# Patient Record
Sex: Female | Born: 1955 | Race: Black or African American | Hispanic: No | Marital: Married | Smoking: Never smoker
Health system: Southern US, Community
[De-identification: ages and names within clinical notes are randomized; demographics above are authoritative.]

## PROBLEM LIST (undated history)

## (undated) DIAGNOSIS — I10 Essential (primary) hypertension: Secondary | ICD-10-CM

## (undated) DIAGNOSIS — E119 Type 2 diabetes mellitus without complications: Secondary | ICD-10-CM

## (undated) DIAGNOSIS — C541 Malignant neoplasm of endometrium: Secondary | ICD-10-CM

## (undated) HISTORY — PX: TUBAL LIGATION: SHX77

## (undated) HISTORY — DX: Type 2 diabetes mellitus without complications: E11.9

## (undated) HISTORY — PX: ABDOMINAL HYSTERECTOMY: SHX81

---

## 2013-08-17 ENCOUNTER — Emergency Department (HOSPITAL_COMMUNITY)
Admission: EM | Admit: 2013-08-17 | Discharge: 2013-08-17 | Disposition: A | Discharge status: Home / Self Care | Payer: Medicaid Other | Source: Home / Self Care | Attending: Emergency Medicine | Admitting: Emergency Medicine

## 2013-08-17 ENCOUNTER — Encounter (HOSPITAL_COMMUNITY): Payer: Self-pay | Admitting: Emergency Medicine

## 2013-08-17 ENCOUNTER — Ambulatory Visit (HOSPITAL_COMMUNITY): Payer: Self-pay | Attending: Emergency Medicine

## 2013-08-17 DIAGNOSIS — M25559 Pain in unspecified hip: Secondary | ICD-10-CM

## 2013-08-17 MED ORDER — TRAMADOL HCL 50 MG PO TABS: 50.0000 mg | ORAL_TABLET | Freq: Four times a day (QID) | ORAL | Status: DC | PRN

## 2013-08-17 MED ORDER — METHYLPREDNISOLONE ACETATE 40 MG/ML IJ SUSP
40.0000 mg | Freq: Once | INTRAMUSCULAR | Status: AC
  Administered 2013-08-17: 40 mg via INTRA_ARTICULAR

## 2013-08-17 MED ORDER — NAPROXEN 500 MG PO TABS: 500.0000 mg | ORAL_TABLET | Freq: Two times a day (BID) | ORAL | Status: DC

## 2013-08-17 MED ORDER — METHYLPREDNISOLONE ACETATE 40 MG/ML IJ SUSP
INTRAMUSCULAR | Status: AC
  Filled 2013-08-17: qty 5

## 2013-08-17 MED ORDER — KETOROLAC TROMETHAMINE 60 MG/2ML IM SOLN
60.0000 mg | Freq: Once | INTRAMUSCULAR | Status: AC
  Administered 2013-08-17: 60 mg via INTRAMUSCULAR

## 2013-08-17 MED ORDER — KETOROLAC TROMETHAMINE 60 MG/2ML IM SOLN
INTRAMUSCULAR | Status: AC
  Filled 2013-08-17: qty 2

## 2013-08-17 NOTE — Discharge Instructions (Signed)
Hip Pain  The hips join the upper legs to the lower pelvis. The bones, cartilage, tendons, and muscles of the hip joint perform a lot of work each day holding your body weight and allowing you to move around.  Hip pain is a common symptom. It can range from a minor ache to severe pain on 1 or both hips. Pain may be felt on the inside of the hip joint near the groin, or the outside near the buttocks and upper thigh. There may be swelling or stiffness as well. It occurs more often when a person walks or performs activity. There are many reasons hip pain can develop.  CAUSES   It is important to work with your caregiver to identify the cause since many conditions can impact the bones, cartilage, muscles, and tendons of the hips. Causes for hip pain include:   Broken (fractured) bones.   Separation of the thighbone from the hip socket (dislocation).   Torn cartilage of the hip joint.   Swelling (inflammation) of a tendon (tendonitis), the sac within the hip joint (bursitis), or a joint.   A weakening in the abdominal wall (hernia), affecting the nerves to the hip.   Arthritis in the hip joint or lining of the hip joint.   Pinched nerves in the back, hip, or upper thigh.   A bulging disc in the spine (herniated disc).   Rarely, bone infection or cancer.  DIAGNOSIS   The location of your hip pain will help your caregiver understand what may be causing the pain. A diagnosis is based on your medical history, your symptoms, results from your physical exam, and results from diagnostic tests. Diagnostic tests may include X-ray exams, a computerized magnetic scan (magnetic resonance imaging, MRI), or bone scan.  TREATMENT   Treatment will depend on the cause of your hip pain. Treatment may include:   Limiting activities and resting until symptoms improve.   Crutches or other walking supports (a cane or brace).   Ice, elevation, and compression.   Physical therapy or home exercises.   Shoe inserts or special  shoes.   Losing weight.   Medications to reduce pain.   Undergoing surgery.  HOME CARE INSTRUCTIONS    Only take over-the-counter or prescription medicines for pain, discomfort, or fever as directed by your caregiver.   Put ice on the injured area:   Put ice in a plastic bag.   Place a towel between your skin and the bag.   Leave the ice on for 15-20 minutes at a time, 03-04 times a day.   Keep your leg raised (elevated) when possible to lessen swelling.   Avoid activities that cause pain.   Follow specific exercises as directed by your caregiver.   Sleep with a pillow between your legs on your most comfortable side.   Record how often you have hip pain, the location of the pain, and what it feels like. This information may be helpful to you and your caregiver.   Ask your caregiver about returning to work or sports and whether you should drive.   Follow up with your caregiver for further exams, therapy, or testing as directed.  SEEK MEDICAL CARE IF:    Your pain or swelling continues or worsens after 1 week.   You are feeling unwell or have chills.   You have increasing difficulty with walking.   You have a loss of sensation or other new symptoms.   You have questions or concerns.  SEEK   IMMEDIATE MEDICAL CARE IF:    You cannot put weight on the affected hip.   You have fallen.   You have a sudden increase in pain and swelling in your hip.   You have a fever.  MAKE SURE YOU:    Understand these instructions.   Will watch your condition.   Will get help right away if you are not doing well or get worse.  Document Released: 10/11/2009 Document Revised: 07/16/2011 Document Reviewed: 10/11/2009  [employer] Patient Information 2014 [employer].

## 2013-08-17 NOTE — ED Provider Notes (Signed)
Medical screening examination/treatment/procedure(s) were performed by non-physician practitioner and as supervising physician I was immediately available for consultation/collaboration.  Philipp Deputy, M.D.   Harden Mo, MD 08/17/13 [phone]

## 2013-08-17 NOTE — ED Provider Notes (Signed)
CSN: [id number]     Arrival date & time 08/17/13  0840 History   First MD Initiated Contact with Patient 08/17/13 0915     Chief Complaint  Patient presents with  . Hip Pain   (Consider location/radiation/quality/duration/timing/severity/associated sxs/prior Treatment) HPI Comments: 58 year old female presents complaining of left hip pain that began on Saturday. She has never had this before and she had no inciting injury. She just started to feel pain in her hip that is worse with rising from a seated position. She feels the pain in the left buttock around the left side of her leg and into her left groin. She has no history of arthritis. Pain is constant no matter what position she is in but just gets worse when she tries to stand on that leg. She also has some low back pain but it is actually better than it has been in the past. She denies any numbness, loss of bowel or bladder control.  Patient is a 58 y.o. female presenting with hip pain.  Hip Pain    History reviewed. No pertinent past medical history. History reviewed. No pertinent past surgical history. History reviewed. No pertinent family history. History  Substance Use Topics  . Smoking status: Never Smoker   . Smokeless tobacco: Not on file  . Alcohol Use: No   OB History   Grav Para Term Preterm Abortions TAB SAB Ect Mult Living                 Review of Systems  Musculoskeletal: Positive for arthralgias (left hip pain) and back pain.  All other systems reviewed and are negative.   Allergies  Review of patient's allergies indicates no known allergies.  Home Medications   Current Outpatient Rx  Name  Route  Sig  Dispense  Refill  . naproxen (NAPROSYN) 500 MG tablet   Oral   Take 1 tablet (500 mg total) by mouth 2 (two) times daily.   60 tablet   0   . traMADol (ULTRAM) 50 MG tablet   Oral   Take 1 tablet (50 mg total) by mouth every 6 (six) hours as needed.   15 tablet   0    BP 179/79  Pulse 58   Temp(Src) 97.8 F (36.6 C) (Oral)  Resp 20  SpO2 100% Physical Exam  Nursing note and vitals reviewed. Constitutional: She is oriented to person, place, and time. Vital signs are normal. She appears well-developed and well-nourished. No distress.  Obese habitus  HENT:  Head: Normocephalic and atraumatic.  Pulmonary/Chest: Effort normal. No respiratory distress.  Musculoskeletal:       Left hip: She exhibits decreased range of motion and tenderness (lateral hip over her trochanteric bursa). She exhibits normal strength and no swelling.  Neurological: She is alert and oriented to person, place, and time. She has normal strength and normal reflexes. No sensory deficit. Gait (antalgic, favoring left) abnormal. Coordination normal.  Skin: Skin is warm and dry. No rash noted. She is not diaphoretic.  Psychiatric: She has a normal mood and affect. Judgment normal.    ED Course  Procedures (including critical care time) Labs Review Labs Reviewed - No data to display Imaging Review Dg Hip Complete Left  08/17/2013   CLINICAL DATA:  Left hip pain.  EXAM: LEFT HIP - COMPLETE 2+ VIEW  COMPARISON:  None.  FINDINGS: There is no evidence of hip fracture or dislocation. There is no evidence of arthropathy or other focal bone abnormality.  IMPRESSION: Normal  left hip.   Electronically Signed   By: Sabino Dick M.D.   On: 08/17/2013 10:54    940: Given trochanteric bursa injection with 40 mg of Depo-Medrol and 3 mL of 2% lidocaine. After 20 minutes, this is only partially relieved her pain but she still has pain in the groin and buttock. Will obtain hip x-rays. Will give 60 mg of Toradol as well.   MDM   1. Hip pain    XR normal.  Probable mild arthritis, hopefully will improve with NSAIDs and weight loss.  F/u with ortho Dr. Percell Miller if this does not improve    Meds ordered this encounter  Medications  . methylPREDNISolone acetate (DEPO-MEDROL) injection 40 mg    Sig:   . ketorolac (TORADOL)  injection 60 mg    Sig:   . naproxen (NAPROSYN) 500 MG tablet    Sig: Take 1 tablet (500 mg total) by mouth 2 (two) times daily.    Dispense:  60 tablet    Refill:  0    Order Specific Question:  Supervising Provider    Answer:  Jake Michaelis, DAVID C [id number]  . traMADol (ULTRAM) 50 MG tablet    Sig: Take 1 tablet (50 mg total) by mouth every 6 (six) hours as needed.    Dispense:  15 tablet    Refill:  0    Order Specific Question:  Supervising Provider    Answer:  Jake Michaelis, DAVID C [[id number]]       Liam Graham, PA-C 08/17/13 1100

## 2013-08-17 NOTE — ED Notes (Signed)
C/o  Left hip pain since Saturday.  Denies injury.  Severe pain upon standing after lying.  Pt tried aleve with no relief.

## 2020-02-23 ENCOUNTER — Encounter: Payer: Self-pay | Admitting: Emergency Medicine

## 2020-02-23 ENCOUNTER — Other Ambulatory Visit: Payer: Self-pay

## 2020-02-23 ENCOUNTER — Ambulatory Visit
Admission: EM | Admit: 2020-02-23 | Discharge: 2020-02-23 | Disposition: A | Discharge status: Home / Self Care | Payer: Self-pay

## 2020-02-23 DIAGNOSIS — R319 Hematuria, unspecified: Secondary | ICD-10-CM

## 2020-02-23 DIAGNOSIS — R300 Dysuria: Secondary | ICD-10-CM

## 2020-02-23 DIAGNOSIS — R739 Hyperglycemia, unspecified: Secondary | ICD-10-CM

## 2020-02-23 DIAGNOSIS — M545 Low back pain, unspecified: Secondary | ICD-10-CM

## 2020-02-23 DIAGNOSIS — R1030 Lower abdominal pain, unspecified: Secondary | ICD-10-CM

## 2020-02-23 HISTORY — DX: Essential (primary) hypertension: I10

## 2020-02-23 LAB — POCT FASTING CBG KUC MANUAL ENTRY: POCT Glucose (KUC): 146 mg/dL — AB (ref 70–99)

## 2020-02-23 LAB — POCT URINALYSIS DIP (MANUAL ENTRY)
Glucose, UA: NEGATIVE mg/dL
Leukocytes, UA: NEGATIVE
Nitrite, UA: NEGATIVE
Protein Ur, POC: 100 mg/dL — AB
Spec Grav, UA: >=1.03 — AB (ref 1.010–1.025)
Urobilinogen, UA: 0.2 E.U./dL
pH, UA: 5.5 (ref 5.0–8.0)

## 2020-02-23 MED ORDER — KETOROLAC TROMETHAMINE 30 MG/ML IJ SOLN
30.0000 mg | Freq: Once | INTRAMUSCULAR | Status: AC
  Administered 2020-02-23: 30 mg via INTRAMUSCULAR

## 2020-02-23 NOTE — Discharge Instructions (Addendum)
Will call you with blood results - we may end up starting a medication. If your kidney function is poor you may need to go to the hospital for further treatment. Go to ER if you have worse pain, vomiting, fever, black/blood in stool. Important to schedule follow up with PCP as well as urology (pee/bladder doctor).

## 2020-02-23 NOTE — ED Triage Notes (Signed)
Pt here for right sided groin/lower abd pain worse with urination; pt sts noted blood in urine x 1 week; pt sts urinary frequency

## 2020-02-23 NOTE — ED Provider Notes (Signed)
[name]    CSN: [id number] Arrival date & time: 02/23/20  0902      History   Chief Complaint Chief Complaint  Patient presents with  . Dysuria  . Hematuria    HPI Amanda Lin is a 64 y.o. female  With history of obesity, hypertension presenting for bilateral lower abdominal pain that is worse with urination.  States she is noted blood in her urine off and on for the last week.  Denying vaginal discharge or pelvic pain, nausea, vomiting.  No upper abdominal pain, reflux.  Is s/p cholecystectomy.  No change in bowel habit: Last BM was last night and normal for her.  No blood or melena.  Denies personal history diabetes, though endorsing family history.    Past Medical History:  Diagnosis Date  . Hypertension     There are no problems to display for this patient.   History reviewed. No pertinent surgical history.  OB History   No obstetric history on file.      Home Medications    Prior to Admission medications   Medication Sig Start Date End Date Taking? Authorizing Provider  losartan (COZAAR) 25 MG tablet Take 25 mg by mouth daily.   Yes [provider]  naproxen (NAPROSYN) 500 MG tablet Take 1 tablet (500 mg total) by mouth 2 (two) times daily. 08/17/13   Amanda Graham, PA-C    Family History Family History  Family history unknown: Yes    Social History Social History   Tobacco Use  . Smoking status: Never Smoker  . Smokeless tobacco: Never Used  Substance Use Topics  . Alcohol use: No  . Drug use: No     Allergies   Patient has no known allergies.   Review of Systems As per HPI   Physical Exam Triage Vital Signs ED Triage Vitals  Enc Vitals Group     BP      Pulse      Resp      Temp      Temp src      SpO2      Weight      Height      Head Circumference      Peak Flow      Pain Score      Pain Loc      Pain Edu?      Excl. in [place]?    No data found.  Updated Vital Signs BP (!) 178/91  (BP Location: Left Arm)   Pulse 79   Temp 98.3 F (36.8 C) (Oral)   Resp 18   SpO2 97%   Visual Acuity Right Eye Distance:   Left Eye Distance:   Bilateral Distance:    Right Eye Near:   Left Eye Near:    Bilateral Near:     Physical Exam Constitutional:      General: She is not in acute distress. HENT:     Head: Normocephalic and atraumatic.  Eyes:     General: No scleral icterus.    Pupils: Pupils are equal, round, and reactive to light.  Cardiovascular:     Rate and Rhythm: Normal rate.  Pulmonary:     Effort: Pulmonary effort is normal.  Abdominal:     General: Bowel sounds are normal.     Palpations: Abdomen is soft.     Tenderness: There is no abdominal tenderness. There is no right CVA tenderness, left CVA tenderness or guarding.  Skin:  Coloration: Skin is not jaundiced or pale.  Neurological:     Mental Status: She is alert and oriented to person, place, and time.      UC Treatments / Results  Labs (all labs ordered are listed, but only abnormal results are displayed) Labs Reviewed  POCT URINALYSIS DIP (MANUAL ENTRY) - Abnormal; Notable for the following components:      Result Value   Clarity, UA cloudy (*)    Bilirubin, UA moderate (*)    Ketones, POC UA large (80) (*)    Spec Grav, UA >=1.030 (*)    Blood, UA large (*)    Protein Ur, POC =100 (*)    All other components within normal limits  POCT FASTING CBG [facility] MANUAL ENTRY - Abnormal; Notable for the following components:   POCT Glucose ([facility]) 146 (*)    All other components within normal limits  COMPREHENSIVE METABOLIC PANEL  HEMOGLOBIN A1C  CBC WITH DIFFERENTIAL/PLATELET    EKG   Radiology No results found.  Procedures Procedures (including critical care time)  Medications Ordered in UC Medications  ketorolac (TORADOL) 30 MG/ML injection 30 mg (30 mg Intramuscular Given 02/23/20 1027)    Initial Impression / Assessment and Plan / UC Course  I have reviewed the triage vital  signs and the nursing notes.  Pertinent labs & imaging results that were available during my care of the patient were reviewed by me and considered in my medical decision making (see chart for details).     Afebrile, nontoxic in office today.  Urine dipstick with abnormalities as above.  CBG 146 (fasting).  Low concern for infectious process at this time.  Will obtain basic lab work, start Metformin if A1c, sugars elevated and kidney function ok.  F/u w/ PCP.  ER return precautions discussed, pt verbalized understanding and is agreeable to plan.  Final Clinical Impressions(s) / UC Diagnoses   Final diagnoses:  Hyperglycemia  Dysuria  Hematuria, unspecified type  Acute bilateral low back pain without sciatica  Lower abdominal pain     Discharge Instructions     Will call you with blood results - we may end up starting a medication. If your kidney function is poor you may need to go to the hospital for further treatment. Go to ER if you have worse pain, vomiting, fever, black/blood in stool. Important to schedule follow up with PCP as well as urology (pee/bladder doctor).    ED Prescriptions    None     PDMP not reviewed this encounter.   Amanda Lin, [place], [place] 02/23/20 1307

## 2020-02-25 LAB — COMPREHENSIVE METABOLIC PANEL
ALT: 19 IU/L (ref 0–32)
AST: 14 IU/L (ref 0–40)
Albumin/Globulin Ratio: 1.3 (ref 1.2–2.2)
Albumin: 4.4 g/dL (ref 3.8–4.8)
Alkaline Phosphatase: 115 IU/L (ref 44–121)
BUN/Creatinine Ratio: 19 (ref 12–28)
BUN: 15 mg/dL (ref 8–27)
Bilirubin Total: 0.6 mg/dL (ref 0.0–1.2)
CO2: 19 mmol/L — ABNORMAL LOW (ref 20–29)
Calcium: 9.6 mg/dL (ref 8.7–10.3)
Chloride: 102 mmol/L (ref 96–106)
Creatinine, Ser: 0.8 mg/dL (ref 0.57–1.00)
GFR calc Af Amer: 91 mL/min/{1.73_m2} (ref >59)
GFR calc non Af Amer: 79 mL/min/{1.73_m2} (ref >59)
Globulin, Total: 3.4 g/dL (ref 1.5–4.5)
Glucose: 138 mg/dL — ABNORMAL HIGH (ref 65–99)
Potassium: 4.2 mmol/L (ref 3.5–5.2)
Sodium: 138 mmol/L (ref 134–144)
Total Protein: 7.8 g/dL (ref 6.0–8.5)

## 2020-02-25 LAB — CBC WITH DIFFERENTIAL/PLATELET
Basophils Absolute: 0 10*3/uL (ref 0.0–0.2)
Basos: 0 %
EOS (ABSOLUTE): 0.1 10*3/uL (ref 0.0–0.4)
Eos: 1 %
Hematocrit: 45.1 % (ref 34.0–46.6)
Hemoglobin: 15.1 g/dL (ref 11.1–15.9)
Immature Grans (Abs): 0 10*3/uL (ref 0.0–0.1)
Immature Granulocytes: 0 %
Lymphocytes Absolute: 1.7 10*3/uL (ref 0.7–3.1)
Lymphs: 18 %
MCH: 27.7 pg (ref 26.6–33.0)
MCHC: 33.5 g/dL (ref 31.5–35.7)
MCV: 83 fL (ref 79–97)
Monocytes Absolute: 0.8 10*3/uL (ref 0.1–0.9)
Monocytes: 8 %
Neutrophils Absolute: 7 10*3/uL (ref 1.4–7.0)
Neutrophils: 73 %
Platelets: 292 10*3/uL (ref 150–450)
RBC: 5.46 x10E6/uL — ABNORMAL HIGH (ref 3.77–5.28)
RDW: 12.6 % (ref 11.7–15.4)
WBC: 9.6 10*3/uL (ref 3.4–10.8)

## 2020-02-25 LAB — HEMOGLOBIN A1C
Est. average glucose Bld gHb Est-mCnc: 180 mg/dL
Hgb A1c MFr Bld: 7.9 % — ABNORMAL HIGH (ref 4.8–5.6)

## 2020-02-26 ENCOUNTER — Emergency Department (HOSPITAL_COMMUNITY)
Admission: EM | Admit: 2020-02-26 | Discharge: 2020-02-26 | Disposition: A | Discharge status: Home / Self Care | Payer: Self-pay | Attending: Emergency Medicine | Admitting: Emergency Medicine

## 2020-02-26 ENCOUNTER — Other Ambulatory Visit: Payer: Self-pay

## 2020-02-26 ENCOUNTER — Encounter (HOSPITAL_COMMUNITY): Payer: Self-pay | Admitting: Pharmacy Technician

## 2020-02-26 ENCOUNTER — Emergency Department (HOSPITAL_COMMUNITY): Payer: Self-pay

## 2020-02-26 DIAGNOSIS — R319 Hematuria, unspecified: Secondary | ICD-10-CM | POA: Diagnosis present

## 2020-02-26 DIAGNOSIS — Z79899 Other long term (current) drug therapy: Secondary | ICD-10-CM | POA: Insufficient documentation

## 2020-02-26 DIAGNOSIS — E119 Type 2 diabetes mellitus without complications: Secondary | ICD-10-CM | POA: Insufficient documentation

## 2020-02-26 DIAGNOSIS — N2889 Other specified disorders of kidney and ureter: Secondary | ICD-10-CM | POA: Insufficient documentation

## 2020-02-26 DIAGNOSIS — N200 Calculus of kidney: Secondary | ICD-10-CM | POA: Insufficient documentation

## 2020-02-26 DIAGNOSIS — I10 Essential (primary) hypertension: Secondary | ICD-10-CM | POA: Insufficient documentation

## 2020-02-26 LAB — CBC
HCT: 45.3 % (ref 36.0–46.0)
Hemoglobin: 14.4 g/dL (ref 12.0–15.0)
MCH: 27.4 pg (ref 26.0–34.0)
MCHC: 31.8 g/dL (ref 30.0–36.0)
MCV: 86.3 fL (ref 80.0–100.0)
Platelets: 391 10*3/uL (ref 150–400)
RBC: 5.25 MIL/uL — ABNORMAL HIGH (ref 3.87–5.11)
RDW: 12.8 % (ref 11.5–15.5)
WBC: 7.9 10*3/uL (ref 4.0–10.5)
nRBC: 0 % (ref 0.0–0.2)

## 2020-02-26 LAB — COMPREHENSIVE METABOLIC PANEL
ALT: 15 U/L (ref 0–44)
AST: 23 U/L (ref 15–41)
Albumin: 3.7 g/dL (ref 3.5–5.0)
Alkaline Phosphatase: 83 U/L (ref 38–126)
Anion gap: 10 (ref 5–15)
BUN: 12 mg/dL (ref 8–23)
CO2: 25 mmol/L (ref 22–32)
Calcium: 9.3 mg/dL (ref 8.9–10.3)
Chloride: 101 mmol/L (ref 98–111)
Creatinine, Ser: 0.72 mg/dL (ref 0.44–1.00)
GFR, Estimated: >60 mL/min (ref >60)
Glucose, Bld: 136 mg/dL — ABNORMAL HIGH (ref 70–99)
Potassium: 4.3 mmol/L (ref 3.5–5.1)
Sodium: 136 mmol/L (ref 135–145)
Total Bilirubin: 1.3 mg/dL — ABNORMAL HIGH (ref 0.3–1.2)
Total Protein: 7.9 g/dL (ref 6.5–8.1)

## 2020-02-26 LAB — URINALYSIS, ROUTINE W REFLEX MICROSCOPIC
Bacteria, UA: NONE SEEN
Bilirubin Urine: NEGATIVE
Glucose, UA: NEGATIVE mg/dL
Ketones, ur: NEGATIVE mg/dL
Leukocytes,Ua: NEGATIVE
Nitrite: NEGATIVE
Protein, ur: NEGATIVE mg/dL
Specific Gravity, Urine: 1.001 — ABNORMAL LOW (ref 1.005–1.030)
pH: 5 (ref 5.0–8.0)

## 2020-02-26 LAB — LIPASE, BLOOD: Lipase: 36 U/L (ref 11–51)

## 2020-02-26 NOTE — ED Provider Notes (Signed)
[facility] Provider Note   CSN: [id number] Arrival date & time: 02/26/20  0944     History Chief Complaint  Patient presents with  . Abdominal Pain    Amanda Lin is a 64 y.o. female with past medical history of hypertension, diabetes who presents to the ED for hematuria, dysuria and bilateral lower abdominal pain.  Patient states that for some started a few days ago which she went to an urgent care on 10/19/121.  CBC and CMP were unremarkable.  UA shows large blood with negative nitrite and leukocyte esterase.  She was not given antibiotics at that time.  Patient states that since then her hematuria and dysuria has improved but not resolved.  She still have the bilateral lower abdominal pain but also improved.  She denies fever, flank pain, urgency, nausea, or vomiting.  Patient is postmenopausal and does not have menstrual cycle.  She is not sexually active.  She denies any radiation in her pelvic area.  HPI     Past Medical History:  Diagnosis Date  . Hypertension     Patient Active Problem List   Diagnosis Date Noted  . Right renal mass 02/26/2020  . Kidney stone 02/26/2020  . Hematuria 02/26/2020    Past Surgical History:  Procedure Laterality Date  . ABDOMINAL HYSTERECTOMY     pt has had tubes tied     OB History   No obstetric history on file.     Family History  Family history unknown: Yes    Social History   Tobacco Use  . Smoking status: Never Smoker  . Smokeless tobacco: Never Used  Substance Use Topics  . Alcohol use: Yes    Comment: ocassionaly   . Drug use: No    Home Medications Prior to Admission medications   Medication Sig Start Date End Date Taking? Authorizing Provider  losartan (COZAAR) 25 MG tablet Take 25 mg by mouth daily.    [provider]  naproxen (NAPROSYN) 500 MG tablet Take 1 tablet (500 mg total) by mouth 2 (two) times daily. 08/17/13   Liam Graham, PA-C     Allergies    Patient has no known allergies.  Review of Systems   Review of Systems  Constitutional: Positive for chills. Negative for fever.  Respiratory: Negative for shortness of breath.   Cardiovascular: Negative for chest pain.  Gastrointestinal: Positive for abdominal pain. Negative for nausea and vomiting.  Genitourinary: Positive for difficulty urinating, dysuria and hematuria. Negative for flank pain and urgency.  Musculoskeletal: Positive for back pain.    Physical Exam Updated Vital Signs BP (!) 191/76 (BP Location: Right Arm)   Pulse 72   Temp 98.3 F (36.8 C) (Oral)   Resp 15   Ht 5\' 1"  (1.549 m)   Wt 117.9 kg   SpO2 99%   BMI 49.13 kg/m   Physical Exam Constitutional:      General: She is not in acute distress.    Appearance: She is obese. She is not toxic-appearing.  HENT:     Head: Normocephalic.  Eyes:     General:        Right eye: No discharge.        Left eye: No discharge.  Cardiovascular:     Rate and Rhythm: Normal rate and regular rhythm.  Pulmonary:     Effort: No respiratory distress.     Breath sounds: Normal breath sounds. No wheezing.  Abdominal:  General: Bowel sounds are normal.     Palpations: Abdomen is soft.     Tenderness: There is no right CVA tenderness or left CVA tenderness.     Comments: Tenderness to palpation of bilateral lower abdomen radiates down to her groin.  Also suprapubic tenderness to palpation.  Musculoskeletal:     Right lower leg: No edema.     Left lower leg: No edema.  Skin:    General: Skin is warm.     Coloration: Skin is not jaundiced.  Neurological:     Mental Status: She is alert.  Psychiatric:        Mood and Affect: Mood normal.     ED Results / Procedures / Treatments   Labs (all labs ordered are listed, but only abnormal results are displayed) Labs Reviewed  COMPREHENSIVE METABOLIC PANEL - Abnormal; Notable for the following components:      Result Value   Glucose, Bld 136 (*)     Total Bilirubin 1.3 (*)    All other components within normal limits  CBC - Abnormal; Notable for the following components:   RBC 5.25 (*)    All other components within normal limits  URINALYSIS, ROUTINE W REFLEX MICROSCOPIC - Abnormal; Notable for the following components:   Specific Gravity, Urine 1.001 (*)    Hgb urine dipstick MODERATE (*)    All other components within normal limits  LIPASE, BLOOD    EKG None  Radiology CT Renal Stone Study  Result Date: 02/26/2020 CLINICAL DATA:  Hematuria and lower abdominal pain. EXAM: CT ABDOMEN AND PELVIS WITHOUT CONTRAST TECHNIQUE: Multidetector CT imaging of the abdomen and pelvis was performed following the standard protocol without IV contrast. COMPARISON:  None. FINDINGS: Lower chest: No acute abnormality. Hepatobiliary: No focal liver abnormality is seen. Status post cholecystectomy. No biliary dilatation. Pancreas: Unremarkable. No pancreatic ductal dilatation or surrounding inflammatory changes. Spleen: Normal in size without focal abnormality. Adrenals/Urinary Tract: Adrenal glands are unremarkable. Kidneys are normal in size. A 1.3 cm diameter exophytic hyperdense area is seen along the lower pole of the right kidney. A 2.1 cm x 1.8 cm x 2.5 cm slightly heterogeneous low-attenuation mass is seen along the anterior aspect of the lower pole of the left kidney. A 3 mm nonobstructing renal stone is seen within the left kidney. The urinary bladder is contracted and subsequently limited in evaluation. Stomach/Bowel: Stomach is within normal limits. Appendix appears normal. No evidence of bowel wall thickening, distention, or inflammatory changes. Noninflamed diverticula are seen within the descending and sigmoid colon. Vascular/Lymphatic: No significant vascular findings are present. No enlarged abdominal or pelvic lymph nodes. Reproductive: The uterus is mildly enlarged, with multiple round, exophytic isodense and mildly hyperdense uterine soft  tissue mass is seen. A small area of parenchymal calcification is also seen within the uterus. Other: There is a small lobulated fat containing umbilical hernia. No abdominopelvic ascites. Musculoskeletal: No acute or significant osseous findings. IMPRESSION: 1. 3 mm nonobstructing renal stone within the left kidney. 2. Findings suggestive of a small hemorrhagic cyst within the lower pole of the right kidney, with an anterior right lower pole heterogeneous renal soft tissue mass. MRI correlation is recommended, as an underlying neoplastic process cannot be excluded. 3. Colonic diverticulosis. 4. Multiple exophytic uterine fibroids. 5. Evidence of prior cholecystectomy. Electronically Signed   By: Virgina Norfolk M.D.   On: 02/26/2020 18:57    Procedures Procedures (including critical care time)  Medications Ordered in ED Medications - No data to  display  ED Course  I have reviewed the triage vital signs and the nursing notes.  Pertinent labs & imaging results that were available during my care of the patient were reviewed by me and considered in my medical decision making (see chart for details).  Patient seen and examined.  Physical exam remarkable for bilateral lower abdominal pain bradycardia down to the groin with suprapubic tenderness.  No CVA tenderness.  CBC and CMP were unremarkable and negative for infectious process.  Lipase was negative.  UA shows moderate hemoglobin with negative nitrite and leukocyte esterase.  Differentials include renal cholelithiasis, malignancy or interstitial cystitis.  Will obtain CT renal stone.   CT shows a 3 mm nonobstructing stone in the left kidney.  Right kidney shows a hemorrhagic cyst at the lower pole and a small soft tissue mass anteriorly.  Recommend MRI follow-up.  Her source of bleeding likely come from these findings.  Patient will be discharged and follow-up with a urologist and proceed with an MRI outpatient.    MDM Rules/Calculators/A&P                           Patient presented to the ED for ongoing hematuria and abdominal pain radiates to groin for the duration of 1 week.  No CVA tenderness to palpation on exam.  CBC and CMP were unremarkable.  Lipase was negative.  UA is negative for nitrite or leukocyte esterase but showed moderate hemoglobin.  CT renal stone was ordered which show a 3 mm nonobstructing stone in the left kidney.  Right kidney shows a hemorrhagic cyst at the lower pole and a small soft tissue mass anteriorly.  Her hematuria likely come from these findings.  Her abdominal pain may be due to passing a kidney stone.  Patient will follow up with a urologist and obtain an MRI outpatient for further evaluation.  She will need to find a PCP of her choice.  Come back to ED for worsening pain or bleeding.  Patient agrees with the plan.  Final Clinical Impression(s) / ED Diagnoses Final diagnoses:  Hematuria, unspecified type  Kidney stone  Right renal mass    Rx / DC Orders ED Discharge Orders    None       Gaylan Gerold, DO 02/26/20 1949    Blanchie Dessert, MD 02/26/20 2311

## 2020-02-26 NOTE — Discharge Instructions (Addendum)
Ms. Khun, you came to the ED for blood in urine and abdominal pain.  CT scan shows a small stone in your left kidney.  The right kidney has a small blood containing cyst and a soft tissue mass.  [facility] for further evaluation.  Please drink plenty of fluid.  Please find a primary physician of your choice.  Come back to the ED for worsening pain or bleeding.  Take care  CT scan IMPRESSION: 1. 3 mm nonobstructing renal stone within the left kidney. 2. Findings suggestive of a small hemorrhagic cyst within the lower pole of the right kidney, with an anterior right lower pole heterogeneous renal soft tissue mass. MRI correlation is recommended, as an underlying neoplastic process cannot be excluded. 3. Colonic diverticulosis. 4. Multiple exophytic uterine fibroids. 5. Evidence of prior cholecystectomy.

## 2020-02-26 NOTE — ED Triage Notes (Signed)
Pt here from UC with reports of hematuria, bil lower abd pain and burning with urination. Taking AZO for pain relief. Pt states given "shot" at [facility] but not sure what they gave her.

## 2020-03-17 ENCOUNTER — Other Ambulatory Visit: Payer: Self-pay | Admitting: Urology

## 2020-03-17 DIAGNOSIS — R310 Gross hematuria: Secondary | ICD-10-CM

## 2020-03-17 DIAGNOSIS — D49511 Neoplasm of unspecified behavior of right kidney: Secondary | ICD-10-CM

## 2020-03-28 ENCOUNTER — Ambulatory Visit (HOSPITAL_COMMUNITY)
Admission: RE | Admit: 2020-03-28 | Discharge: 2020-03-28 | Disposition: A | Discharge status: Home / Self Care | Payer: Self-pay | Source: Ambulatory Visit | Attending: Urology | Admitting: Urology

## 2020-03-28 ENCOUNTER — Encounter (HOSPITAL_COMMUNITY): Payer: Self-pay

## 2020-03-28 ENCOUNTER — Other Ambulatory Visit: Payer: Self-pay

## 2020-03-28 DIAGNOSIS — D49511 Neoplasm of unspecified behavior of right kidney: Secondary | ICD-10-CM | POA: Insufficient documentation

## 2020-03-28 DIAGNOSIS — R310 Gross hematuria: Secondary | ICD-10-CM | POA: Insufficient documentation

## 2020-03-28 MED ORDER — IOHEXOL 300 MG/ML  SOLN
100.0000 mL | Freq: Once | INTRAMUSCULAR | Status: AC | PRN
  Administered 2020-03-28: 100 mL via INTRAVENOUS

## 2020-04-04 ENCOUNTER — Encounter (HOSPITAL_COMMUNITY): Payer: Self-pay

## 2020-04-04 ENCOUNTER — Other Ambulatory Visit: Payer: Self-pay

## 2020-04-04 ENCOUNTER — Emergency Department (HOSPITAL_COMMUNITY): Payer: Self-pay

## 2020-04-04 ENCOUNTER — Emergency Department (HOSPITAL_COMMUNITY)
Admission: EM | Admit: 2020-04-04 | Discharge: 2020-04-04 | Disposition: A | Discharge status: Home / Self Care | Payer: Self-pay | Attending: Emergency Medicine | Admitting: Emergency Medicine

## 2020-04-04 DIAGNOSIS — N939 Abnormal uterine and vaginal bleeding, unspecified: Secondary | ICD-10-CM | POA: Insufficient documentation

## 2020-04-04 DIAGNOSIS — R102 Pelvic and perineal pain: Secondary | ICD-10-CM

## 2020-04-04 DIAGNOSIS — R300 Dysuria: Secondary | ICD-10-CM | POA: Insufficient documentation

## 2020-04-04 DIAGNOSIS — I10 Essential (primary) hypertension: Secondary | ICD-10-CM | POA: Insufficient documentation

## 2020-04-04 LAB — CBC WITH DIFFERENTIAL/PLATELET
Abs Immature Granulocytes: 0.03 10*3/uL (ref 0.00–0.07)
Basophils Absolute: 0 10*3/uL (ref 0.0–0.1)
Basophils Relative: 0 %
Eosinophils Absolute: 0.1 10*3/uL (ref 0.0–0.5)
Eosinophils Relative: 1 %
HCT: 37.8 % (ref 36.0–46.0)
Hemoglobin: 11.7 g/dL — ABNORMAL LOW (ref 12.0–15.0)
Immature Granulocytes: 0 %
Lymphocytes Relative: 21 %
Lymphs Abs: 1.8 10*3/uL (ref 0.7–4.0)
MCH: 26.5 pg (ref 26.0–34.0)
MCHC: 31 g/dL (ref 30.0–36.0)
MCV: 85.5 fL (ref 80.0–100.0)
Monocytes Absolute: 0.7 10*3/uL (ref 0.1–1.0)
Monocytes Relative: 8 %
Neutro Abs: 6 10*3/uL (ref 1.7–7.7)
Neutrophils Relative %: 70 %
Platelets: 413 10*3/uL — ABNORMAL HIGH (ref 150–400)
RBC: 4.42 MIL/uL (ref 3.87–5.11)
RDW: 13.6 % (ref 11.5–15.5)
WBC: 8.6 10*3/uL (ref 4.0–10.5)
nRBC: 0 % (ref 0.0–0.2)

## 2020-04-04 LAB — COMPREHENSIVE METABOLIC PANEL
ALT: 13 U/L (ref 0–44)
AST: 13 U/L — ABNORMAL LOW (ref 15–41)
Albumin: 3.7 g/dL (ref 3.5–5.0)
Alkaline Phosphatase: 70 U/L (ref 38–126)
Anion gap: 13 (ref 5–15)
BUN: 10 mg/dL (ref 8–23)
CO2: 22 mmol/L (ref 22–32)
Calcium: 9.5 mg/dL (ref 8.9–10.3)
Chloride: 105 mmol/L (ref 98–111)
Creatinine, Ser: 0.77 mg/dL (ref 0.44–1.00)
GFR, Estimated: >60 mL/min (ref >60)
Glucose, Bld: 134 mg/dL — ABNORMAL HIGH (ref 70–99)
Potassium: 3.3 mmol/L — ABNORMAL LOW (ref 3.5–5.1)
Sodium: 140 mmol/L (ref 135–145)
Total Bilirubin: 0.7 mg/dL (ref 0.3–1.2)
Total Protein: 7.2 g/dL (ref 6.5–8.1)

## 2020-04-04 NOTE — ED Notes (Signed)
Pt back from [facility]

## 2020-04-04 NOTE — ED Notes (Signed)
Pelvic exam done by Shawn - PA and Lavella Lemons - EMT assisted.

## 2020-04-04 NOTE — ED Provider Notes (Signed)
[facility] EMERGENCY DEPARTMENT Provider Note   CSN: [id number] Arrival date & time: 04/04/20  8299     History Chief Complaint  Patient presents with   Vaginal Bleeding    Amanda Lin is a 64 y.o. female.  HPI      Amanda Lin is a 64 y.o. female, with a history of HTN and obesity, presenting to the ED with vaginal bleeding for about the past month. Intermittent lower abdominal cramping that is minor.  She thinks she uses about 2 pads a day on average, however, some days she has only spotting and some days she will have clots.  She at first thought the bleeding was urinary, she additionally had some dysuria, so therefore a UTI was suspected.  After being seen in the ED, she was told to follow-up with urology.  Upon urology follow-up, she states, "they used a scope to look at my bladder and said that the bleeding was not coming from the bladder."  "They advised that I go to OB/GYN, but when I tried to go they told me it would be $350.  So urology told me to go back to the emergency room." She states urology sent her for CT of the abdomen on November 22, however, she has not yet received the results from the scan.  Denies fever/chills, difficulty urinating, chest pain, shortness of breath, N/V/C/D, or any other complaints.   Past Medical History:  Diagnosis Date   Hypertension     Patient Active Problem List   Diagnosis Date Noted   Right renal mass 02/26/2020   Kidney stone 02/26/2020   Hematuria 02/26/2020    Past Surgical History:  Procedure Laterality Date   ABDOMINAL HYSTERECTOMY     pt has had tubes tied     OB History   No obstetric history on file.     Family History  Family history unknown: Yes    Social History   Tobacco Use   Smoking status: Never Smoker   Smokeless tobacco: Never Used  Substance Use Topics   Alcohol use: Yes    Comment: ocassionaly    Drug use: No    Home Medications Prior to  Admission medications   Medication Sig Start Date End Date Taking? Authorizing Provider  [employer] PAIN RELIEF 4 % CREA Apply 1 application topically daily as needed (pain).  12/03/19  Yes [provider]  diclofenac Sodium (VOLTAREN) 1 % GEL Apply 1 application topically daily as needed (pain).  12/15/19  Yes [provider]  losartan (COZAAR) 25 MG tablet Take 25 mg by mouth daily.   Yes [provider]  meloxicam (MOBIC) 7.5 MG tablet Take 7.5 mg by mouth daily as needed for pain.  03/06/20  Yes [provider]  Naproxen Sodium (ALEVE) 220 MG CAPS Take 660 mg by mouth daily as needed (pain).   Yes [provider]    Allergies    Patient has no known allergies.  Review of Systems   Review of Systems  Constitutional: Negative for chills, diaphoresis and fever.  Respiratory: Negative for cough and shortness of breath.   Cardiovascular: Negative for chest pain.  Gastrointestinal: Negative for diarrhea, nausea and vomiting.       Occasional abdominal cramping  Genitourinary: Positive for dysuria and vaginal bleeding. Negative for difficulty urinating.  Musculoskeletal: Positive for back pain.  All other systems reviewed and are negative.   Physical Exam Updated Vital Signs BP (!) 170/67 (BP Location: Left  Arm)    Pulse 68    Temp 98.3 F (36.8 C) (Oral)    Resp 17    Ht 5\' 1"  (1.549 m)    Wt 113.4 kg    SpO2 97%    BMI 47.24 kg/m   Physical Exam Vitals and nursing note reviewed.  Constitutional:      General: She is not in acute distress.    Appearance: She is well-developed. She is obese. She is not diaphoretic.  HENT:     Head: Normocephalic and atraumatic.     Mouth/Throat:     Mouth: Mucous membranes are moist.     Pharynx: Oropharynx is clear.  Eyes:     Conjunctiva/sclera: Conjunctivae normal.  Cardiovascular:     Rate and Rhythm: Normal rate and regular rhythm.     Pulses: Normal pulses.          Radial pulses are 2+ on the right  side and 2+ on the left side.       Posterior tibial pulses are 2+ on the right side and 2+ on the left side.     Comments: Tactile temperature in the extremities appropriate and equal bilaterally. Pulmonary:     Effort: Pulmonary effort is normal. No respiratory distress.  Abdominal:     Palpations: Abdomen is soft.     Tenderness: There is no abdominal tenderness. There is no guarding.  Genitourinary:    Comments: External genitalia normal Vagina without discharge Cervix  abnormal  - cervical friability with oozing hemorrhage.  No brisk bleeding. negative for cervical motion tenderness Adnexa palpated, no masses, negative for tenderness noted Bladder palpated negative for tenderness Uterus palpated no masses, negative for tenderness  No inguinal lymphadenopathy. Otherwise normal female genitalia. [place], [place], served as chaperone during exam. Musculoskeletal:     Cervical back: Neck supple.  Skin:    General: Skin is warm and dry.  Neurological:     Mental Status: She is alert.  Psychiatric:        Mood and Affect: Mood and affect normal.        Speech: Speech normal.        Behavior: Behavior normal.     ED Results / Procedures / Treatments   Labs (all labs ordered are listed, but only abnormal results are displayed) Labs Reviewed  COMPREHENSIVE METABOLIC PANEL - Abnormal; Notable for the following components:      Result Value   Potassium 3.3 (*)    Glucose, Bld 134 (*)    AST 13 (*)    All other components within normal limits  CBC WITH DIFFERENTIAL/PLATELET - Abnormal; Notable for the following components:   Hemoglobin 11.7 (*)    Platelets 413 (*)    All other components within normal limits    EKG None  Radiology No results found.  Procedures Procedures (including critical care time)  Medications Ordered in ED Medications - No data to display  ED Course  I have reviewed the triage vital signs and the nursing notes.  Pertinent labs & imaging  results that were available during my care of the patient were reviewed by me and considered in my medical decision making (see chart for details).  Clinical Course as of Apr 04 1540  Mon Apr 04, 2020  1311 Spoke with Dr. Ilda Basset, OBGYN. We discussed the patient's symptoms, previous imaging in the form of CT scan. He states a pelvic ultrasound would be helpful as part of her work-up, but otherwise she can  follow-up with OB/GYN in the office in the next week or 2. He will send an inbasket message to the clinic scheduling department to get her an appointment.   [SJ]  2 Spoke with Dr. Claudia Lin, urology.  We discussed the results for the CT performed November 22.  She states patient has an appointment scheduled for December 9 to discuss these results and they do not need to be addressed here in the ED.  There is nothing further that needs to be done out of the emergency department.   [SJ]    Clinical Course User Index [SJ] Audree Schrecengost, Helane Gunther, PA-C   MDM Rules/Calculators/A&P                          Patient presents with persistent vaginal bleeding for about the past month. Patient is nontoxic appearing, afebrile, not tachycardic on my exam, not tachypneic, not hypotensive, maintains excellent SPO2 on room air, and is in no apparent distress.  Benign abdominal exam.  I have reviewed the patient's chart to obtain more information.   I reviewed and interpreted the patient's labs and radiological studies. She does have a decrease in her hemoglobin from 14.4 a month ago to 11.7 today.  She does not seem to be symptomatic to this change.  We discussed this change as well as reasons related to this that would require return to the emergency department.  Findings and plan of care discussed with Deno Etienne, DO.    End of shift patient care handoff report given to Darrick Huntsman, EM resident.  Plan: [place] pending. Expect patient can be discharged following [place] results.   Vitals:   04/04/20 0933 04/04/20  1120 04/04/20 1253 04/04/20 1449  BP: (!) 203/68 (!) 170/67 (!) 185/76 (!) 162/62  Pulse: (!) 102 68 73 70  Resp: 20 17 18 16   Temp: 98.3 F (36.8 C)  98.7 F (37.1 C) 97.7 F (36.5 C)  TempSrc: Oral  Oral Oral  SpO2: 96% 97% 100% 100%  Weight:      Height:         Final Clinical Impression(s) / ED Diagnoses Final diagnoses:  Vaginal bleeding    Rx / DC Orders ED Discharge Orders    None       Layla Maw 04/04/20 [place], [place], DO 04/05/20 [phone]

## 2020-04-04 NOTE — ED Notes (Signed)
Pt transported to US at this time. 

## 2020-04-04 NOTE — ED Provider Notes (Signed)
Patient care assumed from [facility] at 1500.   Presents with post-menopausal bleeding x1 month. Pelvic exam with friable cervix and bleeding but no uncontrolled/significant hemorrhage. Hemoglobin 2.7 drop in 1 month, asymptomatic, discussed with patient; appropriate with outpatient follow-up. CT by Urology 11/22 read with possible RCC. Urology would prefer to patient in office to discuss and preferred patient not be informed in ED.  Plan: -F/u U/S -Likely discharge with OB/Gyn to set up follow-up  Physical Exam  BP (!) 162/62 (BP Location: Right Arm)    Pulse 70    Temp 97.7 F (36.5 C) (Oral)    Resp 16    Ht 5\' 1"  (1.549 m)    Wt 113.4 kg    SpO2 100%    BMI 47.24 kg/m   Physical Exam Constitutional:      General: She is not in acute distress.    Appearance: Normal appearance. She is not ill-appearing.  Pulmonary:     Effort: No respiratory distress.  Neurological:     Mental Status: She is alert.     ED Course/Procedures   Clinical Course as of Apr 04 1854  Mon Apr 04, 2020  1311 Spoke with Dr. Ilda Basset, OBGYN. We discussed the patient's symptoms, previous imaging in the form of CT scan. He states a pelvic ultrasound would be helpful as part of her work-up, but otherwise she can follow-up with OB/GYN in the office in the next week or 2. He will send an inbasket message to the clinic scheduling department to get her an appointment.   [SJ]  35 Spoke with Dr. Claudia Desanctis, urology.  We discussed the results for the CT performed November 22.  She states patient has an appointment scheduled for December 9 to discuss these results and they do not need to be addressed here in the ED.  There is nothing further that needs to be done out of the emergency department.   [SJ]    Clinical Course User Index [SJ] Joy, Shawn C, PA-C    Procedures  MDM   Pelvic ultrasound with a 10 mm endometrial stripe that is considered abnormal in postmenopausal females with bleeding.  Radiology  recommending endometrial sampling which can be done outpatient with OB/gyne.  Patient also has multiple uterine fibroids.  Results of pelvic ultrasound including endometrial stripe and fibroids discussed with patient who voiced understanding.  Also discussed importance of outpatient follow-up with OB/GYN, patient voiced understanding.  Discussed taking multivitamin for her mild hypokalemia and having PCP repeat blood work to ensure that her potassium is improving.  Also discussed her hypertension and importance of taking medications at home as well as follow-up with the PCP regarding this.  Patient voiced understanding of discussion.  All questions answered.  Strict return precautions discussed.  Discharged in stable condition.        Darrick Huntsman, MD 04/04/20 Milon Dikes    Noemi Chapel, MD 04/06/20 Curly Rim

## 2020-04-04 NOTE — ED Triage Notes (Signed)
Pt reports 2 weeks of vaginal bleeding. Pt seen by urologist 2 weeks ago for concern of hemturia but they discovered the bleeding was vaginally. Pt tried to schedule an appointment with an OBGYN but could not afford it. Pt reports the bleeding is heavy at times but not all the time. Intermittent abd cramping.

## 2020-04-04 NOTE — Discharge Instructions (Addendum)
  Recommend follow-up with OB/GYN on this matter.  If their office does not contact you by tomorrow morning, please give them a call.  There is an appointment scheduled for you with urology on December 9.  Please call to confirm this appointment.  Return to the emergency department for chest pain, shortness of breath, dizziness, passing out, increased abdominal pain, vaginal bleeding using more than one pad an hour, or any other major concerns.

## 2020-04-19 ENCOUNTER — Other Ambulatory Visit: Payer: Self-pay

## 2020-04-19 ENCOUNTER — Encounter: Payer: Self-pay | Admitting: Obstetrics and Gynecology

## 2020-04-19 ENCOUNTER — Ambulatory Visit (INDEPENDENT_AMBULATORY_CARE_PROVIDER_SITE_OTHER): Payer: Self-pay | Admitting: Obstetrics and Gynecology

## 2020-04-19 ENCOUNTER — Other Ambulatory Visit (HOSPITAL_COMMUNITY)
Admission: RE | Admit: 2020-04-19 | Discharge: 2020-04-19 | Disposition: A | Discharge status: Home / Self Care | Payer: Self-pay | Source: Ambulatory Visit | Attending: Obstetrics and Gynecology | Admitting: Obstetrics and Gynecology

## 2020-04-19 VITALS — BP 178/100 | HR 111 | Wt 260.6 lb

## 2020-04-19 DIAGNOSIS — Z124 Encounter for screening for malignant neoplasm of cervix: Secondary | ICD-10-CM | POA: Insufficient documentation

## 2020-04-19 DIAGNOSIS — I10 Essential (primary) hypertension: Secondary | ICD-10-CM

## 2020-04-19 DIAGNOSIS — N950 Postmenopausal bleeding: Secondary | ICD-10-CM

## 2020-04-19 DIAGNOSIS — R300 Dysuria: Secondary | ICD-10-CM

## 2020-04-19 DIAGNOSIS — Z1231 Encounter for screening mammogram for malignant neoplasm of breast: Secondary | ICD-10-CM | POA: Insufficient documentation

## 2020-04-19 DIAGNOSIS — R030 Elevated blood-pressure reading, without diagnosis of hypertension: Secondary | ICD-10-CM

## 2020-04-19 NOTE — Patient Instructions (Signed)
Postmenopausal Bleeding  Postmenopausal bleeding is any bleeding that occurs after menopause. Menopause is when a woman's period stops. Any type of bleeding after menopause should be checked by your doctor. Treatment will depend on the cause. Follow these instructions at home:  Pay attention to any changes in your symptoms.  Avoid using tampons and douches as told by your doctor.  Change your pads regularly.  Get regular pelvic exams and Pap tests.  Take iron pills as told by your doctor.  Take over-the-counter and prescription medicines only as told by your doctor.  Keep all follow-up visits as told by your doctor. This is important. Contact a doctor if:  Your bleeding lasts for more than 1 week.  You have pain in your belly (abdomen).  You have bleeding during or after sex.  You have bleeding that happens more often than every 3 weeks. Get help right away if:  You have fever, chills, headache, dizziness, muscle aches, or bleeding.  You have very bad pain with bleeding.  You have clumps of blood (blood clots) coming from your vagina.  You have a lot of bleeding, and: ? You use more than 1 pad an hour. ? This kind of bleeding has never happened before.  You feel like you are going to pass out (faint). Summary  Any type of bleeding after menopause should be checked by your doctor.  Pay attention to any changes in your symptoms.  Keep all follow-up visits as told by your doctor. This information is not intended to replace advice given to you by your health care provider. Make sure you discuss any questions you have with your health care provider. Document Revised: 07/10/2018 Document Reviewed: 05/29/2016 Elsevier Patient Education  2020 [employer]  

## 2020-04-19 NOTE — Progress Notes (Signed)
GYNECOLOGY OFFICE VISIT NOTE  History:  Amanda Lin is a 64 y.o. female here today for concern of post-menopausal bleeding since 02/2020. Pt reports menopause in late 29s. She was last seen in the [facility] ED on 04/04/20 at which time her endometrium was noted to be thickened to 45mm on transvaginal ultrasound. In addition, multiple fibroids visualized. Hgb 11.7 on evaluation in ED. She has had ongoing blood clots from vagina since her last ED visit. Some days it is light and other days the bleeding is "like a flood". Sometimes needs to change pads multiple times a day. No current blood thinners. Only medication is meloxicam for knee pain.  She denies any abnormal vaginal discharge, pelvic pain or other concerns. No current PCP. Her [employer] will start 05/2020 (no current health insurance). Currently taking losartan for high blood pressure. Recent A1c 7.9%; pt aware of new diagnosis of diabetes. No current medications for diabetes. Pt has mass on her kidney (appointment with Urology on 05/2020).  No lightheadedness or dizziness. Pt endorses ongoing dysuria but denies back pain. No fever, nausea or vomiting. Good appetite and lots of water intake. Low energy at times but able to do her housework. No recent weight gain or loss. Last pap in 2008 with a normal result. No recent mammogram. Going back and forth between [place] and [place] to help family, especially after her mother passed. Pt with family history of uterine cancer (aunt) and possible breast cancer (mother). Pt declines STI testing today. Lives with her husband. She is not currently working.  Pt completed financial assistance paperwork today.  Past Medical History:  Diagnosis Date   Hypertension     Past Surgical History:  Procedure Laterality Date   ABDOMINAL HYSTERECTOMY     pt has had tubes tied     Current Outpatient Medications:    [employer] PAIN RELIEF 4 % CREA, Apply 1 application topically daily as needed (pain). ,  Disp: , Rfl:    diclofenac Sodium (VOLTAREN) 1 % GEL, Apply 1 application topically daily as needed (pain). , Disp: , Rfl:    losartan (COZAAR) 25 MG tablet, Take 25 mg by mouth daily., Disp: , Rfl:    meloxicam (MOBIC) 7.5 MG tablet, Take 7.5 mg by mouth daily as needed for pain. , Disp: , Rfl:    Naproxen Sodium 220 MG CAPS, Take 660 mg by mouth daily as needed (pain)., Disp: , Rfl:   The following portions of the patient's history were reviewed and updated as appropriate: allergies, current medications, past family history, past medical history, past social history, past surgical history and problem list.   Health Maintenance:  Last pap: pt unable to report last pap but no history of abnormal results Last mammogram: pt unable to report date of last mammogram  Review of Systems:  Pertinent items noted in HPI and remainder of comprehensive ROS otherwise negative.   Objective:  Physical Exam BP (!) 178/100    Pulse (!) 111    Wt 260 lb 9.6 oz (118.2 kg)    BMI 49.24 kg/m  CONSTITUTIONAL: Well-developed, well-nourished female in no acute distress.  HENT:  Normocephalic, atraumatic. External right and left ear normal. Oropharynx is clear and moist EYES: Conjunctivae and EOM are normal. Pupils are equal, round, and reactive to light. No scleral icterus.  NECK: Normal range of motion, supple, no masses SKIN: Skin is warm and dry. No rash noted. Not diaphoretic. No erythema. No pallor. NEUROLOGIC: Alert and oriented to person, place,  and time. Normal reflexes, muscle tone coordination. No cranial nerve deficit noted. PSYCHIATRIC: Normal mood and affect. Normal behavior. Normal judgment and thought content. CARDIOVASCULAR: Normal heart rate noted. RESPIRATORY: Normal effort. ABDOMEN: Soft, no distention noted. No palpable masses. PELVIC: Normal appearing external genitalia; normal appearing vaginal mucosa. Age-appropriate stenosis of external os but irregular, bumpy surface to palpable  with digital sterile digital cervical exam. No abnormal discharge noted.  Pap smear and endometrial biopsy obtained.  No pelvic cultures obtained per patient preference. Normal uterine size, no other palpable masses, no uterine or adnexal tenderness. MUSCULOSKELETAL: Normal range of motion. No edema noted.  Exam done with chaperone present.  Labs and Imaging CT ABDOMEN PELVIS W WO CONTRAST  Result Date: 03/29/2020 CLINICAL DATA:  Follow-up renal mass, hematuria EXAM: CT ABDOMEN AND PELVIS WITHOUT AND WITH CONTRAST TECHNIQUE: Multidetector CT imaging of the abdomen and pelvis was performed following the standard protocol before and following the bolus administration of intravenous contrast. CONTRAST:  175mL OMNIPAQUE IOHEXOL 300 MG/ML  SOLN COMPARISON:  02/26/2020 FINDINGS: Lower chest: 4 x 5 mm nodule in the medial right lower lobe (series 3/image 18), unchanged. Hepatobiliary: Liver is within normal limits, noting a Riedel's lobe configuration. No suspicious/enhancing hepatic lesions. Status post cholecystectomy. No intrahepatic or extrahepatic ductal dilatation. Pancreas: Within normal limits. Spleen: Within normal limits. Adrenals/Urinary Tract: Adrenal glands are within normal limits. 2.0 x 2.4 cm enhancing mass in the anterior right lower kidney (series 11/image 60), suspicious for solid renal neoplasm such as renal cell carcinoma. 1.5 cm hyperdense lesion in the posterior right lower kidney (series 2/image 62), compatible with a benign hemorrhagic cyst. Additional subcentimeter left renal cysts. 3 mm nonobstructing left lower pole renal calculus (series 2/image 47). No hydronephrosis. Bladder is underdistended but unremarkable. Stomach/Bowel: Stomach is notable for a tiny hiatal hernia. No evidence of bowel obstruction. Normal appendix (series 11/image 83). Vascular/Lymphatic: No evidence of abdominal aortic aneurysm. Mild atherosclerotic calcifications of the abdominal aorta. Single right renal  artery and vein. No renal vein invasion. 9 mm short axis left para-aortic node (series 11/image 51) and 8 mm short axis aortocaval node (series 11/image 82), mildly prominent but technically within the upper limits of normal. Reproductive: Uterine fibroids. Bilateral ovaries are within normal limits. Other: No abdominopelvic ascites. Postsurgical changes along the anterior abdominal wall. Musculoskeletal: Degenerative changes of the lower thoracic spine. No focal osseous lesions. IMPRESSION: 2.4 cm enhancing mass in the anterior right lower kidney, suspicious for solid renal neoplasm such as renal cell carcinoma. Single right renal artery and vein.  No renal vein invasion. Small retroperitoneal nodes, measuring up to 9 mm short axis, mildly prominent but technically within the upper limits of normal. Attention on follow-up is suggested. 4 x 5 mm nodule in the medial right lower lobe. This is nonspecific and unlikely to reflect a metastasis but warrants attention on follow-up. Consider dedicated CT chest in 3-6 months. 3 mm nonobstructing left lower pole renal calculus. No hydronephrosis. Electronically Signed   By: Julian Hy M.D.   On: 03/29/2020 09:59   US PELVIC COMPLETE WITH TRANSVAGINAL  Result Date: 04/04/2020 CLINICAL DATA:  Pelvic pain.  Postmenopausal bleeding. EXAM: TRANSABDOMINAL AND TRANSVAGINAL ULTRASOUND OF PELVIS TECHNIQUE: Both transabdominal and transvaginal ultrasound examinations of the pelvis were performed. Transabdominal technique was performed for global imaging of the pelvis including uterus, ovaries, adnexal regions, and pelvic cul-de-sac. It was necessary to proceed with endovaginal exam following the transabdominal exam to visualize the endometrium. COMPARISON:  None FINDINGS: Uterus Measurements:  11.5 x 6.8 x 6.9 cm = volume: 282 mL. Multiple fibroids evident including potential submucosal fibroid in the lower uterine segment measuring 2.9 cm. Additional lesions near the  fundus measure up to 3.5 cm under likely intramural. Endometrium Thickness: 10 mm.  No focal abnormality visualized. Right ovary Measurements: 4.2 x 2.4 x 3.4 cm = volume: 18 mL. Normal appearance/no adnexal mass. Left ovary Measurements: Not visualized.  No adnexal mass evident. Other findings No free fluid. IMPRESSION: 1. 10 mm endometrial stripe thickness. This is considered abnormal in a postmenopausal female with bleeding. In the setting of post-menopausal bleeding, endometrial sampling is indicated to exclude carcinoma. If results are benign, sonohysterogram should be considered for focal lesion work-up. (Ref: Radiological Reasoning: Algorithmic Workup of Abnormal Vaginal Bleeding with Endovaginal Sonography and Sonohysterography. AJR 2008; [id number]:D97-41) 2. Multiple uterine fibroids including potential submucosal fibroid in the lower uterine segment. Electronically Signed   By: Misty Stanley M.D.   On: 04/04/2020 17:10    Assessment & Plan:   Amanda Lin is a 64 year old female who presented today for concern of post-menopausal bleeding, worsening for the past several months in addition to concern of dysuria.  1. Postmenopausal bleeding: Pt presents for concern of heavy, irregular post-menopausal bleeding since 02/2020. Pt reports menopause in late 42s. She was last seen in the [facility] ED on 04/04/20 at which time her endometrium was noted to be thickened to 53mm on transvaginal ultrasound. In addition, multiple fibroids visualized. Hgb 11.7 on evaluation in ED. She has had ongoing blood clots from vagina since her last ED visit. Some days it is light and other days the bleeding is "like a flood". Sometimes needs to change pads multiple times a day. No current blood thinners. Concern for possible malignancy given family history, pt's age, recent ultrasound findings and symptoms. Pt reports fatigue at times but no other symptoms of anemia. Initially pt tachycardic but HR normalized on recheck. Pt declined  STI testing and preferred to defer blood work given recent ED visit. - after shared decision making with pt, she preferred to perform pap and endometrial biopsy today with plan to f/u with OBGYN in 2022 given no current health insurance until 05/2020 - discussed strict return precautions for worsening bleeding, extreme fatigue, chest pain, difficulty breathing, or other concerns - called and left voice message for pt on 12/15 to check in & offered lab + nursing visit in 1 week to check vitals and repeat labs (also sent in-basket message to office staff to call pt to schedule appt for close follow-up)  2. Pap smear for cervical cancer screening: Stenosis of external os and bumpy irregularity to surface of cervix on exam. No recent pap performed but no reported history of abnormal results. - f/u Pap result collected today  3. Screening mammogram for breast cancer: No recent mammograms per pt report. Family history of breast cancer. - placed referral for mammogram today  4. Elevated Blood Pressure   Hypertension: Pt with elevated blood pressure in clinic today but no report of chest pain, dyspnea or other concerning symptoms. Pt reports use of losartan but does not have a current PCP.  - emphasized need to call promptly to schedule appt given uncontrolled hypertension - strict ED precautions provided  5. Dysuria: Pt reports ongoing dysuria. Pt did not leave a urine sample prior to leaving the clinic. Pt declined STI testing today. - provided strict return precautions for fever, extreme fatigue or worsening dysuria - called pt on 12/16 to schedule lab +  nurse visit as noted above (recommend urine culture if persistent symptoms)  Routine preventative health maintenance measures emphasized. Please refer to After Visit Summary for other counseling recommendations.  Return in about 1 month (around 05/20/2020) for needs f/u appt for post-menopausal bleeding with OBGYN physician.  ENDOMETRIAL BIOPSY      The indications for endometrial biopsy were reviewed.  Risks of the biopsy including cramping, bleeding, infection, uterine perforation, inadequate specimen and need for additional procedures  were discussed. The patient states she understands and agrees to undergo procedure today. Consent was signed. Time out was performed. Urine HCG was not collected given pt's age and post-menopausal status.  During the pelvic exam, the cervix was prepped with Betadine. A single-toothed tenaculum was placed on the anterior lip of the cervix to stabilize it. The 3 mm pipelle was introduced into the endometrial cavity to a depth of 5-6 cm, and a moderate amount of tissue was obtained and sent to pathology. The instruments were removed from the patient's vagina. Minimal bleeding from the cervix was noted. The patient tolerated the procedure well. Routine post-procedure instructions were given to the patient.  Randa Ngo, MD OB Fellow, Faculty [facility] 04/19/2020 1:51 PM

## 2020-04-20 DIAGNOSIS — R300 Dysuria: Secondary | ICD-10-CM | POA: Insufficient documentation

## 2020-04-24 ENCOUNTER — Telehealth: Payer: Self-pay | Admitting: Obstetrics and Gynecology

## 2020-04-24 DIAGNOSIS — C541 Malignant neoplasm of endometrium: Secondary | ICD-10-CM

## 2020-04-24 NOTE — Telephone Encounter (Signed)
Attempted to reach pt by phone given finding of high grade carcinoma on endometrial biopsy. Left message urging pt to please call our clinic back on 12/21 given need for prompt follow-up given the result of her biopsy. Also placed urgent referral to [facility] and also to [employer]. Admin pool at [facility] also notified of need to get pt scheduled ASAP.  Randa Ngo, MD OB Fellow, Faculty [facility] 04/24/2020 9:08 AM

## 2020-04-25 ENCOUNTER — Other Ambulatory Visit: Payer: Self-pay

## 2020-04-25 ENCOUNTER — Ambulatory Visit (INDEPENDENT_AMBULATORY_CARE_PROVIDER_SITE_OTHER): Payer: Self-pay | Admitting: Obstetrics & Gynecology

## 2020-04-25 VITALS — BP 207/86 | HR 79

## 2020-04-25 DIAGNOSIS — N950 Postmenopausal bleeding: Secondary | ICD-10-CM

## 2020-04-25 DIAGNOSIS — C541 Malignant neoplasm of endometrium: Secondary | ICD-10-CM | POA: Insufficient documentation

## 2020-04-25 DIAGNOSIS — R300 Dysuria: Secondary | ICD-10-CM

## 2020-04-25 LAB — CYTOLOGY - PAP
Comment: NEGATIVE
High risk HPV: NEGATIVE

## 2020-04-25 NOTE — Progress Notes (Signed)
Patient reports taking Losartan 25mg  1 tab daily but did take three tablets today because she knows her blood pressure has been high. Patient also reports continued dysuria- urine has been sent for culture.

## 2020-04-25 NOTE — Patient Instructions (Signed)
[facility] Address: [address], [place], [place] [address] Phone: [phone]  Appt 04/28/2020 at 11:15AM.  Please arrive 30 minutes early.

## 2020-04-25 NOTE — Progress Notes (Signed)
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  Appt made with gyn/onc for 04/28/2020 at 11:15am.  Daughter with pt today and they know where to go and be there 30 minutes early.    Will work to try and get her with PCP earlier.  Advised to start her losartin 50mg  daily.    Also, CT showed renal mass concerning for malignancy.  Pt has appt scheduled 05/13/2019 at [facility].  Stressed importance of this.

## 2020-04-26 ENCOUNTER — Telehealth: Payer: Self-pay

## 2020-04-26 LAB — CBC
Hematocrit: 37.1 % (ref 34.0–46.6)
Hemoglobin: 11.6 g/dL (ref 11.1–15.9)
MCH: 25.3 pg — ABNORMAL LOW (ref 26.6–33.0)
MCHC: 31.3 g/dL — ABNORMAL LOW (ref 31.5–35.7)
MCV: 81 fL (ref 79–97)
Platelets: 555 10*3/uL — ABNORMAL HIGH (ref 150–450)
RBC: 4.59 x10E6/uL (ref 3.77–5.28)
RDW: 13.1 % (ref 11.7–15.4)
WBC: 9.7 10*3/uL (ref 3.4–10.8)

## 2020-04-26 NOTE — Telephone Encounter (Signed)
TC to patient to review meaningful use information.  No answer, left message to return call.   

## 2020-04-28 ENCOUNTER — Telehealth: Payer: Self-pay | Admitting: Oncology

## 2020-04-28 ENCOUNTER — Other Ambulatory Visit: Payer: Self-pay

## 2020-04-28 ENCOUNTER — Encounter: Payer: Self-pay | Admitting: Gynecologic Oncology

## 2020-04-28 ENCOUNTER — Inpatient Hospital Stay: Payer: Self-pay | Attending: Gynecologic Oncology | Admitting: Gynecologic Oncology

## 2020-04-28 ENCOUNTER — Encounter: Payer: Self-pay | Admitting: Oncology

## 2020-04-28 VITALS — BP 196/72 | HR 81 | Temp 97.0°F | Resp 16 | Ht 61.0 in | Wt 256.4 lb

## 2020-04-28 DIAGNOSIS — N888 Other specified noninflammatory disorders of cervix uteri: Secondary | ICD-10-CM | POA: Insufficient documentation

## 2020-04-28 DIAGNOSIS — Z79899 Other long term (current) drug therapy: Secondary | ICD-10-CM | POA: Insufficient documentation

## 2020-04-28 DIAGNOSIS — C541 Malignant neoplasm of endometrium: Secondary | ICD-10-CM | POA: Insufficient documentation

## 2020-04-28 DIAGNOSIS — E1165 Type 2 diabetes mellitus with hyperglycemia: Secondary | ICD-10-CM | POA: Insufficient documentation

## 2020-04-28 DIAGNOSIS — N2889 Other specified disorders of kidney and ureter: Secondary | ICD-10-CM | POA: Insufficient documentation

## 2020-04-28 NOTE — Telephone Encounter (Signed)
[facility] and spoke to [place], [place].  She said patient will be seeing a resident on 05/13/19.  She is going to send a note to Dr. Louis Meckel to see what the plan is for the kidney mass seen on CT from 03/28/20 and he should be contacting us.

## 2020-04-28 NOTE — Progress Notes (Signed)
Consult Note: Gyn-Onc  Consult was requested by Dr. Astrid Drafts for the evaluation of Amanda Lin. [place] 64 y.o. female  CC:  Chief Complaint  Patient presents with  . Endometrial carcinoma [facility])    Assessment/Plan:  Ms. Amanda Lin. [name]  is a 64 y.o.  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  I explained to [name] and her daughter that the recommended treatment course is primary chemoradiation.  We will discuss her treatment course at multidisciplinary conference.  While we typically offer extrafascial hysterectomy after complete response for patients with stage II disease, I suspect that Ms. Pattee will be a poor candidate for this option given her extreme morbid obesity and short stature and poorly controlled diabetes.  Instead I favor definitive radiation with but intracavitary and external beam radiation.  We will assist her in achieving consultation with the primary care physician for establishment of diabetes management.  We will follow-up with her urologist to determine what intervention is planned for her renal mass.  This intervention may interrupt our plans to commence therapy for her locally advanced endometrial cancer.   HPI: [name] is a 64 year old P2 who was seen in consultation at the request of Dr Astrid Drafts for evaluation of high-grade endometrial cancer.  The patient reported having postmenopausal bleeding that she felt was hematuria in early October 2021.  She denied having bleeding symptoms prior to that.  She did not have an established primary care physician and therefore was taken to the emergency room by her daughter and worked up for hematuria in the ER.  This included a CT scan of the abdomen and pelvis performed on 02/26/2020.  The CT scan revealed a 2.1 x 1.8 x 2.5 cm heterogeneous low-attenuation mass in the anterior aspect of the lower pole of the left kidney.  The uterus was mildly enlarged with  multiple round exophytic isodense lesions consistent with fibroids.  No lymphadenopathy was identified.  She followed up with urology, Dr. Harold Barban, for the renal mass and a CT scan of the abdomen and pelvis without contrast was identified and this demonstrated a 2.4 cm anterior right lower renal mass suspicious for solid neoplasm such as renal cell carcinoma.  She had follow-up discussion scheduled for early January 2022 to determine treatment plan.  Dr. Milford Cage performed cystoscopy and evaluated imaging and felt she needed to be evaluated by gynecology.  She was seen by Dr. Astrid Drafts on 04/19/2020.  At that visit her cervix was noted to be nodular and firm and abnormal appearing.  Pap taken at that visit was positive for adenocarcinoma, endometrial.  An endometrial Pipelle biopsy was taken at the same visit which revealed high-grade endometrial cancer, suspicious for serous carcinoma.  Transvaginal ultrasound scan was performed on 04/04/2020 and revealed a uterus measuring 11.5 x 6.8 x 6.9 cm with multiple fibroids and an endometrial thickness of 10 mm.  The right ovary was normal.  The left ovary was not visualized.  Her medical history is most significant for a lack of medical care.  She denied having a primary care doctor.  She had not seen a gynecologist since the delivery of her children many decades previously.  She had been diagnosed with diabetes based on a hemoglobin A1c in October 2021 (this was 7.9%), however had not received follow-up care for this as she did not have a primary care doctor or endocrinologist and therefore was not receiving any treatment.  In the past she  had been diagnosed with hypertension but had no treating physician for this.  Her surgical history is most significant for a tubal ligation that was performed in the postpartum..  Her gynecologic history is remarkable for two prior vaginal deliveries, [facility] gynecologic care.  Her family cancer history is  significant for mother with history of uterine cancer at age 42, father with history of prostate cancer and a paternal aunt with a history of uterine cancer and a paternal cousin with a history of prostate cancer.  She does not work outside the home. She lives with her husband who is independent, and lives with her adult son.  Her [place]   Current Meds:  Outpatient Encounter Medications as of 04/28/2020  Medication Sig  . [employer] PAIN RELIEF 4 % CREA Apply 1 application topically daily as needed (pain).   Marland Kitchen diclofenac Sodium (VOLTAREN) 1 % GEL Apply 1 application topically daily as needed (pain).   . meloxicam (MOBIC) 7.5 MG tablet Take 7.5 mg by mouth daily as needed for pain.   . Naproxen Sodium 220 MG CAPS Take 660 mg by mouth daily as needed (pain).  Marland Kitchen losartan (COZAAR) 50 MG tablet Take 50 mg by mouth daily.  . [DISCONTINUED] losartan (COZAAR) 25 MG tablet Take 25 mg by mouth daily.   No facility-administered encounter medications on file as of 04/28/2020.    Allergy: No Known Allergies  Social Hx:   Social History   Socioeconomic History  . Marital status: Single    Spouse name: Not on file  . Number of children: Not on file  . Years of education: Not on file  . Highest education level: Not on file  Occupational History  . Not on file  Tobacco Use  . Smoking status: Never Smoker  . Smokeless tobacco: Never Used  Vaping Use  . Vaping Use: Never used  Substance and Sexual Activity  . Alcohol use: Yes    Comment: ocassionally  . Drug use: No  . Sexual activity: Yes  Other Topics Concern  . Not on file  Social History Narrative  . Not on file   Social Determinants of Health   Financial Resource Strain: Not on file  Food Insecurity: Food Insecurity Present  . Worried About Charity fundraiser in the Last Year: Often true  . Ran Out of Food in the Last Year: Sometimes true  Transportation Needs: No Transportation Needs  . Lack of  Transportation (Medical): No  . Lack of Transportation (Non-Medical): No  Physical Activity: Not on file  Stress: Not on file  Social Connections: Not on file  Intimate Partner Violence: Not on file    Past Surgical Hx:  Past Surgical History:  Procedure Laterality Date  . TUBAL LIGATION Bilateral     Past Medical Hx:  Past Medical History:  Diagnosis Date  . Hypertension     Past Gynecological History: See HPI, significant for two prior vaginal deliveries No LMP recorded. Patient is postmenopausal.  Family Hx:  Family History  Family history unknown: Yes    Review of Systems:  Constitutional  Feels well,    ENT Normal appearing ears and nares bilaterally Skin/Breast  No rash, sores, jaundice, itching, dryness Cardiovascular  No chest pain, shortness of breath, or edema  Pulmonary  No cough or wheeze.  Gastro Intestinal  No nausea, vomitting, or diarrhoea. No bright red blood per rectum, no abdominal pain, change in bowel movement, or constipation.  Genito Urinary  No frequency, urgency, dysuria, + vaginal bleeding and hematuria Musculo Skeletal  No myalgia, arthralgia, joint swelling or pain  Neurologic  No weakness, numbness, change in gait,  Psychology  No depression, anxiety, insomnia.   Vitals:  Blood pressure (!) 196/72, pulse 81, temperature (!) 97 F (36.1 C), temperature source Tympanic, resp. rate 16, height 5\' 1"  (1.549 m), weight 256 lb 6.4 oz (116.3 kg), SpO2 99 %.  Physical Exam: WD in NAD Neck  Supple NROM, without any enlargements.  Lymph Node Survey No cervical supraclavicular or inguinal adenopathy Cardiovascular  Peripheries well perfused Lungs  No increased WOB.   Skin  No rash/lesions/breakdown  Psychiatry  Alert and oriented to person, place, and time  Abdomen  Normoactive bowel sounds, abdomen soft, non-tender and obese without evidence of hernia. Large pannus. Rotund abdomen. Short waisted. Back No CVA tenderness Genito  Urinary  Vulva/vagina: Normal external female genitalia.  No lesions. No discharge or bleeding.  Bladder/urethra:  No lesions or masses, well supported bladder  Vagina: smooth, no gross lesions.  Cervix: Pale, nodular, very firm to palpate, slightly enlarged, consistent with cervical involvement of endometrial cancer.  Uterus: Difficult to discern size of uterus due to body habitus.  Fixed, the appreciation of parametrial extension was present.   Adnexa: No discretely palpable masses. Rectal  Good tone, no masses no cul de sac nodularity.  Extremities  No bilateral cyanosis, clubbing or edema.  60 minutes of total time was spent for this patient encounter, including preparation, face-to-face counseling with the patient and coordination of care, review of imaging (results and images), communication with the referring provider and documentation of the encounter.   Thereasa Solo, MD  04/28/2020, 4:59 PM

## 2020-04-28 NOTE — Telephone Encounter (Signed)
Left a message with appointment to see Dr. Alvy Bimler on 05/17/20 at 1:40 (arrival at 12:50, bring a family member).  Requested a return call to confirm.

## 2020-04-28 NOTE — Patient Instructions (Signed)
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 This means that it cannot be removed surgically, and needs to be treated with chemotherapy and radiation.   Dr Denman George will set you up to see the medical oncologist (chemotherapy) and the radiation oncologist (radiation). After your cancer has been treated with these treatments, a decision will be made as to whether you are a hysterectomy is necessary.   Dr Serita Grit office will follow-up with the kidney doctor to determine if there is any treatment needed for the kidney lesion.  Dr Serita Grit office will help set you up to see a doctor to treat your diabetes.

## 2020-04-30 LAB — URINE CULTURE

## 2020-05-01 ENCOUNTER — Telehealth: Payer: Self-pay | Admitting: Obstetrics and Gynecology

## 2020-05-01 DIAGNOSIS — B9620 Unspecified Escherichia coli [E. coli] as the cause of diseases classified elsewhere: Secondary | ICD-10-CM

## 2020-05-01 DIAGNOSIS — N390 Urinary tract infection, site not specified: Secondary | ICD-10-CM

## 2020-05-01 MED ORDER — NITROFURANTOIN MONOHYD MACRO 100 MG PO CAPS: 100.0000 mg | ORAL_CAPSULE | Freq: Two times a day (BID) | ORAL | 0 refills | Status: AC

## 2020-05-01 NOTE — Telephone Encounter (Signed)
Called pt to inform her of positive Urine culture for E. Coli in setting of dysuria at recent clinic visit. Unable to leave message so sent in-basket message to clinical call pool at [facility] to communicate results. Script for nitrofurantoin sent to pt's preferred pharmacy.   Randa Ngo, MD OB Fellow, Faculty Practice 05/01/2020 4:35 PM

## 2020-05-02 ENCOUNTER — Telehealth: Payer: Self-pay | Admitting: *Deleted

## 2020-05-02 ENCOUNTER — Encounter: Payer: Self-pay | Admitting: Oncology

## 2020-05-02 ENCOUNTER — Telehealth: Payer: Self-pay | Admitting: Oncology

## 2020-05-02 LAB — SURGICAL PATHOLOGY

## 2020-05-02 NOTE — Progress Notes (Signed)
Requested Her2 testing on accession [id number] with [facility] via email.

## 2020-05-02 NOTE — Telephone Encounter (Signed)
Amanda Lin called and said she has an appointment with [facility] on 05/12/20 at 1 pm at [facility].  Rescheduled appointment with Dr. Bertis Ruddy to 05/17/20 at 1:20 pm.

## 2020-05-02 NOTE — Telephone Encounter (Addendum)
-----   Message from Randa Ngo, MD sent at 05/01/2020  4:35 PM EST ----- Regarding: Please call pt re: +UCx >Macrobid sent to pt's preferred pharmacy Attempted to call pt on 12/26 regarding +Ucx in the setting of reported dysuria at recent clinic appt. Unable to leave a message but sent script for macrobid to pt's preferred pharmacy. Please attempt to call pt again regarding result.  Thanks!  Amanda Lin  12/27  1524  Called pt and left message stating that I am calling with test result information. Please call back and leave a message on the nurse voicemail stating whether a detailed message can be left regarding her results.   12/27  1705  Spoke w/pt after she returned my call. I informed her of +UTI and medication has been sent to her pharmacy. Dosing instructions given. Pt was advised to contact our office if she continues to have sx once the course of medication is complete. She voiced understanding.

## 2020-05-02 NOTE — Telephone Encounter (Signed)
Left a message with new appointment time to see Dr. Bertis Ruddy on 05/12/20 at 2 pm.  Requested a return call to confirm.  Also left a message with her daughter with appointment date and time.

## 2020-05-02 NOTE — Telephone Encounter (Signed)
Amanda Lin (daughter) called back and confirmed appointment on 05/12/20 with Dr. Bertis Ruddy.

## 2020-05-03 LAB — SURGICAL PATHOLOGY

## 2020-05-10 ENCOUNTER — Telehealth: Payer: Self-pay | Admitting: Oncology

## 2020-05-10 NOTE — Telephone Encounter (Signed)
Amanda Lin and rescheduled her appointment with Dr. Bertis Ruddy to tomorrow at 2:00 - arrival at 1:30.  She verbalized understanding and agreement.

## 2020-05-11 ENCOUNTER — Inpatient Hospital Stay: Payer: 59

## 2020-05-11 ENCOUNTER — Inpatient Hospital Stay: Payer: 59 | Attending: Gynecologic Oncology | Admitting: Hematology and Oncology

## 2020-05-11 ENCOUNTER — Other Ambulatory Visit: Payer: Self-pay

## 2020-05-11 ENCOUNTER — Encounter: Payer: Self-pay | Admitting: Hematology and Oncology

## 2020-05-11 VITALS — BP 170/74 | HR 89 | Temp 97.8°F | Resp 18 | Ht 61.0 in | Wt 259.8 lb

## 2020-05-11 DIAGNOSIS — K5909 Other constipation: Secondary | ICD-10-CM | POA: Diagnosis not present

## 2020-05-11 DIAGNOSIS — Z79899 Other long term (current) drug therapy: Secondary | ICD-10-CM | POA: Insufficient documentation

## 2020-05-11 DIAGNOSIS — G893 Neoplasm related pain (acute) (chronic): Secondary | ICD-10-CM | POA: Diagnosis not present

## 2020-05-11 DIAGNOSIS — B9620 Unspecified Escherichia coli [E. coli] as the cause of diseases classified elsewhere: Secondary | ICD-10-CM | POA: Diagnosis not present

## 2020-05-11 DIAGNOSIS — D539 Nutritional anemia, unspecified: Secondary | ICD-10-CM | POA: Diagnosis not present

## 2020-05-11 DIAGNOSIS — D4100 Neoplasm of uncertain behavior of unspecified kidney: Secondary | ICD-10-CM | POA: Diagnosis not present

## 2020-05-11 DIAGNOSIS — R300 Dysuria: Secondary | ICD-10-CM

## 2020-05-11 DIAGNOSIS — R911 Solitary pulmonary nodule: Secondary | ICD-10-CM | POA: Insufficient documentation

## 2020-05-11 DIAGNOSIS — I10 Essential (primary) hypertension: Secondary | ICD-10-CM

## 2020-05-11 DIAGNOSIS — R030 Elevated blood-pressure reading, without diagnosis of hypertension: Secondary | ICD-10-CM

## 2020-05-11 DIAGNOSIS — E119 Type 2 diabetes mellitus without complications: Secondary | ICD-10-CM | POA: Diagnosis not present

## 2020-05-11 DIAGNOSIS — C7800 Secondary malignant neoplasm of unspecified lung: Secondary | ICD-10-CM | POA: Diagnosis not present

## 2020-05-11 DIAGNOSIS — Z23 Encounter for immunization: Secondary | ICD-10-CM | POA: Diagnosis not present

## 2020-05-11 DIAGNOSIS — N390 Urinary tract infection, site not specified: Secondary | ICD-10-CM | POA: Insufficient documentation

## 2020-05-11 DIAGNOSIS — C541 Malignant neoplasm of endometrium: Secondary | ICD-10-CM | POA: Diagnosis not present

## 2020-05-11 DIAGNOSIS — N2889 Other specified disorders of kidney and ureter: Secondary | ICD-10-CM

## 2020-05-11 LAB — CBC WITH DIFFERENTIAL/PLATELET
Abs Immature Granulocytes: 0.02 10*3/uL (ref 0.00–0.07)
Basophils Absolute: 0 10*3/uL (ref 0.0–0.1)
Basophils Relative: 1 %
Eosinophils Absolute: 0.1 10*3/uL (ref 0.0–0.5)
Eosinophils Relative: 1 %
HCT: 35.7 % — ABNORMAL LOW (ref 36.0–46.0)
Hemoglobin: 11 g/dL — ABNORMAL LOW (ref 12.0–15.0)
Immature Granulocytes: 0 %
Lymphocytes Relative: 17 %
Lymphs Abs: 1.5 10*3/uL (ref 0.7–4.0)
MCH: 23.9 pg — ABNORMAL LOW (ref 26.0–34.0)
MCHC: 30.8 g/dL (ref 30.0–36.0)
MCV: 77.6 fL — ABNORMAL LOW (ref 80.0–100.0)
Monocytes Absolute: 0.7 10*3/uL (ref 0.1–1.0)
Monocytes Relative: 8 %
Neutro Abs: 6.3 10*3/uL (ref 1.7–7.7)
Neutrophils Relative %: 73 %
Platelets: 570 10*3/uL — ABNORMAL HIGH (ref 150–400)
RBC: 4.6 MIL/uL (ref 3.87–5.11)
RDW: 14.2 % (ref 11.5–15.5)
WBC: 8.6 10*3/uL (ref 4.0–10.5)
nRBC: 0 % (ref 0.0–0.2)

## 2020-05-11 LAB — URINALYSIS, COMPLETE (UACMP) WITH MICROSCOPIC
Bilirubin Urine: NEGATIVE
Glucose, UA: NEGATIVE mg/dL
Ketones, ur: NEGATIVE mg/dL
Nitrite: NEGATIVE
Protein, ur: 30 mg/dL — AB
Specific Gravity, Urine: 1.015 (ref 1.005–1.030)
WBC, UA: >50 WBC/hpf — ABNORMAL HIGH (ref 0–5)
pH: 5 (ref 5.0–8.0)

## 2020-05-11 LAB — COMPREHENSIVE METABOLIC PANEL
ALT: 11 U/L (ref 0–44)
AST: 13 U/L — ABNORMAL LOW (ref 15–41)
Albumin: 3.4 g/dL — ABNORMAL LOW (ref 3.5–5.0)
Alkaline Phosphatase: 70 U/L (ref 38–126)
Anion gap: 12 (ref 5–15)
BUN: 12 mg/dL (ref 8–23)
CO2: 23 mmol/L (ref 22–32)
Calcium: 9.5 mg/dL (ref 8.9–10.3)
Chloride: 109 mmol/L (ref 98–111)
Creatinine, Ser: 0.89 mg/dL (ref 0.44–1.00)
GFR, Estimated: >60 mL/min (ref >60)
Glucose, Bld: 108 mg/dL — ABNORMAL HIGH (ref 70–99)
Potassium: 3.9 mmol/L (ref 3.5–5.1)
Sodium: 144 mmol/L (ref 135–145)
Total Bilirubin: 0.5 mg/dL (ref 0.3–1.2)
Total Protein: 7.4 g/dL (ref 6.5–8.1)

## 2020-05-11 MED ORDER — OXYCODONE HCL 5 MG PO TABS
5.0000 mg | ORAL_TABLET | ORAL | 0 refills | Status: DC | PRN
Start: 2020-05-11 — End: 2020-05-20

## 2020-05-12 ENCOUNTER — Other Ambulatory Visit: Payer: Self-pay | Admitting: Hematology and Oncology

## 2020-05-12 ENCOUNTER — Telehealth: Payer: Self-pay

## 2020-05-12 ENCOUNTER — Ambulatory Visit: Payer: Self-pay | Admitting: Hematology and Oncology

## 2020-05-12 ENCOUNTER — Encounter: Payer: Self-pay | Admitting: Hematology and Oncology

## 2020-05-12 DIAGNOSIS — B9620 Unspecified Escherichia coli [E. coli] as the cause of diseases classified elsewhere: Secondary | ICD-10-CM | POA: Insufficient documentation

## 2020-05-12 DIAGNOSIS — N390 Urinary tract infection, site not specified: Secondary | ICD-10-CM

## 2020-05-12 DIAGNOSIS — E119 Type 2 diabetes mellitus without complications: Secondary | ICD-10-CM | POA: Insufficient documentation

## 2020-05-12 DIAGNOSIS — G893 Neoplasm related pain (acute) (chronic): Secondary | ICD-10-CM | POA: Insufficient documentation

## 2020-05-12 DIAGNOSIS — D539 Nutritional anemia, unspecified: Secondary | ICD-10-CM | POA: Insufficient documentation

## 2020-05-12 MED ORDER — SULFAMETHOXAZOLE-TRIMETHOPRIM 800-160 MG PO TABS: 1.0000 | ORAL_TABLET | Freq: Two times a day (BID) | ORAL | 0 refills | Status: DC

## 2020-05-12 NOTE — Assessment & Plan Note (Signed)
She will continue her current blood pressure medication Her blood pressure could be elevated due to pain

## 2020-05-12 NOTE — Assessment & Plan Note (Signed)
I recommend trial of narcotic prescription I recommend we avoid NSAID for cancer pain management I warned her about risk of constipation and recommend aggressive laxatives I will assess pain control next week

## 2020-05-12 NOTE — Assessment & Plan Note (Addendum)
I have multiple concerns about this patient She has minimal understanding/medical knowledge about disease process Her last CT imaging study was in November, highlighting likely lymph node involvement and possible second malignancy with renal cell carcinoma That CT imaging also disclose possible lung metastasis She is having pelvic pain as well as signs and symptoms of urinary tract infection I recommend pain management as well as antibiotic therapy I cannot put order for port placement until her urinary tract infection is resolved  She has urology appointment pending this week We will get her case discussed at the GYN oncology tumor board For treatment of uterine cancer, the plan would be for cisplatin given on days 1 and 28 along with radiation followed by carboplatin and Taxol with Herceptin For renal cell carcinoma, she would need nephrectomy With the abnormal lung nodule seen on CT imaging, the concern would be whether this represent metastatic uterine cancer or renal cell carcinoma  She will likely need repeat full body imaging study with CT scan of the chest, abdomen and pelvis before we start treatment  I plan to see her again next week for further follow-up and discussion about plan of care

## 2020-05-12 NOTE — Progress Notes (Signed)
GYN Location of Tumor / Histology: Endometrial  Mickie Bail. Fluke presented with symptoms of: pelvic pain and bleeding  Biopsies revealed: 04/28/2020    Past/Anticipated interventions by Gyn/Onc surgery, if any: Pap/endometrial biopsy  Past/Anticipated interventions by medical oncology, if any: None  Weight changes, if any: no  Bowel/Bladder complaints, if any: Reports urinary frequency. Reports difficulty emptying her bladder completely. Reports dysuria. Denies hematuria. Reports vaginal bleeding has stopped. Reports vaginal discharge and odor continues. Denies vaginal itching. Reports she hasn't had a bowel movement since Friday. Abdomen distended but soft. Provided and reviewed constipation handout. Patient verbalized understanding.   Nausea/Vomiting, if any: Reports nausea. Reports two episodes of vomiting on Friday following antibiotics but none since.   Pain issues, if any:  Reports constant low abdominal pain 1/10. Reports taking oxy IR to manage pain. Reports pain interrupts her sleep.    SAFETY ISSUES:  Prior radiation? no  Pacemaker/ICD? no  Possible current pregnancy? no  Is the patient on methotrexate? no  Current Complaints / other details:  65 year old female. Accompanied by her daughter. BP elevated. Denies chest pain, headache or dizziness. Confirms she took her losartan this morning. Encouraged to follow up with PCP about elevated BP. Patient verbalized understanding.

## 2020-05-12 NOTE — Telephone Encounter (Signed)
Called and given below message. Reviewed upcoming appts. She verbalized understanding. 

## 2020-05-12 NOTE — Assessment & Plan Note (Signed)
She was recently found to have urinary tract infection and was prescribed nitrofurantoin She still have symptoms of dysuria and pain Repeat urinalysis came back abnormal Urine culture is pending I will proceed to treat her with different antibiotic this time and plan to repeat urine culture again next

## 2020-05-12 NOTE — Assessment & Plan Note (Signed)
She is currently not on medication for this We will monitor her blood sugar closely while she is on treatment

## 2020-05-12 NOTE — Assessment & Plan Note (Signed)
She is anemic I will order additional work-up

## 2020-05-12 NOTE — Progress Notes (Signed)
[name] CONSULT NOTE  Patient Care Team: Patient, No Pcp Per as PCP - General (General Practice) [name], [name], RN as Oncology Nurse Navigator (Oncology)  ASSESSMENT & PLAN:  Endometrial carcinoma [facility]) I have multiple concerns about this patient She has minimal understanding/medical knowledge about disease process Her last CT imaging study was in November, highlighting likely lymph node involvement and possible second malignancy with renal cell carcinoma That CT imaging also disclose possible lung metastasis She is having pelvic pain as well as signs and symptoms of urinary tract infection I recommend pain management as well as antibiotic therapy I cannot put order for port placement until her urinary tract infection is resolved  She has urology appointment pending this week We will get her case discussed at the GYN oncology tumor board For treatment of uterine cancer, the plan would be for cisplatin given on days 1 and 28 along with radiation followed by carboplatin and Taxol with Herceptin For renal cell carcinoma, she would need nephrectomy With the abnormal lung nodule seen on CT imaging, the concern would be whether this represent metastatic uterine cancer or renal cell carcinoma  She will likely need repeat full body imaging study with CT scan of the chest, abdomen and pelvis before we start treatment  I plan to see her again next week for further follow-up and discussion about plan of care  E. coli UTI She was recently found to have urinary tract infection and was prescribed nitrofurantoin She still have symptoms of dysuria and pain Repeat urinalysis came back abnormal Urine culture is pending I will proceed to treat her with different antibiotic this time and plan to repeat urine culture again next  Deficiency anemia She is anemic I will order additional work-up  Hypertension She will continue her current blood pressure medication Her blood pressure  could be elevated due to pain  Cancer associated pain I recommend trial of narcotic prescription I recommend we avoid NSAID for cancer pain management I warned her about risk of constipation and recommend aggressive laxatives I will assess pain control next week  Right renal mass This is highly suspicious for malignancy Urology consult is pending  Diabetes mellitus ([place]) She is currently not on medication for this We will monitor her blood sugar closely while she is on treatment   Orders Placed This Encounter  Procedures  . Urine Culture    Standing Status:   Future    Number of Occurrences:   1    Standing Expiration Date:   05/11/2021  . Comprehensive metabolic panel    Standing Status:   Future    Number of Occurrences:   1    Standing Expiration Date:   05/11/2021  . CBC with Differential/Platelet    Standing Status:   Future    Number of Occurrences:   1    Standing Expiration Date:   05/11/2021  . Urinalysis, Complete w Microscopic    Standing Status:   Future    Number of Occurrences:   1    Standing Expiration Date:   05/11/2021    The total time spent in the appointment was 60 minutes encounter with patients including review of chart and various tests results, discussions about plan of care and coordination of care plan   All questions were answered. The patient knows to call the clinic with any problems, questions or concerns. No barriers to learning was detected.  [name], MD 1/6/20228:29 AM  CHIEF COMPLAINTS/PURPOSE OF CONSULTATION:  Newly diagnosed uterine cancer and abnormal renal mass suspicious for renal cell carcinoma  HISTORY OF PRESENTING ILLNESS:  Amanda Lin 65 y.o. female is here because of recent diagnosis of uterine cancer She is here accompanied by her family The patient have strong family history of uterine cancer She has minimal medical care until recently Her symptoms started with postmenopausal bleeding She denies significant vaginal  bleeding but complains of pelvic pain and persistent dysuria despite being placed on antibiotics recently for urinary tract infection She has been taking NSAID  I have reviewed her chart and materials related to her cancer extensively and collaborated history with the patient. 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     02/26/2020 Imaging   1. 3 mm nonobstructing renal stone within the left kidney. 2. Findings suggestive of a small hemorrhagic cyst within the lower pole of the right kidney, with an anterior right lower pole heterogeneous renal soft tissue mass. MRI correlation is recommended, as an underlying neoplastic process cannot be excluded. 3. Colonic diverticulosis. 4. Multiple exophytic uterine fibroids. 5. Evidence of prior cholecystectomy.   03/28/2020 Imaging   2.4 cm enhancing mass in the anterior right lower kidney, suspicious for solid renal neoplasm such as renal cell carcinoma.   Single right renal artery and vein.  No renal vein invasion.   Small retroperitoneal nodes, measuring up to 9 mm short axis, mildly prominent but technically within the upper limits of normal. Attention on follow-up is suggested.   4 x 5 mm nodule in the medial right lower lobe. This is nonspecific and unlikely to reflect a metastasis but warrants attention on follow-up. Consider dedicated CT chest in 3-6 months.   3 mm nonobstructing left lower pole renal calculus. No hydronephrosis.     04/04/2020 Imaging   1. 10 mm endometrial stripe thickness. This is considered abnormal in a postmenopausal female with bleeding. In the setting of post-menopausal bleeding, endometrial sampling is indicated to exclude carcinoma.  2. Multiple uterine fibroids including potential submucosal  fibroid in the lower uterine segment.   04/19/2020 Pathology Results   A. ENDOMETRIUM, BIOPSY:  -  High-grade carcinoma  Immunohistochemical and morphometric analysis performed manually   The tumor cells are POSITIVE for Her2 (3+).    04/19/2020 Procedure   She was seen by Dr. Astrid Drafts on 04/19/2020.  At that visit her cervix was noted to be nodular and firm and abnormal appearing.  Pap taken at that visit was positive for adenocarcinoma, endometrial.  An endometrial Pipelle biopsy was taken at the same visit which revealed high-grade endometrial cancer, suspicious for serous carcinoma.       MEDICAL HISTORY:  Past Medical History:  Diagnosis Date  . Diabetes mellitus without complication ([place])   . Hypertension     SURGICAL HISTORY: Past Surgical History:  Procedure Laterality Date  . TUBAL LIGATION Bilateral     SOCIAL HISTORY: Social History   Socioeconomic History  . Marital status: Married    Spouse name: Richard  . Number of children: 2  . Years of education: Not on file  . Highest education level: Not on file  Occupational History  . Occupation: retired  Tobacco Use  . Smoking status: Never Smoker  . Smokeless tobacco: Never Used  Vaping Use  . Vaping Use: Never  used  Substance and Sexual Activity  . Alcohol use: Yes    Comment: ocassionally  . Drug use: No  . Sexual activity: Yes  Other Topics Concern  . Not on file  Social History Narrative  . Not on file   Social Determinants of Health   Financial Resource Strain: Not on file  Food Insecurity: Food Insecurity Present  . Worried About Charity fundraiser in the Last Year: Often true  . Ran Out of Food in the Last Year: Sometimes true  Transportation Needs: No Transportation Needs  . Lack of Transportation (Medical): No  . Lack of Transportation (Non-Medical): No  Physical Activity: Not on file  Stress: Not on file  Social Connections: Not on file  Intimate Partner Violence: Not on file     FAMILY HISTORY: Family History  Problem Relation Age of Onset  . Cancer Mother        uterine diagnosed around 89  . Prostate cancer Other     ALLERGIES:  has No Known Allergies.  MEDICATIONS:  Current Outpatient Medications  Medication Sig Dispense Refill  . oxyCODONE (OXY IR/ROXICODONE) 5 MG immediate release tablet Take 1 tablet (5 mg total) by mouth every 4 (four) hours as needed for severe pain. 30 tablet 0  . [employer] PAIN RELIEF 4 % CREA Apply 1 application topically daily as needed (pain).     Marland Kitchen diclofenac Sodium (VOLTAREN) 1 % GEL Apply 1 application topically daily as needed (pain).     Marland Kitchen losartan (COZAAR) 50 MG tablet Take 50 mg by mouth daily.    . meloxicam (MOBIC) 7.5 MG tablet Take 7.5 mg by mouth daily as needed for pain.     Marland Kitchen sulfamethoxazole-trimethoprim (BACTRIM DS) 800-160 MG tablet Take 1 tablet by mouth 2 (two) times daily. 6 tablet 0   No current facility-administered medications for this visit.    REVIEW OF SYSTEMS:   Constitutional: Denies fevers, chills or abnormal night sweats Eyes: Denies blurriness of vision, double vision or watery eyes Ears, nose, mouth, throat, and face: Denies mucositis or sore throat Respiratory: Denies cough, dyspnea or wheezes Cardiovascular: Denies palpitation, chest discomfort or lower extremity swelling Gastrointestinal:  Denies nausea, heartburn or change in bowel habits Skin: Denies abnormal skin rashes Lymphatics: Denies new lymphadenopathy or easy bruising Neurological:Denies numbness, tingling or new weaknesses Behavioral/Psych: Mood is stable, no new changes  All other systems were reviewed with the patient and are negative.  PHYSICAL EXAMINATION: ECOG PERFORMANCE STATUS: 1 - Symptomatic but completely ambulatory  Vitals:   05/11/20 1435  BP: (!) 170/74  Pulse: 89  Resp: 18  Temp: 97.8 F (36.6 C)  SpO2: 100%   Filed Weights   05/11/20 1435  Weight: 259 lb 12.8 oz (117.8 kg)    GENERAL:alert, no  distress and comfortable SKIN: skin color, texture, turgor are normal, no rashes or significant lesions EYES: normal, conjunctiva are pink and non-injected, sclera clear OROPHARYNX:no exudate, no erythema and lips, buccal mucosa, and tongue normal  NECK: supple, thyroid normal size, non-tender, without nodularity LYMPH:  no palpable lymphadenopathy in the cervical, axillary or inguinal LUNGS: clear to auscultation and percussion with normal breathing effort HEART: regular rate & rhythm and no murmurs and no lower extremity edema ABDOMEN:abdomen soft, non-tender and normal bowel sounds Musculoskeletal:no cyanosis of digits and no clubbing  PSYCH: alert & oriented x 3 with fluent speech NEURO: no focal motor/sensory deficits  LABORATORY DATA:  I have reviewed the data as listed Lab Results  Component Value Date   WBC 8.6 05/11/2020   HGB 11.0 (L) 05/11/2020   HCT 35.7 (L) 05/11/2020   MCV 77.6 (L) 05/11/2020   PLT 570 (H) 05/11/2020   Recent Labs    02/23/20 1113 02/26/20 1007 04/04/20 1300 05/11/20 1510  NA 138 136 140 144  K 4.2 4.3 3.3* 3.9  CL 102 101 105 109  CO2 19* 25 22 23   GLUCOSE 138* 136* 134* 108*  BUN 15 12 10 12   CREATININE 0.80 0.72 0.77 0.89  CALCIUM 9.6 9.3 9.5 9.5  GFRNONAA 79 >60 >60 >60  GFRAA 91  --   --   --   PROT 7.8 7.9 7.2 7.4  ALBUMIN 4.4 3.7 3.7 3.4*  AST 14 23 13* 13*  ALT 19 15 13 11   ALKPHOS 115 83 70 70  BILITOT 0.6 1.3* 0.7 0.5    RADIOGRAPHIC STUDIES: I have reviewed her latest CT imaging I have personally reviewed the radiological images as listed and agreed with the findings in the report.

## 2020-05-12 NOTE — Assessment & Plan Note (Signed)
This is highly suspicious for malignancy Urology consult is pending

## 2020-05-12 NOTE — Telephone Encounter (Signed)
-----   Message from Artis Delay, MD sent at 05/12/2020  8:02 AM EST ----- Regarding: UTI and appt 1) please call her: urinalysis looks like persistent UTI. I sent antibiotics to her pharmacy. Please tell her to start this morning 2) add lab after her radiation appt to repeat urine test and a few more labs 3) add appt on Friday 1/14 at 10 am, 20 mins for me to see her and review pain managament

## 2020-05-13 LAB — URINE CULTURE

## 2020-05-16 ENCOUNTER — Ambulatory Visit
Admission: RE | Admit: 2020-05-16 | Discharge: 2020-05-16 | Disposition: A | Discharge status: Home / Self Care | Payer: 59 | Source: Ambulatory Visit | Attending: Radiation Oncology | Admitting: Radiation Oncology

## 2020-05-16 ENCOUNTER — Other Ambulatory Visit: Payer: Self-pay | Admitting: Oncology

## 2020-05-16 ENCOUNTER — Telehealth: Payer: Self-pay | Admitting: Oncology

## 2020-05-16 ENCOUNTER — Inpatient Hospital Stay: Payer: 59

## 2020-05-16 ENCOUNTER — Institutional Professional Consult (permissible substitution): Payer: Self-pay | Admitting: Radiation Oncology

## 2020-05-16 ENCOUNTER — Other Ambulatory Visit: Payer: Self-pay

## 2020-05-16 ENCOUNTER — Encounter: Payer: Self-pay | Admitting: Radiation Oncology

## 2020-05-16 ENCOUNTER — Encounter: Payer: Self-pay | Admitting: Internal Medicine

## 2020-05-16 VITALS — BP 187/85 | HR 117 | Temp 97.8°F | Resp 18 | Ht 61.0 in | Wt 263.6 lb

## 2020-05-16 DIAGNOSIS — N390 Urinary tract infection, site not specified: Secondary | ICD-10-CM

## 2020-05-16 DIAGNOSIS — C541 Malignant neoplasm of endometrium: Secondary | ICD-10-CM

## 2020-05-16 DIAGNOSIS — B9620 Unspecified Escherichia coli [E. coli] as the cause of diseases classified elsewhere: Secondary | ICD-10-CM

## 2020-05-16 DIAGNOSIS — D539 Nutritional anemia, unspecified: Secondary | ICD-10-CM

## 2020-05-16 HISTORY — DX: Malignant neoplasm of endometrium: C54.1

## 2020-05-16 LAB — URINALYSIS, COMPLETE (UACMP) WITH MICROSCOPIC
Bilirubin Urine: NEGATIVE
Glucose, UA: NEGATIVE mg/dL
Ketones, ur: NEGATIVE mg/dL
Nitrite: NEGATIVE
Protein, ur: 100 mg/dL — AB
RBC / HPF: >50 RBC/hpf — ABNORMAL HIGH (ref 0–5)
Specific Gravity, Urine: 1.015 (ref 1.005–1.030)
pH: 5 (ref 5.0–8.0)

## 2020-05-16 LAB — CBC WITH DIFFERENTIAL/PLATELET
Abs Immature Granulocytes: 0.04 10*3/uL (ref 0.00–0.07)
Basophils Absolute: 0 10*3/uL (ref 0.0–0.1)
Basophils Relative: 0 %
Eosinophils Absolute: 0 10*3/uL (ref 0.0–0.5)
Eosinophils Relative: 0 %
HCT: 34.2 % — ABNORMAL LOW (ref 36.0–46.0)
Hemoglobin: 10.7 g/dL — ABNORMAL LOW (ref 12.0–15.0)
Immature Granulocytes: 0 %
Lymphocytes Relative: 11 %
Lymphs Abs: 1.3 10*3/uL (ref 0.7–4.0)
MCH: 23.6 pg — ABNORMAL LOW (ref 26.0–34.0)
MCHC: 31.3 g/dL (ref 30.0–36.0)
MCV: 75.5 fL — ABNORMAL LOW (ref 80.0–100.0)
Monocytes Absolute: 1.2 10*3/uL — ABNORMAL HIGH (ref 0.1–1.0)
Monocytes Relative: 10 %
Neutro Abs: 8.9 10*3/uL — ABNORMAL HIGH (ref 1.7–7.7)
Neutrophils Relative %: 79 %
Platelets: 546 10*3/uL — ABNORMAL HIGH (ref 150–400)
RBC: 4.53 MIL/uL (ref 3.87–5.11)
RDW: 14.6 % (ref 11.5–15.5)
WBC: 11.5 10*3/uL — ABNORMAL HIGH (ref 4.0–10.5)
nRBC: 0 % (ref 0.0–0.2)

## 2020-05-16 LAB — IRON AND TIBC
Iron: 17 ug/dL — ABNORMAL LOW (ref 41–142)
Saturation Ratios: 5 % — ABNORMAL LOW (ref 21–57)
TIBC: 343 ug/dL (ref 236–444)
UIBC: 325 ug/dL (ref 120–384)

## 2020-05-16 LAB — VITAMIN B12: Vitamin B-12: 459 pg/mL (ref 180–914)

## 2020-05-16 LAB — FERRITIN: Ferritin: 45 ng/mL (ref 11–307)

## 2020-05-16 NOTE — Addendum Note (Signed)
Addended by: Elmo Putt R on: 05/16/2020 10:28 AM   Modules accepted: Orders

## 2020-05-16 NOTE — Telephone Encounter (Signed)
Left a message for [facility] regarding GYN Cancer Conference recommendations and PET scan appointment.  Requested a return call.

## 2020-05-16 NOTE — Progress Notes (Addendum)
Radiation Oncology         [phone] ________________________________  Initial Outpatient Consultation  Name: Amanda Lin. Brea MRN: [id number]  Date: 05/16/2020  DOB: 1956/04/02  [id number], No Pcp Per  Everitt Amber, MD   REFERRING PHYSICIAN: Everitt Amber, MD  DIAGNOSIS: The encounter diagnosis was Endometrial carcinoma [facility] [place]).  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 Lin is a 65 y.o. female who is seen as a courtesy of Dr. Denman George for an opinion concerning radiation therapy as part of management for her recently diagnosed endometrial cancer. Today, she is accompanied by her daughter. The patient presented to the ED on 04/04/2020 for evaluation of one-month history of postmenopausal vaginal bleeding. On examination, the patient was noted to have an abnormal cervix with cervical friability with oozing hemorrhage. Transvaginal ultrasound performed at that time showed 10 mm endometrial stripe thickness that was considered abnormal in a postmenopausal female with bleeding. Endometrial sampling was recommended in order to exclude carcinoma. There were also noted to be multiple uterine fibroids including a potential submucosal fibroid in the lower uterine segment.  Given the above findings, the patient was referred to Dr. Astrid Drafts, [place], and seen on 04/19/2020. Endometrial biopsy performed at that time revealed high-grade carcinoma. PAP smear was negative for high-risk HPV but did reveal endometrial adenocarcinoma.  Subsequently, the patient was seen by Dr. Denman George on 04/28/2020, who was concerned that she was a poor candidate for extrafascial hysterectomy after complete response given her morbid obesity, short stature, and poorly controlled diabetes. Instead, it was favored that she proceed with definitive radiation with both intracavitary and external beam radiation.  The patient was seen in consultation with Dr. Alvy Bimler on  05/12/2019, who recommended Cisplatin with radiation followed by Carboplatin and Taxol with Herceptin.  Of note, the patient did undergo a CT scan of abdomen and pelvis on 03/28/2020 that showed a 2.4 cm enhancing mass in the anterior right lower kidney that was suspicious for solid renal neoplasm such as renal cell carcinoma. There was also noted to be a single right renal artery and vein without renal vein invasion. Additionally, there were small retroperitoneal nodes that measured up to 9 mm in short axis, mildly prominent but technically within the upper limits of normal. Finally, there was a 4 x 5 mm nodule in the medial right lower lobe (non-specific and unlikely to reflect a metastasis but warranted attention on follow-up) as well as a 3 mm non-obstructing left lower pole renal calculus without hydronephrosis.  PREVIOUS RADIATION THERAPY: No  PAST MEDICAL HISTORY:  Past Medical History:  Diagnosis Date  . Diabetes mellitus without complication ([place])   . Endometrial cancer ([place])   . Hypertension     PAST SURGICAL HISTORY: Past Surgical History:  Procedure Laterality Date  . TUBAL LIGATION Bilateral     FAMILY HISTORY:  Family History  Problem Relation Age of Onset  . Cancer Mother        uterine diagnosed around 51  . Prostate cancer Other     SOCIAL HISTORY:  Social History   Tobacco Use  . Smoking status: Never Smoker  . Smokeless tobacco: Never Used  Vaping Use  . Vaping Use: Never used  Substance Use Topics  . Alcohol use: Yes    Comment: ocassionally  . Drug use: No    ALLERGIES: No Known Allergies  MEDICATIONS:  Current Outpatient Medications  Medication Sig Dispense Refill  . losartan (COZAAR) 50 MG tablet Take 50  mg by mouth daily.    Marland Kitchen oxyCODONE (OXY IR/ROXICODONE) 5 MG immediate release tablet Take 1 tablet (5 mg total) by mouth every 4 (four) hours as needed for severe pain. 30 tablet 0  . sulfamethoxazole-trimethoprim (BACTRIM DS) 800-160 MG tablet  Take 1 tablet by mouth 2 (two) times daily. 6 tablet 0  . [employer] PAIN RELIEF 4 % CREA Apply 1 application topically daily as needed (pain).  (Patient not taking: Reported on 05/16/2020)    . diclofenac Sodium (VOLTAREN) 1 % GEL Apply 1 application topically daily as needed (pain).  (Patient not taking: Reported on 05/16/2020)     No current facility-administered medications for this encounter.    REVIEW OF SYSTEMS:  A 10+ POINT REVIEW OF SYSTEMS WAS OBTAINED including neurology, dermatology, psychiatry, cardiac, respiratory, lymph, extremities, GI, GU, musculoskeletal, constitutional, reproductive, HEENT.  The patient denies any vaginal bleeding at this time.  She does complain of pain in the anterior pelvis region worsened with recent development of constipation.  She denies any hematuria or rectal bleeding.   PHYSICAL EXAM:  height is 5\' 1"  (1.549 m) and weight is 263 lb 9.6 oz (119.6 kg). Her oral temperature is 97.8 F (36.6 C). Her blood pressure is 187/85 (abnormal) and her pulse is 117 (abnormal). Her respiration is 18.   Body mass index is 49.81 kg/m. General: Alert and oriented, in no acute distress HEENT: Head is normocephalic. Extraocular movements are intact. Oropharynx is clear. Neck: Neck is supple, no palpable cervical or supraclavicular lymphadenopathy. Heart: Regular in rate and rhythm with no murmurs, rubs, or gallops. Chest: Clear to auscultation bilaterally, with no rhonchi, wheezes, or rales. Abdomen: Soft, nontender, nondistended, with no rigidity or guarding. Extremities: No cyanosis or edema. Lymphatics: see Neck Exam Skin: No concerning lesions. Musculoskeletal: symmetric strength and muscle tone throughout. Neurologic: Cranial nerves II through XII are grossly intact. No obvious focalities. Speech is fluent. Coordination is intact. Psychiatric: Judgment and insight are intact. Affect is appropriate. On pelvic examination the external genitalia are unremarkable.  A  speculum exam was performed.  There is tumor noted along the cervical region.  On bimanual examination the cervix is enlarged and quite firm consistent with tumor infiltration.  This area bleeds easily with examination.  Due to the patient's body habitus unable to get accurate assessment of parametrial involvement.  On rectal examination sphincter tone is noted to be normal.  No obvious rectal mucosal involvement by tumor.  Significant stool in the rectal vault.  ECOG = 1  0 - Asymptomatic (Fully active, able to carry on all predisease activities without restriction)  1 - Symptomatic but completely ambulatory (Restricted in physically strenuous activity but ambulatory and able to carry out work of a light or sedentary nature. For example, light housework, office work)  2 - Symptomatic, <50% in bed during the day (Ambulatory and capable of all self care but unable to carry out any work activities. Up and about more than 50% of waking hours)  3 - Symptomatic, >50% in bed, but not bedbound (Capable of only limited self-care, confined to bed or chair 50% or more of waking hours)  4 - Bedbound (Completely disabled. Cannot carry on any self-care. Totally confined to bed or chair)  5 - Death   Eustace Pen MM, Creech RH, Tormey DC, et al. [phone]). "Toxicity and response criteria of the [facility]". [place] Oncol. 5 (6): 649-55  LABORATORY DATA:  Lab Results  Component Value Date   WBC  11.5 (H) 05/16/2020   HGB 10.7 (L) 05/16/2020   HCT 34.2 (L) 05/16/2020   MCV 75.5 (L) 05/16/2020   PLT 546 (H) 05/16/2020   NEUTROABS 8.9 (H) 05/16/2020   Lab Results  Component Value Date   NA 144 05/11/2020   K 3.9 05/11/2020   CL 109 05/11/2020   CO2 23 05/11/2020   GLUCOSE 108 (H) 05/11/2020   CREATININE 0.89 05/11/2020   CALCIUM 9.5 05/11/2020      RADIOGRAPHY: No results found.    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  The patient's imaging was reviewed and although not pathologically enlarged lymph nodes are concerning for possible metastasis.  In light of this it was recommended the patient proceed with a PET scan prior to initiation of therapy.  If the patient is found to have distant metastasis,  this would change the overall treatment plan.  I discussed these recommendations with the patient and her daughter today.  In addition we discussed course of radiation therapy along with radiosensitizing chemotherapy and intracavitary brachytherapy treatments in detail assuming that her PET scan does not show metastatic spread.   We discussed the natural history of endometrial cancer and general treatment, highlighting the role of radiotherapy in the management.  We discussed the available radiation techniques, and focused on the details of logistics and delivery.  We reviewed the anticipated acute and late sequelae associated with radiation in this setting.  The patient was encouraged to ask questions that I answered to the best of my ability.   PLAN: Treatment plan pending results of PET CT scan.  Total time spent in this encounter was 60 minutes which included reviewing the patient's most recent ED visit, transvaginal US, CT scan of abdomen and pelvis, consultations, endometrial biopsy, PAP smear, pathology reports, physical examination, and documentation.   ------------------------------------------------  Blair Promise, PhD, MD  This document serves as a record of services personally performed by Gery Pray, MD. It was created on his behalf by Clerance Lav, a trained medical scribe. The creation of this record is based on the scribe's personal observations and the provider's statements to them. This document has been checked and approved by the attending provider.

## 2020-05-16 NOTE — Addendum Note (Signed)
Addended by: Elmo Putt R on: 05/16/2020 12:33 PM   Modules accepted: Orders

## 2020-05-16 NOTE — Addendum Note (Signed)
Encounter addended by: Wilmon Arms, RN on: 05/16/2020 11:18 AM  Actions taken: Charge Capture section accepted

## 2020-05-16 NOTE — Telephone Encounter (Signed)
Amanda Lin called back and was advised of the PET scan appointment on 05/26/20 (arrival at 8:30, NPO 6 hours before except water).  She verbalized understanding and agreement.  Also called [facility] for office notes from last week.  They said the note wasn't signed yet.

## 2020-05-16 NOTE — Progress Notes (Addendum)
Gynecologic Oncology Multi-Disciplinary Disposition Conference Note  Date of the Conference: 05/16/2020  Patient Name: Amanda Lin  Referring Provider: Dr. Astrid Drafts Primary GYN Oncologist: Dr. Denman George  Stage/Disposition:  Stage II high grade serous carcinoma of the endometrium, Her2 positive. Disposition is to concurrent chemoradiation with chemotherapy on week 1 and 5 with external beam radiation followed by chemotherapy with carboplatin/Taxol/trastuzumab. Also a PET scan will be ordered to evaluate retroperitoneal nodes.  This Multidisciplinary conference took place involving physicians from [place], [place], Radiation Oncology, Pathology, Radiology along with the Gynecologic Oncology Nurse Practitioner and RN.  Comprehensive assessment of the patient's malignancy, staging, need for surgery, chemotherapy, radiation therapy, and need for further testing were reviewed. Supportive measures, both inpatient and following discharge were also discussed. The recommended plan of care is documented. Greater than 35 minutes were spent correlating and coordinating this patient's care.

## 2020-05-17 ENCOUNTER — Ambulatory Visit: Payer: Self-pay | Admitting: Hematology and Oncology

## 2020-05-17 LAB — URINE CULTURE: Culture: NO GROWTH

## 2020-05-17 NOTE — Telephone Encounter (Signed)
Tabby called and said her daughter is working on 1/20 and wants to reschedule the appointment to 1/21 or 1/24.  Called cenrtral scheduling and there are no open apts for 1/21. PET has been rescheduled for 1/24 at 11:00.  Called Glori and advised her of new appointment.  Also discussed the [facility] program in case her daughter is unable to bring her to appointments and Miaa would like to enroll.  Referral email with intake form sent to the transportation program.

## 2020-05-18 ENCOUNTER — Telehealth: Payer: Self-pay | Admitting: Hematology and Oncology

## 2020-05-18 NOTE — Telephone Encounter (Signed)
Scheduled COVID vaccine appointment per 1/12 schedule message. Patient is aware.

## 2020-05-19 ENCOUNTER — Ambulatory Visit: Payer: Self-pay | Admitting: Obstetrics and Gynecology

## 2020-05-20 ENCOUNTER — Telehealth: Payer: Self-pay | Admitting: Oncology

## 2020-05-20 ENCOUNTER — Encounter: Payer: Self-pay | Admitting: Hematology and Oncology

## 2020-05-20 ENCOUNTER — Telehealth: Payer: Self-pay | Admitting: General Practice

## 2020-05-20 ENCOUNTER — Other Ambulatory Visit: Payer: Self-pay

## 2020-05-20 ENCOUNTER — Inpatient Hospital Stay (HOSPITAL_BASED_OUTPATIENT_CLINIC_OR_DEPARTMENT_OTHER): Payer: 59 | Admitting: Hematology and Oncology

## 2020-05-20 ENCOUNTER — Inpatient Hospital Stay: Payer: 59

## 2020-05-20 VITALS — BP 144/91 | HR 118 | Temp 97.0°F | Resp 18 | Ht 61.0 in | Wt 260.0 lb

## 2020-05-20 DIAGNOSIS — C541 Malignant neoplasm of endometrium: Secondary | ICD-10-CM | POA: Diagnosis not present

## 2020-05-20 DIAGNOSIS — D539 Nutritional anemia, unspecified: Secondary | ICD-10-CM

## 2020-05-20 DIAGNOSIS — G893 Neoplasm related pain (acute) (chronic): Secondary | ICD-10-CM

## 2020-05-20 DIAGNOSIS — N2889 Other specified disorders of kidney and ureter: Secondary | ICD-10-CM | POA: Diagnosis not present

## 2020-05-20 DIAGNOSIS — K5909 Other constipation: Secondary | ICD-10-CM | POA: Insufficient documentation

## 2020-05-20 DIAGNOSIS — N390 Urinary tract infection, site not specified: Secondary | ICD-10-CM

## 2020-05-20 DIAGNOSIS — B9620 Unspecified Escherichia coli [E. coli] as the cause of diseases classified elsewhere: Secondary | ICD-10-CM

## 2020-05-20 DIAGNOSIS — Z23 Encounter for immunization: Secondary | ICD-10-CM

## 2020-05-20 MED ORDER — OXYCODONE HCL 5 MG PO TABS: 5.0000 mg | ORAL_TABLET | ORAL | 0 refills | Status: DC | PRN

## 2020-05-20 NOTE — Assessment & Plan Note (Signed)
We will try to reschedule her urology consult

## 2020-05-20 NOTE — Telephone Encounter (Signed)
°   Tia Masker. Scoles DOB: [birth year] MRN: [id number]   RIDER WAIVER AND RELEASE OF LIABILITY  For purposes of improving physical access to our facilities, [facility] is pleased to partner with third parties to provide [facility] patients or other authorized individuals the option of convenient, on-demand ground transportation services (the [employer]) through use of the technology service that enables users to request on-demand ground transportation from independent third-party providers.  By opting to use and accept these [employer], I, the undersigned, hereby agree on behalf of myself, and on behalf of any minor child using the [employer] for whom I am the parent or legal guardian, as follows:  1. Government social research officer provided to me are provided by independent third-party transportation providers who are not [employer] or employees and who are unaffiliated with [employer]. 2. [facility] is neither a transportation carrier nor a common or public carrier. 3. [facility] has no control over the quality or safety of the transportation that occurs as a result of the [employer]. 4. [facility] cannot guarantee that any third-party transportation provider will complete any arranged transportation service. 5. [facility] makes no representation, warranty, or guarantee regarding the reliability, timeliness, quality, safety, suitability, or availability of any of the Transport Services or that they will be error free. 6. I fully understand that traveling by vehicle involves risks and dangers of serious bodily injury, including permanent disability, paralysis, and death. I agree, on behalf of myself and on behalf of any minor child using the Transport Services for whom I am the parent or legal guardian, that the entire risk arising out of my use of the [employer] remains solely with me, to the maximum extent permitted under applicable law. 7. The [employer] are provided as is and as available. [facility] disclaims all representations and warranties, express, implied or statutory, not expressly set out in these terms, including the implied warranties of merchantability and fitness for a particular purpose. 8. I hereby waive and release [facility], its agents, employees, officers, directors, representatives, insurers, attorneys, assigns, successors, subsidiaries, and affiliates from any and all past, present, or future claims, demands, liabilities, actions, causes of action, or suits of any kind directly or indirectly arising from acceptance and use of the [employer]. 9. I further waive and release [facility] and its affiliates from all present and future liability and responsibility for any injury or death to persons or damages to property caused by or related to the use of the [employer]. 10. I have read this Waiver and Release of Liability, and I understand the terms used in it and their legal significance. This Waiver is freely and voluntarily given with the understanding that my right (as well as the right of any minor child for whom I am the parent or legal guardian using the [employer]) to legal recourse against [facility] in connection with the [employer] is knowingly surrendered in return for use of these services.   I attest that I read the consent document to Tia Masker. Sheppard Coil, gave Ms. Kubitz the opportunity to ask questions and answered the questions asked (if any). I affirm that Tia Masker. Diltz then provided consent for she's participation in this program.     Darrick Meigs V[place]aint

## 2020-05-20 NOTE — Assessment & Plan Note (Signed)
Recent work-up suggests iron deficiency I do not believe she can tolerate oral iron supplement I recommend iron rich diet for the

## 2020-05-20 NOTE — Assessment & Plan Note (Signed)
Her UTI has resolved I recommend port placement next week

## 2020-05-20 NOTE — Telephone Encounter (Signed)
[facility] regarding canceled appointment on 05/12/20 and to reschedule.  They said it is due to her needing a referral from her PCP so that her insurance will cover her visits.  They said she can pay $250.00 out of pocket if she doesn't have the referral.  Asked if Dr. Alvy Bimler could send in a referral and they said no it has to be from her PCP.  Talked to [facility] and she does have a primary care doctor but can't remember her name.  She is going to either call her or call me with her name when she gets home today.

## 2020-05-20 NOTE — Progress Notes (Signed)
START OFF PATHWAY REGIMEN - Uterine   [id number]:Cisplatin 40 mg/m2 IV D1 q7 Days + RT:   A cycle is every 7 days:     Cisplatin   **Always confirm dose/schedule in your pharmacy ordering system**  **Administration Notes: cisplatin Days 1 and 28  Patient Characteristics: Serous Carcinoma, Newly Diagnosed (Clinical Staging), Nonsurgical Candidate, Stage III or IV, HER2 Positive Histology: Serous Carcinoma Therapeutic Status: Newly Diagnosed (Clinical Staging) AJCC M Category: cM0 Surgical Candidacy: Nonsurgical Candidate AJCC 8 Stage Grouping: IIIC2 AJCC T Category: cT3 AJCC N Category: cN2 HER2 Status: Positive  Intent of Therapy: Curative Intent, Discussed with Patient

## 2020-05-20 NOTE — Assessment & Plan Note (Signed)
Her pain is well controlled I refill her prescription today I reinforced the importance of aggressive laxative therapy while on pain medicine

## 2020-05-20 NOTE — Progress Notes (Signed)
[place] OFFICE PROGRESS NOTE  Patient Care Team: Patient, No Pcp Per as PCP - General (General Practice) [name], [name], RN as Oncology Nurse Navigator (Oncology)  ASSESSMENT & PLAN:  Endometrial carcinoma [facility]) Her imaging studies and case was recently discussed at the GYN oncology tumor board Unfortunately, her urology consult appointment was canceled due to lack of referral from primary care doctor She is currently scheduled for PET CT scan on January 24 I plan to see her again on the 25th for review of test results If her kidney cancer has not changed, we will focus treatment on metastatic uterine cancer However, if there are signs to suggest diffuse metastatic disease, I might change her treatment and focus on palliative chemotherapy only Per previous discussion, we will prescribe concurrent chemoradiation therapy with cisplatin on day 1 and 28 and then carboplatin, paclitaxel and Herceptin afterwards  Cancer associated pain Her pain is well controlled I refill her prescription today I reinforced the importance of aggressive laxative therapy while on pain medicine  Deficiency anemia Recent work-up suggests iron deficiency I do not believe she can tolerate oral iron supplement I recommend iron rich diet for the  Right renal mass We will try to reschedule her urology consult  E. coli UTI Her UTI has resolved I recommend port placement next week  Other constipation We discussed the importance of aggressive laxative therapy   Orders Placed This Encounter  Procedures  . IR IMAGING GUIDED PORT INSERTION    Standing Status:   Future    Standing Expiration Date:   05/20/2021    Order Specific Question:   Reason for Exam (SYMPTOM  OR DIAGNOSIS REQUIRED)    Answer:   need port for chemo to start 1/28    Order Specific Question:   Preferred Imaging Location?    Answer:   [facility]    All questions were answered. The patient knows to call the  clinic with any problems, questions or concerns. The total time spent in the appointment was 30 minutes encounter with patients including review of chart and various tests results, discussions about plan of care and coordination of care plan   [name], MD 05/20/2020 10:47 AM  INTERVAL HISTORY: Please see below for problem oriented charting. 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     02/26/2020 Imaging   1. 3 mm nonobstructing renal stone within the left kidney. 2. Findings suggestive of a small hemorrhagic cyst within the lower pole of the right kidney, with an anterior right lower pole heterogeneous renal soft tissue mass. MRI correlation is recommended, as an underlying neoplastic process cannot be excluded. 3. Colonic diverticulosis. 4. Multiple exophytic uterine fibroids. 5. Evidence of prior cholecystectomy.   03/28/2020 Imaging   2.4 cm enhancing mass in the anterior right lower kidney, suspicious for solid renal neoplasm such as renal cell carcinoma.   Single right renal artery and vein.  No renal vein invasion.   Small  retroperitoneal nodes, measuring up to 9 mm short axis, mildly prominent but technically within the upper limits of normal. Attention on follow-up is suggested.   4 x 5 mm nodule in the medial right lower lobe. This is nonspecific and unlikely to reflect a metastasis but warrants attention on follow-up.  Consider dedicated CT chest in 3-6 months.   3 mm nonobstructing left lower pole renal calculus. No hydronephrosis.     04/04/2020 Imaging   1. 10 mm endometrial stripe thickness. This is considered abnormal in a postmenopausal female with bleeding. In the setting of post-menopausal bleeding, endometrial sampling is indicated to exclude carcinoma.  2. Multiple uterine fibroids including potential submucosal fibroid in the lower uterine segment.   04/19/2020 Pathology Results   A. ENDOMETRIUM, BIOPSY:  -  High-grade carcinoma  Immunohistochemical and morphometric analysis performed manually   The tumor cells are POSITIVE for Her2 (3+).    04/19/2020 Procedure   She was seen by Dr. Astrid Drafts on 04/19/2020.  At that visit her cervix was noted to be nodular and firm and abnormal appearing.  Pap taken at that visit was positive for adenocarcinoma, endometrial.  An endometrial Pipelle biopsy was taken at the same visit which revealed high-grade endometrial cancer, suspicious for serous carcinoma.     05/12/2020 Cancer Staging   Staging form: Corpus Uteri - Carcinoma and Carcinosarcoma, AJCC 8th Edition - Clinical stage from 05/12/2020: Stage IIIC2 (cT3, cN2, cM0) - Signed by [name], MD on 05/12/2020   06/03/2020 -  Chemotherapy    Patient is on Treatment Plan: CERVICAL CISPLATIN DAYS 1 AND 28        REVIEW OF SYSTEMS:   Constitutional: Denies fevers, chills or abnormal weight loss Eyes: Denies blurriness of vision Ears, nose, mouth, throat, and face: Denies mucositis or sore throat Respiratory: Denies cough, dyspnea or wheezes Cardiovascular: Denies palpitation, chest discomfort or lower extremity swelling Skin: Denies abnormal skin rashes Lymphatics: Denies new lymphadenopathy or easy bruising Neurological:Denies numbness, tingling or new weaknesses Behavioral/Psych: Mood is stable, no new changes  All other systems were reviewed with the patient and are negative.  I have reviewed  the past medical history, past surgical history, social history and family history with the patient and they are unchanged from previous note.  ALLERGIES:  has No Known Allergies.  MEDICATIONS:  Current Outpatient Medications  Medication Sig Dispense Refill  . polyethylene glycol (MIRALAX / GLYCOLAX) 17 g packet Take 17 g by mouth daily.    Marland Kitchen losartan (COZAAR) 50 MG tablet Take 50 mg by mouth daily.    Marland Kitchen oxyCODONE (OXY IR/ROXICODONE) 5 MG immediate release tablet Take 1 tablet (5 mg total) by mouth every 4 (four) hours as needed for severe pain. 60 tablet 0   No current facility-administered medications for this visit.    PHYSICAL EXAMINATION: ECOG PERFORMANCE STATUS: 1 - Symptomatic but completely ambulatory  Vitals:   05/20/20 0934  BP: (!) 144/91  Pulse: (!) 118  Resp: 18  Temp: (!) 97 F (36.1 C)  SpO2: 100%   Filed Weights   05/20/20 0934  Weight: 260 lb (117.9 kg)    GENERAL:alert, no distress and comfortable Musculoskeletal:no cyanosis of digits and no clubbing  NEURO: alert & oriented x 3 with fluent speech, no focal motor/sensory deficits  LABORATORY DATA:  I have reviewed the data as listed    Component Value Date/Time   NA 144 05/11/2020 1510   NA 138 02/23/2020 1113   K 3.9 05/11/2020 1510  CL 109 05/11/2020 1510   CO2 23 05/11/2020 1510   GLUCOSE 108 (H) 05/11/2020 1510   BUN 12 05/11/2020 1510   BUN 15 02/23/2020 1113   CREATININE 0.89 05/11/2020 1510   CALCIUM 9.5 05/11/2020 1510   PROT 7.4 05/11/2020 1510   PROT 7.8 02/23/2020 1113   ALBUMIN 3.4 (L) 05/11/2020 1510   ALBUMIN 4.4 02/23/2020 1113   AST 13 (L) 05/11/2020 1510   ALT 11 05/11/2020 1510   ALKPHOS 70 05/11/2020 1510   BILITOT 0.5 05/11/2020 1510   BILITOT 0.6 02/23/2020 1113   GFRNONAA >60 05/11/2020 1510   GFRAA 91 02/23/2020 1113    No results found for: SPEP, UPEP  Lab Results  Component Value Date   WBC 11.5 (H) 05/16/2020   NEUTROABS 8.9 (H) 05/16/2020   HGB 10.7 (L)  05/16/2020   HCT 34.2 (L) 05/16/2020   MCV 75.5 (L) 05/16/2020   PLT 546 (H) 05/16/2020      Chemistry      Component Value Date/Time   NA 144 05/11/2020 1510   NA 138 02/23/2020 1113   K 3.9 05/11/2020 1510   CL 109 05/11/2020 1510   CO2 23 05/11/2020 1510   BUN 12 05/11/2020 1510   BUN 15 02/23/2020 1113   CREATININE 0.89 05/11/2020 1510      Component Value Date/Time   CALCIUM 9.5 05/11/2020 1510   ALKPHOS 70 05/11/2020 1510   AST 13 (L) 05/11/2020 1510   ALT 11 05/11/2020 1510   BILITOT 0.5 05/11/2020 1510   BILITOT 0.6 02/23/2020 1113

## 2020-05-20 NOTE — Assessment & Plan Note (Signed)
Her imaging studies and case was recently discussed at the GYN oncology tumor board Unfortunately, her urology consult appointment was canceled due to lack of referral from primary care doctor She is currently scheduled for PET CT scan on January 24 I plan to see her again on the 25th for review of test results If her kidney cancer has not changed, we will focus treatment on metastatic uterine cancer However, if there are signs to suggest diffuse metastatic disease, I might change her treatment and focus on palliative chemotherapy only Per previous discussion, we will prescribe concurrent chemoradiation therapy with cisplatin on day 1 and 28 and then carboplatin, paclitaxel and Herceptin afterwards

## 2020-05-20 NOTE — Progress Notes (Signed)
   [id number]  Name:  Amanda Lin    MRN: [id number] DOB: Jul 01, 1955  05/20/2020  Amanda Lin was observed post Covid-19 immunization for 30 minutes without incident. She was provided with Vaccine Information Sheet and instruction to access the V-Safe system.   Amanda Lin was instructed to call 911 with any severe reactions post vaccine: Marland Kitchen Difficulty breathing  . Swelling of face and throat  . A fast heartbeat  . A bad rash all over body  . Dizziness and weakness   Immunizations Administered    Name Date Dose VIS Date Route   Pfizer COVID-19 Vaccine 05/20/2020 10:21 AM 0.3 mL 02/24/2020 Intramuscular   Manufacturer: [place]   Lot: [id number]   NDC: [address]-6

## 2020-05-20 NOTE — Assessment & Plan Note (Signed)
We discussed the importance of aggressive laxative therapy 

## 2020-05-23 ENCOUNTER — Telehealth: Payer: Self-pay | Admitting: Oncology

## 2020-05-23 ENCOUNTER — Encounter: Payer: Self-pay | Admitting: *Deleted

## 2020-05-23 ENCOUNTER — Telehealth: Payer: Self-pay

## 2020-05-23 NOTE — Telephone Encounter (Signed)
Amanda Lin and asked who her PCP is.  She is going to find the name and call back this afternoon.

## 2020-05-23 NOTE — Telephone Encounter (Signed)
Called and left a message asking her to call the office back. 

## 2020-05-23 NOTE — Telephone Encounter (Signed)
-----   Message from [name], MD sent at 05/23/2020  2:18 PM EST ----- Regarding: can you call and make sure she is not constipated?

## 2020-05-23 NOTE — Progress Notes (Signed)
[place] Work  Initial Assessment   Amanda Lin is a 65 y.o. year old female. Clinical Social Work was referred by distress screening protocol for assessment of psychosocial needs.   SDOH (Social Determinants of Health) assessments performed: Yes   Distress Screen completed: Yes ONCBCN DISTRESS SCREENING 05/20/2020  Screening Type Initial Screening  Distress experienced in past week (1-10) 8  Emotional problem type -  Physical Problem type Pain;Nausea/vomiting;Sleep/insomnia;Getting around;Bathing/dressing;Loss of appetitie  Physician notified of physical symptoms Yes  Referral to clinical psychology -  Referral to clinical social work Yes  Referral to dietition -  Referral to financial advocate -  Referral to support programs -  Referral to palliative care -      Family/Social Information:  . Housing Arrangement: patient lives with husband son . Family members/support persons in your life? Family - husband, daughter, son in law, aunt . Transportation concerns: yes, signed up for transportation program, daughter and aunt can also bring when they are not working  . Employment: Unemployed. Income source: No income . Financial concerns: No o Type of concern: None . Food access concerns: no . Religious or spiritual practice: yes . Medication Concerns: no  . Services Currently in place: no  Coping/ Adjustment to diagnosis: . Patient understands treatment plan and what happens next? yes . Concerns about diagnosis and/or treatment: somewhat anxious about beginning treatment and "what to expect" . Patient reported stressors: Physical issues . Hopes and priorities: "getting rid of this cancer" . Patient enjoys time with family/ friends . Current coping skills/ strengths: Communication skills and Supportive family/friends    SUMMARY: Current SDOH Barriers:  . Transportation  Clinical Social Work Clinical Goal(s):  Marland Kitchen None  identified  Interventions: . Discussed common feeling and emotions when being diagnosed with cancer, and the importance of support during treatment . Informed patient of the support team roles and support services at [facility] . Provided CSW contact information and encouraged patient to call with any questions or concerns   Follow Up Plan: Patient will contact CSW with any support or resource needs Patient verbalizes understanding of plan: Yes    Kennith Center , LCSW

## 2020-05-24 ENCOUNTER — Telehealth: Payer: Self-pay | Admitting: Oncology

## 2020-05-24 NOTE — Telephone Encounter (Signed)
Called to check on her. No bm since Friday. She is taking Miralax BID. She is able to drink with no problems. She is eating, but not much. She is complaining of abdominal pain due to constipation. Instructed to start Senokot 2 tabs TID, go to pharmacy and she can buy it OTC. She verbalized understanding and will start Senokot today.

## 2020-05-24 NOTE — Telephone Encounter (Signed)
Amanda Lin called and said she has had a bowel movement since taking Senakot at 11:00.  She said the pain she was having in her abdomen is better now. She wanted to let Dr. Alvy Bimler know.

## 2020-05-24 NOTE — BH Specialist Note (Deleted)
Integrated Behavioral Health via Telemedicine Visit  05/24/2020 Tia Masker. [place] [id number]  Number of [place] visits: 1 Session Start time: 10:15***  Session End time: 11:15*** Total time: {IBH Total Time:21014050}  Referring Provider: *** Patient/Family location: Home*** [facility] Provider location: [facility]  All persons participating in visit: Patient *** and [place] ***  Types of Service: {CHL AMB TYPE OF SERVICE:[phone]}  I connected with Tia Masker. Knecht and/or Tia Masker. Poster's {family members:20773} by Telephone  (Video is Caregility application) and verified that I am speaking with the correct person using two identifiers.Discussed confidentiality: {YES/NO:21197}  I discussed the limitations of telemedicine and the availability of in person appointments.  Discussed there is a possibility of technology failure and discussed alternative modes of communication if that failure occurs.  I discussed that engaging in this telemedicine visit, they consent to the provision of behavioral healthcare and the services will be billed under their insurance.  Patient and/or legal guardian expressed understanding and consented to Telemedicine visit: {YES/NO:21197}  Presenting Concerns: Patient and/or family reports the following symptoms/concerns: *** Duration of problem: ***; Severity of problem: {Mild/Moderate/Severe:20260}  Patient and/or Family's Strengths/Protective Factors: {CHL AMB BH PROTECTIVE [phone]}  Goals Addressed: Patient will: 1.  Reduce symptoms of: {IBH Symptoms:21014056}  2.  Increase knowledge and/or ability of: {IBH Patient Tools:21014057}  3.  Demonstrate ability to: {IBH Goals:21014053}  Progress towards Goals: {CHL AMB BH PROGRESS TOWARDS GOALS:[phone]}  Interventions: Interventions utilized:  {IBH Interventions:21014054} Standardized Assessments  completed: {IBH Screening Tools:21014051}  Patient and/or Family Response: ***  Assessment: Patient currently experiencing ***.   Patient may benefit from ***.  Plan: 1. Follow up with behavioral health clinician on : *** 2. Behavioral recommendations: *** 3. Referral(s): {IBH Referrals:21014055}  I discussed the assessment and treatment plan with the patient and/or parent/guardian. They were provided an opportunity to ask questions and all were answered. They agreed with the plan and demonstrated an understanding of the instructions.   They were advised to call back or seek an in-person evaluation if the symptoms worsen or if the condition fails to improve as anticipated.  Caroleen Hamman Chardonnay Holzmann, LCSW

## 2020-05-26 ENCOUNTER — Ambulatory Visit (HOSPITAL_COMMUNITY): Payer: 59

## 2020-05-27 ENCOUNTER — Ambulatory Visit: Payer: Self-pay | Admitting: Obstetrics and Gynecology

## 2020-05-27 NOTE — Progress Notes (Signed)
Pharmacist Chemotherapy Monitoring - Initial Assessment    Anticipated start date: 06/03/20  Regimen:  . Are orders appropriate based on the patient's diagnosis, regimen, and cycle? Yes . Does the plan date match the patient's scheduled date? Yes . Is the sequencing of drugs appropriate? Yes . Are the premedications appropriate for the patient's regimen? Yes . Prior Authorization for treatment is: Pending o If applicable, is the correct biosimilar selected based on the patient's insurance? not applicable  Organ Function and Labs: Marland Kitchen Are dose adjustments needed based on the patient's renal function, hepatic function, or hematologic function? Yes . Are appropriate labs ordered prior to the start of patient's treatment? Yes . Other organ system assessment, if indicated: N/A . The following baseline labs, if indicated, have been ordered: cisplatin: K, Mg  Dose Assessment: . Are the drug doses appropriate? Yes . Are the following correct: o Drug concentrations Yes o IV fluid compatible with drug Yes o Administration routes Yes o Timing of therapy Yes . If applicable, does the patient have documented access for treatment and/or plans for port-a-cath placement? yes . If applicable, have lifetime cumulative doses been properly documented and assessed? not applicable Lifetime Dose Tracking  No doses have been documented on this patient for the following tracked chemicals: Doxorubicin, Epirubicin, Idarubicin, Daunorubicin, Mitoxantrone, Bleomycin, Oxaliplatin, Carboplatin, Liposomal Doxorubicin  o   Toxicity Monitoring/Prevention: . The patient has the following take home antiemetics prescribed: Prochlorperazine . The patient has the following take home medications prescribed: N/A . Medication allergies and previous infusion related reactions, if applicable, have been reviewed and addressed. Yes . The patient's current medication list has been assessed for drug-drug interactions with their  chemotherapy regimen. no significant drug-drug interactions were identified on review.  Order Review: . Are the treatment plan orders signed? No . Is the patient scheduled to see a provider prior to their treatment? Yes  I verify that I have reviewed each item in the above checklist and answered each question accordingly.  Philomena Course 05/27/2020 9:48 AM

## 2020-05-30 ENCOUNTER — Ambulatory Visit (HOSPITAL_COMMUNITY)
Admission: RE | Admit: 2020-05-30 | Discharge: 2020-05-30 | Disposition: A | Discharge status: Home / Self Care | Payer: 59 | Source: Ambulatory Visit | Attending: Gynecologic Oncology | Admitting: Gynecologic Oncology

## 2020-05-30 ENCOUNTER — Other Ambulatory Visit: Payer: Self-pay | Admitting: Hematology and Oncology

## 2020-05-30 ENCOUNTER — Other Ambulatory Visit: Payer: Self-pay

## 2020-05-30 DIAGNOSIS — C541 Malignant neoplasm of endometrium: Secondary | ICD-10-CM

## 2020-05-30 LAB — GLUCOSE, CAPILLARY: Glucose-Capillary: 167 mg/dL — ABNORMAL HIGH (ref 70–99)

## 2020-05-30 MED ORDER — FLUDEOXYGLUCOSE F - 18 (FDG) INJECTION
13.0900 | Freq: Once | INTRAVENOUS | Status: AC | PRN
  Administered 2020-05-30: 13.09 via INTRAVENOUS

## 2020-05-30 NOTE — Telephone Encounter (Signed)
Tnia called and said her primary doctor is Dr. Phill Myron.  Advised her that her appointment is not until 06/08/20 with Dr. Juleen China.  Netra said she will call her office to see if the appointment could be moved up.

## 2020-05-31 ENCOUNTER — Inpatient Hospital Stay (HOSPITAL_BASED_OUTPATIENT_CLINIC_OR_DEPARTMENT_OTHER): Payer: 59 | Admitting: Hematology and Oncology

## 2020-05-31 ENCOUNTER — Inpatient Hospital Stay: Payer: 59

## 2020-05-31 ENCOUNTER — Other Ambulatory Visit: Payer: Self-pay | Admitting: Hematology and Oncology

## 2020-05-31 ENCOUNTER — Other Ambulatory Visit: Payer: Self-pay | Admitting: Radiology

## 2020-05-31 ENCOUNTER — Other Ambulatory Visit: Payer: Self-pay | Admitting: Internal Medicine

## 2020-05-31 ENCOUNTER — Encounter: Payer: Self-pay | Admitting: Hematology and Oncology

## 2020-05-31 ENCOUNTER — Telehealth: Payer: Self-pay

## 2020-05-31 VITALS — BP 183/101 | HR 111 | Temp 97.4°F | Resp 18 | Ht 61.0 in | Wt 258.8 lb

## 2020-05-31 DIAGNOSIS — C541 Malignant neoplasm of endometrium: Secondary | ICD-10-CM

## 2020-05-31 DIAGNOSIS — G893 Neoplasm related pain (acute) (chronic): Secondary | ICD-10-CM

## 2020-05-31 DIAGNOSIS — Z7189 Other specified counseling: Secondary | ICD-10-CM

## 2020-05-31 DIAGNOSIS — N2889 Other specified disorders of kidney and ureter: Secondary | ICD-10-CM

## 2020-05-31 DIAGNOSIS — C779 Secondary and unspecified malignant neoplasm of lymph node, unspecified: Secondary | ICD-10-CM | POA: Insufficient documentation

## 2020-05-31 DIAGNOSIS — C778 Secondary and unspecified malignant neoplasm of lymph nodes of multiple regions: Secondary | ICD-10-CM

## 2020-05-31 DIAGNOSIS — K5909 Other constipation: Secondary | ICD-10-CM

## 2020-05-31 DIAGNOSIS — C7800 Secondary malignant neoplasm of unspecified lung: Secondary | ICD-10-CM

## 2020-05-31 LAB — CBC WITH DIFFERENTIAL/PLATELET
Abs Immature Granulocytes: 0.03 10*3/uL (ref 0.00–0.07)
Basophils Absolute: 0 10*3/uL (ref 0.0–0.1)
Basophils Relative: 0 %
Eosinophils Absolute: 0 10*3/uL (ref 0.0–0.5)
Eosinophils Relative: 0 %
HCT: 37.2 % (ref 36.0–46.0)
Hemoglobin: 11.7 g/dL — ABNORMAL LOW (ref 12.0–15.0)
Immature Granulocytes: 0 %
Lymphocytes Relative: 6 %
Lymphs Abs: 0.8 10*3/uL (ref 0.7–4.0)
MCH: 22.9 pg — ABNORMAL LOW (ref 26.0–34.0)
MCHC: 31.5 g/dL (ref 30.0–36.0)
MCV: 72.8 fL — ABNORMAL LOW (ref 80.0–100.0)
Monocytes Absolute: 1.1 10*3/uL — ABNORMAL HIGH (ref 0.1–1.0)
Monocytes Relative: 9 %
Neutro Abs: 10.4 10*3/uL — ABNORMAL HIGH (ref 1.7–7.7)
Neutrophils Relative %: 85 %
Platelets: 796 10*3/uL — ABNORMAL HIGH (ref 150–400)
RBC: 5.11 MIL/uL (ref 3.87–5.11)
RDW: 15.5 % (ref 11.5–15.5)
WBC: 12.4 10*3/uL — ABNORMAL HIGH (ref 4.0–10.5)
nRBC: 0 % (ref 0.0–0.2)

## 2020-05-31 LAB — COMPREHENSIVE METABOLIC PANEL
ALT: 7 U/L (ref 0–44)
AST: 10 U/L — ABNORMAL LOW (ref 15–41)
Albumin: 3 g/dL — ABNORMAL LOW (ref 3.5–5.0)
Alkaline Phosphatase: 79 U/L (ref 38–126)
Anion gap: 13 (ref 5–15)
BUN: 13 mg/dL (ref 8–23)
CO2: 25 mmol/L (ref 22–32)
Calcium: 9.2 mg/dL (ref 8.9–10.3)
Chloride: 101 mmol/L (ref 98–111)
Creatinine, Ser: 0.99 mg/dL (ref 0.44–1.00)
GFR, Estimated: >60 mL/min (ref >60)
Glucose, Bld: 152 mg/dL — ABNORMAL HIGH (ref 70–99)
Potassium: 4.2 mmol/L (ref 3.5–5.1)
Sodium: 139 mmol/L (ref 135–145)
Total Bilirubin: 0.4 mg/dL (ref 0.3–1.2)
Total Protein: 7.5 g/dL (ref 6.5–8.1)

## 2020-05-31 LAB — MAGNESIUM: Magnesium: 2 mg/dL (ref 1.7–2.4)

## 2020-05-31 MED ORDER — LOSARTAN POTASSIUM 50 MG PO TABS: 50.0000 mg | ORAL_TABLET | Freq: Every day | ORAL | 1 refills | Status: DC

## 2020-05-31 MED ORDER — PROCHLORPERAZINE MALEATE 10 MG PO TABS: 10.0000 mg | ORAL_TABLET | Freq: Four times a day (QID) | ORAL | 1 refills | Status: DC | PRN

## 2020-05-31 MED ORDER — DEXAMETHASONE 4 MG PO TABS: ORAL_TABLET | ORAL | 6 refills | Status: DC

## 2020-05-31 MED ORDER — ONDANSETRON HCL 8 MG PO TABS: 8.0000 mg | ORAL_TABLET | Freq: Three times a day (TID) | ORAL | 1 refills | Status: DC | PRN

## 2020-05-31 MED ORDER — LIDOCAINE-PRILOCAINE 2.5-2.5 % EX CREA: TOPICAL_CREAM | CUTANEOUS | 3 refills | Status: DC

## 2020-05-31 NOTE — Assessment & Plan Note (Signed)
Unfortunately, PET CT scan showed diffuse metastatic disease It is difficult to tell whether the metastatic disease is due to uterine cancer or kidney cancer At this point in time, the only definitive biopsy obtained confirmed metastatic uterine cancer I have reviewed her case with GYN oncologist The plan of care moving forward would be to proceed with palliative chemotherapy with combination of carboplatin, paclitaxel and trastuzumab This is different than previously discussed curative intent concurrent chemoradiation therapy with cisplatin  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   Long term side-effects are also discussed including risks of infertility, permanent damage to nerve function, hearing loss, chronic fatigue, kidney damage with possibility needing hemodialysis, and rare secondary malignancy including bone marrow disorders.  The patient is aware that the response rates discussed earlier is not guaranteed.  After a long discussion, patient made an informed decision to proceed with the prescribed plan of care.   Patient education material was dispensed. We discussed premedication with dexamethasone before chemotherapy. I will get pretreatment echocardiogram to rule out congestive heart failure She will proceed with port placement tomorrow I plan to see her again next week for further follow-up and assess toxicity

## 2020-05-31 NOTE — H&P (Signed)
Chief Complaint: Patient was seen in consultation today for port-a-catheter placement.   Referring Physician(s): [name]  Supervising Physician: Daryll Brod  Patient Status: [facility] - Out-pt  History of Present Illness: Amanda Lin is a 65 y.o. female with a medical history significant for HTN and DM. She presented to the ED October 2021 with lower abdominal pain, dysuria and hematuria. Imaging revealed a possible right renal mass and additional work up showed this to be a benign hemorrhagic cyst. Her pain persisted and she started having post-menopausal bleeding. A vaginal ultrasound showed a thickened endometrium with a biopsy positive for endometrial cancer. A PET scan done 05/31/20 shows metastatic disease and the patient will begin palliative chemotherapy soon.      Interventional Radiology has been asked to evaluate this patient for an image-guided port-a-catheter placement to facilitate her treatment goals.   Past Medical History:  Diagnosis Date  . Diabetes mellitus without complication ([place])   . Endometrial cancer ([place])   . Hypertension     Past Surgical History:  Procedure Laterality Date  . TUBAL LIGATION Bilateral     Allergies: Patient has no known allergies.  Medications: Prior to Admission medications   Medication Sig Start Date End Date Taking? Authorizing Provider  dexamethasone (DECADRON) 4 MG tablet Take 2 tabs at the night before and 2 tab the morning of chemotherapy, every 3 weeks, by mouth x 6 cycles 05/31/20   [name], MD  lidocaine-prilocaine (EMLA) cream Apply to affected area once 05/31/20   [name], MD  losartan (COZAAR) 50 MG tablet Take 1 tablet (50 mg total) by mouth daily. 05/31/20   Nicolette Bang, DO  ondansetron (ZOFRAN) 8 MG tablet Take 1 tablet (8 mg total) by mouth every 8 (eight) hours as needed for refractory nausea / vomiting. Start on day 3 after carboplatin chemo. 05/31/20   [name], MD  oxyCODONE (OXY  IR/ROXICODONE) 5 MG immediate release tablet Take 1 tablet (5 mg total) by mouth every 4 (four) hours as needed for severe pain. 05/20/20   [name], MD  polyethylene glycol (MIRALAX / GLYCOLAX) 17 g packet Take 17 g by mouth daily.    [provider]  prochlorperazine (COMPAZINE) 10 MG tablet Take 1 tablet (10 mg total) by mouth every 6 (six) hours as needed (Nausea or vomiting). 05/31/20   [name], MD     Family History  Problem Relation Age of Onset  . Cancer Mother        uterine diagnosed around 53  . Prostate cancer Other     Social History   Socioeconomic History  . Marital status: Married    Spouse name: Richard  . Number of children: 2  . Years of education: Not on file  . Highest education level: Not on file  Occupational History  . Occupation: retired  Tobacco Use  . Smoking status: Never Smoker  . Smokeless tobacco: Never Used  Vaping Use  . Vaping Use: Never used  Substance and Sexual Activity  . Alcohol use: Yes    Comment: ocassionally  . Drug use: No  . Sexual activity: Yes  Other Topics Concern  . Not on file  Social History Narrative  . Not on file   Social Determinants of Health   Financial Resource Strain: Not on file  Food Insecurity: Food Insecurity Present  . Worried About Charity fundraiser in the Last Year: Often true  . Ran Out of Food in the Last Year:  Sometimes true  Transportation Needs: No Transportation Needs  . Lack of Transportation (Medical): No  . Lack of Transportation (Non-Medical): No  Physical Activity: Not on file  Stress: Not on file  Social Connections: Not on file    Review of Systems: A 12 point ROS discussed and pertinent positives are indicated in the HPI above.  All other systems are negative.  Review of Systems  Constitutional: Positive for appetite change and fatigue.  Respiratory: Negative for cough and shortness of breath.   Cardiovascular: Positive for leg swelling. Negative for chest pain.   Gastrointestinal: Positive for nausea. Negative for abdominal pain, diarrhea and vomiting.  Genitourinary: Negative for vaginal bleeding.  Musculoskeletal: Negative for back pain.  Neurological: Negative for headaches.  Hematological: Does not bruise/bleed easily.    Vital Signs: BP (!) 192/106   Pulse (!) 110   Temp 97.9 F (36.6 C) (Oral)   SpO2 96%   Physical Exam Constitutional:      General: She is not in acute distress.    Appearance: She is obese.  HENT:     Mouth/Throat:     Mouth: Mucous membranes are moist.     Pharynx: Oropharynx is clear.  Cardiovascular:     Rate and Rhythm: Regular rhythm. Tachycardia present.     Pulses: Normal pulses.     Heart sounds: Normal heart sounds.  Pulmonary:     Effort: Pulmonary effort is normal.     Breath sounds: Normal breath sounds.  Abdominal:     General: Bowel sounds are normal.     Palpations: Abdomen is soft.  Musculoskeletal:     Right lower leg: Edema present.     Left lower leg: Edema present.  Skin:    General: Skin is warm and dry.  Neurological:     Mental Status: She is alert and oriented to person, place, and time.  Psychiatric:        Mood and Affect: Mood is anxious. Affect is tearful.     Imaging: NM PET Image Initial (PI) Skull Base To Thigh  Result Date: 05/30/2020 CLINICAL DATA:  Initial treatment strategy for endometrial cancer. Also history of enhancing renal mass lower pole right kidney. EXAM: NUCLEAR MEDICINE PET SKULL BASE TO THIGH TECHNIQUE: 13.1 mCi F-18 FDG was injected intravenously. Full-ring PET imaging was performed from the skull base to thigh after the radiotracer. CT data was obtained and used for attenuation correction and anatomic localization. Fasting blood glucose: 167 mg/dl COMPARISON:  Abdomen/pelvis CT 03/28/2020. FINDINGS: Mediastinal blood pool activity: SUV max 2.8 Liver activity: SUV max NA NECK: Hypermetabolic supraclavicular lymphadenopathy identified on the left. Incidental  CT findings: None. CHEST: 10 mm short axis node towards the thoracic inlet (49/4) demonstrates SUV max = 7.6. Hypermetabolic lymph nodes are seen in the anterior mediastinum. 8 mm lymph node between the esophagus in the aorta on image 70 of series 4 demonstrates SUV max = 6.6. Hypermetabolic lymph nodes are seen in the AP window and axillary regions. 10 mm right retrocrural node on 98/4 demonstrates SUV max = 6.0. 12 mm left upper lobe pulmonary nodule on 75/4 demonstrates SUV max = 4.5. Incidental CT findings: Bilateral collapse/consolidative opacity seen in the lung bases. Tiny bilateral pleural effusions noted. ABDOMEN/PELVIS: 2 cm plaque-like peritoneal lesion anterior to the left liver demonstrates SUV max = 7.3 Omental disease in the right paramidline abdomen is not well demonstrated on noncontrast CT imaging with SUV max = 10.9 (CT image 136/4). Midline omental disease in the  anterior pelvis is not well demonstrated on noncontrast CT imaging with SUV max = 14. 15 mm left para-aortic node on [id number] is hypermetabolic. 13 mm left pelvic sidewall lymph node on [id number] is hypermetabolic with SUV max = 8.9. Diffuse hypermetabolism noted in the uterus with SUV max = of 13.1. Incidental CT findings: Moderate volume ascites is new in the interval. Gallbladder surgically absent. 13 mm hypoattenuating lesion posterior lower pole right kidney is stable. Lesion of concern in the anterior lower pole right kidney is obscured by background renal parenchymal uptake. SKELETON: Scattered areas of hypermetabolism in the bony anatomy are suspicious for marrow involvement. Index uptake in the right aspect of the S1 vertebral body demonstrates SUV max = 5.0 with no underlying osseous abnormality by CT imaging. Focal uptake in the posterior left iliac crest demonstrates SUV max = 5.1 with no discrete lesion by CT. Incidental CT findings: No worrisome lytic or sclerotic osseous abnormality. IMPRESSION: 1. Hypermetabolic lymph nodes  identified in the left supraclavicular region/thoracic inlet, mediastinum, hilar regions, right retrocrural space, para-aortic retroperitoneum, and bilateral pelvic sidewall, compatible with metastatic disease. 2. Moderate volume ascites on this noncontrast CT study largely obscures peritoneal and omental disease although hypermetabolic plaque-like uptake in the anterior high abdomen is probably peritoneal with relatively bulky hypermetabolism in the omentum. These findings are also compatible with metastatic disease. 3. Hypermetabolic left upper lobe pulmonary nodule consistent with metastatic disease. Lung primary is considered less likely but not excluded. 4. Scattered areas of focal marrow uptake are suspicious for bony metastases although no underlying abnormality is evident on CT imaging. 5. Diffuse uptake in the uterine anatomy compatible with the patient's known history of endometrial carcinoma. 6. As above, moderate volume ascites is new in the interval. 7. Enhancing lesion lower pole right kidney seen on previous CT of 03/28/2020 is less conspicuous on today's noncontrast CT imaging. Diffuse normal background FDG accumulation could obscure hypermetabolism in this lesion previously characterized as suspicious for renal cell carcinoma. 8. Bibasilar collapse/consolidation in the lower lobes. Electronically Signed   By: Misty Stanley M.D.   On: 05/30/2020 14:39    Labs:  CBC: Recent Labs    04/25/20 0948 05/11/20 1510 05/16/20 0911 05/31/20 1008  WBC 9.7 8.6 11.5* 12.4*  HGB 11.6 11.0* 10.7* 11.7*  HCT 37.1 35.7* 34.2* 37.2  PLT 555* 570* 546* 796*    COAGS: No results for input(s): INR, APTT in the last 8760 hours.  BMP: Recent Labs    02/23/20 1113 02/26/20 1007 04/04/20 1300 05/11/20 1510 05/31/20 1008  NA 138 136 140 144 139  K 4.2 4.3 3.3* 3.9 4.2  CL 102 101 105 109 101  CO2 19* 25 22 23 25   GLUCOSE 138* 136* 134* 108* 152*  BUN 15 12 10 12 13   CALCIUM 9.6 9.3 9.5 9.5  9.2  CREATININE 0.80 0.72 0.77 0.89 0.99  GFRNONAA 79 >60 >60 >60 >60  GFRAA 91  --   --   --   --     LIVER FUNCTION TESTS: Recent Labs    02/26/20 1007 04/04/20 1300 05/11/20 1510 05/31/20 1008  BILITOT 1.3* 0.7 0.5 0.4  AST 23 13* 13* 10*  ALT 15 13 11 7   ALKPHOS 83 70 70 79  PROT 7.9 7.2 7.4 7.5  ALBUMIN 3.7 3.7 3.4* 3.0*    TUMOR MARKERS: No results for input(s): AFPTM, CEA, CA199, CHROMGRNA in the last 8760 hours.  Assessment and Plan:  Metastatic endometrial cancer: Amanda Lin, 65 year old  female, presents today to the [facility] department for an image-guided port-a-catheter placement.   Risks and benefits of an image-guided port-a-catheter placement were discussed with the patient including, but not limited to bleeding, infection, pneumothorax, or fibrin sheath development and need for additional procedures.  All of the patient's questions were answered, patient is agreeable to proceed.  Consent signed and in chart.  Thank you for this interesting consult.  I greatly enjoyed meeting Amanda Lin and look forward to participating in their care.  A copy of this report was sent to the requesting provider on this date.  Electronically Signed: Soyla Dryer, AGACNP-BC [phone] 06/01/2020, 10:37 AM   I spent a total of  30 Minutes   in face to face in clinical consultation, greater than 50% of which was counseling/coordinating care for port-a-catheter placement.

## 2020-05-31 NOTE — Assessment & Plan Note (Signed)
This has not been fully evaluated but given the severity of her PET CT scan, we will not delay chemotherapy I plan to repeat serial imaging to follow

## 2020-05-31 NOTE — Progress Notes (Signed)
DISCONTINUE OFF PATHWAY REGIMEN - Uterine   [id number]:Cisplatin 40 mg/m2 IV D1 q7 Days + RT:   A cycle is every 7 days:     Cisplatin   **Always confirm dose/schedule in your pharmacy ordering system**  REASON: Other Reason PRIOR TREATMENT: Off Pathway: Cisplatin 40 mg/m2 IV D1 q7 Days + RT TREATMENT RESPONSE: Unable to Evaluate  START ON PATHWAY REGIMEN - Uterine     Cycle 1: A cycle is 21 days:     Trastuzumab-xxxx      Paclitaxel      Carboplatin    Cycles 2 through 6: A cycle is every 21 days:     Trastuzumab-xxxx      Paclitaxel      Carboplatin    Cycles 7 and beyond: A cycle is every 21 days:     Trastuzumab-xxxx   **Always confirm dose/schedule in your pharmacy ordering system**  Patient Characteristics: Serous Carcinoma, Newly Diagnosed (Clinical Staging), Nonsurgical Candidate, Stage III or IV, HER2 Positive Histology: Serous Carcinoma Therapeutic Status: Newly Diagnosed (Clinical Staging) AJCC M Category: cM1 Surgical Candidacy: Nonsurgical Candidate AJCC 8 Stage Grouping: IVB AJCC T Category: cT3 AJCC N Category: cN2 HER2 Status: Positive  Intent of Therapy: Non-Curative / Palliative Intent, Discussed with Patient

## 2020-05-31 NOTE — Telephone Encounter (Signed)
1) Medication(s) Requested (by name):losartan (COZAAR) 50 MG tablet [[id number]   2) Pharmacy of Choice:[facility], [address] AT [address]  [place], [place] [address]  3) Special Requests:   Approved medications will be sent to the pharmacy, we will reach out if there is an issue.  Requests made after 3pm may not be addressed until the following business day!  If a patient is unsure of the name of the medication(s) please note and ask patient to call back when they are able to provide all info, do not send to responsible party until all information is available!

## 2020-05-31 NOTE — Progress Notes (Signed)
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, PET CT scan showed diffuse metastatic disease It is difficult to tell whether the metastatic disease is due to uterine cancer or kidney cancer At this point in time, the only definitive biopsy obtained confirmed metastatic uterine cancer I have reviewed her case with GYN oncologist The plan of care moving forward would be to proceed with palliative chemotherapy with combination of carboplatin, paclitaxel and trastuzumab This is different than previously discussed curative intent concurrent chemoradiation therapy with cisplatin  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   Long term side-effects are also discussed including risks of infertility, permanent damage to nerve function, hearing loss, chronic fatigue, kidney damage with possibility needing hemodialysis, and rare secondary malignancy including bone marrow disorders.  The patient is aware that the response rates discussed earlier is not guaranteed.  After a long discussion, patient made an informed decision to proceed with the prescribed plan of care.   Patient education material was dispensed. We discussed premedication with dexamethasone  before chemotherapy. I will get pretreatment echocardiogram to rule out congestive heart failure She will proceed with port placement tomorrow I plan to see her again next week for further follow-up and assess toxicity    Cancer associated pain Her pain is well controlled so far We discussed narcotic refill policy  Other constipation She has severe constipation We discussed the importance of laxative therapy  Right renal mass This has not been fully evaluated but given the severity of her PET CT scan, we will not delay chemotherapy I plan to repeat serial imaging to follow  Metastasis to lung [facility]) Her oxygen saturation is normal Observe closely for now  Goals of care, counseling/discussion Due to new findings of metastatic disease, the treatment goal is switched to palliative   No orders of the defined types were placed in this encounter.   All questions were answered. The patient knows to call the clinic with any problems, questions or concerns. The total time spent in the appointment was 55 minutes encounter with patients including review of chart and various tests results, discussions about plan of care and coordination of care plan   [name], MD 05/31/2020 2:08 PM  INTERVAL HISTORY: Please see below for problem oriented charting. 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     02/26/2020 Imaging  1. 3 mm nonobstructing renal stone within the left kidney. 2. Findings suggestive  of a small hemorrhagic cyst within the lower pole of the right kidney, with an anterior right lower pole heterogeneous renal soft tissue mass. MRI correlation is recommended, as an underlying neoplastic process cannot be excluded. 3. Colonic diverticulosis. 4. Multiple exophytic uterine fibroids. 5. Evidence of prior cholecystectomy.   03/28/2020 Imaging   2.4 cm enhancing mass in the anterior right lower kidney, suspicious for solid renal neoplasm such as renal cell carcinoma.   Single right renal artery and vein.  No renal vein invasion.   Small retroperitoneal nodes, measuring up to 9 mm short axis, mildly prominent but technically within the upper limits of normal. Attention on follow-up is suggested.   4 x 5 mm nodule in the medial right lower lobe. This is nonspecific and unlikely to reflect a metastasis but warrants attention on follow-up. Consider dedicated CT chest in 3-6 months.   3 mm nonobstructing left lower pole renal calculus. No hydronephrosis.     04/04/2020 Imaging   1. 10 mm endometrial stripe thickness. This is considered abnormal in a postmenopausal female with bleeding. In the setting of post-menopausal bleeding, endometrial sampling is indicated to exclude carcinoma.  2. Multiple uterine fibroids including potential submucosal fibroid in the lower uterine segment.   04/19/2020 Pathology Results   A. ENDOMETRIUM, BIOPSY:  -  High-grade carcinoma  Immunohistochemical and morphometric analysis performed manually   The tumor cells are POSITIVE for Her2 (3+).    04/19/2020 Procedure   She was seen by Dr. Astrid Drafts on 04/19/2020.  At that visit her cervix was noted to be nodular and firm and abnormal appearing.  Pap taken at that visit was positive for adenocarcinoma, endometrial.  An endometrial [place] biopsy was taken at the same visit which revealed high-grade endometrial cancer, suspicious for serous carcinoma.     05/12/2020 Cancer Staging   Staging form: Corpus  Uteri - Carcinoma and Carcinosarcoma, AJCC 8th Edition - Clinical stage from 05/12/2020: Stage IIIC2 (cT3, cN2, cM0) - Signed by [name], MD on 05/12/2020   05/30/2020 PET scan   1. Hypermetabolic lymph nodes identified in the left supraclavicular region/thoracic inlet, mediastinum, hilar regions, right retrocrural space, para-aortic retroperitoneum, and bilateral pelvic sidewall, compatible with metastatic disease. 2. Moderate volume ascites on this noncontrast CT study largely obscures peritoneal and omental disease although hypermetabolic plaque-like uptake in the anterior high abdomen is probably peritoneal with relatively bulky hypermetabolism in the omentum. These findings are also compatible with metastatic disease. 3. Hypermetabolic left upper lobe pulmonary nodule consistent with metastatic disease. Lung primary is considered less likely but not excluded. 4. Scattered areas of focal marrow uptake are suspicious for bony metastases although no underlying abnormality is evident on CT imaging. 5. Diffuse uptake in the uterine anatomy compatible with the patient's known history of endometrial carcinoma. 6. As above, moderate volume ascites is new in the interval. 7. Enhancing lesion lower pole right kidney seen on previous CT of 03/28/2020 is less conspicuous on today's noncontrast CT imaging. Diffuse normal background FDG accumulation could obscure hypermetabolism in this lesion previously characterized as suspicious for renal cell carcinoma. 8. Bibasilar collapse/consolidation in the lower lobes.   06/03/2020 -  Chemotherapy    Patient is on Treatment Plan: UTERINE SEROUS CARCINOMA CARBOPLATIN + PACLITAXEL + TRASTUZUMAB Q21D X 6 CYCLES / TRASTUZUMAB Q21D      Metastasis to lymph nodes ([place])  05/31/2020 Initial Diagnosis   Metastasis to lymph nodes ([place])  06/03/2020 -  Chemotherapy    Patient is on Treatment Plan: UTERINE SEROUS CARCINOMA CARBOPLATIN + PACLITAXEL + TRASTUZUMAB Q21D X 6  CYCLES / TRASTUZUMAB Q21D      Metastasis to lung ([place])  05/31/2020 Initial Diagnosis   Metastasis to lung ([place])   06/03/2020 -  Chemotherapy    Patient is on Treatment Plan: UTERINE SEROUS CARCINOMA CARBOPLATIN + PACLITAXEL + TRASTUZUMAB Q21D X 6 CYCLES / TRASTUZUMAB Q21D        REVIEW OF SYSTEMS:   Constitutional: Denies fevers, chills or abnormal weight loss Eyes: Denies blurriness of vision Ears, nose, mouth, throat, and face: Denies mucositis or sore throat Respiratory: Denies cough, dyspnea or wheezes Cardiovascular: Denies palpitation, chest discomfort or lower extremity swelling Skin: Denies abnormal skin rashes Lymphatics: Denies new lymphadenopathy or easy bruising Neurological:Denies numbness, tingling or new weaknesses Behavioral/Psych: Mood is stable, no new changes  All other systems were reviewed with the patient and are negative.  I have reviewed the past medical history, past surgical history, social history and family history with the patient and they are unchanged from previous note.  ALLERGIES:  has No Known Allergies.  MEDICATIONS:  Current Outpatient Medications  Medication Sig Dispense Refill  . dexamethasone (DECADRON) 4 MG tablet Take 2 tabs at the night before and 2 tab the morning of chemotherapy, every 3 weeks, by mouth x 6 cycles 36 tablet 6  . lidocaine-prilocaine (EMLA) cream Apply to affected area once 30 g 3  . losartan (COZAAR) 50 MG tablet Take 50 mg by mouth daily.    . ondansetron (ZOFRAN) 8 MG tablet Take 1 tablet (8 mg total) by mouth every 8 (eight) hours as needed for refractory nausea / vomiting. Start on day 3 after carboplatin chemo. 30 tablet 1  . oxyCODONE (OXY IR/ROXICODONE) 5 MG immediate release tablet Take 1 tablet (5 mg total) by mouth every 4 (four) hours as needed for severe pain. 60 tablet 0  . polyethylene glycol (MIRALAX / GLYCOLAX) 17 g packet Take 17 g by mouth daily.    . prochlorperazine (COMPAZINE) 10 MG tablet Take 1  tablet (10 mg total) by mouth every 6 (six) hours as needed (Nausea or vomiting). 30 tablet 1   No current facility-administered medications for this visit.    PHYSICAL EXAMINATION: ECOG PERFORMANCE STATUS: 1 - Symptomatic but completely ambulatory  Vitals:   05/31/20 1052  BP: (!) 183/101  Pulse: (!) 111  Resp: 18  Temp: (!) 97.4 F (36.3 C)  SpO2: 95%   Filed Weights   05/31/20 1052  Weight: 258 lb 12.8 oz (117.4 kg)    GENERAL:alert, no distress and comfortable NEURO: alert & oriented x 3 with fluent speech, no focal motor/sensory deficits  LABORATORY DATA:  I have reviewed the data as listed    Component Value Date/Time   NA 139 05/31/2020 1008   NA 138 02/23/2020 1113   K 4.2 05/31/2020 1008   CL 101 05/31/2020 1008   CO2 25 05/31/2020 1008   GLUCOSE 152 (H) 05/31/2020 1008   BUN 13 05/31/2020 1008   BUN 15 02/23/2020 1113   CREATININE 0.99 05/31/2020 1008   CALCIUM 9.2 05/31/2020 1008   PROT 7.5 05/31/2020 1008   PROT 7.8 02/23/2020 1113   ALBUMIN 3.0 (L) 05/31/2020 1008   ALBUMIN 4.4 02/23/2020 1113   AST 10 (L) 05/31/2020 1008   ALT 7 05/31/2020 1008   ALKPHOS 79 05/31/2020 1008   BILITOT 0.4 05/31/2020 1008   BILITOT 0.6 02/23/2020  [place] 05/31/2020 1008   GFRAA 91 02/23/2020 1113    No results found for: SPEP, UPEP  Lab Results  Component Value Date   WBC 12.4 (H) 05/31/2020   NEUTROABS 10.4 (H) 05/31/2020   HGB 11.7 (L) 05/31/2020   HCT 37.2 05/31/2020   MCV 72.8 (L) 05/31/2020   PLT 796 (H) 05/31/2020      Chemistry      Component Value Date/Time   NA 139 05/31/2020 1008   NA 138 02/23/2020 1113   K 4.2 05/31/2020 1008   CL 101 05/31/2020 1008   CO2 25 05/31/2020 1008   BUN 13 05/31/2020 1008   BUN 15 02/23/2020 1113   CREATININE 0.99 05/31/2020 1008      Component Value Date/Time   CALCIUM 9.2 05/31/2020 1008   ALKPHOS 79 05/31/2020 1008   AST 10 (L) 05/31/2020 1008   ALT 7 05/31/2020 1008   BILITOT 0.4  05/31/2020 1008   BILITOT 0.6 02/23/2020 1113       RADIOGRAPHIC STUDIES: I have reviewed multiple imaging studies with the patient and her daughter I have personally reviewed the radiological images as listed and agreed with the findings in the report. NM PET Image Initial (PI) Skull Base To Thigh  Result Date: 05/30/2020 CLINICAL DATA:  Initial treatment strategy for endometrial cancer. Also history of enhancing renal mass lower pole right kidney. EXAM: NUCLEAR MEDICINE PET SKULL BASE TO THIGH TECHNIQUE: 13.1 mCi F-18 FDG was injected intravenously. Full-ring PET imaging was performed from the skull base to thigh after the radiotracer. CT data was obtained and used for attenuation correction and anatomic localization. Fasting blood glucose: 167 mg/dl COMPARISON:  Abdomen/pelvis CT 03/28/2020. FINDINGS: Mediastinal blood pool activity: SUV max 2.8 Liver activity: SUV max NA NECK: Hypermetabolic supraclavicular lymphadenopathy identified on the left. Incidental CT findings: None. CHEST: 10 mm short axis node towards the thoracic inlet (49/4) demonstrates SUV max = 7.6. Hypermetabolic lymph nodes are seen in the anterior mediastinum. 8 mm lymph node between the esophagus in the aorta on image 70 of series 4 demonstrates SUV max = 6.6. Hypermetabolic lymph nodes are seen in the AP window and axillary regions. 10 mm right retrocrural node on 98/4 demonstrates SUV max = 6.0. 12 mm left upper lobe pulmonary nodule on 75/4 demonstrates SUV max = 4.5. Incidental CT findings: Bilateral collapse/consolidative opacity seen in the lung bases. Tiny bilateral pleural effusions noted. ABDOMEN/PELVIS: 2 cm plaque-like peritoneal lesion anterior to the left liver demonstrates SUV max = 7.3 Omental disease in the right paramidline abdomen is not well demonstrated on noncontrast CT imaging with SUV max = 10.9 (CT image 136/4). Midline omental disease in the anterior pelvis is not well demonstrated on noncontrast CT imaging  with SUV max = 14. 15 mm left para-aortic node on 924/4 is hypermetabolic. 13 mm left pelvic sidewall lymph node on 628/6 is hypermetabolic with SUV max = 8.9. Diffuse hypermetabolism noted in the uterus with SUV max = of 13.1. Incidental CT findings: Moderate volume ascites is new in the interval. Gallbladder surgically absent. 13 mm hypoattenuating lesion posterior lower pole right kidney is stable. Lesion of concern in the anterior lower pole right kidney is obscured by background renal parenchymal uptake. SKELETON: Scattered areas of hypermetabolism in the bony anatomy are suspicious for marrow involvement. Index uptake in the right aspect of the S1 vertebral body demonstrates SUV max = 5.0 with no underlying osseous abnormality by CT imaging. Focal uptake in the posterior  left iliac crest demonstrates SUV max = 5.1 with no discrete lesion by CT. Incidental CT findings: No worrisome lytic or sclerotic osseous abnormality. IMPRESSION: 1. Hypermetabolic lymph nodes identified in the left supraclavicular region/thoracic inlet, mediastinum, hilar regions, right retrocrural space, para-aortic retroperitoneum, and bilateral pelvic sidewall, compatible with metastatic disease. 2. Moderate volume ascites on this noncontrast CT study largely obscures peritoneal and omental disease although hypermetabolic plaque-like uptake in the anterior high abdomen is probably peritoneal with relatively bulky hypermetabolism in the omentum. These findings are also compatible with metastatic disease. 3. Hypermetabolic left upper lobe pulmonary nodule consistent with metastatic disease. Lung primary is considered less likely but not excluded. 4. Scattered areas of focal marrow uptake are suspicious for bony metastases although no underlying abnormality is evident on CT imaging. 5. Diffuse uptake in the uterine anatomy compatible with the patient's known history of endometrial carcinoma. 6. As above, moderate volume ascites is new in the  interval. 7. Enhancing lesion lower pole right kidney seen on previous CT of 03/28/2020 is less conspicuous on today's noncontrast CT imaging. Diffuse normal background FDG accumulation could obscure hypermetabolism in this lesion previously characterized as suspicious for renal cell carcinoma. 8. Bibasilar collapse/consolidation in the lower lobes. Electronically Signed   By: Misty Stanley M.D.   On: 05/30/2020 14:39

## 2020-05-31 NOTE — Assessment & Plan Note (Signed)
Due to new findings of metastatic disease, the treatment goal is switched to palliative

## 2020-05-31 NOTE — Assessment & Plan Note (Signed)
She has severe constipation We discussed the importance of laxative therapy

## 2020-05-31 NOTE — Telephone Encounter (Signed)
Refill sent.   Phill Myron, D.O. Primary Care at [facility]  05/31/2020, 4:24 PM

## 2020-05-31 NOTE — Assessment & Plan Note (Signed)
Her oxygen saturation is normal Observe closely for now

## 2020-05-31 NOTE — Assessment & Plan Note (Signed)
Her pain is well controlled so far We discussed narcotic refill policy 

## 2020-06-01 ENCOUNTER — Other Ambulatory Visit: Payer: Self-pay | Admitting: Hematology and Oncology

## 2020-06-01 ENCOUNTER — Encounter (HOSPITAL_COMMUNITY): Payer: Self-pay

## 2020-06-01 ENCOUNTER — Other Ambulatory Visit: Payer: Self-pay

## 2020-06-01 ENCOUNTER — Ambulatory Visit (HOSPITAL_COMMUNITY)
Admission: RE | Admit: 2020-06-01 | Discharge: 2020-06-01 | Disposition: A | Discharge status: Home / Self Care | Payer: 59 | Source: Ambulatory Visit | Attending: Hematology and Oncology | Admitting: Hematology and Oncology

## 2020-06-01 DIAGNOSIS — Z79899 Other long term (current) drug therapy: Secondary | ICD-10-CM | POA: Diagnosis not present

## 2020-06-01 DIAGNOSIS — C541 Malignant neoplasm of endometrium: Secondary | ICD-10-CM | POA: Diagnosis not present

## 2020-06-01 HISTORY — PX: IR IMAGING GUIDED PORT INSERTION: IMG5740

## 2020-06-01 MED ORDER — MIDAZOLAM HCL 2 MG/2ML IJ SOLN
INTRAMUSCULAR | Status: AC
  Filled 2020-06-01: qty 4

## 2020-06-01 MED ORDER — CEFAZOLIN SODIUM-DEXTROSE 2-4 GM/100ML-% IV SOLN: 2.0000 g | Freq: Once | INTRAVENOUS | Status: AC

## 2020-06-01 MED ORDER — DIPHENHYDRAMINE HCL 50 MG/ML IJ SOLN
INTRAMUSCULAR | Status: AC | PRN
  Administered 2020-06-01: 25 mg via INTRAVENOUS

## 2020-06-01 MED ORDER — SODIUM CHLORIDE 0.9 % IV SOLN: INTRAVENOUS | Status: DC

## 2020-06-01 MED ORDER — HEPARIN SOD (PORK) LOCK FLUSH 100 UNIT/ML IV SOLN
INTRAVENOUS | Status: AC | PRN
  Administered 2020-06-01: 500 [IU] via INTRAVENOUS

## 2020-06-01 MED ORDER — CEFAZOLIN SODIUM-DEXTROSE 2-4 GM/100ML-% IV SOLN
INTRAVENOUS | Status: AC
  Administered 2020-06-01: 2 g via INTRAVENOUS
  Filled 2020-06-01: qty 100

## 2020-06-01 MED ORDER — MIDAZOLAM HCL 2 MG/2ML IJ SOLN
INTRAMUSCULAR | Status: AC | PRN
Start: 2020-06-01 — End: 2020-06-01
  Administered 2020-06-01 (×4): 1 mg via INTRAVENOUS

## 2020-06-01 MED ORDER — LIDOCAINE-EPINEPHRINE 1 %-1:100000 IJ SOLN
INTRAMUSCULAR | Status: AC | PRN
  Administered 2020-06-01 (×2): 10 mL via INTRADERMAL

## 2020-06-01 MED ORDER — HEPARIN SOD (PORK) LOCK FLUSH 100 UNIT/ML IV SOLN
INTRAVENOUS | Status: AC
  Filled 2020-06-01: qty 5

## 2020-06-01 MED ORDER — LIDOCAINE-EPINEPHRINE 1 %-1:100000 IJ SOLN
INTRAMUSCULAR | Status: AC
  Filled 2020-06-01: qty 1

## 2020-06-01 MED ORDER — DIPHENHYDRAMINE HCL 50 MG/ML IJ SOLN
INTRAMUSCULAR | Status: AC
  Filled 2020-06-01: qty 1

## 2020-06-01 MED ORDER — FENTANYL CITRATE (PF) 100 MCG/2ML IJ SOLN
INTRAMUSCULAR | Status: AC
  Filled 2020-06-01: qty 2

## 2020-06-01 MED ORDER — FENTANYL CITRATE (PF) 100 MCG/2ML IJ SOLN
INTRAMUSCULAR | Status: AC | PRN
  Administered 2020-06-01 (×2): 50 ug via INTRAVENOUS

## 2020-06-01 NOTE — Progress Notes (Signed)
Patient asked for a gingerale as she was getting dressed to go outside, gave patient a gingerale to take with her as she was getting into wheelchair. Her daughter is out front waiting on her.

## 2020-06-01 NOTE — Discharge Instructions (Signed)
Do not use any numbing cream on your port site for 2 weeks (14 days). It must heal first before using the numbing cream.  Remove the dressing in 24 hours, 06/02/20,Thurday at 12:00.  You do not have to place another dressing over the site.  Do not get the dressing wet, you may shower after the dressing is removed.    Implanted Port Insertion, Care After This sheet gives you information about how to care for yourself after your procedure. Your health care provider may also give you more specific instructions. If you have problems or questions, contact your health care provider. What can I expect after the procedure? After the procedure, it is common to have:  Discomfort at the port insertion site.  Bruising on the skin over the port. This should improve over 3-4 days. Follow these instructions at home: [facility] care  After your port is placed, you will get a manufacturer's information card. The card has information about your port. Keep this card with you at all times.  Take care of the port as told by your health care provider. Ask your health care provider if you or a family member can get training for taking care of the port at home. A home health care nurse may also take care of the port.  Make sure to remember what type of port you have. Incision care  Follow instructions from your health care provider about how to take care of your port insertion site. Make sure you: ? Wash your hands with soap and water before and after you change your bandage (dressing). If soap and water are not available, use hand sanitizer. ? Change your dressing as told by your health care provider. ? Leave stitches (sutures), skin glue, or adhesive strips in place. These skin closures may need to stay in place for 2 weeks or longer. If adhesive strip edges start to loosen and curl up, you may trim the loose edges. Do not remove adhesive strips completely unless your health care provider tells you to do that.  Check  your port insertion site every day for signs of infection. Check for: ? Redness, swelling, or pain. ? Fluid or blood. ? Warmth. ? Pus or a bad smell.      Activity  Return to your normal activities as told by your health care provider. Ask your health care provider what activities are safe for you.  Do not lift anything that is heavier than 10 lb (4.5 kg), or the limit that you are told, until your health care provider says that it is safe. General instructions  Take over-the-counter and prescription medicines only as told by your health care provider.  Do not take baths, swim, or use a hot tub until your health care provider approves. Ask your health care provider if you may take showers. You may only be allowed to take sponge baths.  Do not drive for 24 hours if you were given a sedative during your procedure.  Wear a medical alert bracelet in case of an emergency. This will tell any health care providers that you have a port.  Keep all follow-up visits as told by your health care provider. This is important. Contact a health care provider if:  You cannot flush your port with saline as directed, or you cannot draw blood from the port.  You have a fever or chills.  You have redness, swelling, or pain around your port insertion site.  You have fluid or blood coming from  your port insertion site.  Your port insertion site feels warm to the touch.  You have pus or a bad smell coming from the port insertion site. Get help right away if:  You have chest pain or shortness of breath.  You have bleeding from your port that you cannot control. Summary  Take care of the port as told by your health care provider. Keep the manufacturer's information card with you at all times.  Change your dressing as told by your health care provider.  Contact a health care provider if you have a fever or chills or if you have redness, swelling, or pain around your port insertion site.  Keep  all follow-up visits as told by your health care provider. This information is not intended to replace advice given to you by your health care provider. Make sure you discuss any questions you have with your health care provider. Document Revised: 11/19/2017 Document Reviewed: 11/19/2017 Elsevier Patient Education  2021 [place]. Moderate Conscious Sedation, Adult, Care After This sheet gives you information about how to care for yourself after your procedure. Your health care provider may also give you more specific instructions. If you have problems or questions, contact your health care provider. What can I expect after the procedure? After the procedure, it is common to have:  Sleepiness for several hours.  Impaired judgment for several hours.  Difficulty with balance.  Vomiting if you eat too soon. Follow these instructions at home: For the time period you were told by your health care provider:  Rest.  Do not participate in activities where you could fall or become injured.  Do not drive or use machinery.  Do not drink alcohol.  Do not take sleeping pills or medicines that cause drowsiness.  Do not make important decisions or sign legal documents.  Do not take care of children on your own.      Eating and drinking  Follow the diet recommended by your health care provider.  Drink enough fluid to keep your urine pale yellow.  If you vomit: ? Drink water, juice, or soup when you can drink without vomiting. ? Make sure you have little or no nausea before eating solid foods.   General instructions  Take over-the-counter and prescription medicines only as told by your health care provider.  Have a responsible adult stay with you for the time you are told. It is important to have someone help care for you until you are awake and alert.  Do not smoke.  Keep all follow-up visits as told by your health care provider. This is important. Contact a health care  provider if:  You are still sleepy or having trouble with balance after 24 hours.  You feel light-headed.  You keep feeling nauseous or you keep vomiting.  You develop a rash.  You have a fever.  You have redness or swelling around the IV site. Get help right away if:  You have trouble breathing.  You have new-onset confusion at home. Summary  After the procedure, it is common to feel sleepy, have impaired judgment, or feel nauseous if you eat too soon.  Rest after you get home. Know the things you should not do after the procedure.  Follow the diet recommended by your health care provider and drink enough fluid to keep your urine pale yellow.  Get help right away if you have trouble breathing or new-onset confusion at home. This information is not intended to replace advice given to  you by your health care provider. Make sure you discuss any questions you have with your health care provider. Document Revised: 08/21/2019 Document Reviewed: 03/19/2019 Elsevier Patient Education  2021 [employer].

## 2020-06-01 NOTE — Progress Notes (Signed)
Patient was offered a snack twice after returning from [employer] placement. Patient states she doesn't want anything.

## 2020-06-01 NOTE — Procedures (Signed)
Interventional Radiology Procedure Note  Procedure: RT IJ POWER PORT    Complications: None  Estimated Blood Loss:  MIN  Findings: TIP SVCRA    M. TREVOR Bre P[place], MD    

## 2020-06-02 ENCOUNTER — Telehealth: Payer: Self-pay | Admitting: Oncology

## 2020-06-02 ENCOUNTER — Ambulatory Visit: Payer: 59 | Admitting: Clinical

## 2020-06-02 ENCOUNTER — Ambulatory Visit (HOSPITAL_COMMUNITY)
Admission: RE | Admit: 2020-06-02 | Discharge: 2020-06-02 | Disposition: A | Discharge status: Home / Self Care | Payer: 59 | Source: Ambulatory Visit | Attending: Hematology and Oncology | Admitting: Hematology and Oncology

## 2020-06-02 DIAGNOSIS — C541 Malignant neoplasm of endometrium: Secondary | ICD-10-CM | POA: Diagnosis not present

## 2020-06-02 DIAGNOSIS — Z0189 Encounter for other specified special examinations: Secondary | ICD-10-CM | POA: Diagnosis not present

## 2020-06-02 DIAGNOSIS — C7800 Secondary malignant neoplasm of unspecified lung: Secondary | ICD-10-CM

## 2020-06-02 DIAGNOSIS — C778 Secondary and unspecified malignant neoplasm of lymph nodes of multiple regions: Secondary | ICD-10-CM

## 2020-06-02 DIAGNOSIS — E119 Type 2 diabetes mellitus without complications: Secondary | ICD-10-CM | POA: Diagnosis not present

## 2020-06-02 DIAGNOSIS — I10 Essential (primary) hypertension: Secondary | ICD-10-CM | POA: Diagnosis not present

## 2020-06-02 DIAGNOSIS — Z01810 Encounter for preprocedural cardiovascular examination: Secondary | ICD-10-CM | POA: Diagnosis present

## 2020-06-02 DIAGNOSIS — Z658 Other specified problems related to psychosocial circumstances: Secondary | ICD-10-CM

## 2020-06-02 LAB — ECHOCARDIOGRAM COMPLETE
Area-P 1/2: 2.84 cm2
S' Lateral: 1.6 cm

## 2020-06-02 NOTE — Patient Instructions (Signed)
Center for [facility] Healthcare at [facility] [place], [place] [address] [phone] (main office) [phone] ([place] office)  [place] and Websites Here are a few free apps meant to help you to help yourself.  To find, try searching on the internet to see if the app is offered on Apple/Android devices. If your first choice doesn't come up on your device, the good news is that there are many choices! Play around with different apps to see which ones are helpful to you.    Calm This is an app meant to help increase calm feelings. Includes info, strategies, and tools for tracking your feelings.      Calm Harm  This app is meant to help prevent self-harm. Provides many 5-minute or 15-min coping strategies  instead of hurting yourself.       [place] is a problem-solving tool to help deal with emotions and cope with stress you encounter wherever you are.      MindShift This app can help people cope with anxiety. Rather than trying to avoid anxiety, you can make an important shift and face it.      MY3  MY3 features a support system, safety plan and resources with the goal of offering a tool to use in a time of need.       My Life My Voice  This mood journal offers a simple solution for tracking your thoughts, feelings and moods. Animated emoticons can help identify your mood.       Relax Melodies Designed to help with sleep, on this app you can mix sounds and meditations for relaxation.      Smiling Mind Smiling Mind is meditation made easy: it's a simple tool that helps put a smile on your mind.        Stop, Breathe & Think  A friendly, simple guide for people through meditations for mindfulness and compassion.  Stop, Breathe and Think Kids Enter your current feelings and choose a "mission" to help you cope. Offers videos for certain moods instead of just sound recordings.       Team  Orange The goal of this tool is to help teens change how they think, act, and react. This app helps you focus on your own good feelings and experiences.      The [place] Box The [place] Box (VHB) contains simple tools to help patients with coping, relaxation, distraction, and positive thinking.

## 2020-06-02 NOTE — BH Specialist Note (Signed)
Integrated Behavioral Health via Telemedicine Visit  06/02/2020 Edison Wollschlager. [place] [id number]  Number of [place] visits: 1 Session Start time: 3:18  Session End time: 3:33 Total time: 15  Referring Provider: Guillermina [place], MD Patient/Family location: Home [facility] Provider location: Center for [facility] at [facility]  All persons participating in visit: Patient Lyly Canizales and [place]   Types of Service: Individual psychotherapy  I connected with Tia Masker. Escareno and/or Tia Masker. Biagini's 9yo grandson by Video (Video is Tree surgeon) and verified that I am speaking with the correct person using two identifiers.Discussed confidentiality: Yes   I discussed the limitations of telemedicine and the availability of in person appointments.  Discussed there is a possibility of technology failure and discussed alternative modes of communication if that failure occurs.  I discussed that engaging in this telemedicine visit, they consent to the provision of behavioral healthcare and the services will be billed under their insurance.  Patient and/or legal guardian expressed understanding and consented to Telemedicine visit: Yes   Presenting Concerns: Patient and/or family reports the following symptoms/concerns: Pt states her primary concerns are crying the past few days  and constipation. Pt is coping with recent life stress by sleeping well, focusing on small, healthy meals (soups, fruits, beans), listening to music and watching funny movies with her grandson. Pt has supportive family (husband, children, grandchildren).  Duration of problem: Over one month; Severity of problem: severe  Patient and/or Family's Strengths/Protective Factors: Social connections and Sense of purpose  Goals Addressed: Patient will: 1.  Reduce symptoms of: stress  2.  Increase knowledge and/or ability of: stress reduction  3.  Demonstrate  ability to: Increase healthy adjustment to current life circumstances  Progress towards Goals: Ongoing  Interventions: Interventions utilized:  Supportive Counseling Standardized Assessments completed: Not Needed  Patient and/or Family Response: Pt agrees with treatment plan  Assessment: Patient currently experiencing Psychosocial stress.   Patient may benefit from psychoeducation and brief therapeutic interventions regarding coping with adjusting to current life stress .  Plan: 1. Follow up with behavioral health clinician on : Call Lumen Brinlee at [phone] for follow up visit as needed 2. Behavioral recommendations:  -Continue to use self-coping strategies to manage emotions during this time (prioritizing healthy sleeping and eating,  time with family, music and humor daily) -Pt's grandson will help pick out funny movies to watch with his grandma this weekend -Set up MyChart today -Consider apps (on After Visit Summary) as distraction and self-care as needed  3. Referral(s): [place] (In Clinic)  I discussed the assessment and treatment plan with the patient and/or parent/guardian. They were provided an opportunity to ask questions and all were answered. They agreed with the plan and demonstrated an understanding of the instructions.   They were advised to call back or seek an in-person evaluation if the symptoms worsen or if the condition fails to improve as anticipated.  Caroleen Hamman Amair Shrout, LCSW

## 2020-06-02 NOTE — Telephone Encounter (Signed)
Called Amanda Lin and rescheduled appointment on 06/07/20 with Dr. Alvy Bimler to 1:45.  Also reminded her of the echo appointment today at 1:15 at [facility].  She verbalized understanding and agreement.

## 2020-06-02 NOTE — Progress Notes (Signed)
Echocardiogram 2D Echocardiogram has been performed.  Oneal Deputy Lurlene Ronda 06/02/2020, 1:58 PM

## 2020-06-03 ENCOUNTER — Other Ambulatory Visit: Payer: Self-pay

## 2020-06-03 ENCOUNTER — Other Ambulatory Visit: Payer: Self-pay | Admitting: Hematology and Oncology

## 2020-06-03 ENCOUNTER — Encounter: Payer: Self-pay | Admitting: Oncology

## 2020-06-03 ENCOUNTER — Telehealth: Payer: Self-pay

## 2020-06-03 ENCOUNTER — Inpatient Hospital Stay: Payer: 59

## 2020-06-03 VITALS — BP 178/94 | HR 93 | Temp 98.2°F | Resp 18 | Wt 264.0 lb

## 2020-06-03 DIAGNOSIS — C7800 Secondary malignant neoplasm of unspecified lung: Secondary | ICD-10-CM

## 2020-06-03 DIAGNOSIS — C541 Malignant neoplasm of endometrium: Secondary | ICD-10-CM

## 2020-06-03 DIAGNOSIS — K5909 Other constipation: Secondary | ICD-10-CM

## 2020-06-03 DIAGNOSIS — C778 Secondary and unspecified malignant neoplasm of lymph nodes of multiple regions: Secondary | ICD-10-CM

## 2020-06-03 MED ORDER — SODIUM CHLORIDE 0.9 % IV SOLN
131.2500 mg/m2 | Freq: Once | INTRAVENOUS | Status: AC
  Administered 2020-06-03: 294 mg via INTRAVENOUS
  Filled 2020-06-03: qty 49

## 2020-06-03 MED ORDER — SODIUM CHLORIDE 0.9 % IV SOLN
Freq: Once | INTRAVENOUS | Status: AC
  Filled 2020-06-03: qty 250

## 2020-06-03 MED ORDER — SODIUM CHLORIDE 0.9% FLUSH
10.0000 mL | INTRAVENOUS | Status: DC | PRN
  Administered 2020-06-03: 10 mL
  Filled 2020-06-03: qty 10

## 2020-06-03 MED ORDER — SODIUM CHLORIDE 0.9 % IV SOLN
150.0000 mg | Freq: Once | INTRAVENOUS | Status: AC
  Administered 2020-06-03: 150 mg via INTRAVENOUS
  Filled 2020-06-03: qty 150

## 2020-06-03 MED ORDER — DIPHENHYDRAMINE HCL 50 MG/ML IJ SOLN
25.0000 mg | Freq: Once | INTRAMUSCULAR | Status: AC
  Administered 2020-06-03: 25 mg via INTRAVENOUS

## 2020-06-03 MED ORDER — ACETAMINOPHEN 325 MG PO TABS
650.0000 mg | ORAL_TABLET | Freq: Once | ORAL | Status: AC
  Administered 2020-06-03: 650 mg via ORAL

## 2020-06-03 MED ORDER — FAMOTIDINE IN NACL 20-0.9 MG/50ML-% IV SOLN
20.0000 mg | Freq: Once | INTRAVENOUS | Status: AC
  Administered 2020-06-03: 20 mg via INTRAVENOUS

## 2020-06-03 MED ORDER — ACETAMINOPHEN 325 MG PO TABS
ORAL_TABLET | ORAL | Status: AC
  Filled 2020-06-03: qty 2

## 2020-06-03 MED ORDER — PALONOSETRON HCL INJECTION 0.25 MG/5ML
INTRAVENOUS | Status: AC
  Filled 2020-06-03: qty 5

## 2020-06-03 MED ORDER — FAMOTIDINE IN NACL 20-0.9 MG/50ML-% IV SOLN
INTRAVENOUS | Status: AC
  Filled 2020-06-03: qty 50

## 2020-06-03 MED ORDER — SODIUM CHLORIDE 0.9 % IV SOLN
10.0000 mg | Freq: Once | INTRAVENOUS | Status: AC
  Administered 2020-06-03: 10 mg via INTRAVENOUS
  Filled 2020-06-03: qty 10

## 2020-06-03 MED ORDER — TRASTUZUMAB-DKST CHEMO 150 MG IV SOLR
8.0000 mg/kg | Freq: Once | INTRAVENOUS | Status: AC
  Administered 2020-06-03: 945 mg via INTRAVENOUS
  Filled 2020-06-03: qty 45

## 2020-06-03 MED ORDER — PALONOSETRON HCL INJECTION 0.25 MG/5ML
0.2500 mg | Freq: Once | INTRAVENOUS | Status: AC
  Administered 2020-06-03: 0.25 mg via INTRAVENOUS

## 2020-06-03 MED ORDER — LACTULOSE 10 GM/15ML PO SOLN: 20.0000 g | Freq: Three times a day (TID) | ORAL | 1 refills | Status: DC

## 2020-06-03 MED ORDER — HEPARIN SOD (PORK) LOCK FLUSH 100 UNIT/ML IV SOLN
500.0000 [IU] | Freq: Once | INTRAVENOUS | Status: AC | PRN
  Administered 2020-06-03: 500 [IU]
  Filled 2020-06-03: qty 5

## 2020-06-03 MED ORDER — SODIUM CHLORIDE 0.9 % IV SOLN
666.5000 mg | Freq: Once | INTRAVENOUS | Status: AC
  Administered 2020-06-03: 670 mg via INTRAVENOUS
  Filled 2020-06-03: qty 67

## 2020-06-03 MED ORDER — DIPHENHYDRAMINE HCL 50 MG/ML IJ SOLN
INTRAMUSCULAR | Status: AC
  Filled 2020-06-03: qty 1

## 2020-06-03 NOTE — Progress Notes (Signed)
Dr Francis Gaines states okay to treat with BP 193/83 HR 108 and constipation

## 2020-06-03 NOTE — Progress Notes (Signed)
Met with Amanda Lin during her first infusion.  Advised her of lactulose prescription sent to [facility] and  that she should start it this evening and continue her other laxatives.  Also provided her with a calender of upcoming appointments.  Encouraged her to call with an questions or needs.

## 2020-06-03 NOTE — Progress Notes (Signed)
During Taxol infusion at [facility], patient began to complain of nausea. Patient stated that she has not eaten much today, patient was given a sandwich and gingerale and vitals were taken. Vitals WDL and patient reported she felt better after she ate.

## 2020-06-03 NOTE — Telephone Encounter (Signed)
Attempted to call. Mailbox full and unable to leave a message. Lactulose Rx sent to pharmacy for constipation start today TID. Continue Miralax and Senokot as before with Lactulose Rx.  Will call back.

## 2020-06-03 NOTE — Patient Instructions (Addendum)
[facility] Discharge Instructions for Patients Receiving Chemotherapy  Today you received the following chemotherapy agents Traztuzumab, Paclitaxel, Carboplatin  To help prevent nausea and vomiting after your treatment, we encourage you to take your nausea medication as directed   If you develop nausea and vomiting that is not controlled by your nausea medication, call the clinic.   BELOW ARE SYMPTOMS THAT SHOULD BE REPORTED IMMEDIATELY:  *FEVER GREATER THAN 100.5 F  *CHILLS WITH OR WITHOUT FEVER  NAUSEA AND VOMITING THAT IS NOT CONTROLLED WITH YOUR NAUSEA MEDICATION  *UNUSUAL SHORTNESS OF BREATH  *UNUSUAL BRUISING OR BLEEDING  TENDERNESS IN MOUTH AND THROAT WITH OR WITHOUT PRESENCE OF ULCERS  *URINARY PROBLEMS  *BOWEL PROBLEMS  UNUSUAL RASH Items with * indicate a potential emergency and should be followed up as soon as possible.  Feel free to call the clinic should you have any questions or concerns. The clinic phone number is [phone].  Please show the [facility] at check-in to the Emergency Department and triage nurse.  Trastuzumab injection for infusion What is this medicine? TRASTUZUMAB (tras TOO zoo mab) is a monoclonal antibody. It is used to treat breast cancer and stomach cancer. This medicine may be used for other purposes; ask your health care provider or pharmacist if you have questions. COMMON BRAND NAME(S): Herceptin, Galvin Proffer, Trazimera What should I tell my health care provider before I take this medicine? They need to know if you have any of these conditions:  heart disease  heart failure  lung or breathing disease, like asthma  an unusual or allergic reaction to trastuzumab, benzyl alcohol, or other medications, foods, dyes, or preservatives  pregnant or trying to get pregnant  breast-feeding How should I use this medicine? This drug is given as an infusion into a vein. It is administered  in a hospital or clinic by a specially trained health care professional. Talk to your pediatrician regarding the use of this medicine in children. This medicine is not approved for use in children. Overdosage: If you think you have taken too much of this medicine contact a poison control center or emergency room at once. NOTE: This medicine is only for you. Do not share this medicine with others. What if I miss a dose? It is important not to miss a dose. Call your doctor or health care professional if you are unable to keep an appointment. What may interact with this medicine? This medicine may interact with the following medications:  certain types of chemotherapy, such as daunorubicin, doxorubicin, epirubicin, and idarubicin This list may not describe all possible interactions. Give your health care provider a list of all the medicines, herbs, non-prescription drugs, or dietary supplements you use. Also tell them if you smoke, drink alcohol, or use illegal drugs. Some items may interact with your medicine. What should I watch for while using this medicine? Visit your doctor for checks on your progress. Report any side effects. Continue your course of treatment even though you feel ill unless your doctor tells you to stop. Call your doctor or health care professional for advice if you get a fever, chills or sore throat, or other symptoms of a cold or flu. Do not treat yourself. Try to avoid being around people who are sick. You may experience fever, chills and shaking during your first infusion. These effects are usually mild and can be treated with other medicines. Report any side effects during the infusion to your health care professional. Fever  and chills usually do not happen with later infusions. Do not become pregnant while taking this medicine or for 7 months after stopping it. Women should inform their doctor if they wish to become pregnant or think they might be pregnant. Women of  child-bearing potential will need to have a negative pregnancy test before starting this medicine. There is a potential for serious side effects to an unborn child. Talk to your health care professional or pharmacist for more information. Do not breast-feed an infant while taking this medicine or for 7 months after stopping it. Women must use effective birth control with this medicine. What side effects may I notice from receiving this medicine? Side effects that you should report to your doctor or health care professional as soon as possible:  allergic reactions like skin rash, itching or hives, swelling of the face, lips, or tongue  chest pain or palpitations  cough  dizziness  feeling faint or lightheaded, falls  fever  general ill feeling or flu-like symptoms  signs of worsening heart failure like breathing problems; swelling in your legs and feet  unusually weak or tired Side effects that usually do not require medical attention (report to your doctor or health care professional if they continue or are bothersome):  bone pain  changes in taste  diarrhea  joint pain  nausea/vomiting  weight loss This list may not describe all possible side effects. Call your doctor for medical advice about side effects. You may report side effects to FDA at 1-800-FDA-1088. Where should I keep my medicine? This drug is given in a hospital or clinic and will not be stored at home. NOTE: This sheet is a summary. It may not cover all possible information. If you have questions about this medicine, talk to your doctor, pharmacist, or health care provider.  2021 Elsevier/Gold Standard (2016-04-17 14:37:52)  Paclitaxel injection What is this medicine? PACLITAXEL (PAK li TAX el) is a chemotherapy drug. It targets fast dividing cells, like cancer cells, and causes these cells to die. This medicine is used to treat ovarian cancer, breast cancer, lung cancer, Kaposi's sarcoma, and other  cancers. This medicine may be used for other purposes; ask your health care provider or pharmacist if you have questions. COMMON BRAND NAME(S): Onxol, Taxol What should I tell my health care provider before I take this medicine? They need to know if you have any of these conditions:  history of irregular heartbeat  liver disease  low blood counts, like low white cell, platelet, or red cell counts  lung or breathing disease, like asthma  tingling of the fingers or toes, or other nerve disorder  an unusual or allergic reaction to paclitaxel, alcohol, polyoxyethylated castor oil, other chemotherapy, other medicines, foods, dyes, or preservatives  pregnant or trying to get pregnant  breast-feeding How should I use this medicine? This drug is given as an infusion into a vein. It is administered in a hospital or clinic by a specially trained health care professional. Talk to your pediatrician regarding the use of this medicine in children. Special care may be needed. Overdosage: If you think you have taken too much of this medicine contact a poison control center or emergency room at once. NOTE: This medicine is only for you. Do not share this medicine with others. What if I miss a dose? It is important not to miss your dose. Call your doctor or health care professional if you are unable to keep an appointment. What may interact with this medicine?  Do not take this medicine with any of the following medications:  live virus vaccines This medicine may also interact with the following medications:  antiviral medicines for hepatitis, HIV or AIDS  certain antibiotics like erythromycin and clarithromycin  certain medicines for fungal infections like ketoconazole and itraconazole  certain medicines for seizures like carbamazepine, phenobarbital, phenytoin  gemfibrozil  nefazodone  rifampin  St. John's wort This list may not describe all possible interactions. Give your health  care provider a list of all the medicines, herbs, non-prescription drugs, or dietary supplements you use. Also tell them if you smoke, drink alcohol, or use illegal drugs. Some items may interact with your medicine. What should I watch for while using this medicine? Your condition will be monitored carefully while you are receiving this medicine. You will need important blood work done while you are taking this medicine. This medicine can cause serious allergic reactions. To reduce your risk you will need to take other medicine(s) before treatment with this medicine. If you experience allergic reactions like skin rash, itching or hives, swelling of the face, lips, or tongue, tell your doctor or health care professional right away. In some cases, you may be given additional medicines to help with side effects. Follow all directions for their use. This drug may make you feel generally unwell. This is not uncommon, as chemotherapy can affect healthy cells as well as cancer cells. Report any side effects. Continue your course of treatment even though you feel ill unless your doctor tells you to stop. Call your doctor or health care professional for advice if you get a fever, chills or sore throat, or other symptoms of a cold or flu. Do not treat yourself. This drug decreases your body's ability to fight infections. Try to avoid being around people who are sick. This medicine may increase your risk to bruise or bleed. Call your doctor or health care professional if you notice any unusual bleeding. Be careful brushing and flossing your teeth or using a toothpick because you may get an infection or bleed more easily. If you have any dental work done, tell your dentist you are receiving this medicine. Avoid taking products that contain aspirin, acetaminophen, ibuprofen, naproxen, or ketoprofen unless instructed by your doctor. These medicines may hide a fever. Do not become pregnant while taking this medicine.  Women should inform their doctor if they wish to become pregnant or think they might be pregnant. There is a potential for serious side effects to an unborn child. Talk to your health care professional or pharmacist for more information. Do not breast-feed an infant while taking this medicine. Men are advised not to father a child while receiving this medicine. This product may contain alcohol. Ask your pharmacist or healthcare provider if this medicine contains alcohol. Be sure to tell all healthcare providers you are taking this medicine. Certain medicines, like metronidazole and disulfiram, can cause an unpleasant reaction when taken with alcohol. The reaction includes flushing, headache, nausea, vomiting, sweating, and increased thirst. The reaction can last from 30 minutes to several hours. What side effects may I notice from receiving this medicine? Side effects that you should report to your doctor or health care professional as soon as possible:  allergic reactions like skin rash, itching or hives, swelling of the face, lips, or tongue  breathing problems  changes in vision  fast, irregular heartbeat  high or low blood pressure  mouth sores  pain, tingling, numbness in the hands or feet  signs  of decreased platelets or bleeding - bruising, pinpoint red spots on the skin, black, tarry stools, blood in the urine  signs of decreased red blood cells - unusually weak or tired, feeling faint or lightheaded, falls  signs of infection - fever or chills, cough, sore throat, pain or difficulty passing urine  signs and symptoms of liver injury like dark yellow or brown urine; general ill feeling or flu-like symptoms; light-colored stools; loss of appetite; nausea; right upper belly pain; unusually weak or tired; yellowing of the eyes or skin  swelling of the ankles, feet, hands  unusually slow heartbeat Side effects that usually do not require medical attention (report to your doctor or  health care professional if they continue or are bothersome):  diarrhea  hair loss  loss of appetite  muscle or joint pain  nausea, vomiting  pain, redness, or irritation at site where injected  tiredness This list may not describe all possible side effects. Call your doctor for medical advice about side effects. You may report side effects to FDA at 1-800-FDA-1088. Where should I keep my medicine? This drug is given in a hospital or clinic and will not be stored at home. NOTE: This sheet is a summary. It may not cover all possible information. If you have questions about this medicine, talk to your doctor, pharmacist, or health care provider.  2021 Elsevier/Gold Standard (2019-03-25 13:37:23)  Carboplatin injection What is this medicine? CARBOPLATIN (KAR boe pla tin) is a chemotherapy drug. It targets fast dividing cells, like cancer cells, and causes these cells to die. This medicine is used to treat ovarian cancer and many other cancers. This medicine may be used for other purposes; ask your health care provider or pharmacist if you have questions. COMMON BRAND NAME(S): Paraplatin What should I tell my health care provider before I take this medicine? They need to know if you have any of these conditions:  blood disorders  hearing problems  kidney disease  recent or ongoing radiation therapy  an unusual or allergic reaction to carboplatin, cisplatin, other chemotherapy, other medicines, foods, dyes, or preservatives  pregnant or trying to get pregnant  breast-feeding How should I use this medicine? This drug is usually given as an infusion into a vein. It is administered in a hospital or clinic by a specially trained health care professional. Talk to your pediatrician regarding the use of this medicine in children. Special care may be needed. Overdosage: If you think you have taken too much of this medicine contact a poison control center or emergency room at  once. NOTE: This medicine is only for you. Do not share this medicine with others. What if I miss a dose? It is important not to miss a dose. Call your doctor or health care professional if you are unable to keep an appointment. What may interact with this medicine?  medicines for seizures  medicines to increase blood counts like filgrastim, pegfilgrastim, sargramostim  some antibiotics like amikacin, gentamicin, neomycin, streptomycin, tobramycin  vaccines Talk to your doctor or health care professional before taking any of these medicines:  acetaminophen  aspirin  ibuprofen  ketoprofen  naproxen This list may not describe all possible interactions. Give your health care provider a list of all the medicines, herbs, non-prescription drugs, or dietary supplements you use. Also tell them if you smoke, drink alcohol, or use illegal drugs. Some items may interact with your medicine. What should I watch for while using this medicine? Your condition will be monitored carefully  while you are receiving this medicine. You will need important blood work done while you are taking this medicine. This drug may make you feel generally unwell. This is not uncommon, as chemotherapy can affect healthy cells as well as cancer cells. Report any side effects. Continue your course of treatment even though you feel ill unless your doctor tells you to stop. In some cases, you may be given additional medicines to help with side effects. Follow all directions for their use. Call your doctor or health care professional for advice if you get a fever, chills or sore throat, or other symptoms of a cold or flu. Do not treat yourself. This drug decreases your body's ability to fight infections. Try to avoid being around people who are sick. This medicine may increase your risk to bruise or bleed. Call your doctor or health care professional if you notice any unusual bleeding. Be careful brushing and flossing your  teeth or using a toothpick because you may get an infection or bleed more easily. If you have any dental work done, tell your dentist you are receiving this medicine. Avoid taking products that contain aspirin, acetaminophen, ibuprofen, naproxen, or ketoprofen unless instructed by your doctor. These medicines may hide a fever. Do not become pregnant while taking this medicine. Women should inform their doctor if they wish to become pregnant or think they might be pregnant. There is a potential for serious side effects to an unborn child. Talk to your health care professional or pharmacist for more information. Do not breast-feed an infant while taking this medicine. What side effects may I notice from receiving this medicine? Side effects that you should report to your doctor or health care professional as soon as possible:  allergic reactions like skin rash, itching or hives, swelling of the face, lips, or tongue  signs of infection - fever or chills, cough, sore throat, pain or difficulty passing urine  signs of decreased platelets or bleeding - bruising, pinpoint red spots on the skin, black, tarry stools, nosebleeds  signs of decreased red blood cells - unusually weak or tired, fainting spells, lightheadedness  breathing problems  changes in hearing  changes in vision  chest pain  high blood pressure  low blood counts - This drug may decrease the number of white blood cells, red blood cells and platelets. You may be at increased risk for infections and bleeding.  nausea and vomiting  pain, swelling, redness or irritation at the injection site  pain, tingling, numbness in the hands or feet  problems with balance, talking, walking  trouble passing urine or change in the amount of urine Side effects that usually do not require medical attention (report to your doctor or health care professional if they continue or are bothersome):  hair loss  loss of appetite  metallic taste  in the mouth or changes in taste This list may not describe all possible side effects. Call your doctor for medical advice about side effects. You may report side effects to FDA at 1-800-FDA-1088. Where should I keep my medicine? This drug is given in a hospital or clinic and will not be stored at home. NOTE: This sheet is a summary. It may not cover all possible information. If you have questions about this medicine, talk to your doctor, pharmacist, or health care provider.  2021 Elsevier/Gold Standard (2007-07-29 14:38:05)

## 2020-06-03 NOTE — Telephone Encounter (Signed)
Called daughter back and given below message to start Lactulose today and continue Miralax/ senokot. She verbalized understanding.

## 2020-06-07 ENCOUNTER — Other Ambulatory Visit: Payer: Self-pay | Admitting: Hematology and Oncology

## 2020-06-07 ENCOUNTER — Encounter: Payer: Self-pay | Admitting: Hematology and Oncology

## 2020-06-07 ENCOUNTER — Telehealth (HOSPITAL_BASED_OUTPATIENT_CLINIC_OR_DEPARTMENT_OTHER): Payer: 59 | Admitting: Hematology and Oncology

## 2020-06-07 ENCOUNTER — Other Ambulatory Visit: Payer: Self-pay

## 2020-06-07 ENCOUNTER — Telehealth: Payer: Self-pay | Admitting: Hematology and Oncology

## 2020-06-07 ENCOUNTER — Inpatient Hospital Stay: Payer: 59 | Admitting: Hematology and Oncology

## 2020-06-07 DIAGNOSIS — G893 Neoplasm related pain (acute) (chronic): Secondary | ICD-10-CM

## 2020-06-07 DIAGNOSIS — K5909 Other constipation: Secondary | ICD-10-CM

## 2020-06-07 DIAGNOSIS — R112 Nausea with vomiting, unspecified: Secondary | ICD-10-CM | POA: Diagnosis not present

## 2020-06-07 DIAGNOSIS — C541 Malignant neoplasm of endometrium: Secondary | ICD-10-CM

## 2020-06-07 MED ORDER — OXYCODONE HCL 5 MG PO TABS: 5.0000 mg | ORAL_TABLET | ORAL | 0 refills | Status: DC | PRN

## 2020-06-07 NOTE — Telephone Encounter (Signed)
Scheduled follow-up appointment per 2/1 schedule message. Patient is aware. 

## 2020-06-07 NOTE — Assessment & Plan Note (Signed)
Her pain control has improved I refill her prescription of oxycodone We discussed narcotic refill policy

## 2020-06-07 NOTE — Assessment & Plan Note (Signed)
She will continue aggressive laxative therapy I have requested the patient to call if she runs into problems with constipation again

## 2020-06-07 NOTE — Assessment & Plan Note (Signed)
Her symptoms of nausea and vomiting is likely related to side effects of treatment and her disease She will continue to use antiemetics

## 2020-06-07 NOTE — Assessment & Plan Note (Signed)
She continues to have persistent abdominal pain, nausea and intermittent constipation We will continue aggressive supportive care I will get my nursing staff to check on her on Friday when she show up for booster vaccine I will see her prior to cycle 2 of treatment

## 2020-06-07 NOTE — Progress Notes (Signed)
HEMATOLOGY-ONCOLOGY ELECTRONIC VISIT PROGRESS NOTE  Patient Care Team: Nicolette Bang, DO as PCP - General (Family Medicine) [name] [name], RN as Oncology Nurse Navigator (Oncology)  I connected with  the patient over the telephone for symptom management  ASSESSMENT & PLAN:  Endometrial carcinoma [facility]) She continues to have persistent abdominal pain, nausea and intermittent constipation We will continue aggressive supportive care I will get my nursing staff to check on her on Friday when she show up for booster vaccine I will see her prior to cycle 2 of treatment  Cancer associated pain Her pain control has improved I refill her prescription of oxycodone We discussed narcotic refill policy  Other constipation She will continue aggressive laxative therapy I have requested the patient to call if she runs into problems with constipation again  Nausea with vomiting Her symptoms of nausea and vomiting is likely related to side effects of treatment and her disease She will continue to use antiemetics   No orders of the defined types were placed in this encounter.   INTERVAL HISTORY: Please see below for problem oriented charting. 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     02/26/2020 Imaging   1. 3 mm nonobstructing renal stone within the left kidney. 2. Findings suggestive of a small hemorrhagic cyst within the lower pole of  the right kidney, with an anterior right lower pole heterogeneous renal soft tissue mass. MRI correlation is recommended, as an underlying neoplastic process cannot be excluded. 3. Colonic diverticulosis. 4. Multiple exophytic uterine fibroids. 5. Evidence of prior cholecystectomy.   03/28/2020 Imaging   2.4 cm enhancing mass in the anterior right lower kidney, suspicious for solid renal neoplasm such as renal cell carcinoma.   Single right renal artery and vein.  No renal vein invasion.   Small retroperitoneal nodes, measuring up to 9 mm short axis, mildly prominent but technically within the upper limits of normal. Attention on follow-up is suggested.   4 x 5 mm nodule in the medial right lower lobe. This is nonspecific and unlikely to reflect a metastasis but warrants attention on follow-up. Consider dedicated CT chest in 3-6 months.   3 mm nonobstructing left lower pole renal calculus. No hydronephrosis.     04/04/2020 Imaging   1. 10 mm endometrial stripe thickness. This is considered abnormal in a postmenopausal female with bleeding. In the setting of post-menopausal bleeding, endometrial sampling is indicated to exclude carcinoma.  2. Multiple uterine fibroids including potential submucosal fibroid in the lower uterine segment.   04/19/2020 Pathology Results   A. ENDOMETRIUM, BIOPSY:  -  High-grade carcinoma  Immunohistochemical and morphometric analysis performed manually   The tumor cells are POSITIVE for Her2 (3+).    04/19/2020 Procedure   She was seen by Dr. Astrid Drafts on 04/19/2020.  At that visit her cervix was noted to be nodular and firm and abnormal appearing.  Pap taken at that visit was positive for adenocarcinoma, endometrial.  An endometrial Pipelle biopsy was  taken at the same visit which revealed high-grade endometrial cancer, suspicious for serous carcinoma.     05/12/2020 Cancer Staging   Staging form: Corpus Uteri - Carcinoma and Carcinosarcoma, AJCC 8th  Edition - Clinical stage from 05/12/2020: FIGO Stage IVB (cT3, cN2, cM1) - Signed by [name], MD on 05/31/2020   05/30/2020 PET scan   1. Hypermetabolic lymph nodes identified in the left supraclavicular region/thoracic inlet, mediastinum, hilar regions, right retrocrural space, para-aortic retroperitoneum, and bilateral pelvic sidewall, compatible with metastatic disease. 2. Moderate volume ascites on this noncontrast CT study largely obscures peritoneal and omental disease although hypermetabolic plaque-like uptake in the anterior high abdomen is probably peritoneal with relatively bulky hypermetabolism in the omentum. These findings are also compatible with metastatic disease. 3. Hypermetabolic left upper lobe pulmonary nodule consistent with metastatic disease. Lung primary is considered less likely but not excluded. 4. Scattered areas of focal marrow uptake are suspicious for bony metastases although no underlying abnormality is evident on CT imaging. 5. Diffuse uptake in the uterine anatomy compatible with the patient's known history of endometrial carcinoma. 6. As above, moderate volume ascites is new in the interval. 7. Enhancing lesion lower pole right kidney seen on previous CT of 03/28/2020 is less conspicuous on today's noncontrast CT imaging. Diffuse normal background FDG accumulation could obscure hypermetabolism in this lesion previously characterized as suspicious for renal cell carcinoma. 8. Bibasilar collapse/consolidation in the lower lobes.   06/01/2020 Procedure   Ultrasound and fluoroscopically guided right internal jugular single lumen power port catheter insertion. Tip in the SVC/RA junction. Catheter ready for use.   06/02/2020 Echocardiogram    1. GLS -18.2%.  2. Left ventricular ejection fraction, by estimation, is 60 to 65%. The left ventricle has normal function. The left ventricle has no regional wall motion abnormalities. There is mild left ventricular hypertrophy. Left  ventricular diastolic parameters are consistent with Grade I diastolic dysfunction (impaired relaxation).  3. Right ventricular systolic function is normal. The right ventricular size is normal.  4. The mitral valve is normal in structure. No evidence of mitral valve regurgitation. No evidence of mitral stenosis.  5. The aortic valve is tricuspid. Aortic valve regurgitation is not visualized. No aortic stenosis is present.  6. The inferior vena cava is normal in size with greater than 50% respiratory variability, suggesting right atrial pressure of 3 mmHg.     06/03/2020 -  Chemotherapy    Patient is on Treatment Plan: UTERINE SEROUS CARCINOMA CARBOPLATIN + PACLITAXEL + TRASTUZUMAB Q21D X 6 CYCLES / TRASTUZUMAB Q21D      Metastasis to lymph nodes ([place])  05/31/2020 Initial Diagnosis   Metastasis to lymph nodes ([place])   06/03/2020 -  Chemotherapy    Patient is on Treatment Plan: UTERINE SEROUS CARCINOMA CARBOPLATIN + PACLITAXEL + TRASTUZUMAB Q21D X 6 CYCLES / TRASTUZUMAB Q21D      Metastasis to lung ([place])  05/31/2020 Initial Diagnosis   Metastasis to lung ([place])   06/03/2020 -  Chemotherapy    Patient is on Treatment Plan: UTERINE SEROUS CARCINOMA CARBOPLATIN + PACLITAXEL + TRASTUZUMAB Q21D X 6 CYCLES / TRASTUZUMAB Q21D        REVIEW OF SYSTEMS:   Constitutional: Denies fevers, chills or abnormal weight loss Eyes: Denies blurriness of vision Ears, nose, mouth, throat, and face: Denies mucositis or sore throat Respiratory: Denies cough, dyspnea or wheezes Cardiovascular: Denies palpitation, chest discomfort Skin: Denies abnormal skin rashes Lymphatics: Denies new lymphadenopathy or easy bruising Neurological:Denies numbness, tingling or new weaknesses Behavioral/Psych:  Mood is stable, no new changes  Extremities: No lower extremity edema All other systems were reviewed with the patient and are negative.  I have reviewed the past medical history, past surgical history, social  history and family history with the patient and they are unchanged from previous note.  ALLERGIES:  has No Known Allergies.  MEDICATIONS:  Current Outpatient Medications  Medication Sig Dispense Refill  . dexamethasone (DECADRON) 4 MG tablet Take 2 tabs at the night before and 2 tab the morning of chemotherapy, every 3 weeks, by mouth x 6 cycles 36 tablet 6  . lactulose (CHRONULAC) 10 GM/15ML solution Take 30 mLs (20 g total) by mouth 3 (three) times daily. 946 mL 1  . lidocaine-prilocaine (EMLA) cream Apply to affected area once 30 g 3  . losartan (COZAAR) 50 MG tablet Take 1 tablet (50 mg total) by mouth daily. 90 tablet 1  . ondansetron (ZOFRAN) 8 MG tablet Take 1 tablet (8 mg total) by mouth every 8 (eight) hours as needed for refractory nausea / vomiting. Start on day 3 after carboplatin chemo. 30 tablet 1  . oxyCODONE (OXY IR/ROXICODONE) 5 MG immediate release tablet Take 1 tablet (5 mg total) by mouth every 4 (four) hours as needed for severe pain. 60 tablet 0  . polyethylene glycol (MIRALAX / GLYCOLAX) 17 g packet Take 17 g by mouth daily.    . prochlorperazine (COMPAZINE) 10 MG tablet Take 1 tablet (10 mg total) by mouth every 6 (six) hours as needed (Nausea or vomiting). 30 tablet 1   No current facility-administered medications for this visit.    PHYSICAL EXAMINATION: ECOG PERFORMANCE STATUS: 1 - Symptomatic but completely ambulatory  LABORATORY DATA:  I have reviewed the data as listed CMP Latest Ref Rng & Units 05/31/2020 05/11/2020 04/04/2020  Glucose 70 - 99 mg/dL 152(H) 108(H) 134(H)  BUN 8 - 23 mg/dL 13 12 10   Creatinine 0.44 - 1.00 mg/dL 0.99 0.89 0.77  Sodium 135 - 145 mmol/L 139 144 140  Potassium 3.5 - 5.1 mmol/L 4.2 3.9 3.3(L)  Chloride 98 - 111 mmol/L 101 109 105  CO2 22 - 32 mmol/L 25 23 22   Calcium 8.9 - 10.3 mg/dL 9.2 9.5 9.5  Total Protein 6.5 - 8.1 g/dL 7.5 7.4 7.2  Total Bilirubin 0.3 - 1.2 mg/dL 0.4 0.5 0.7  Alkaline Phos 38 - 126 U/L 79 70 70  AST 15 -  41 U/L 10(L) 13(L) 13(L)  ALT 0 - 44 U/L 7 11 13     Lab Results  Component Value Date   WBC 12.4 (H) 05/31/2020   HGB 11.7 (L) 05/31/2020   HCT 37.2 05/31/2020   MCV 72.8 (L) 05/31/2020   PLT 796 (H) 05/31/2020   NEUTROABS 10.4 (H) 05/31/2020     RADIOGRAPHIC STUDIES: I have personally reviewed the radiological images as listed and agreed with the findings in the report. NM PET Image Initial (PI) Skull Base To Thigh  Result Date: 05/30/2020 CLINICAL DATA:  Initial treatment strategy for endometrial cancer. Also history of enhancing renal mass lower pole right kidney. EXAM: NUCLEAR MEDICINE PET SKULL BASE TO THIGH TECHNIQUE: 13.1 mCi F-18 FDG was injected intravenously. Full-ring PET imaging was performed from the skull base to thigh after the radiotracer. CT data was obtained and used for attenuation correction and anatomic localization. Fasting blood glucose: 167 mg/dl COMPARISON:  Abdomen/pelvis CT 03/28/2020. FINDINGS: Mediastinal blood pool activity: SUV max 2.8 Liver activity: SUV max NA NECK: Hypermetabolic supraclavicular lymphadenopathy identified on the  left. Incidental CT findings: None. CHEST: 10 mm short axis node towards the thoracic inlet (49/4) demonstrates SUV max = 7.6. Hypermetabolic lymph nodes are seen in the anterior mediastinum. 8 mm lymph node between the esophagus in the aorta on image 70 of series 4 demonstrates SUV max = 6.6. Hypermetabolic lymph nodes are seen in the AP window and axillary regions. 10 mm right retrocrural node on 98/4 demonstrates SUV max = 6.0. 12 mm left upper lobe pulmonary nodule on 75/4 demonstrates SUV max = 4.5. Incidental CT findings: Bilateral collapse/consolidative opacity seen in the lung bases. Tiny bilateral pleural effusions noted. ABDOMEN/PELVIS: 2 cm plaque-like peritoneal lesion anterior to the left liver demonstrates SUV max = 7.3 Omental disease in the right paramidline abdomen is not well demonstrated on noncontrast CT imaging with  SUV max = 10.9 (CT image 136/4). Midline omental disease in the anterior pelvis is not well demonstrated on noncontrast CT imaging with SUV max = 14. 15 mm left para-aortic node on 128/7 is hypermetabolic. 13 mm left pelvic sidewall lymph node on 867/6 is hypermetabolic with SUV max = 8.9. Diffuse hypermetabolism noted in the uterus with SUV max = of 13.1. Incidental CT findings: Moderate volume ascites is new in the interval. Gallbladder surgically absent. 13 mm hypoattenuating lesion posterior lower pole right kidney is stable. Lesion of concern in the anterior lower pole right kidney is obscured by background renal parenchymal uptake. SKELETON: Scattered areas of hypermetabolism in the bony anatomy are suspicious for marrow involvement. Index uptake in the right aspect of the S1 vertebral body demonstrates SUV max = 5.0 with no underlying osseous abnormality by CT imaging. Focal uptake in the posterior left iliac crest demonstrates SUV max = 5.1 with no discrete lesion by CT. Incidental CT findings: No worrisome lytic or sclerotic osseous abnormality. IMPRESSION: 1. Hypermetabolic lymph nodes identified in the left supraclavicular region/thoracic inlet, mediastinum, hilar regions, right retrocrural space, para-aortic retroperitoneum, and bilateral pelvic sidewall, compatible with metastatic disease. 2. Moderate volume ascites on this noncontrast CT study largely obscures peritoneal and omental disease although hypermetabolic plaque-like uptake in the anterior high abdomen is probably peritoneal with relatively bulky hypermetabolism in the omentum. These findings are also compatible with metastatic disease. 3. Hypermetabolic left upper lobe pulmonary nodule consistent with metastatic disease. Lung primary is considered less likely but not excluded. 4. Scattered areas of focal marrow uptake are suspicious for bony metastases although no underlying abnormality is evident on CT imaging. 5. Diffuse uptake in the  uterine anatomy compatible with the patient's known history of endometrial carcinoma. 6. As above, moderate volume ascites is new in the interval. 7. Enhancing lesion lower pole right kidney seen on previous CT of 03/28/2020 is less conspicuous on today's noncontrast CT imaging. Diffuse normal background FDG accumulation could obscure hypermetabolism in this lesion previously characterized as suspicious for renal cell carcinoma. 8. Bibasilar collapse/consolidation in the lower lobes. Electronically Signed   By: Misty Stanley M.D.   On: 05/30/2020 14:39   ECHOCARDIOGRAM COMPLETE  Result Date: 06/02/2020    ECHOCARDIOGRAM REPORT   Patient Name:   Amanda Lin. Derhammer Date of Exam: 06/02/2020 Medical Rec #:  [id number]          Height:       61.0 in Accession #:    [id number]         Weight:       258.8 lb Date of Birth:  [birth year]          BSA:  2.107 m Patient Age:    65 years           BP:           195/118 mmHg Patient Gender: F                  HR:           121 bpm. Exam Location:  Outpatient Procedure: 2D Echo, Color Doppler, Cardiac Doppler and Strain Analysis Indications:    Pre-chemo evaluation  History:        Patient has no prior history of Echocardiogram examinations.                 Risk Factors:Hypertension and Diabetes.  Sonographer:    Raquel Sarna Senior RDCS Referring Phys: [phone] Ghislaine Harcum [facility]  Sonographer Comments: Technically difficult due to body habitus. IMPRESSIONS  1. GLS -18.2%.  2. Left ventricular ejection fraction, by estimation, is 60 to 65%. The left ventricle has normal function. The left ventricle has no regional wall motion abnormalities. There is mild left ventricular hypertrophy. Left ventricular diastolic parameters are consistent with Grade I diastolic dysfunction (impaired relaxation).  3. Right ventricular systolic function is normal. The right ventricular size is normal.  4. The mitral valve is normal in structure. No evidence of mitral valve regurgitation. No evidence of  mitral stenosis.  5. The aortic valve is tricuspid. Aortic valve regurgitation is not visualized. No aortic stenosis is present.  6. The inferior vena cava is normal in size with greater than 50% respiratory variability, suggesting right atrial pressure of 3 mmHg. FINDINGS  Left Ventricle: Left ventricular ejection fraction, by estimation, is 60 to 65%. The left ventricle has normal function. The left ventricle has no regional wall motion abnormalities. The left ventricular internal cavity size was normal in size. There is  mild left ventricular hypertrophy. Left ventricular diastolic parameters are consistent with Grade I diastolic dysfunction (impaired relaxation). Right Ventricle: The right ventricular size is normal. Right ventricular systolic function is normal. Left Atrium: Left atrial size was normal in size. Right Atrium: Right atrial size was normal in size. Pericardium: Trivial pericardial effusion is present. Mitral Valve: The mitral valve is normal in structure. No evidence of mitral valve regurgitation. No evidence of mitral valve stenosis. Tricuspid Valve: The tricuspid valve is normal in structure. Tricuspid valve regurgitation is trivial. No evidence of tricuspid stenosis. Aortic Valve: The aortic valve is tricuspid. Aortic valve regurgitation is not visualized. No aortic stenosis is present. Pulmonic Valve: The pulmonic valve was not well visualized. Pulmonic valve regurgitation is not visualized. No evidence of pulmonic stenosis. Aorta: The aortic root is normal in size and structure. Venous: The inferior vena cava is normal in size with greater than 50% respiratory variability, suggesting right atrial pressure of 3 mmHg. . Additional Comments: GLS -18.2%.  LEFT VENTRICLE PLAX 2D LVIDd:         2.70 cm  Diastology LVIDs:         1.60 cm  LV e' medial:    5.22 cm/s LV PW:         1.40 cm  LV E/e' medial:  11.6 LV IVS:        1.20 cm  LV e' lateral:   9.25 cm/s LVOT diam:     1.80 cm  LV E/e'  lateral: 6.6 LV SV:         44 LV SV Index:   21 LVOT Area:     2.54 cm  RIGHT VENTRICLE  RV S prime:     15.50 cm/s TAPSE (M-mode): 2.1 cm LEFT ATRIUM             Index       RIGHT ATRIUM           Index LA diam:        3.00 cm 1.42 cm/m  RA Area:     11.90 cm LA Vol (A2C):   31.2 ml 14.80 ml/m RA Volume:   22.80 ml  10.82 ml/m LA Vol (A4C):   39.6 ml 18.79 ml/m LA Biplane Vol: 36.0 ml 17.08 ml/m  AORTIC VALVE LVOT Vmax:   121.00 cm/s LVOT Vmean:  84.000 cm/s LVOT VTI:    0.171 m  AORTA Ao Root diam: 2.70 cm Ao Asc diam:  2.60 cm MITRAL VALVE MV Area (PHT): 2.84 cm     SHUNTS MV Decel Time: 267 msec     Systemic VTI:  0.17 m MV E velocity: 60.80 cm/s   Systemic Diam: 1.80 cm MV A velocity: 112.00 cm/s MV E/A ratio:  0.54 Kirk Ruths MD Electronically signed by Kirk Ruths MD Signature Date/Time: 06/02/2020/2:02:31 PM    Final    IR IMAGING GUIDED PORT INSERTION  Result Date: 06/01/2020 CLINICAL DATA:  Endometrial carcinoma, access therapy EXAM: RIGHT INTERNAL JUGULAR SINGLE LUMEN POWER PORT CATHETER INSERTION Date:  06/01/2020 06/01/2020 11:43 am Radiologist:  Jerilynn Mages. Daryll Brod, MD Guidance:  Ultrasound fluoroscopic MEDICATIONS: Ancef 2 g; The antibiotic was administered within an appropriate time interval prior to skin puncture. ANESTHESIA/SEDATION: Versed 4.0 mg IV; Fentanyl 100 mcg IV; Moderate Sedation Time:  25 The patient was continuously monitored during the procedure by the interventional radiology nurse under my direct supervision. FLUOROSCOPY TIME:  0 minutes, 54 seconds (60 mGy) COMPLICATIONS: None immediate. CONTRAST:  None. PROCEDURE: Informed consent was obtained from the patient following explanation of the procedure, risks, benefits and alternatives. The patient understands, agrees and consents for the procedure. All questions were addressed. A time out was performed. Maximal barrier sterile technique utilized including caps, mask, sterile gowns, sterile gloves, large sterile drape, hand  hygiene, and 2% chlorhexidine scrub. Under sterile conditions and local anesthesia, right internal jugular micropuncture venous access was performed. Access was performed with ultrasound. Images were obtained for documentation of the patent right internal jugular vein. A guide wire was inserted followed by a transitional dilator. This allowed insertion of a guide wire and catheter into the IVC. Measurements were obtained from the SVC / RA junction back to the right IJ venotomy site. In the right infraclavicular chest, a subcutaneous pocket was created over the second anterior rib. This was done under sterile conditions and local anesthesia. 1% lidocaine with epinephrine was utilized for this. A 2.5 cm incision was made in the skin. Blunt dissection was performed to create a subcutaneous pocket over the right pectoralis major muscle. The pocket was flushed with saline vigorously. There was adequate hemostasis. The port catheter was assembled and checked for leakage. The port catheter was secured in the pocket with two retention sutures. The tubing was tunneled subcutaneously to the right venotomy site and inserted into the SVC/RA junction through a valved peel-away sheath. Position was confirmed with fluoroscopy. Images were obtained for documentation. The patient tolerated the procedure well. No immediate complications. Incisions were closed in a two layer fashion with 4 - 0 Vicryl suture. Dermabond was applied to the skin. The port catheter was accessed, blood was aspirated followed by saline and heparin flushes. Needle was removed.  A dry sterile dressing was applied. IMPRESSION: Ultrasound and fluoroscopically guided right internal jugular single lumen power port catheter insertion. Tip in the SVC/RA junction. Catheter ready for use. Electronically Signed   By: Jerilynn Mages.  Shick M.D.   On: 06/01/2020 12:33    I discussed the assessment and treatment plan with the patient. The patient was provided an opportunity to ask  questions and all were answered. The patient agreed with the plan and demonstrated an understanding of the instructions. The patient was advised to call back or seek an in-person evaluation if the symptoms worsen or if the condition fails to improve as anticipated.    I spent 20 minutes for the appointment reviewing test results, discuss management and coordination of care.  [name], MD 06/07/2020 12:54 PM

## 2020-06-08 ENCOUNTER — Telehealth: Payer: Self-pay | Admitting: Internal Medicine

## 2020-06-10 ENCOUNTER — Telehealth: Payer: Self-pay

## 2020-06-10 ENCOUNTER — Inpatient Hospital Stay: Payer: 59

## 2020-06-10 NOTE — Telephone Encounter (Signed)
Called to follow up. Given below message. She is feeling weak today. It seems to come and go, the weakness. She is only eating fruit. She feels she cannot eat anything but fruit. She is gong to try some saltine crackers. She is drinking water and gingerale. She is taking her Zofran and compazine. She has been sleeping in the recliner at her daughters house. Her feet have started swelling due to sleeping in the recliner. She is back home and will sleep in her bed now. She had loose stool last night and had accident on herself. No bm today so far.  She was scheduled today for 2nd covid vaccine at [facility]. She requested vaccine be moved to 2/18. appt moved. She does not want to come out of the house today due to the rain.

## 2020-06-10 NOTE — Telephone Encounter (Signed)
Called and told we will call her next week to see how she is doing. Instructed to call the office back if needed. Drink lots of fluid and start drinking Gatorade if needed. Try to increase food intake other than fruits. She verbalized understanding.

## 2020-06-10 NOTE — Telephone Encounter (Signed)
-----   Message from [name], MD sent at 06/10/2020  8:06 AM EST ----- Regarding: can you call her Can you call and check on her?

## 2020-06-14 ENCOUNTER — Telehealth: Payer: Self-pay

## 2020-06-14 NOTE — Telephone Encounter (Signed)
Called and given below message. She verbalized understanding. She is able to eat and drink more. She has been eating a lot of soup. Denies constipation and no more incontinent stools. Pain is good, she said is not bad. Leg swelling is better. She is able to sleep in her bed.

## 2020-06-14 NOTE — Telephone Encounter (Signed)
-----   Message from [name], MD sent at 06/14/2020  7:59 AM EST ----- Regarding: can you call about pain and constipation?

## 2020-06-14 NOTE — Telephone Encounter (Signed)
Called and left a message asking her to call the office back. 

## 2020-06-17 ENCOUNTER — Telehealth: Payer: Self-pay | Admitting: Clinical

## 2020-06-17 NOTE — Telephone Encounter (Signed)
Wellness check, as agreed upon by pt and [facility]; pt says she is resting/napping at the moment and  eating homemade soup to help her feel better,  and agrees to call Roselyn Reef at [phone] if she needs to talk further at a later date.

## 2020-06-24 ENCOUNTER — Inpatient Hospital Stay: Payer: 59 | Attending: Gynecologic Oncology

## 2020-06-24 ENCOUNTER — Inpatient Hospital Stay (HOSPITAL_BASED_OUTPATIENT_CLINIC_OR_DEPARTMENT_OTHER): Payer: 59

## 2020-06-24 ENCOUNTER — Other Ambulatory Visit: Payer: Self-pay | Admitting: Hematology and Oncology

## 2020-06-24 ENCOUNTER — Inpatient Hospital Stay: Payer: 59

## 2020-06-24 ENCOUNTER — Telehealth: Payer: Self-pay | Admitting: Hematology and Oncology

## 2020-06-24 ENCOUNTER — Other Ambulatory Visit: Payer: Self-pay

## 2020-06-24 ENCOUNTER — Inpatient Hospital Stay: Payer: 59 | Admitting: Hematology and Oncology

## 2020-06-24 ENCOUNTER — Other Ambulatory Visit: Payer: Self-pay | Admitting: Medical

## 2020-06-24 ENCOUNTER — Encounter: Payer: Self-pay | Admitting: Hematology and Oncology

## 2020-06-24 VITALS — BP 194/81

## 2020-06-24 DIAGNOSIS — I10 Essential (primary) hypertension: Secondary | ICD-10-CM

## 2020-06-24 DIAGNOSIS — C541 Malignant neoplasm of endometrium: Secondary | ICD-10-CM

## 2020-06-24 DIAGNOSIS — Z5111 Encounter for antineoplastic chemotherapy: Secondary | ICD-10-CM | POA: Diagnosis not present

## 2020-06-24 DIAGNOSIS — R634 Abnormal weight loss: Secondary | ICD-10-CM | POA: Diagnosis not present

## 2020-06-24 DIAGNOSIS — R112 Nausea with vomiting, unspecified: Secondary | ICD-10-CM

## 2020-06-24 DIAGNOSIS — C7800 Secondary malignant neoplasm of unspecified lung: Secondary | ICD-10-CM | POA: Diagnosis not present

## 2020-06-24 DIAGNOSIS — Z79899 Other long term (current) drug therapy: Secondary | ICD-10-CM | POA: Insufficient documentation

## 2020-06-24 DIAGNOSIS — Z23 Encounter for immunization: Secondary | ICD-10-CM

## 2020-06-24 DIAGNOSIS — K5909 Other constipation: Secondary | ICD-10-CM

## 2020-06-24 DIAGNOSIS — G893 Neoplasm related pain (acute) (chronic): Secondary | ICD-10-CM

## 2020-06-24 DIAGNOSIS — C778 Secondary and unspecified malignant neoplasm of lymph nodes of multiple regions: Secondary | ICD-10-CM

## 2020-06-24 DIAGNOSIS — R300 Dysuria: Secondary | ICD-10-CM

## 2020-06-24 DIAGNOSIS — R350 Frequency of micturition: Secondary | ICD-10-CM

## 2020-06-24 LAB — CBC WITH DIFFERENTIAL/PLATELET
Abs Immature Granulocytes: 0.09 10*3/uL — ABNORMAL HIGH (ref 0.00–0.07)
Basophils Absolute: 0 10*3/uL (ref 0.0–0.1)
Basophils Relative: 0 %
Eosinophils Absolute: 0 10*3/uL (ref 0.0–0.5)
Eosinophils Relative: 0 %
HCT: 32.2 % — ABNORMAL LOW (ref 36.0–46.0)
Hemoglobin: 10 g/dL — ABNORMAL LOW (ref 12.0–15.0)
Immature Granulocytes: 1 %
Lymphocytes Relative: 8 %
Lymphs Abs: 0.7 10*3/uL (ref 0.7–4.0)
MCH: 22.1 pg — ABNORMAL LOW (ref 26.0–34.0)
MCHC: 31.1 g/dL (ref 30.0–36.0)
MCV: 71.1 fL — ABNORMAL LOW (ref 80.0–100.0)
Monocytes Absolute: 0.4 10*3/uL (ref 0.1–1.0)
Monocytes Relative: 4 %
Neutro Abs: 7.9 10*3/uL — ABNORMAL HIGH (ref 1.7–7.7)
Neutrophils Relative %: 87 %
Platelets: 292 10*3/uL (ref 150–400)
RBC: 4.53 MIL/uL (ref 3.87–5.11)
RDW: 18.3 % — ABNORMAL HIGH (ref 11.5–15.5)
WBC: 9.1 10*3/uL (ref 4.0–10.5)
nRBC: 0 % (ref 0.0–0.2)

## 2020-06-24 LAB — COMPREHENSIVE METABOLIC PANEL
ALT: 7 U/L (ref 0–44)
AST: 12 U/L — ABNORMAL LOW (ref 15–41)
Albumin: 3.2 g/dL — ABNORMAL LOW (ref 3.5–5.0)
Alkaline Phosphatase: 89 U/L (ref 38–126)
Anion gap: 11 (ref 5–15)
BUN: 7 mg/dL — ABNORMAL LOW (ref 8–23)
CO2: 26 mmol/L (ref 22–32)
Calcium: 8.9 mg/dL (ref 8.9–10.3)
Chloride: 103 mmol/L (ref 98–111)
Creatinine, Ser: 0.82 mg/dL (ref 0.44–1.00)
GFR, Estimated: >60 mL/min (ref >60)
Glucose, Bld: 158 mg/dL — ABNORMAL HIGH (ref 70–99)
Potassium: 3.1 mmol/L — ABNORMAL LOW (ref 3.5–5.1)
Sodium: 140 mmol/L (ref 135–145)
Total Bilirubin: 0.5 mg/dL (ref 0.3–1.2)
Total Protein: 7.2 g/dL (ref 6.5–8.1)

## 2020-06-24 LAB — URINALYSIS, COMPLETE (UACMP) WITH MICROSCOPIC
Bacteria, UA: NONE SEEN
Bilirubin Urine: NEGATIVE
Glucose, UA: 50 mg/dL — AB
Ketones, ur: 5 mg/dL — AB
Leukocytes,Ua: NEGATIVE
Nitrite: NEGATIVE
Protein, ur: NEGATIVE mg/dL
Specific Gravity, Urine: 1.012 (ref 1.005–1.030)
pH: 7 (ref 5.0–8.0)

## 2020-06-24 LAB — MAGNESIUM: Magnesium: 1.5 mg/dL — ABNORMAL LOW (ref 1.7–2.4)

## 2020-06-24 MED ORDER — INFLUENZA VAC SPLIT QUAD 0.5 ML IM SUSY
0.5000 mL | PREFILLED_SYRINGE | Freq: Once | INTRAMUSCULAR | Status: AC
  Administered 2020-06-24: 0.5 mL via INTRAMUSCULAR

## 2020-06-24 MED ORDER — OXYCODONE HCL 5 MG PO TABS: 5.0000 mg | ORAL_TABLET | ORAL | 0 refills | Status: DC | PRN

## 2020-06-24 MED ORDER — SODIUM CHLORIDE 0.9 % IV SOLN
Freq: Once | INTRAVENOUS | Status: AC
  Filled 2020-06-24: qty 250

## 2020-06-24 MED ORDER — CARBOPLATIN CHEMO INJECTION 600 MG/60ML
737.5000 mg | Freq: Once | INTRAVENOUS | Status: AC
Start: 2020-06-24 — End: 2020-06-24
  Administered 2020-06-24: 740 mg via INTRAVENOUS
  Filled 2020-06-24: qty 74

## 2020-06-24 MED ORDER — PHENAZOPYRIDINE HCL 100 MG PO TABS
100.0000 mg | ORAL_TABLET | Freq: Three times a day (TID) | ORAL | 0 refills | Status: DC | PRN
Start: 2020-06-24 — End: 2020-07-01

## 2020-06-24 MED ORDER — SODIUM CHLORIDE 0.9% FLUSH
10.0000 mL | INTRAVENOUS | Status: DC | PRN
  Administered 2020-06-24: 10 mL
  Filled 2020-06-24: qty 10

## 2020-06-24 MED ORDER — DIPHENHYDRAMINE HCL 50 MG/ML IJ SOLN
25.0000 mg | Freq: Once | INTRAMUSCULAR | Status: AC
  Administered 2020-06-24: 25 mg via INTRAVENOUS

## 2020-06-24 MED ORDER — FOSAPREPITANT DIMEGLUMINE INJECTION 150 MG
150.0000 mg | Freq: Once | INTRAVENOUS | Status: AC
  Administered 2020-06-24: 150 mg via INTRAVENOUS
  Filled 2020-06-24: qty 150

## 2020-06-24 MED ORDER — SULFAMETHOXAZOLE-TRIMETHOPRIM 800-160 MG PO TABS: 1.0000 | ORAL_TABLET | Freq: Two times a day (BID) | ORAL | 0 refills | Status: DC

## 2020-06-24 MED ORDER — SODIUM CHLORIDE 0.9 % IV SOLN
131.2500 mg/m2 | Freq: Once | INTRAVENOUS | Status: AC
  Administered 2020-06-24: 288 mg via INTRAVENOUS
  Filled 2020-06-24: qty 48

## 2020-06-24 MED ORDER — TRASTUZUMAB-DKST CHEMO 150 MG IV SOLR
6.0000 mg/kg | Freq: Once | INTRAVENOUS | Status: AC
  Administered 2020-06-24: 672 mg via INTRAVENOUS
  Filled 2020-06-24: qty 32

## 2020-06-24 MED ORDER — DIPHENHYDRAMINE HCL 50 MG/ML IJ SOLN
INTRAMUSCULAR | Status: AC
  Filled 2020-06-24: qty 1

## 2020-06-24 MED ORDER — FAMOTIDINE IN NACL 20-0.9 MG/50ML-% IV SOLN
INTRAVENOUS | Status: AC
  Filled 2020-06-24: qty 50

## 2020-06-24 MED ORDER — ACETAMINOPHEN 325 MG PO TABS
ORAL_TABLET | ORAL | Status: AC
  Filled 2020-06-24: qty 2

## 2020-06-24 MED ORDER — PALONOSETRON HCL INJECTION 0.25 MG/5ML
0.2500 mg | Freq: Once | INTRAVENOUS | Status: AC
  Administered 2020-06-24: 0.25 mg via INTRAVENOUS

## 2020-06-24 MED ORDER — INFLUENZA VAC SPLIT QUAD 0.5 ML IM SUSY
PREFILLED_SYRINGE | INTRAMUSCULAR | Status: AC
  Filled 2020-06-24: qty 0.5

## 2020-06-24 MED ORDER — HEPARIN SOD (PORK) LOCK FLUSH 100 UNIT/ML IV SOLN
500.0000 [IU] | Freq: Once | INTRAVENOUS | Status: AC | PRN
  Administered 2020-06-24: 500 [IU]
  Filled 2020-06-24: qty 5

## 2020-06-24 MED ORDER — PALONOSETRON HCL INJECTION 0.25 MG/5ML
INTRAVENOUS | Status: AC
  Filled 2020-06-24: qty 5

## 2020-06-24 MED ORDER — SODIUM CHLORIDE 0.9 % IV SOLN
10.0000 mg | Freq: Once | INTRAVENOUS | Status: AC
  Administered 2020-06-24: 10 mg via INTRAVENOUS
  Filled 2020-06-24: qty 10

## 2020-06-24 MED ORDER — ACETAMINOPHEN 325 MG PO TABS
650.0000 mg | ORAL_TABLET | Freq: Once | ORAL | Status: AC
  Administered 2020-06-24: 650 mg via ORAL

## 2020-06-24 MED ORDER — SODIUM CHLORIDE 0.9% FLUSH
10.0000 mL | Freq: Once | INTRAVENOUS | Status: AC
  Administered 2020-06-24: 10 mL
  Filled 2020-06-24: qty 10

## 2020-06-24 MED ORDER — FAMOTIDINE IN NACL 20-0.9 MG/50ML-% IV SOLN
20.0000 mg | Freq: Once | INTRAVENOUS | Status: AC
  Administered 2020-06-24: 20 mg via INTRAVENOUS

## 2020-06-24 NOTE — Assessment & Plan Note (Signed)
This is due to poor oral intake She has significant electrolyte imbalance but for now, I do not plan to address the electrolyte imbalance I will reduce the dose of her chemotherapy today

## 2020-06-24 NOTE — Patient Instructions (Signed)
[facility] Discharge Instructions for Patients Receiving Chemotherapy  Today you received the following chemotherapy agents Traztuzumab, Paclitaxel(Taxol), Carboplatin.  To help prevent nausea and vomiting after your treatment, we encourage you to take your nausea medication as directed.    If you develop nausea and vomiting that is not controlled by your nausea medication, call the clinic.   BELOW ARE SYMPTOMS THAT SHOULD BE REPORTED IMMEDIATELY:  *FEVER GREATER THAN 100.5 F  *CHILLS WITH OR WITHOUT FEVER  NAUSEA AND VOMITING THAT IS NOT CONTROLLED WITH YOUR NAUSEA MEDICATION  *UNUSUAL SHORTNESS OF BREATH  *UNUSUAL BRUISING OR BLEEDING  TENDERNESS IN MOUTH AND THROAT WITH OR WITHOUT PRESENCE OF ULCERS  *URINARY PROBLEMS  *BOWEL PROBLEMS  UNUSUAL RASH Items with * indicate a potential emergency and should be followed up as soon as possible.  Feel free to call the clinic should you have any questions or concerns. The clinic phone number is [phone].  Please show the [facility] at check-in to the Emergency Department and triage nurse.

## 2020-06-24 NOTE — Progress Notes (Signed)
[place] OFFICE PROGRESS NOTE  Patient Care Team: Amanda Bang, DO as PCP - General (Family Medicine) Amanda Mink [name], RN as Oncology Nurse Navigator (Oncology)  ASSESSMENT & PLAN:  Endometrial carcinoma [facility]) She has lost a lot of weight but overall, I think she is responding well to treatment Continue aggressive supportive care I will set up virtual visit for symptom management next week I recommend minimum 3 cycles of treatment before repeating imaging study With her recent weight change, I will adjust the dose of her treatment accordingly  Cancer associated pain Her pain is well controlled so far We discussed narcotic refill policy  Nausea with vomiting She continues to have frequent nausea and occasional vomiting but overall she is able to tolerate oral intake She will continue antiemetics as needed  Hypertension Her blood pressure is high, likely exacerbated by anxiety For now, due to her recent significant weight loss, I do not plan to add additional blood pressure medications  Other constipation We discussed the importance of aggressive laxative therapy  Weight loss This is due to poor oral intake She has significant electrolyte imbalance but for now, I do not plan to address the electrolyte imbalance I will reduce the dose of her chemotherapy today   No orders of the defined types were placed in this encounter.   All questions were answered. The patient knows to call the clinic with any problems, questions or concerns. The total time spent in the appointment was 30 minutes encounter with patients including review of chart and various tests results, discussions about plan of care and coordination of care plan   Amanda Lark, MD 06/24/2020 12:56 PM  INTERVAL HISTORY: Please see below for problem oriented charting. 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     02/26/2020 Imaging   1. 3 mm nonobstructing renal stone within the left kidney. 2. Findings suggestive of a small hemorrhagic cyst within the lower pole of the right kidney, with an anterior right lower pole heterogeneous renal soft tissue mass. MRI correlation is recommended, as an underlying neoplastic process cannot be excluded. 3. Colonic diverticulosis. 4. Multiple exophytic uterine fibroids. 5. Evidence of prior cholecystectomy.   03/28/2020 Imaging   2.4 cm enhancing mass in the anterior right lower kidney, suspicious for solid renal neoplasm such as renal cell carcinoma.   Single right renal artery and vein.  No renal vein invasion.   Small retroperitoneal nodes, measuring up to 9 mm short axis, mildly prominent but technically within the upper limits of normal. Attention on follow-up is suggested.   4 x 5 mm nodule in the medial right lower lobe. This is nonspecific and unlikely to reflect a metastasis but warrants attention on follow-up. Consider dedicated CT chest in 3-6 months.   3 mm nonobstructing left lower pole renal calculus. No hydronephrosis.     04/04/2020 Imaging   1. 10 mm endometrial stripe thickness. This is considered abnormal in a postmenopausal female with bleeding. In the setting of post-menopausal bleeding, endometrial  sampling is indicated to exclude carcinoma.  2. Multiple uterine fibroids including potential submucosal fibroid in the lower uterine segment.   04/19/2020 Pathology Results   A. ENDOMETRIUM, BIOPSY:  -   High-grade carcinoma  Immunohistochemical and morphometric analysis performed manually   The tumor cells are POSITIVE for Her2 (3+).    04/19/2020 Procedure   She was seen by Dr. Astrid Drafts on 04/19/2020.  At that visit her cervix was noted to be nodular and firm and abnormal appearing.  Pap taken at that visit was positive for adenocarcinoma, endometrial.  An endometrial Pipelle biopsy was taken at the same visit which revealed high-grade endometrial cancer, suspicious for serous carcinoma.     05/12/2020 Cancer Staging   Staging form: Corpus Uteri - Carcinoma and Carcinosarcoma, AJCC 8th Edition - Clinical stage from 05/12/2020: FIGO Stage IVB (cT3, cN2, cM1) - Signed by Amanda Lark, MD on 05/31/2020   05/30/2020 PET scan   1. Hypermetabolic lymph nodes identified in the left supraclavicular region/thoracic inlet, mediastinum, hilar regions, right retrocrural space, para-aortic retroperitoneum, and bilateral pelvic sidewall, compatible with metastatic disease. 2. Moderate volume ascites on this noncontrast CT study largely obscures peritoneal and omental disease although hypermetabolic plaque-like uptake in the anterior high abdomen is probably peritoneal with relatively bulky hypermetabolism in the omentum. These findings are also compatible with metastatic disease. 3. Hypermetabolic left upper lobe pulmonary nodule consistent with metastatic disease. Lung primary is considered less likely but not excluded. 4. Scattered areas of focal marrow uptake are suspicious for bony metastases although no underlying abnormality is evident on CT imaging. 5. Diffuse uptake in the uterine anatomy compatible with the patient's known history of endometrial carcinoma. 6. As above, moderate volume ascites is new in the interval. 7. Enhancing lesion lower pole right kidney seen on previous CT of 03/28/2020 is less conspicuous on today's noncontrast CT imaging. Diffuse normal background FDG accumulation could obscure  hypermetabolism in this lesion previously characterized as suspicious for renal cell carcinoma. 8. Bibasilar collapse/consolidation in the lower lobes.   06/01/2020 Procedure   Ultrasound and fluoroscopically guided right internal jugular single lumen power port catheter insertion. Tip in the SVC/RA junction. Catheter ready for use.   06/02/2020 Echocardiogram    1. GLS -18.2%.  2. Left ventricular ejection fraction, by estimation, is 60 to 65%. The left ventricle has normal function. The left ventricle has no regional wall motion abnormalities. There is mild left ventricular hypertrophy. Left ventricular diastolic parameters are consistent with Grade I diastolic dysfunction (impaired relaxation).  3. Right ventricular systolic function is normal. The right ventricular size is normal.  4. The mitral valve is normal in structure. No evidence of mitral valve regurgitation. No evidence of mitral stenosis.  5. The aortic valve is tricuspid. Aortic valve regurgitation is not visualized. No aortic stenosis is present.  6. The inferior vena cava is normal in size with greater than 50% respiratory variability, suggesting right atrial pressure of 3 mmHg.     06/03/2020 -  Chemotherapy    Patient is on Treatment Plan: UTERINE SEROUS CARCINOMA CARBOPLATIN + PACLITAXEL + TRASTUZUMAB Q21D X 6 CYCLES / TRASTUZUMAB Q21D      Metastasis to lymph nodes ([place])  05/31/2020 Initial Diagnosis   Metastasis to lymph nodes ([place])   06/03/2020 -  Chemotherapy    Patient is on Treatment Plan: UTERINE SEROUS CARCINOMA CARBOPLATIN + PACLITAXEL + TRASTUZUMAB Q21D X 6 CYCLES / TRASTUZUMAB Q21D      Metastasis to lung ([place])  05/31/2020 Initial Diagnosis  Metastasis to lung [facility])   06/03/2020 -  Chemotherapy    Patient is on Treatment Plan: UTERINE SEROUS CARCINOMA CARBOPLATIN + PACLITAXEL + TRASTUZUMAB Q21D X 6 CYCLES / TRASTUZUMAB Q21D        REVIEW OF SYSTEMS:   Constitutional: Denies fevers, chills  Eyes:  Denies blurriness of vision Ears, nose, mouth, throat, and face: Denies mucositis or sore throat Respiratory: Denies cough, dyspnea or wheezes Cardiovascular: Denies palpitation, chest discomfort or lower extremity swelling Skin: Denies abnormal skin rashes Lymphatics: Denies new lymphadenopathy or easy bruising Neurological:Denies numbness, tingling or new weaknesses Behavioral/Psych: Mood is stable, no new changes  All other systems were reviewed with the patient and are negative.  I have reviewed the past medical history, past surgical history, social history and family history with the patient and they are unchanged from previous note.  ALLERGIES:  has No Known Allergies.  MEDICATIONS:  Current Outpatient Medications  Medication Sig Dispense Refill  . dexamethasone (DECADRON) 4 MG tablet Take 2 tabs at the night before and 2 tab the morning of chemotherapy, every 3 weeks, by mouth x 6 cycles 36 tablet 6  . lactulose (CHRONULAC) 10 GM/15ML solution Take 30 mLs (20 g total) by mouth 3 (three) times daily. 946 mL 1  . lidocaine-prilocaine (EMLA) cream Apply to affected area once 30 g 3  . losartan (COZAAR) 50 MG tablet Take 1 tablet (50 mg total) by mouth daily. 90 tablet 1  . ondansetron (ZOFRAN) 8 MG tablet Take 1 tablet (8 mg total) by mouth every 8 (eight) hours as needed for refractory nausea / vomiting. Start on day 3 after carboplatin chemo. 30 tablet 1  . oxyCODONE (OXY IR/ROXICODONE) 5 MG immediate release tablet Take 1 tablet (5 mg total) by mouth every 4 (four) hours as needed for severe pain. 60 tablet 0  . polyethylene glycol (MIRALAX / GLYCOLAX) 17 g packet Take 17 g by mouth daily.    . prochlorperazine (COMPAZINE) 10 MG tablet Take 1 tablet (10 mg total) by mouth every 6 (six) hours as needed (Nausea or vomiting). 30 tablet 1   No current facility-administered medications for this visit.   Facility-Administered Medications Ordered in Other Visits  Medication Dose Route  Frequency Provider Last Rate Last Admin  . CARBOplatin (PARAPLATIN) 740 mg in sodium chloride 0.9 % 250 mL chemo infusion  740 mg Intravenous Once Alvy Bimler, [place] Deleo, MD      . heparin lock flush 100 unit/mL  500 Units Intracatheter Once PRN Alvy Bimler, Quaron Delacruz, MD      . influenza vac split quadrivalent PF (FLUARIX) injection 0.5 mL  0.5 mL Intramuscular Once Alvy Bimler, Orva Riles, MD      . PACLitaxel (TAXOL) 288 mg in sodium chloride 0.9 % 250 mL chemo infusion (> 50m/m2)  131.25 mg/m2 (Treatment Plan Recorded) Intravenous Once GHeath Lark MD 109 mL/hr at 06/24/20 1253 288 mg at 06/24/20 1253  . sodium chloride flush (NS) 0.9 % injection 10 mL  10 mL Intracatheter PRN GAlvy Bimler Verdis Koval, MD        PHYSICAL EXAMINATION: ECOG PERFORMANCE STATUS: 1 - Symptomatic but completely ambulatory  Vitals:   06/24/20 1042  BP: (!) 198/101  Pulse: (!) 101  Resp: 18  Temp: (!) 97.5 F (36.4 C)  SpO2: 98%   Filed Weights   06/24/20 1042  Weight: 247 lb (112 kg)    GENERAL:alert, no distress and comfortable SKIN: skin color, texture, turgor are normal, no rashes or significant lesions EYES: normal, Conjunctiva are pink and  non-injected, sclera clear OROPHARYNX:no exudate, no erythema and lips, buccal mucosa, and tongue normal  NECK: supple, thyroid normal size, non-tender, without nodularity LYMPH:  no palpable lymphadenopathy in the cervical, axillary or inguinal LUNGS: clear to auscultation and percussion with normal breathing effort HEART: regular rate & rhythm and no murmurs and no lower extremity edema ABDOMEN:abdomen soft, non-tender and normal bowel sounds Musculoskeletal:no cyanosis of digits and no clubbing  NEURO: alert & oriented x 3 with fluent speech, no focal motor/sensory deficits  LABORATORY DATA:  I have reviewed the data as listed    Component Value Date/Time   NA 140 06/24/2020 1013   NA 138 02/23/2020 1113   K 3.1 (L) 06/24/2020 1013   CL 103 06/24/2020 1013   CO2 26 06/24/2020 1013   GLUCOSE  158 (H) 06/24/2020 1013   BUN 7 (L) 06/24/2020 1013   BUN 15 02/23/2020 1113   CREATININE 0.82 06/24/2020 1013   CALCIUM 8.9 06/24/2020 1013   PROT 7.2 06/24/2020 1013   PROT 7.8 02/23/2020 1113   ALBUMIN 3.2 (L) 06/24/2020 1013   ALBUMIN 4.4 02/23/2020 1113   AST 12 (L) 06/24/2020 1013   ALT 7 06/24/2020 1013   ALKPHOS 89 06/24/2020 1013   BILITOT 0.5 06/24/2020 1013   BILITOT 0.6 02/23/2020 1113   GFRNONAA >60 06/24/2020 1013   GFRAA 91 02/23/2020 1113    No results found for: SPEP, UPEP  Lab Results  Component Value Date   WBC 9.1 06/24/2020   NEUTROABS 7.9 (H) 06/24/2020   HGB 10.0 (L) 06/24/2020   HCT 32.2 (L) 06/24/2020   MCV 71.1 (L) 06/24/2020   PLT 292 06/24/2020      Chemistry      Component Value Date/Time   NA 140 06/24/2020 1013   NA 138 02/23/2020 1113   K 3.1 (L) 06/24/2020 1013   CL 103 06/24/2020 1013   CO2 26 06/24/2020 1013   BUN 7 (L) 06/24/2020 1013   BUN 15 02/23/2020 1113   CREATININE 0.82 06/24/2020 1013      Component Value Date/Time   CALCIUM 8.9 06/24/2020 1013   ALKPHOS 89 06/24/2020 1013   AST 12 (L) 06/24/2020 1013   ALT 7 06/24/2020 1013   BILITOT 0.5 06/24/2020 1013   BILITOT 0.6 02/23/2020 1113       RADIOGRAPHIC STUDIES: I have personally reviewed the radiological images as listed and agreed with the findings in the report. NM PET Image Initial (PI) Skull Base To Thigh  Result Date: 05/30/2020 CLINICAL DATA:  Initial treatment strategy for endometrial cancer. Also history of enhancing renal mass lower pole right kidney. EXAM: NUCLEAR MEDICINE PET SKULL BASE TO THIGH TECHNIQUE: 13.1 mCi F-18 FDG was injected intravenously. Full-ring PET imaging was performed from the skull base to thigh after the radiotracer. CT data was obtained and used for attenuation correction and anatomic localization. Fasting blood glucose: 167 mg/dl COMPARISON:  Abdomen/pelvis CT 03/28/2020. FINDINGS: Mediastinal blood pool activity: SUV max 2.8 Liver  activity: SUV max NA NECK: Hypermetabolic supraclavicular lymphadenopathy identified on the left. Incidental CT findings: None. CHEST: 10 mm short axis node towards the thoracic inlet (49/4) demonstrates SUV max = 7.6. Hypermetabolic lymph nodes are seen in the anterior mediastinum. 8 mm lymph node between the esophagus in the aorta on image 70 of series 4 demonstrates SUV max = 6.6. Hypermetabolic lymph nodes are seen in the AP window and axillary regions. 10 mm right retrocrural node on 98/4 demonstrates SUV max = 6.0. 12 mm  left upper lobe pulmonary nodule on 75/4 demonstrates SUV max = 4.5. Incidental CT findings: Bilateral collapse/consolidative opacity seen in the lung bases. Tiny bilateral pleural effusions noted. ABDOMEN/PELVIS: 2 cm plaque-like peritoneal lesion anterior to the left liver demonstrates SUV max = 7.3 Omental disease in the right paramidline abdomen is not well demonstrated on noncontrast CT imaging with SUV max = 10.9 (CT image 136/4). Midline omental disease in the anterior pelvis is not well demonstrated on noncontrast CT imaging with SUV max = 14. 15 mm left para-aortic node on 482/7 is hypermetabolic. 13 mm left pelvic sidewall lymph node on 078/6 is hypermetabolic with SUV max = 8.9. Diffuse hypermetabolism noted in the uterus with SUV max = of 13.1. Incidental CT findings: Moderate volume ascites is new in the interval. Gallbladder surgically absent. 13 mm hypoattenuating lesion posterior lower pole right kidney is stable. Lesion of concern in the anterior lower pole right kidney is obscured by background renal parenchymal uptake. SKELETON: Scattered areas of hypermetabolism in the bony anatomy are suspicious for marrow involvement. Index uptake in the right aspect of the S1 vertebral body demonstrates SUV max = 5.0 with no underlying osseous abnormality by CT imaging. Focal uptake in the posterior left iliac crest demonstrates SUV max = 5.1 with no discrete lesion by CT. Incidental CT  findings: No worrisome lytic or sclerotic osseous abnormality. IMPRESSION: 1. Hypermetabolic lymph nodes identified in the left supraclavicular region/thoracic inlet, mediastinum, hilar regions, right retrocrural space, para-aortic retroperitoneum, and bilateral pelvic sidewall, compatible with metastatic disease. 2. Moderate volume ascites on this noncontrast CT study largely obscures peritoneal and omental disease although hypermetabolic plaque-like uptake in the anterior high abdomen is probably peritoneal with relatively bulky hypermetabolism in the omentum. These findings are also compatible with metastatic disease. 3. Hypermetabolic left upper lobe pulmonary nodule consistent with metastatic disease. Lung primary is considered less likely but not excluded. 4. Scattered areas of focal marrow uptake are suspicious for bony metastases although no underlying abnormality is evident on CT imaging. 5. Diffuse uptake in the uterine anatomy compatible with the patient's known history of endometrial carcinoma. 6. As above, moderate volume ascites is new in the interval. 7. Enhancing lesion lower pole right kidney seen on previous CT of 03/28/2020 is less conspicuous on today's noncontrast CT imaging. Diffuse normal background FDG accumulation could obscure hypermetabolism in this lesion previously characterized as suspicious for renal cell carcinoma. 8. Bibasilar collapse/consolidation in the lower lobes. Electronically Signed   By: Misty Stanley M.D.   On: 05/30/2020 14:39   ECHOCARDIOGRAM COMPLETE  Result Date: 06/02/2020    ECHOCARDIOGRAM REPORT   Patient Name:   Amanda Lin. Lubinski Date of Exam: 06/02/2020 Medical Rec #:  [id number]          Height:       61.0 in Accession #:    [id number]         Weight:       258.8 lb Date of Birth:  [birth year]          BSA:          2.107 m Patient Age:    13 years           BP:           195/118 mmHg Patient Gender: F                  HR:           121 bpm. Exam Location:   Outpatient Procedure:  2D Echo, Color Doppler, Cardiac Doppler and Strain Analysis Indications:    Pre-chemo evaluation  History:        Patient has no prior history of Echocardiogram examinations.                 Risk Factors:Hypertension and Diabetes.  Sonographer:    Raquel Sarna Senior RDCS Referring Phys: [phone] Zyaire Mccleod [facility]  Sonographer Comments: Technically difficult due to body habitus. IMPRESSIONS  1. GLS -18.2%.  2. Left ventricular ejection fraction, by estimation, is 60 to 65%. The left ventricle has normal function. The left ventricle has no regional wall motion abnormalities. There is mild left ventricular hypertrophy. Left ventricular diastolic parameters are consistent with Grade I diastolic dysfunction (impaired relaxation).  3. Right ventricular systolic function is normal. The right ventricular size is normal.  4. The mitral valve is normal in structure. No evidence of mitral valve regurgitation. No evidence of mitral stenosis.  5. The aortic valve is tricuspid. Aortic valve regurgitation is not visualized. No aortic stenosis is present.  6. The inferior vena cava is normal in size with greater than 50% respiratory variability, suggesting right atrial pressure of 3 mmHg. FINDINGS  Left Ventricle: Left ventricular ejection fraction, by estimation, is 60 to 65%. The left ventricle has normal function. The left ventricle has no regional wall motion abnormalities. The left ventricular internal cavity size was normal in size. There is  mild left ventricular hypertrophy. Left ventricular diastolic parameters are consistent with Grade I diastolic dysfunction (impaired relaxation). Right Ventricle: The right ventricular size is normal. Right ventricular systolic function is normal. Left Atrium: Left atrial size was normal in size. Right Atrium: Right atrial size was normal in size. Pericardium: Trivial pericardial effusion is present. Mitral Valve: The mitral valve is normal in structure. No evidence of mitral  valve regurgitation. No evidence of mitral valve stenosis. Tricuspid Valve: The tricuspid valve is normal in structure. Tricuspid valve regurgitation is trivial. No evidence of tricuspid stenosis. Aortic Valve: The aortic valve is tricuspid. Aortic valve regurgitation is not visualized. No aortic stenosis is present. Pulmonic Valve: The pulmonic valve was not well visualized. Pulmonic valve regurgitation is not visualized. No evidence of pulmonic stenosis. Aorta: The aortic root is normal in size and structure. Venous: The inferior vena cava is normal in size with greater than 50% respiratory variability, suggesting right atrial pressure of 3 mmHg. . Additional Comments: GLS -18.2%.  LEFT VENTRICLE PLAX 2D LVIDd:         2.70 cm  Diastology LVIDs:         1.60 cm  LV e' medial:    5.22 cm/s LV PW:         1.40 cm  LV E/e' medial:  11.6 LV IVS:        1.20 cm  LV e' lateral:   9.25 cm/s LVOT diam:     1.80 cm  LV E/e' lateral: 6.6 LV SV:         44 LV SV Index:   21 LVOT Area:     2.54 cm  RIGHT VENTRICLE RV S prime:     15.50 cm/s TAPSE (M-mode): 2.1 cm LEFT ATRIUM             Index       RIGHT ATRIUM           Index LA diam:        3.00 cm 1.42 cm/m  RA Area:     11.90 cm LA Vol (  A2C):   31.2 ml 14.80 ml/m RA Volume:   22.80 ml  10.82 ml/m LA Vol (A4C):   39.6 ml 18.79 ml/m LA Biplane Vol: 36.0 ml 17.08 ml/m  AORTIC VALVE LVOT Vmax:   121.00 cm/s LVOT Vmean:  84.000 cm/s LVOT VTI:    0.171 m  AORTA Ao Root diam: 2.70 cm Ao Asc diam:  2.60 cm MITRAL VALVE MV Area (PHT): 2.84 cm     SHUNTS MV Decel Time: 267 msec     Systemic VTI:  0.17 m MV E velocity: 60.80 cm/s   Systemic Diam: 1.80 cm MV A velocity: 112.00 cm/s MV E/A ratio:  0.54 Kirk Ruths MD Electronically signed by Kirk Ruths MD Signature Date/Time: 06/02/2020/2:02:31 PM    Final    IR IMAGING GUIDED PORT INSERTION  Result Date: 06/01/2020 CLINICAL DATA:  Endometrial carcinoma, access therapy EXAM: RIGHT INTERNAL JUGULAR SINGLE LUMEN POWER  PORT CATHETER INSERTION Date:  06/01/2020 06/01/2020 11:43 am Radiologist:  Jerilynn Mages. Daryll Brod, MD Guidance:  Ultrasound fluoroscopic MEDICATIONS: Ancef 2 g; The antibiotic was administered within an appropriate time interval prior to skin puncture. ANESTHESIA/SEDATION: Versed 4.0 mg IV; Fentanyl 100 mcg IV; Moderate Sedation Time:  25 The patient was continuously monitored during the procedure by the interventional radiology nurse under my direct supervision. FLUOROSCOPY TIME:  0 minutes, 54 seconds (60 mGy) COMPLICATIONS: None immediate. CONTRAST:  None. PROCEDURE: Informed consent was obtained from the patient following explanation of the procedure, risks, benefits and alternatives. The patient understands, agrees and consents for the procedure. All questions were addressed. A time out was performed. Maximal barrier sterile technique utilized including caps, mask, sterile gowns, sterile gloves, large sterile drape, hand hygiene, and 2% chlorhexidine scrub. Under sterile conditions and local anesthesia, right internal jugular micropuncture venous access was performed. Access was performed with ultrasound. Images were obtained for documentation of the patent right internal jugular vein. A guide wire was inserted followed by a transitional dilator. This allowed insertion of a guide wire and catheter into the IVC. Measurements were obtained from the SVC / RA junction back to the right IJ venotomy site. In the right infraclavicular chest, a subcutaneous pocket was created over the second anterior rib. This was done under sterile conditions and local anesthesia. 1% lidocaine with epinephrine was utilized for this. A 2.5 cm incision was made in the skin. Blunt dissection was performed to create a subcutaneous pocket over the right pectoralis major muscle. The pocket was flushed with saline vigorously. There was adequate hemostasis. The port catheter was assembled and checked for leakage. The port catheter was secured in the  pocket with two retention sutures. The tubing was tunneled subcutaneously to the right venotomy site and inserted into the SVC/RA junction through a valved peel-away sheath. Position was confirmed with fluoroscopy. Images were obtained for documentation. The patient tolerated the procedure well. No immediate complications. Incisions were closed in a two layer fashion with 4 - 0 Vicryl suture. Dermabond was applied to the skin. The port catheter was accessed, blood was aspirated followed by saline and heparin flushes. Needle was removed. A dry sterile dressing was applied. IMPRESSION: Ultrasound and fluoroscopically guided right internal jugular single lumen power port catheter insertion. Tip in the SVC/RA junction. Catheter ready for use. Electronically Signed   By: Jerilynn Mages.  Shick M.D.   On: 06/01/2020 12:33

## 2020-06-24 NOTE — Progress Notes (Signed)
Patient c/o dysuria and urinary frequency. Sandi Mealy, PA-C notified. Orders received, repeated, and confirmed. Urine specimen collected and taken to main hospital lab.

## 2020-06-24 NOTE — Assessment & Plan Note (Signed)
Her blood pressure is high, likely exacerbated by anxiety For now, due to her recent significant weight loss, I do not plan to add additional blood pressure medications

## 2020-06-24 NOTE — Patient Instructions (Signed)
Implanted Port Insertion, Care After This sheet gives you information about how to care for yourself after your procedure. Your health care provider may also give you more specific instructions. If you have problems or questions, contact your health care provider. What can I expect after the procedure? After the procedure, it is common to have:  Discomfort at the port insertion site.  Bruising on the skin over the port. This should improve over 3-4 days. Follow these instructions at home: Port care  After your port is placed, you will get a manufacturer's information card. The card has information about your port. Keep this card with you at all times.  Take care of the port as told by your health care provider. Ask your health care provider if you or a family member can get training for taking care of the port at home. A home health care nurse may also take care of the port.  Make sure to remember what type of port you have. Incision care  Follow instructions from your health care provider about how to take care of your port insertion site. Make sure you: ? Wash your hands with soap and water before and after you change your bandage (dressing). If soap and water are not available, use hand sanitizer. ? Change your dressing as told by your health care provider. ? Leave stitches (sutures), skin glue, or adhesive strips in place. These skin closures may need to stay in place for 2 weeks or longer. If adhesive strip edges start to loosen and curl up, you may trim the loose edges. Do not remove adhesive strips completely unless your health care provider tells you to do that.  Check your port insertion site every day for signs of infection. Check for: ? Redness, swelling, or pain. ? Fluid or blood. ? Warmth. ? Pus or a bad smell.      Activity  Return to your normal activities as told by your health care provider. Ask your health care provider what activities are safe for you.  Do not  lift anything that is heavier than 10 lb (4.5 kg), or the limit that you are told, until your health care provider says that it is safe. General instructions  Take over-the-counter and prescription medicines only as told by your health care provider.  Do not take baths, swim, or use a hot tub until your health care provider approves. Ask your health care provider if you may take showers. You may only be allowed to take sponge baths.  Do not drive for 24 hours if you were given a sedative during your procedure.  Wear a medical alert bracelet in case of an emergency. This will tell any health care providers that you have a port.  Keep all follow-up visits as told by your health care provider. This is important. Contact a health care provider if:  You cannot flush your port with saline as directed, or you cannot draw blood from the port.  You have a fever or chills.  You have redness, swelling, or pain around your port insertion site.  You have fluid or blood coming from your port insertion site.  Your port insertion site feels warm to the touch.  You have pus or a bad smell coming from the port insertion site. Get help right away if:  You have chest pain or shortness of breath.  You have bleeding from your port that you cannot control. Summary  Take care of the port as told by your   health care provider. Keep the manufacturer's information card with you at all times.  Change your dressing as told by your health care provider.  Contact a health care provider if you have a fever or chills or if you have redness, swelling, or pain around your port insertion site.  Keep all follow-up visits as told by your health care provider. This information is not intended to replace advice given to you by your health care provider. Make sure you discuss any questions you have with your health care provider. Document Revised: 11/19/2017 Document Reviewed: 11/19/2017 Elsevier Patient Education   2021 Elsevier Inc.  

## 2020-06-24 NOTE — Telephone Encounter (Signed)
Scheduled appts per 2/17 los. Gave pt a printout of AVS.

## 2020-06-24 NOTE — Assessment & Plan Note (Signed)
She has lost a lot of weight but overall, I think she is responding well to treatment Continue aggressive supportive care I will set up virtual visit for symptom management next week I recommend minimum 3 cycles of treatment before repeating imaging study With her recent weight change, I will adjust the dose of her treatment accordingly

## 2020-06-24 NOTE — Progress Notes (Signed)
Per Dr. Alvy Bimler ok to treat patient with blood pressure elevation. Patient denies headache or chest pain at this time.

## 2020-06-24 NOTE — Assessment & Plan Note (Signed)
She continues to have frequent nausea and occasional vomiting but overall she is able to tolerate oral intake She will continue antiemetics as needed

## 2020-06-24 NOTE — Assessment & Plan Note (Signed)
We discussed the importance of aggressive laxative therapy 

## 2020-06-24 NOTE — Assessment & Plan Note (Signed)
Her pain is well controlled so far We discussed narcotic refill policy

## 2020-06-25 ENCOUNTER — Other Ambulatory Visit: Payer: Self-pay | Admitting: Hematology and Oncology

## 2020-06-25 DIAGNOSIS — C778 Secondary and unspecified malignant neoplasm of lymph nodes of multiple regions: Secondary | ICD-10-CM

## 2020-06-25 DIAGNOSIS — C7800 Secondary malignant neoplasm of unspecified lung: Secondary | ICD-10-CM

## 2020-06-25 DIAGNOSIS — C541 Malignant neoplasm of endometrium: Secondary | ICD-10-CM

## 2020-07-01 ENCOUNTER — Other Ambulatory Visit: Payer: Self-pay | Admitting: Hematology and Oncology

## 2020-07-01 ENCOUNTER — Other Ambulatory Visit: Payer: Self-pay

## 2020-07-01 ENCOUNTER — Inpatient Hospital Stay (HOSPITAL_BASED_OUTPATIENT_CLINIC_OR_DEPARTMENT_OTHER): Payer: 59 | Admitting: Hematology and Oncology

## 2020-07-01 ENCOUNTER — Encounter: Payer: Self-pay | Admitting: Hematology and Oncology

## 2020-07-01 DIAGNOSIS — I10 Essential (primary) hypertension: Secondary | ICD-10-CM | POA: Diagnosis not present

## 2020-07-01 DIAGNOSIS — G893 Neoplasm related pain (acute) (chronic): Secondary | ICD-10-CM | POA: Diagnosis not present

## 2020-07-01 DIAGNOSIS — R030 Elevated blood-pressure reading, without diagnosis of hypertension: Secondary | ICD-10-CM

## 2020-07-01 DIAGNOSIS — R112 Nausea with vomiting, unspecified: Secondary | ICD-10-CM | POA: Diagnosis not present

## 2020-07-01 DIAGNOSIS — C541 Malignant neoplasm of endometrium: Secondary | ICD-10-CM

## 2020-07-01 MED ORDER — AMLODIPINE BESYLATE 10 MG PO TABS: 10.0000 mg | ORAL_TABLET | Freq: Every day | ORAL | 1 refills | Status: DC

## 2020-07-01 NOTE — Assessment & Plan Note (Signed)
It appears that the patient is able to hydrate herself She will continue to use antiemetics

## 2020-07-01 NOTE — Progress Notes (Signed)
HEMATOLOGY-ONCOLOGY ELECTRONIC VISIT PROGRESS NOTE  Patient Care Team: Nicolette Bang, DO as PCP - General (Family Medicine) [name] [name], RN as Oncology Nurse Navigator (Oncology)  I connected with by telephone  ASSESSMENT & PLAN:  Endometrial carcinoma [facility]) She tolerated recent chemo well but has persistent nausea Continue aggressive supportive care Her next treatment is due on March 10  Essential hypertension She continues to have uncontrolled hypertension Her home monitoring of blood pressure documented systolic blood pressure between 180-190 I recommend additional treatment with amlodipine I advised the patient to continue close monitoring of blood pressure at home and I plan to set up another virtual visit next week to follow-up on her blood pressure control at home She is in agreement  Cancer associated pain She stated that her pain is well controlled She does not need medication refill today  Nausea with vomiting It appears that the patient is able to hydrate herself She will continue to use antiemetics   No orders of the defined types were placed in this encounter.   INTERVAL HISTORY: Please see below for problem oriented charting. The purpose of today's visit is to follow-up on blood pressure control at home, cancer associated pain, nausea and others She stated that she continues to have regular nausea and occasional vomiting She is taking all antiemetics as prescribed She has intermittent pain but generally her pain is well controlled She denies recent constipation Despite frequent nausea, she is able to hydrate herself Her documented blood pressure at home is high In general, her blood pressure at home is between 295 to 621 systolic She denies headache or blurriness of vision  SUMMARY OF ONCOLOGIC HISTORY: Oncology History Overview Note  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     02/26/2020 Imaging   1. 3 mm nonobstructing renal stone within the left kidney. 2. Findings suggestive of a small hemorrhagic cyst within the lower pole of the right kidney, with an anterior right lower pole heterogeneous renal soft tissue mass. MRI correlation is recommended, as an underlying neoplastic process cannot be excluded. 3. Colonic diverticulosis. 4. Multiple exophytic uterine fibroids. 5. Evidence of prior cholecystectomy.   03/28/2020 Imaging   2.4 cm enhancing mass in the anterior right lower kidney, suspicious for solid renal neoplasm such as renal cell carcinoma.   Single right renal artery and vein.  No renal vein invasion.   Small retroperitoneal nodes, measuring up to 9 mm short axis, mildly prominent but technically within the upper limits of normal. Attention on follow-up is suggested.   4 x 5 mm nodule in the medial right lower lobe. This is nonspecific and unlikely to reflect a metastasis but warrants attention on follow-up. Consider dedicated CT chest in 3-6 months.   3 mm nonobstructing left lower pole renal calculus. No hydronephrosis.     04/04/2020 Imaging   1. 10 mm endometrial stripe thickness. This is considered abnormal in a postmenopausal female with bleeding. In the setting of post-menopausal bleeding, endometrial sampling is indicated to exclude carcinoma.  2. Multiple uterine fibroids including potential submucosal fibroid in the lower uterine segment.   04/19/2020 Pathology Results   A. ENDOMETRIUM, BIOPSY:  -  High-grade carcinoma  Immunohistochemical and morphometric analysis performed manually   The tumor cells are POSITIVE for Her2 (3+).    04/19/2020 Procedure   She was seen by Dr. Astrid Drafts  on 04/19/2020.  At that visit her cervix was noted to be nodular and firm and abnormal appearing.  Pap taken at that visit was positive for adenocarcinoma, endometrial.   An endometrial [place] biopsy was taken at the same visit which revealed high-grade endometrial cancer, suspicious for serous carcinoma.     05/12/2020 Cancer Staging   Staging form: Corpus Uteri - Carcinoma and Carcinosarcoma, AJCC 8th Edition - Clinical stage from 05/12/2020: FIGO Stage IVB (cT3, cN2, cM1) - Signed by [name], MD on 05/31/2020   05/30/2020 PET scan   1. Hypermetabolic lymph nodes identified in the left supraclavicular region/thoracic inlet, mediastinum, hilar regions, right retrocrural space, para-aortic retroperitoneum, and bilateral pelvic sidewall, compatible with metastatic disease. 2. Moderate volume ascites on this noncontrast CT study largely obscures peritoneal and omental disease although hypermetabolic plaque-like uptake in the anterior high abdomen is probably peritoneal with relatively bulky hypermetabolism in the omentum. These findings are also compatible with metastatic disease. 3. Hypermetabolic left upper lobe pulmonary nodule consistent with metastatic disease. Lung primary is considered less likely but not excluded. 4. Scattered areas of focal marrow uptake are suspicious for bony metastases although no underlying abnormality is evident on CT imaging. 5. Diffuse uptake in the uterine anatomy compatible with the patient's known history of endometrial carcinoma. 6. As above, moderate volume ascites is new in the interval. 7. Enhancing lesion lower pole right kidney seen on previous CT of 03/28/2020 is less conspicuous on today's noncontrast CT imaging. Diffuse normal background FDG accumulation could obscure hypermetabolism in this lesion previously characterized as suspicious for renal cell carcinoma. 8. Bibasilar collapse/consolidation in the lower lobes.   06/01/2020 Procedure   Ultrasound and fluoroscopically guided right internal jugular single lumen power port catheter insertion. Tip in the SVC/RA junction. Catheter ready for use.   06/02/2020 Echocardiogram     1. GLS -18.2%.  2. Left ventricular ejection fraction, by estimation, is 60 to 65%. The left ventricle has normal function. The left ventricle has no regional wall motion abnormalities. There is mild left ventricular hypertrophy. Left ventricular diastolic parameters are consistent with Grade I diastolic dysfunction (impaired relaxation).  3. Right ventricular systolic function is normal. The right ventricular size is normal.  4. The mitral valve is normal in structure. No evidence of mitral valve regurgitation. No evidence of mitral stenosis.  5. The aortic valve is tricuspid. Aortic valve regurgitation is not visualized. No aortic stenosis is present.  6. The inferior vena cava is normal in size with greater than 50% respiratory variability, suggesting right atrial pressure of 3 mmHg.     06/03/2020 -  Chemotherapy    Patient is on Treatment Plan: UTERINE SEROUS CARCINOMA CARBOPLATIN + PACLITAXEL + TRASTUZUMAB Q21D X 6 CYCLES / TRASTUZUMAB Q21D      Metastasis to lymph nodes ([place])  05/31/2020 Initial Diagnosis   Metastasis to lymph nodes ([place])   06/03/2020 -  Chemotherapy    Patient is on Treatment Plan: UTERINE SEROUS CARCINOMA CARBOPLATIN + PACLITAXEL + TRASTUZUMAB Q21D X 6 CYCLES / TRASTUZUMAB Q21D      Metastasis to lung ([place])  05/31/2020 Initial Diagnosis   Metastasis to lung ([place])   06/03/2020 -  Chemotherapy    Patient is on Treatment Plan: UTERINE SEROUS CARCINOMA CARBOPLATIN + PACLITAXEL + TRASTUZUMAB Q21D X 6 CYCLES / TRASTUZUMAB Q21D        REVIEW OF SYSTEMS:   Constitutional: Denies fevers, chills or abnormal weight loss Eyes: Denies blurriness of vision Ears, nose, mouth, throat, and face:  Denies mucositis or sore throat Respiratory: Denies cough, dyspnea or wheezes Cardiovascular: Denies palpitation, chest discomfort Skin: Denies abnormal skin rashes Lymphatics: Denies new lymphadenopathy or easy bruising Neurological:Denies numbness, tingling or new  weaknesses Behavioral/Psych: Mood is stable, no new changes  Extremities: No lower extremity edema All other systems were reviewed with the patient and are negative.  I have reviewed the past medical history, past surgical history, social history and family history with the patient and they are unchanged from previous note.  ALLERGIES:  has No Known Allergies.  MEDICATIONS:  Current Outpatient Medications  Medication Sig Dispense Refill  . amLODipine (NORVASC) 10 MG tablet Take 1 tablet (10 mg total) by mouth daily. 30 tablet 1  . dexamethasone (DECADRON) 4 MG tablet Take 2 tabs at the night before and 2 tab the morning of chemotherapy, every 3 weeks, by mouth x 6 cycles 36 tablet 6  . lactulose (CHRONULAC) 10 GM/15ML solution TAKE 30 MILLILITERS BY MOUTH 3 TIMES DAILY 946 mL 1  . lidocaine-prilocaine (EMLA) cream Apply to affected area once 30 g 3  . losartan (COZAAR) 50 MG tablet Take 1 tablet (50 mg total) by mouth daily. 90 tablet 1  . ondansetron (ZOFRAN) 8 MG tablet Take 1 tablet (8 mg total) by mouth every 8 (eight) hours as needed for refractory nausea / vomiting. Start on day 3 after carboplatin chemo. 30 tablet 1  . oxyCODONE (OXY IR/ROXICODONE) 5 MG immediate release tablet Take 1 tablet (5 mg total) by mouth every 4 (four) hours as needed for severe pain. 60 tablet 0  . polyethylene glycol (MIRALAX / GLYCOLAX) 17 g packet Take 17 g by mouth daily.    . prochlorperazine (COMPAZINE) 10 MG tablet TAKE 1 TABLET(10 MG) BY MOUTH EVERY 6 HOURS AS NEEDED FOR NAUSEA OR VOMITING 30 tablet 1   No current facility-administered medications for this visit.    PHYSICAL EXAMINATION: ECOG PERFORMANCE STATUS: 1 - Symptomatic but completely ambulatory  LABORATORY DATA:  I have reviewed the data as listed CMP Latest Ref Rng & Units 06/24/2020 05/31/2020 05/11/2020  Glucose 70 - 99 mg/dL 158(H) 152(H) 108(H)  BUN 8 - 23 mg/dL 7(L) 13 12  Creatinine 0.44 - 1.00 mg/dL 0.82 0.99 0.89  Sodium 135 -  145 mmol/L 140 139 144  Potassium 3.5 - 5.1 mmol/L 3.1(L) 4.2 3.9  Chloride 98 - 111 mmol/L 103 101 109  CO2 22 - 32 mmol/L 26 25 23   Calcium 8.9 - 10.3 mg/dL 8.9 9.2 9.5  Total Protein 6.5 - 8.1 g/dL 7.2 7.5 7.4  Total Bilirubin 0.3 - 1.2 mg/dL 0.5 0.4 0.5  Alkaline Phos 38 - 126 U/L 89 79 70  AST 15 - 41 U/L 12(L) 10(L) 13(L)  ALT 0 - 44 U/L 7 7 11     Lab Results  Component Value Date   WBC 9.1 06/24/2020   HGB 10.0 (L) 06/24/2020   HCT 32.2 (L) 06/24/2020   MCV 71.1 (L) 06/24/2020   PLT 292 06/24/2020   NEUTROABS 7.9 (H) 06/24/2020     RADIOGRAPHIC STUDIES: I have personally reviewed the radiological images as listed and agreed with the findings in the report. ECHOCARDIOGRAM COMPLETE  Result Date: 06/02/2020    ECHOCARDIOGRAM REPORT   Patient Name:   TED LEONHART. Lozoya Date of Exam: 06/02/2020 Medical Rec #:  [id number]          Height:       61.0 in Accession #:    [id number]  Weight:       258.8 lb Date of Birth:  06-10-1955          BSA:          2.107 m Patient Age:    65 years           BP:           195/118 mmHg Patient Gender: F                  HR:           121 bpm. Exam Location:  Outpatient Procedure: 2D Echo, Color Doppler, Cardiac Doppler and Strain Analysis Indications:    Pre-chemo evaluation  History:        Patient has no prior history of Echocardiogram examinations.                 Risk Factors:Hypertension and Diabetes.  Sonographer:    Raquel Sarna Senior RDCS Referring Phys: [phone] Erandi Lemma [facility]  Sonographer Comments: Technically difficult due to body habitus. IMPRESSIONS  1. GLS -18.2%.  2. Left ventricular ejection fraction, by estimation, is 60 to 65%. The left ventricle has normal function. The left ventricle has no regional wall motion abnormalities. There is mild left ventricular hypertrophy. Left ventricular diastolic parameters are consistent with Grade I diastolic dysfunction (impaired relaxation).  3. Right ventricular systolic function is normal. The right  ventricular size is normal.  4. The mitral valve is normal in structure. No evidence of mitral valve regurgitation. No evidence of mitral stenosis.  5. The aortic valve is tricuspid. Aortic valve regurgitation is not visualized. No aortic stenosis is present.  6. The inferior vena cava is normal in size with greater than 50% respiratory variability, suggesting right atrial pressure of 3 mmHg. FINDINGS  Left Ventricle: Left ventricular ejection fraction, by estimation, is 60 to 65%. The left ventricle has normal function. The left ventricle has no regional wall motion abnormalities. The left ventricular internal cavity size was normal in size. There is  mild left ventricular hypertrophy. Left ventricular diastolic parameters are consistent with Grade I diastolic dysfunction (impaired relaxation). Right Ventricle: The right ventricular size is normal. Right ventricular systolic function is normal. Left Atrium: Left atrial size was normal in size. Right Atrium: Right atrial size was normal in size. Pericardium: Trivial pericardial effusion is present. Mitral Valve: The mitral valve is normal in structure. No evidence of mitral valve regurgitation. No evidence of mitral valve stenosis. Tricuspid Valve: The tricuspid valve is normal in structure. Tricuspid valve regurgitation is trivial. No evidence of tricuspid stenosis. Aortic Valve: The aortic valve is tricuspid. Aortic valve regurgitation is not visualized. No aortic stenosis is present. Pulmonic Valve: The pulmonic valve was not well visualized. Pulmonic valve regurgitation is not visualized. No evidence of pulmonic stenosis. Aorta: The aortic root is normal in size and structure. Venous: The inferior vena cava is normal in size with greater than 50% respiratory variability, suggesting right atrial pressure of 3 mmHg. . Additional Comments: GLS -18.2%.  LEFT VENTRICLE PLAX 2D LVIDd:         2.70 cm  Diastology LVIDs:         1.60 cm  LV e' medial:    5.22 cm/s LV  PW:         1.40 cm  LV E/e' medial:  11.6 LV IVS:        1.20 cm  LV e' lateral:   9.25 cm/s LVOT diam:     1.80 cm  LV E/e' lateral: 6.6 LV SV:         44 LV SV Index:   21 LVOT Area:     2.54 cm  RIGHT VENTRICLE RV S prime:     15.50 cm/s TAPSE (M-mode): 2.1 cm LEFT ATRIUM             Index       RIGHT ATRIUM           Index LA diam:        3.00 cm 1.42 cm/m  RA Area:     11.90 cm LA Vol (A2C):   31.2 ml 14.80 ml/m RA Volume:   22.80 ml  10.82 ml/m LA Vol (A4C):   39.6 ml 18.79 ml/m LA Biplane Vol: 36.0 ml 17.08 ml/m  AORTIC VALVE LVOT Vmax:   121.00 cm/s LVOT Vmean:  84.000 cm/s LVOT VTI:    0.171 m  AORTA Ao Root diam: 2.70 cm Ao Asc diam:  2.60 cm MITRAL VALVE MV Area (PHT): 2.84 cm     SHUNTS MV Decel Time: 267 msec     Systemic VTI:  0.17 m MV E velocity: 60.80 cm/s   Systemic Diam: 1.80 cm MV A velocity: 112.00 cm/s MV E/A ratio:  0.54 Kirk Ruths MD Electronically signed by Kirk Ruths MD Signature Date/Time: 06/02/2020/2:02:31 PM    Final     I discussed the assessment and treatment plan with the patient. The patient was provided an opportunity to ask questions and all were answered. The patient agreed with the plan and demonstrated an understanding of the instructions. The patient was advised to call back or seek an in-person evaluation if the symptoms worsen or if the condition fails to improve as anticipated.    I spent 20 minutes for the appointment reviewing test results, discuss management and coordination of care.  [name], MD 07/01/2020 12:39 PM

## 2020-07-01 NOTE — Assessment & Plan Note (Signed)
She continues to have uncontrolled hypertension Her home monitoring of blood pressure documented systolic blood pressure between 180-190 I recommend additional treatment with amlodipine I advised the patient to continue close monitoring of blood pressure at home and I plan to set up another virtual visit next week to follow-up on her blood pressure control at home She is in agreement

## 2020-07-01 NOTE — Assessment & Plan Note (Signed)
She stated that her pain is well controlled She does not need medication refill today

## 2020-07-01 NOTE — Assessment & Plan Note (Signed)
She tolerated recent chemo well but has persistent nausea Continue aggressive supportive care Her next treatment is due on March 10

## 2020-07-02 MED ORDER — CYANOCOBALAMIN 1000 MCG/ML IJ SOLN
INTRAMUSCULAR | Status: AC
  Filled 2020-07-02: qty 1

## 2020-07-08 ENCOUNTER — Other Ambulatory Visit: Payer: Self-pay | Admitting: Hematology and Oncology

## 2020-07-08 ENCOUNTER — Inpatient Hospital Stay: Payer: 59

## 2020-07-08 ENCOUNTER — Inpatient Hospital Stay: Payer: 59 | Attending: Gynecologic Oncology

## 2020-07-08 ENCOUNTER — Inpatient Hospital Stay (HOSPITAL_BASED_OUTPATIENT_CLINIC_OR_DEPARTMENT_OTHER): Payer: 59 | Admitting: Hematology and Oncology

## 2020-07-08 ENCOUNTER — Other Ambulatory Visit: Payer: Self-pay

## 2020-07-08 ENCOUNTER — Encounter: Payer: Self-pay | Admitting: Hematology and Oncology

## 2020-07-08 ENCOUNTER — Inpatient Hospital Stay: Payer: 59 | Admitting: Hematology and Oncology

## 2020-07-08 ENCOUNTER — Telehealth: Payer: Self-pay

## 2020-07-08 VITALS — BP 170/94 | HR 137 | Temp 98.1°F | Resp 20 | Ht 61.0 in | Wt 225.4 lb

## 2020-07-08 VITALS — BP 168/78 | HR 88 | Resp 18

## 2020-07-08 DIAGNOSIS — E876 Hypokalemia: Secondary | ICD-10-CM | POA: Diagnosis not present

## 2020-07-08 DIAGNOSIS — G893 Neoplasm related pain (acute) (chronic): Secondary | ICD-10-CM | POA: Insufficient documentation

## 2020-07-08 DIAGNOSIS — C786 Secondary malignant neoplasm of retroperitoneum and peritoneum: Secondary | ICD-10-CM | POA: Insufficient documentation

## 2020-07-08 DIAGNOSIS — I10 Essential (primary) hypertension: Secondary | ICD-10-CM

## 2020-07-08 DIAGNOSIS — C541 Malignant neoplasm of endometrium: Secondary | ICD-10-CM

## 2020-07-08 DIAGNOSIS — R630 Anorexia: Secondary | ICD-10-CM | POA: Diagnosis not present

## 2020-07-08 DIAGNOSIS — C778 Secondary and unspecified malignant neoplasm of lymph nodes of multiple regions: Secondary | ICD-10-CM | POA: Insufficient documentation

## 2020-07-08 DIAGNOSIS — G620 Drug-induced polyneuropathy: Secondary | ICD-10-CM | POA: Diagnosis not present

## 2020-07-08 DIAGNOSIS — E119 Type 2 diabetes mellitus without complications: Secondary | ICD-10-CM | POA: Diagnosis not present

## 2020-07-08 DIAGNOSIS — Z79899 Other long term (current) drug therapy: Secondary | ICD-10-CM | POA: Insufficient documentation

## 2020-07-08 DIAGNOSIS — Z5111 Encounter for antineoplastic chemotherapy: Secondary | ICD-10-CM | POA: Insufficient documentation

## 2020-07-08 DIAGNOSIS — K5900 Constipation, unspecified: Secondary | ICD-10-CM | POA: Diagnosis not present

## 2020-07-08 DIAGNOSIS — R634 Abnormal weight loss: Secondary | ICD-10-CM | POA: Insufficient documentation

## 2020-07-08 DIAGNOSIS — C7800 Secondary malignant neoplasm of unspecified lung: Secondary | ICD-10-CM

## 2020-07-08 DIAGNOSIS — C7802 Secondary malignant neoplasm of left lung: Secondary | ICD-10-CM | POA: Diagnosis not present

## 2020-07-08 DIAGNOSIS — D61810 Antineoplastic chemotherapy induced pancytopenia: Secondary | ICD-10-CM | POA: Diagnosis not present

## 2020-07-08 DIAGNOSIS — R112 Nausea with vomiting, unspecified: Secondary | ICD-10-CM

## 2020-07-08 DIAGNOSIS — Z79891 Long term (current) use of opiate analgesic: Secondary | ICD-10-CM | POA: Diagnosis not present

## 2020-07-08 DIAGNOSIS — D61818 Other pancytopenia: Secondary | ICD-10-CM

## 2020-07-08 LAB — CBC WITH DIFFERENTIAL/PLATELET
Abs Immature Granulocytes: 0.01 10*3/uL (ref 0.00–0.07)
Basophils Absolute: 0 10*3/uL (ref 0.0–0.1)
Basophils Relative: 0 %
Eosinophils Absolute: 0 10*3/uL (ref 0.0–0.5)
Eosinophils Relative: 0 %
HCT: 33.2 % — ABNORMAL LOW (ref 36.0–46.0)
Hemoglobin: 10.5 g/dL — ABNORMAL LOW (ref 12.0–15.0)
Immature Granulocytes: 0 %
Lymphocytes Relative: 43 %
Lymphs Abs: 1.2 10*3/uL (ref 0.7–4.0)
MCH: 22.2 pg — ABNORMAL LOW (ref 26.0–34.0)
MCHC: 31.6 g/dL (ref 30.0–36.0)
MCV: 70 fL — ABNORMAL LOW (ref 80.0–100.0)
Monocytes Absolute: 0.8 10*3/uL (ref 0.1–1.0)
Monocytes Relative: 29 %
Neutro Abs: 0.8 10*3/uL — ABNORMAL LOW (ref 1.7–7.7)
Neutrophils Relative %: 28 %
Platelets: 342 10*3/uL (ref 150–400)
RBC: 4.74 MIL/uL (ref 3.87–5.11)
RDW: 20.4 % — ABNORMAL HIGH (ref 11.5–15.5)
WBC: 2.9 10*3/uL — ABNORMAL LOW (ref 4.0–10.5)
nRBC: 0 % (ref 0.0–0.2)

## 2020-07-08 LAB — COMPREHENSIVE METABOLIC PANEL
ALT: 15 U/L (ref 0–44)
AST: 14 U/L — ABNORMAL LOW (ref 15–41)
Albumin: 3.4 g/dL — ABNORMAL LOW (ref 3.5–5.0)
Alkaline Phosphatase: 89 U/L (ref 38–126)
Anion gap: 16 — ABNORMAL HIGH (ref 5–15)
BUN: 10 mg/dL (ref 8–23)
CO2: 28 mmol/L (ref 22–32)
Calcium: 9.1 mg/dL (ref 8.9–10.3)
Chloride: 97 mmol/L — ABNORMAL LOW (ref 98–111)
Creatinine, Ser: 0.99 mg/dL (ref 0.44–1.00)
GFR, Estimated: >60 mL/min (ref >60)
Glucose, Bld: 144 mg/dL — ABNORMAL HIGH (ref 70–99)
Potassium: 2.7 mmol/L — CL (ref 3.5–5.1)
Sodium: 141 mmol/L (ref 135–145)
Total Bilirubin: 0.4 mg/dL (ref 0.3–1.2)
Total Protein: 7.6 g/dL (ref 6.5–8.1)

## 2020-07-08 LAB — MAGNESIUM: Magnesium: 1.3 mg/dL — ABNORMAL LOW (ref 1.7–2.4)

## 2020-07-08 MED ORDER — ONDANSETRON HCL 4 MG/2ML IJ SOLN
8.0000 mg | Freq: Once | INTRAMUSCULAR | Status: AC
  Administered 2020-07-08: 8 mg via INTRAVENOUS

## 2020-07-08 MED ORDER — SODIUM CHLORIDE 0.9 % IV SOLN: Freq: Once | INTRAVENOUS | Status: DC

## 2020-07-08 MED ORDER — ALTEPLASE 2 MG IJ SOLR
2.0000 mg | Freq: Once | INTRAMUSCULAR | Status: DC | PRN
  Filled 2020-07-08: qty 2

## 2020-07-08 MED ORDER — HEPARIN SOD (PORK) LOCK FLUSH 100 UNIT/ML IV SOLN
500.0000 [IU] | Freq: Once | INTRAVENOUS | Status: AC | PRN
  Administered 2020-07-08: 500 [IU]
  Filled 2020-07-08: qty 5

## 2020-07-08 MED ORDER — SODIUM CHLORIDE 0.9 % IV SOLN
10.0000 mg | Freq: Once | INTRAVENOUS | Status: AC
  Administered 2020-07-08: 10 mg via INTRAVENOUS
  Filled 2020-07-08: qty 10

## 2020-07-08 MED ORDER — ONDANSETRON HCL 4 MG/2ML IJ SOLN
INTRAMUSCULAR | Status: AC
  Filled 2020-07-08: qty 4

## 2020-07-08 MED ORDER — SODIUM CHLORIDE 0.9% FLUSH
3.0000 mL | Freq: Once | INTRAVENOUS | Status: AC | PRN
  Filled 2020-07-08: qty 10

## 2020-07-08 MED ORDER — SODIUM CHLORIDE 0.9 % IV SOLN
Freq: Once | INTRAVENOUS | Status: AC
  Filled 2020-07-08: qty 250

## 2020-07-08 MED ORDER — SODIUM CHLORIDE 0.9% FLUSH
10.0000 mL | Freq: Once | INTRAVENOUS | Status: AC
  Administered 2020-07-08: 10 mL
  Filled 2020-07-08: qty 10

## 2020-07-08 MED ORDER — SODIUM CHLORIDE 0.9% FLUSH
10.0000 mL | Freq: Once | INTRAVENOUS | Status: AC | PRN
  Administered 2020-07-08: 10 mL
  Filled 2020-07-08: qty 10

## 2020-07-08 MED ORDER — MAGNESIUM SULFATE 2 GM/50ML IV SOLN
INTRAVENOUS | Status: AC
  Filled 2020-07-08: qty 50

## 2020-07-08 MED ORDER — HEPARIN SOD (PORK) LOCK FLUSH 100 UNIT/ML IV SOLN
250.0000 [IU] | Freq: Once | INTRAVENOUS | Status: AC | PRN
  Filled 2020-07-08: qty 5

## 2020-07-08 MED ORDER — MAGNESIUM SULFATE 4 GM/100ML IV SOLN
4.0000 g | Freq: Once | INTRAVENOUS | Status: AC
  Administered 2020-07-08: 4 g via INTRAVENOUS
  Filled 2020-07-08: qty 100

## 2020-07-08 NOTE — Assessment & Plan Note (Signed)
Unfortunately, we ran out of time for potassium replacement We will start with IV magnesium first We will try to control her symptoms with IV fluids and dexamethasone I am hopeful we can work her in again next week for further blood count check, IV fluid support and electrolyte replacement therapy The patient has declined admission to the hospital

## 2020-07-08 NOTE — Telephone Encounter (Signed)
Called per Dr. Alvy Bimler and scheduled appt for lab, flush Dr. Alvy Bimler and IV fluids. The earliest she can come in is at 1:30 pm. Appts scheduled and she is aware of time for appts.

## 2020-07-08 NOTE — Progress Notes (Signed)
CRITICAL VALUE STICKER  CRITICAL VALUE: Potassium 2.7  RECEIVER (on-site recipient of call): Harrel Lemon, RN  [place] NOTIFIED: [phone] 07/08/20  MESSENGER (representative from lab): M. Nicole Kindred.  MD NOTIFIED: Dr. Alvy Bimler  TIME OF NOTIFICATION: 07/08/20 ar 1441  RESPONSE: Will give IV magnesium

## 2020-07-08 NOTE — Assessment & Plan Note (Signed)
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

## 2020-07-08 NOTE — Patient Instructions (Signed)
Rehydration, Adult Rehydration is the replacement of body fluids, salts, and minerals (electrolytes) that are lost during dehydration. Dehydration is when there is not enough water or other fluids in the body. This happens when you lose more fluids than you take in. Common causes of dehydration include:  Not drinking enough fluids. This can occur when you are ill or doing activities that require a lot of energy, especially in hot weather.  Conditions that cause loss of water or other fluids, such as diarrhea, vomiting, sweating, or urinating a lot.  Other illnesses, such as fever or infection.  Certain medicines, such as those that remove excess fluid from the body (diuretics). Symptoms of mild or moderate dehydration may include thirst, dry lips and mouth, and dizziness. Symptoms of severe dehydration may include increased heart rate, confusion, fainting, and not urinating. For severe dehydration, you may need to get fluids through an IV at the hospital. For mild or moderate dehydration, you can usually rehydrate at home by drinking certain fluids as told by your health care provider. What are the risks? Generally, rehydration is safe. However, taking in too much fluid (overhydration) can be a problem. This is rare. Overhydration can cause an electrolyte imbalance, kidney failure, or a decrease in salt (sodium) levels in the body. Supplies needed You will need an oral rehydration solution (ORS) if your health care provider tells you to use one. This is a drink to treat dehydration. It can be found in pharmacies and retail stores. How to rehydrate Fluids Follow instructions from your health care provider for rehydration. The kind of fluid and the amount you should drink depend on your condition. In general, you should choose drinks that you prefer.  If told by your health care provider, drink an ORS. ? Make an ORS by following instructions on the package. ? Start by drinking small amounts,  about  cup (120 mL) every 5-10 minutes. ? Slowly increase how much you drink until you have taken the amount recommended by your health care provider.  Drink enough clear fluids to keep your urine pale yellow. If you were told to drink an ORS, finish it first, then start slowly drinking other clear fluids. Drink fluids such as: ? Water. This includes sparkling water and flavored water. Drinking only water can lead to having too little sodium in your body (hyponatremia). Follow the advice of your health care provider. ? Water from ice chips you suck on. ? Fruit juice with water you add to it (diluted). ? Sports drinks. ? Hot or cold herbal teas. ? Broth-based soups. ? Milk or milk products. Food Follow instructions from your health care provider about what to eat while you rehydrate. Your health care provider may recommend that you slowly begin eating regular foods in small amounts.  Eat foods that contain a healthy balance of electrolytes, such as bananas, oranges, potatoes, tomatoes, and spinach.  Avoid foods that are greasy or contain a lot of sugar. In some cases, you may get nutrition through a feeding tube that is passed through your nose and into your stomach (nasogastric tube, or NG tube). This may be done if you have uncontrolled vomiting or diarrhea.   Beverages to avoid Certain beverages may make dehydration worse. While you rehydrate, avoid drinking alcohol.   How to tell if you are recovering from dehydration You may be recovering from dehydration if:  You are urinating more often than before you started rehydrating.  Your urine is pale yellow.  Your energy level   improves.  You vomit less frequently.  You have diarrhea less frequently.  Your appetite improves or returns to normal.  You feel less dizzy or less light-headed.  Your skin tone and color start to look more normal. Follow these instructions at home:  Take over-the-counter and prescription medicines only  as told by your health care provider.  Do not take sodium tablets. Doing this can lead to having too much sodium in your body (hypernatremia). Contact a health care provider if:  You continue to have symptoms of mild or moderate dehydration, such as: ? Thirst. ? Dry lips. ? Slightly dry mouth. ? Dizziness. ? Dark urine or less urine than normal. ? Muscle cramps.  You continue to vomit or have diarrhea. Get help right away if you:  Have symptoms of dehydration that get worse.  Have a fever.  Have a severe headache.  Have been vomiting and the following happens: ? Your vomiting gets worse or does not go away. ? Your vomit includes blood or green matter (bile). ? You cannot eat or drink without vomiting.  Have problems with urination or bowel movements, such as: ? Diarrhea that gets worse or does not go away. ? Blood in your stool (feces). This may cause stool to look black and tarry. ? Not urinating, or urinating only a small amount of very dark urine, within 6-8 hours.  Have trouble breathing.  Have symptoms that get worse with treatment. These symptoms may represent a serious problem that is an emergency. Do not wait to see if the symptoms will go away. Get medical help right away. Call your local emergency services (911 in the [place] Do not drive yourself to the hospital. Summary  Rehydration is the replacement of body fluids and minerals (electrolytes) that are lost during dehydration.  Follow instructions from your health care provider for rehydration. The kind of fluid and amount you should drink depend on your condition.  Slowly increase how much you drink until you have taken the amount recommended by your health care provider.  Contact your health care provider if you continue to show signs of mild or moderate dehydration. This information is not intended to replace advice given to you by your health care provider. Make sure you discuss any questions you have with  your health care provider. Document Revised: 06/24/2019 Document Reviewed: 05/04/2019 Elsevier Patient Education  2021 Else[employer] Inc.   Hypomagnesemia Hypomagnesemia is a condition in which the level of magnesium in the blood is low. Magnesium is a mineral that is found in many foods. It is used in many different processes in the body. Hypomagnesemia can affect every organ in the body. In severe cases, it can cause life-threatening problems. What are the causes? This condition may be caused by:  Not getting enough magnesium in your diet.  Malnutrition.  Problems with absorbing magnesium from the intestines.  Dehydration.  Alcohol abuse.  Vomiting.  Severe or chronic diarrhea.  Some medicines, including medicines that make you urinate more (diuretics).  Certain diseases, such as kidney disease, diabetes, celiac disease, and overactive thyroid. What are the signs or symptoms? Symptoms of this condition include:  Loss of appetite.  Nausea and vomiting.  Involuntary shaking or trembling of a body part (tremor).  Muscle weakness.  Tingling in the arms and legs.  Sudden tightening of muscles (muscle spasms).  Confusion.  Psychiatric issues, such as depression, irritability, or psychosis.  A feeling of fluttering of the heart.  Seizures. These symptoms are more severe   if magnesium levels drop suddenly. How is this diagnosed? This condition may be diagnosed based on:  Your symptoms and medical history.  A physical exam.  Blood and urine tests. How is this treated? Treatment depends on the cause and the severity of the condition. It may be treated with:  A magnesium supplement. This can be taken in pill form. If the condition is severe, magnesium is usually given through an IV.  Changes to your diet. You may be directed to eat foods that have a lot of magnesium, such as green leafy vegetables, peas, beans, and nuts.  Stopping any intake of alcohol.   Follow  these instructions at home:  Make sure that your diet includes foods with magnesium. Foods that have a lot of magnesium in them include: ? Green leafy vegetables, such as spinach and broccoli. ? Beans and peas. ? Nuts and seeds, such as almonds and sunflower seeds. ? Whole grains, such as whole grain bread and fortified cereals.  Take magnesium supplements if your health care provider tells you to do that. Take them as directed.  Take over-the-counter and prescription medicines only as told by your health care provider.  Have your magnesium levels monitored as told by your health care provider.  When you are active, drink fluids that contain electrolytes.  Avoid drinking alcohol.  Keep all follow-up visits as told by your health care provider. This is important.      Contact a health care provider if:  You get worse instead of better.  Your symptoms return. Get help right away if you:  Develop severe muscle weakness.  Have trouble breathing.  Feel that your heart is racing. Summary  Hypomagnesemia is a condition in which the level of magnesium in the blood is low.  Hypomagnesemia can affect every organ in the body.  Treatment may include eating more foods that contain magnesium, taking magnesium supplements, and not drinking alcohol.  Have your magnesium levels monitored as told by your health care provider. This information is not intended to replace advice given to you by your health care provider. Make sure you discuss any questions you have with your health care provider. Document Revised: 09/24/2019 Document Reviewed: 09/24/2019 Elsevier Patient Education  2021 Elsevier Inc.  

## 2020-07-08 NOTE — Patient Instructions (Signed)
Implanted Port Insertion, Care After This sheet gives you information about how to care for yourself after your procedure. Your health care provider may also give you more specific instructions. If you have problems or questions, contact your health care provider. What can I expect after the procedure? After the procedure, it is common to have:  Discomfort at the port insertion site.  Bruising on the skin over the port. This should improve over 3-4 days. Follow these instructions at home: Port care  After your port is placed, you will get a manufacturer's information card. The card has information about your port. Keep this card with you at all times.  Take care of the port as told by your health care provider. Ask your health care provider if you or a family member can get training for taking care of the port at home. A home health care nurse may also take care of the port.  Make sure to remember what type of port you have. Incision care  Follow instructions from your health care provider about how to take care of your port insertion site. Make sure you: ? Wash your hands with soap and water before and after you change your bandage (dressing). If soap and water are not available, use hand sanitizer. ? Change your dressing as told by your health care provider. ? Leave stitches (sutures), skin glue, or adhesive strips in place. These skin closures may need to stay in place for 2 weeks or longer. If adhesive strip edges start to loosen and curl up, you may trim the loose edges. Do not remove adhesive strips completely unless your health care provider tells you to do that.  Check your port insertion site every day for signs of infection. Check for: ? Redness, swelling, or pain. ? Fluid or blood. ? Warmth. ? Pus or a bad smell.      Activity  Return to your normal activities as told by your health care provider. Ask your health care provider what activities are safe for you.  Do not  lift anything that is heavier than 10 lb (4.5 kg), or the limit that you are told, until your health care provider says that it is safe. General instructions  Take over-the-counter and prescription medicines only as told by your health care provider.  Do not take baths, swim, or use a hot tub until your health care provider approves. Ask your health care provider if you may take showers. You may only be allowed to take sponge baths.  Do not drive for 24 hours if you were given a sedative during your procedure.  Wear a medical alert bracelet in case of an emergency. This will tell any health care providers that you have a port.  Keep all follow-up visits as told by your health care provider. This is important. Contact a health care provider if:  You cannot flush your port with saline as directed, or you cannot draw blood from the port.  You have a fever or chills.  You have redness, swelling, or pain around your port insertion site.  You have fluid or blood coming from your port insertion site.  Your port insertion site feels warm to the touch.  You have pus or a bad smell coming from the port insertion site. Get help right away if:  You have chest pain or shortness of breath.  You have bleeding from your port that you cannot control. Summary  Take care of the port as told by your   health care provider. Keep the manufacturer's information card with you at all times.  Change your dressing as told by your health care provider.  Contact a health care provider if you have a fever or chills or if you have redness, swelling, or pain around your port insertion site.  Keep all follow-up visits as told by your health care provider. This information is not intended to replace advice given to you by your health care provider. Make sure you discuss any questions you have with your health care provider. Document Revised: 11/19/2017 Document Reviewed: 11/19/2017 Elsevier Patient Education   2021 Elsevier Inc.  

## 2020-07-08 NOTE — Progress Notes (Signed)
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[place]) Overall, the patient felt poorly She has lost a lot of weight due to nausea vomiting and inability to eat Surprisingly, her blood counts are not too bad and she has no signs of renal failure We will continue supportive care with IV fluids and IV magnesium today I recommend daily dexamethasone over the weekend and we will call her next week for further follow-up She will need dose adjustment in the future based on profound weight loss  Pancytopenia, acquired ([place]) She has severe pancytopenia due to treatment She does not need transfusion support or antibiotics  Nausea with vomiting She has severe nausea with vomiting 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 She will receive IV dexamethasone and IV antiemetics today along with IV fluid support She will continue antiemetics at home I recommend daily dexamethasone over the next few days to see if we can help with her symptoms  Essential hypertension She has significant hypertension likely exacerbated by anxiety I do not plan to change her medications  Hypomagnesemia We will prescribe IV magnesium today   Hypokalemia, inadequate intake Unfortunately, we ran out of time for potassium replacement We will start with IV magnesium first We will try to control her symptoms with IV fluids and dexamethasone I am hopeful we can work her in again next week for further blood count check, IV fluid support and electrolyte replacement therapy The patient has declined admission to the hospital   Orders Placed This Encounter  Procedures  . SCHEDULING COMMUNICATION    Please schedule 3 hour infusion visit for fluids and hydration    All questions were answered. The patient knows to call the clinic with any  problems, questions or concerns. The total time spent in the appointment was 40 minutes encounter with patients including review of chart and various tests results, discussions about plan of care and coordination of care plan   [name], MD 07/08/2020 3:14 PM  INTERVAL HISTORY: Please see below for problem oriented charting. Our original appointment today is virtual visit in the morning.  However, after I spoke with her, knowing that she is not feeling well, the patient was brought back to the cancer center for evaluation I saw her again this afternoon We have blood work done She has not been feeling good over the past week She has profound nausea vomiting and inability to tolerate oral intake Yesterday, she drink half a bottle of water for the whole day She is disturbed by the fact that she is not eating or unable to eat without vomiting after She denies constipation Her pain is under control She denies recent vaginal bleeding No recent fever or chills The patient has declined admission to the hospital  SUMMARY OF ONCOLOGIC HISTORY: Oncology History Overview Note  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     02/26/2020 Imaging   1. 3 mm nonobstructing renal stone within the left kidney. 2. Findings suggestive of a small hemorrhagic cyst within the lower pole of the right kidney, with an anterior right lower pole heterogeneous renal soft tissue mass. MRI correlation is recommended, as an underlying neoplastic process cannot be excluded. 3. Colonic diverticulosis. 4. Multiple exophytic  uterine fibroids. 5. Evidence of prior cholecystectomy.   03/28/2020 Imaging   2.4 cm enhancing mass in the anterior right lower kidney, suspicious for solid renal neoplasm such as renal cell carcinoma.   Single right renal artery and vein.  No renal vein  invasion.   Small retroperitoneal nodes, measuring up to 9 mm short axis, mildly prominent but technically within the upper limits of normal. Attention on follow-up is suggested.   4 x 5 mm nodule in the medial right lower lobe. This is nonspecific and unlikely to reflect a metastasis but warrants attention on follow-up. Consider dedicated CT chest in 3-6 months.   3 mm nonobstructing left lower pole renal calculus. No hydronephrosis.     04/04/2020 Imaging   1. 10 mm endometrial stripe thickness. This is considered abnormal in a postmenopausal female with bleeding. In the setting of post-menopausal bleeding, endometrial sampling is indicated to exclude carcinoma.  2. Multiple uterine fibroids including potential submucosal fibroid in the lower uterine segment.   04/19/2020 Pathology Results   A. ENDOMETRIUM, BIOPSY:  -  High-grade carcinoma  Immunohistochemical and morphometric analysis performed manually   The tumor cells are POSITIVE for Her2 (3+).    04/19/2020 Procedure   She was seen by Dr. Astrid Drafts on 04/19/2020.  At that visit her cervix was noted to be nodular and firm and abnormal appearing.  Pap taken at that visit was positive for adenocarcinoma, endometrial.  An endometrial Pipelle biopsy was taken at the same visit which revealed high-grade endometrial cancer, suspicious for serous carcinoma.     05/12/2020 Cancer Staging   Staging form: Corpus Uteri - Carcinoma and Carcinosarcoma, AJCC 8th Edition - Clinical stage from 05/12/2020: FIGO Stage IVB (cT3, cN2, cM1) - Signed by [name], MD on 05/31/2020   05/30/2020 PET scan   1. Hypermetabolic lymph nodes identified in the left supraclavicular region/thoracic inlet, mediastinum, hilar regions, right retrocrural space, para-aortic retroperitoneum, and bilateral pelvic sidewall, compatible with metastatic disease. 2. Moderate volume ascites on this noncontrast CT study largely obscures peritoneal and omental disease although  hypermetabolic plaque-like uptake in the anterior high abdomen is probably peritoneal with relatively bulky hypermetabolism in the omentum. These findings are also compatible with metastatic disease. 3. Hypermetabolic left upper lobe pulmonary nodule consistent with metastatic disease. Lung primary is considered less likely but not excluded. 4. Scattered areas of focal marrow uptake are suspicious for bony metastases although no underlying abnormality is evident on CT imaging. 5. Diffuse uptake in the uterine anatomy compatible with the patient's known history of endometrial carcinoma. 6. As above, moderate volume ascites is new in the interval. 7. Enhancing lesion lower pole right kidney seen on previous CT of 03/28/2020 is less conspicuous on today's noncontrast CT imaging. Diffuse normal background FDG accumulation could obscure hypermetabolism in this lesion previously characterized as suspicious for renal cell carcinoma. 8. Bibasilar collapse/consolidation in the lower lobes.   06/01/2020 Procedure   Ultrasound and fluoroscopically guided right internal jugular single lumen power port catheter insertion. Tip in the SVC/RA junction. Catheter ready for use.   06/02/2020 Echocardiogram    1. GLS -18.2%.  2. Left ventricular ejection fraction, by estimation, is 60 to 65%. The left ventricle has normal function. The left ventricle has no regional wall motion abnormalities. There is mild left ventricular hypertrophy. Left ventricular diastolic parameters are consistent with Grade I diastolic dysfunction (impaired relaxation).  3. Right ventricular systolic function is normal. The right ventricular size is normal.  4. The mitral valve is  normal in structure. No evidence of mitral valve regurgitation. No evidence of mitral stenosis.  5. The aortic valve is tricuspid. Aortic valve regurgitation is not visualized. No aortic stenosis is present.  6. The inferior vena cava is normal in size with greater than  50% respiratory variability, suggesting right atrial pressure of 3 mmHg.     06/03/2020 -  Chemotherapy    Patient is on Treatment Plan: UTERINE SEROUS CARCINOMA CARBOPLATIN + PACLITAXEL + TRASTUZUMAB Q21D X 6 CYCLES / TRASTUZUMAB Q21D      Metastasis to lymph nodes ([place])  05/31/2020 Initial Diagnosis   Metastasis to lymph nodes ([place])   06/03/2020 -  Chemotherapy    Patient is on Treatment Plan: UTERINE SEROUS CARCINOMA CARBOPLATIN + PACLITAXEL + TRASTUZUMAB Q21D X 6 CYCLES / TRASTUZUMAB Q21D      Metastasis to lung ([place])  05/31/2020 Initial Diagnosis   Metastasis to lung ([place])   06/03/2020 -  Chemotherapy    Patient is on Treatment Plan: UTERINE SEROUS CARCINOMA CARBOPLATIN + PACLITAXEL + TRASTUZUMAB Q21D X 6 CYCLES / TRASTUZUMAB Q21D        REVIEW OF SYSTEMS:   Constitutional: Denies fevers, chills Eyes: Denies blurriness of vision Ears, nose, mouth, throat, and face: Denies mucositis or sore throat Respiratory: Denies cough, dyspnea or wheezes Cardiovascular: Denies palpitation, chest discomfort or lower extremity swelling Skin: Denies abnormal skin rashes Lymphatics: Denies new lymphadenopathy or easy bruising Behavioral/Psych: Mood is stable, no new changes  All other systems were reviewed with the patient and are negative.  I have reviewed the past medical history, past surgical history, social history and family history with the patient and they are unchanged from previous note.  ALLERGIES:  has No Known Allergies.  MEDICATIONS:  Current Outpatient Medications  Medication Sig Dispense Refill  . amLODipine (NORVASC) 10 MG tablet Take 1 tablet (10 mg total) by mouth daily. 30 tablet 1  . dexamethasone (DECADRON) 4 MG tablet Take 2 tabs at the night before and 2 tab the morning of chemotherapy, every 3 weeks, by mouth x 6 cycles 36 tablet 6  . lactulose (CHRONULAC) 10 GM/15ML solution TAKE 30 MILLILITERS BY MOUTH 3 TIMES DAILY 946 mL 1  . lidocaine-prilocaine (EMLA)  cream Apply to affected area once 30 g 3  . losartan (COZAAR) 50 MG tablet Take 1 tablet (50 mg total) by mouth daily. 90 tablet 1  . ondansetron (ZOFRAN) 8 MG tablet Take 1 tablet (8 mg total) by mouth every 8 (eight) hours as needed for refractory nausea / vomiting. Start on day 3 after carboplatin chemo. 30 tablet 1  . oxyCODONE (OXY IR/ROXICODONE) 5 MG immediate release tablet Take 1 tablet (5 mg total) by mouth every 4 (four) hours as needed for severe pain. 60 tablet 0  . polyethylene glycol (MIRALAX / GLYCOLAX) 17 g packet Take 17 g by mouth daily.    . prochlorperazine (COMPAZINE) 10 MG tablet TAKE 1 TABLET(10 MG) BY MOUTH EVERY 6 HOURS AS NEEDED FOR NAUSEA OR VOMITING 30 tablet 1   Current Facility-Administered Medications  Medication Dose Route Frequency Provider Last Rate Last Admin  . heparin lock flush 100 unit/mL  500 Units Intracatheter Once PRN Alvy Bimler, Safiyyah Vasconez, MD      . magnesium sulfate IVPB 4 g 100 mL  4 g Intravenous Once Alvy Bimler, Davia Smyre, MD 50 mL/hr at 07/08/20 1508 4 g at 07/08/20 1508  . sodium chloride flush (NS) 0.9 % injection 10 mL  10 mL Intracatheter Once PRN [name], MD  Facility-Administered Medications Ordered in Other Visits  Medication Dose Route Frequency Provider Last Rate Last Admin  . alteplase (CATHFLO ACTIVASE) injection 2 mg  2 mg Intracatheter Once PRN Alvy Bimler, Breydon Senters, MD      . dexamethasone (DECADRON) 10 mg in sodium chloride 0.9 % 50 mL IVPB  10 mg Intravenous Once Ricarda Atayde, MD      . heparin lock flush 100 unit/mL  250 Units Intracatheter Once PRN Alvy Bimler, Srihan Brutus, MD      . sodium chloride flush (NS) 0.9 % injection 3 mL  3 mL Intracatheter Once PRN Alvy Bimler, Curran Lenderman, MD        PHYSICAL EXAMINATION: ECOG PERFORMANCE STATUS: 2 - Symptomatic, <50% confined to bed  Vitals:   07/08/20 1413  BP: (!) 170/94  Pulse: (!) 137  Resp: 20  Temp: 98.1 F (36.7 C)  SpO2: 100%   Filed Weights   07/08/20 1413  Weight: 225 lb 6.4 oz (102.2 kg)     GENERAL:alert, no distress and comfortable.  She is ill-appearing SKIN: skin color, texture, turgor are normal, no rashes or significant lesions EYES: normal, Conjunctiva are pink and non-injected, sclera clear OROPHARYNX:no exudate, no erythema and lips, buccal mucosa, and tongue normal  NECK: supple, thyroid normal size, non-tender, without nodularity LYMPH:  no palpable lymphadenopathy in the cervical, axillary or inguinal LUNGS: clear to auscultation and percussion with normal breathing effort HEART: regular rate & rhythm and no murmurs and no lower extremity edema ABDOMEN:abdomen soft, non-tender and normal bowel sounds Musculoskeletal:no cyanosis of digits and no clubbing  NEURO: alert & oriented x 3 with fluent speech, no focal motor/sensory deficits  LABORATORY DATA:  I have reviewed the data as listed    Component Value Date/Time   NA 141 07/08/2020 1401   NA 138 02/23/2020 1113   K 2.7 (LL) 07/08/2020 1401   CL 97 (L) 07/08/2020 1401   CO2 28 07/08/2020 1401   GLUCOSE 144 (H) 07/08/2020 1401   BUN 10 07/08/2020 1401   BUN 15 02/23/2020 1113   CREATININE 0.99 07/08/2020 1401   CALCIUM 9.1 07/08/2020 1401   PROT 7.6 07/08/2020 1401   PROT 7.8 02/23/2020 1113   ALBUMIN 3.4 (L) 07/08/2020 1401   ALBUMIN 4.4 02/23/2020 1113   AST 14 (L) 07/08/2020 1401   ALT 15 07/08/2020 1401   ALKPHOS 89 07/08/2020 1401   BILITOT 0.4 07/08/2020 1401   BILITOT 0.6 02/23/2020 1113   GFRNONAA >60 07/08/2020 1401   GFRAA 91 02/23/2020 1113    No results found for: SPEP, UPEP  Lab Results  Component Value Date   WBC 2.9 (L) 07/08/2020   NEUTROABS 0.8 (L) 07/08/2020   HGB 10.5 (L) 07/08/2020   HCT 33.2 (L) 07/08/2020   MCV 70.0 (L) 07/08/2020   PLT 342 07/08/2020      Chemistry      Component Value Date/Time   NA 141 07/08/2020 1401   NA 138 02/23/2020 1113   K 2.7 (LL) 07/08/2020 1401   CL 97 (L) 07/08/2020 1401   CO2 28 07/08/2020 1401   BUN 10 07/08/2020 1401    BUN 15 02/23/2020 1113   CREATININE 0.99 07/08/2020 1401      Component Value Date/Time   CALCIUM 9.1 07/08/2020 1401   ALKPHOS 89 07/08/2020 1401   AST 14 (L) 07/08/2020 1401   ALT 15 07/08/2020 1401   BILITOT 0.4 07/08/2020 1401   BILITOT 0.6 02/23/2020 1113

## 2020-07-08 NOTE — Assessment & Plan Note (Signed)
Overall, the patient felt poorly She has lost a lot of weight due to nausea vomiting and inability to eat Surprisingly, her blood counts are not too bad and she has no signs of renal failure We will continue supportive care with IV fluids and IV magnesium today I recommend daily dexamethasone over the weekend and we will call her next week for further follow-up She will need dose adjustment in the future based on profound weight loss

## 2020-07-08 NOTE — Assessment & Plan Note (Signed)
We will prescribe IV magnesium today

## 2020-07-08 NOTE — Assessment & Plan Note (Signed)
She has severe pancytopenia due to treatment She does not need transfusion support or antibiotics

## 2020-07-08 NOTE — Patient Instructions (Signed)
Dehydration, Adult Dehydration is a condition in which there is not enough water or other fluids in the body. This happens when a person loses more fluids than he or she takes in. Important organs, such as the kidneys, brain, and heart, cannot function without a proper amount of fluids. Any loss of fluids from the body can lead to dehydration. Dehydration can be mild, moderate, or severe. It should be treated right away to prevent it from becoming severe. What are the causes? Dehydration may be caused by:  Conditions that cause loss of water or other fluids, such as diarrhea, vomiting, or sweating or urinating a lot.  Not drinking enough fluids, especially when you are ill or doing activities that require a lot of energy.  Other illnesses and conditions, such as fever or infection.  Certain medicines, such as medicines that remove excess fluid from the body (diuretics).  Lack of safe drinking water.  Not being able to get enough water and food. What increases the risk? The following factors may make you more likely to develop this condition:  Having a long-term (chronic) illness that has not been treated properly, such as diabetes, heart disease, or kidney disease.  Being 65 years of age or older.  Having a disability.  Living in a place that is high in altitude, where thinner, drier air causes more fluid loss.  Doing exercises that put stress on your body for a long time (endurance sports). What are the signs or symptoms? Symptoms of dehydration depend on how severe it is. Mild or moderate dehydration  Thirst.  Dry lips or dry mouth.  Dizziness or light-headedness, especially when standing up from a seated position.  Muscle cramps.  Dark urine. Urine may be the color of tea.  Less urine or tears produced than usual.  Headache. Severe dehydration  Changes in skin. Your skin may be cold and clammy, blotchy, or pale. Your skin also may not return to normal after being  lightly pinched and released.  Little or no tears, urine, or sweat.  Changes in vital signs, such as rapid breathing and low blood pressure. Your pulse may be weak or may be faster than 100 beats a minute when you are sitting still.  Other changes, such as: ? Feeling very thirsty. ? Sunken eyes. ? Cold hands and feet. ? Confusion. ? Being very tired (lethargic) or having trouble waking from sleep. ? Short-term weight loss. ? Loss of consciousness. How is this diagnosed? This condition is diagnosed based on your symptoms and a physical exam. You may have blood and urine tests to help confirm the diagnosis. How is this treated? Treatment for this condition depends on how severe it is. Treatment should be started right away. Do not wait until dehydration becomes severe. Severe dehydration is an emergency and needs to be treated in a hospital.  Mild or moderate dehydration can be treated at home. You may be asked to: ? Drink more fluids. ? Drink an oral rehydration solution (ORS). This drink helps restore proper amounts of fluids and salts and minerals in the blood (electrolytes).  Severe dehydration can be treated: ? With IV fluids. ? By correcting abnormal levels of electrolytes. This is often done by giving electrolytes through a tube that is passed through your nose and into your stomach (nasogastric tube, or NG tube). ? By treating the underlying cause of dehydration. Follow these instructions at home: Oral rehydration solution If told by your health care provider, drink an ORS:  Make   an ORS by following instructions on the package.  Start by drinking small amounts, about  cup (120 mL) every 5-10 minutes.  Slowly increase how much you drink until you have taken the amount recommended by your health care provider. Eating and drinking  Drink enough clear fluid to keep your urine pale yellow. If you were told to drink an ORS, finish the ORS first and then start slowly drinking  other clear fluids. Drink fluids such as: ? Water. Do not drink only water. Doing that can lead to hyponatremia, which is having too little salt (sodium) in the body. ? Water from ice chips you suck on. ? Fruit juice that you have added water to (diluted fruit juice). ? Low-calorie sports drinks.  Eat foods that contain a healthy balance of electrolytes, such as bananas, oranges, potatoes, tomatoes, and spinach.  Do not drink alcohol.  Avoid the following: ? Drinks that contain a lot of sugar. These include high-calorie sports drinks, fruit juice that is not diluted, and soda. ? Caffeine. ? Foods that are greasy or contain a lot of fat or sugar.         General instructions  Take over-the-counter and prescription medicines only as told by your health care provider.  Do not take sodium tablets. Doing that can lead to having too much sodium in the body (hypernatremia).  Return to your normal activities as told by your health care provider. Ask your health care provider what activities are safe for you.  Keep all follow-up visits as told by your health care provider. This is important. Contact a health care provider if:  You have muscle cramps, pain, or discomfort, such as: ? Pain in your abdomen and the pain gets worse or stays in one area (localizes). ? Stiff neck.  You have a rash.  You are more irritable than usual.  You are sleepier or have a harder time waking than usual.  You feel weak or dizzy.  You feel very thirsty. Get help right away if you have:  Any symptoms of severe dehydration.  Symptoms of vomiting, such as: ? You cannot eat or drink without vomiting. ? Vomiting gets worse or does not go away. ? Vomit includes blood or green matter (bile).  Symptoms that get worse with treatment.  A fever.  A severe headache.  Problems with urination or bowel movements, such as: ? Diarrhea that gets worse or does not go away. ? Blood in your stool (feces).  This may cause stool to look black and tarry. ? Not urinating, or urinating only a small amount of very dark urine, within 6-8 hours.  Trouble breathing. These symptoms may represent a serious problem that is an emergency. Do not wait to see if the symptoms will go away. Get medical help right away. Call your local emergency services (911 in the [place] Do not drive yourself to the hospital. Summary  Dehydration is a condition in which there is not enough water or other fluids in the body. This happens when a person loses more fluids than he or she takes in.  Treatment for this condition depends on how severe it is. Treatment should be started right away. Do not wait until dehydration becomes severe.  Drink enough clear fluid to keep your urine pale yellow. If you were told to drink an oral rehydration solution (ORS), finish the ORS first and then start slowly drinking other clear fluids.  Take over-the-counter and prescription medicines only as told by your health   care provider.  Get help right away if you have any symptoms of severe dehydration. This information is not intended to replace advice given to you by your health care provider. Make sure you discuss any questions you have with your health care provider. Document Revised: 12/04/2018 Document Reviewed: 12/04/2018 Elsevier Patient Education  2021 [employer] Inc.  

## 2020-07-08 NOTE — Assessment & Plan Note (Signed)
She has significant hypertension likely exacerbated by anxiety I do not plan to change her medications

## 2020-07-11 ENCOUNTER — Other Ambulatory Visit: Payer: Self-pay | Admitting: Hematology and Oncology

## 2020-07-11 ENCOUNTER — Telehealth: Payer: Self-pay

## 2020-07-11 DIAGNOSIS — C778 Secondary and unspecified malignant neoplasm of lymph nodes of multiple regions: Secondary | ICD-10-CM

## 2020-07-11 DIAGNOSIS — C7800 Secondary malignant neoplasm of unspecified lung: Secondary | ICD-10-CM

## 2020-07-11 DIAGNOSIS — C541 Malignant neoplasm of endometrium: Secondary | ICD-10-CM

## 2020-07-11 NOTE — Telephone Encounter (Signed)
She called back and left a message. She is feeling much better and will see you at the next appt. She has been eating and drinking all weekend.

## 2020-07-11 NOTE — Telephone Encounter (Signed)
-----   Message from [name], MD sent at 07/11/2020  8:02 AM EST ----- Can you call and ask how she is doing>?

## 2020-07-11 NOTE — Telephone Encounter (Signed)
Called and left a message asking her to call the office back. 

## 2020-07-14 ENCOUNTER — Inpatient Hospital Stay: Payer: 59

## 2020-07-14 ENCOUNTER — Inpatient Hospital Stay: Payer: 59 | Admitting: Nutrition

## 2020-07-14 ENCOUNTER — Other Ambulatory Visit: Payer: Self-pay | Admitting: Hematology and Oncology

## 2020-07-14 ENCOUNTER — Telehealth: Payer: Self-pay | Admitting: Hematology and Oncology

## 2020-07-14 ENCOUNTER — Inpatient Hospital Stay (HOSPITAL_BASED_OUTPATIENT_CLINIC_OR_DEPARTMENT_OTHER): Payer: 59 | Admitting: Hematology and Oncology

## 2020-07-14 ENCOUNTER — Other Ambulatory Visit: Payer: Self-pay

## 2020-07-14 ENCOUNTER — Encounter: Payer: Self-pay | Admitting: Hematology and Oncology

## 2020-07-14 VITALS — BP 194/75 | HR 90 | Temp 97.9°F | Resp 18 | Ht 61.0 in | Wt 226.0 lb

## 2020-07-14 DIAGNOSIS — R300 Dysuria: Secondary | ICD-10-CM

## 2020-07-14 DIAGNOSIS — C7800 Secondary malignant neoplasm of unspecified lung: Secondary | ICD-10-CM

## 2020-07-14 DIAGNOSIS — C541 Malignant neoplasm of endometrium: Secondary | ICD-10-CM

## 2020-07-14 DIAGNOSIS — I10 Essential (primary) hypertension: Secondary | ICD-10-CM | POA: Diagnosis not present

## 2020-07-14 DIAGNOSIS — Z5111 Encounter for antineoplastic chemotherapy: Secondary | ICD-10-CM | POA: Diagnosis not present

## 2020-07-14 DIAGNOSIS — G620 Drug-induced polyneuropathy: Secondary | ICD-10-CM | POA: Diagnosis not present

## 2020-07-14 DIAGNOSIS — G893 Neoplasm related pain (acute) (chronic): Secondary | ICD-10-CM

## 2020-07-14 DIAGNOSIS — E876 Hypokalemia: Secondary | ICD-10-CM

## 2020-07-14 DIAGNOSIS — C778 Secondary and unspecified malignant neoplasm of lymph nodes of multiple regions: Secondary | ICD-10-CM

## 2020-07-14 DIAGNOSIS — T451X5A Adverse effect of antineoplastic and immunosuppressive drugs, initial encounter: Secondary | ICD-10-CM

## 2020-07-14 DIAGNOSIS — R350 Frequency of micturition: Secondary | ICD-10-CM

## 2020-07-14 DIAGNOSIS — D539 Nutritional anemia, unspecified: Secondary | ICD-10-CM

## 2020-07-14 LAB — CBC WITH DIFFERENTIAL/PLATELET
Abs Immature Granulocytes: 0.1 10*3/uL — ABNORMAL HIGH (ref 0.00–0.07)
Basophils Absolute: 0 10*3/uL (ref 0.0–0.1)
Basophils Relative: 0 %
Eosinophils Absolute: 0 10*3/uL (ref 0.0–0.5)
Eosinophils Relative: 0 %
HCT: 32.5 % — ABNORMAL LOW (ref 36.0–46.0)
Hemoglobin: 10.4 g/dL — ABNORMAL LOW (ref 12.0–15.0)
Immature Granulocytes: 1 %
Lymphocytes Relative: 11 %
Lymphs Abs: 0.9 10*3/uL (ref 0.7–4.0)
MCH: 21.9 pg — ABNORMAL LOW (ref 26.0–34.0)
MCHC: 32 g/dL (ref 30.0–36.0)
MCV: 68.4 fL — ABNORMAL LOW (ref 80.0–100.0)
Monocytes Absolute: 0.3 10*3/uL (ref 0.1–1.0)
Monocytes Relative: 4 %
Neutro Abs: 6.5 10*3/uL (ref 1.7–7.7)
Neutrophils Relative %: 84 %
Platelets: 400 10*3/uL (ref 150–400)
RBC: 4.75 MIL/uL (ref 3.87–5.11)
RDW: 21.5 % — ABNORMAL HIGH (ref 11.5–15.5)
WBC: 7.7 10*3/uL (ref 4.0–10.5)
nRBC: 0 % (ref 0.0–0.2)

## 2020-07-14 LAB — COMPREHENSIVE METABOLIC PANEL
ALT: 13 U/L (ref 0–44)
AST: 9 U/L — ABNORMAL LOW (ref 15–41)
Albumin: 3.6 g/dL (ref 3.5–5.0)
Alkaline Phosphatase: 88 U/L (ref 38–126)
Anion gap: 10 (ref 5–15)
BUN: 12 mg/dL (ref 8–23)
CO2: 28 mmol/L (ref 22–32)
Calcium: 9.4 mg/dL (ref 8.9–10.3)
Chloride: 98 mmol/L (ref 98–111)
Creatinine, Ser: 0.87 mg/dL (ref 0.44–1.00)
GFR, Estimated: >60 mL/min (ref >60)
Glucose, Bld: 203 mg/dL — ABNORMAL HIGH (ref 70–99)
Potassium: 3 mmol/L — ABNORMAL LOW (ref 3.5–5.1)
Sodium: 136 mmol/L (ref 135–145)
Total Bilirubin: 0.5 mg/dL (ref 0.3–1.2)
Total Protein: 7.9 g/dL (ref 6.5–8.1)

## 2020-07-14 LAB — MAGNESIUM: Magnesium: 1.4 mg/dL — ABNORMAL LOW (ref 1.7–2.4)

## 2020-07-14 MED ORDER — ACETAMINOPHEN 325 MG PO TABS
ORAL_TABLET | ORAL | Status: AC
  Filled 2020-07-14: qty 2

## 2020-07-14 MED ORDER — SODIUM CHLORIDE 0.9 % IV SOLN
150.0000 mg | Freq: Once | INTRAVENOUS | Status: AC
  Administered 2020-07-14: 150 mg via INTRAVENOUS
  Filled 2020-07-14: qty 150

## 2020-07-14 MED ORDER — SODIUM CHLORIDE 0.9% FLUSH
10.0000 mL | Freq: Once | INTRAVENOUS | Status: AC
  Administered 2020-07-14: 10 mL
  Filled 2020-07-14: qty 10

## 2020-07-14 MED ORDER — TRASTUZUMAB-DKST CHEMO 150 MG IV SOLR
600.0000 mg | Freq: Once | INTRAVENOUS | Status: AC
  Administered 2020-07-14: 600 mg via INTRAVENOUS
  Filled 2020-07-14: qty 28.57

## 2020-07-14 MED ORDER — SODIUM CHLORIDE 0.9 % IV SOLN
105.0000 mg/m2 | Freq: Once | INTRAVENOUS | Status: AC
  Administered 2020-07-14: 222 mg via INTRAVENOUS
  Filled 2020-07-14: qty 37

## 2020-07-14 MED ORDER — PALONOSETRON HCL INJECTION 0.25 MG/5ML
INTRAVENOUS | Status: AC
  Filled 2020-07-14: qty 5

## 2020-07-14 MED ORDER — SODIUM CHLORIDE 0.9 % IV SOLN
10.0000 mg | Freq: Once | INTRAVENOUS | Status: AC
  Administered 2020-07-14: 10 mg via INTRAVENOUS
  Filled 2020-07-14: qty 10

## 2020-07-14 MED ORDER — FAMOTIDINE IN NACL 20-0.9 MG/50ML-% IV SOLN
20.0000 mg | Freq: Once | INTRAVENOUS | Status: AC
  Administered 2020-07-14: 20 mg via INTRAVENOUS

## 2020-07-14 MED ORDER — SODIUM CHLORIDE 0.9 % IV SOLN
653.5000 mg | Freq: Once | INTRAVENOUS | Status: AC
  Administered 2020-07-14: 650 mg via INTRAVENOUS
  Filled 2020-07-14: qty 65

## 2020-07-14 MED ORDER — SODIUM CHLORIDE 0.9% FLUSH
10.0000 mL | INTRAVENOUS | Status: DC | PRN
  Administered 2020-07-14: 10 mL
  Filled 2020-07-14: qty 10

## 2020-07-14 MED ORDER — FAMOTIDINE IN NACL 20-0.9 MG/50ML-% IV SOLN
INTRAVENOUS | Status: AC
  Filled 2020-07-14: qty 50

## 2020-07-14 MED ORDER — PALONOSETRON HCL INJECTION 0.25 MG/5ML
0.2500 mg | Freq: Once | INTRAVENOUS | Status: AC
  Administered 2020-07-14: 0.25 mg via INTRAVENOUS

## 2020-07-14 MED ORDER — HEPARIN SOD (PORK) LOCK FLUSH 100 UNIT/ML IV SOLN
500.0000 [IU] | Freq: Once | INTRAVENOUS | Status: AC | PRN
Start: 2020-07-14 — End: 2020-07-14
  Administered 2020-07-14: 500 [IU]
  Filled 2020-07-14: qty 5

## 2020-07-14 MED ORDER — SODIUM CHLORIDE 0.9 % IV SOLN
Freq: Once | INTRAVENOUS | Status: AC
  Filled 2020-07-14: qty 250

## 2020-07-14 MED ORDER — ACETAMINOPHEN 325 MG PO TABS
650.0000 mg | ORAL_TABLET | Freq: Once | ORAL | Status: AC
  Administered 2020-07-14: 650 mg via ORAL

## 2020-07-14 MED ORDER — DIPHENHYDRAMINE HCL 50 MG/ML IJ SOLN
25.0000 mg | Freq: Once | INTRAMUSCULAR | Status: AC
  Administered 2020-07-14: 25 mg via INTRAVENOUS

## 2020-07-14 MED ORDER — DIPHENHYDRAMINE HCL 50 MG/ML IJ SOLN
INTRAMUSCULAR | Status: AC
  Filled 2020-07-14: qty 1

## 2020-07-14 MED ORDER — LOSARTAN POTASSIUM 100 MG PO TABS: 100.0000 mg | ORAL_TABLET | Freq: Every day | ORAL | 1 refills | Status: DC

## 2020-07-14 NOTE — Progress Notes (Signed)
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 The patient denies recent history of bleeding such as epistaxis, hematuria or hematochezia. She is asymptomatic from the anemia. I will observe for now.  She does not require transfusion now. I will continue the chemotherapy at current dose without dosage adjustment.  If the anemia gets progressive worse in the future, I might have to delay her treatment or adjust the chemotherapy dose.   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 I plan to reduce the dose of treatment as outlined above.    Essential hypertension Her bP is very high I recommend increase losartan She will continue close BP check at home  Hypomagnesemia We will prescribe IV magnesium today   Hypokalemia, inadequate intake She does not need IV potassium today   Orders Placed This Encounter  Procedures  . CT CHEST ABDOMEN PELVIS W CONTRAST    Standing Status:   Future    Standing Expiration Date:   07/14/2021    Order Specific Question:   Preferred imaging location?    Answer:   [facility]    Order Specific Question:   Radiology Contrast Protocol - do NOT remove file path    Answer:    [email or web address]    All questions were answered. The patient knows to call the clinic with any problems, questions or concerns. The total time spent in the appointment was 40 minutes encounter with patients including review of chart and various tests results, discussions about plan of care and coordination of care plan   [name], MD 07/14/2020 12:28 PM  INTERVAL HISTORY: Please see below for problem oriented charting. 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     02/26/2020 Imaging   1. 3 mm nonobstructing renal stone within the left kidney. 2. Findings suggestive of a small hemorrhagic cyst within the lower pole of the right kidney, with an anterior right lower pole heterogeneous renal soft tissue mass. MRI correlation is recommended, as an underlying neoplastic process cannot be excluded. 3. Colonic diverticulosis. 4. Multiple exophytic uterine fibroids. 5. Evidence of prior cholecystectomy.   03/28/2020 Imaging   2.4 cm enhancing mass in the anterior right lower kidney, suspicious for solid renal neoplasm such as renal cell carcinoma.   Single right renal artery  and vein.  No renal vein invasion.   Small retroperitoneal nodes, measuring up to 9 mm short axis, mildly prominent but technically within the upper limits of normal. Attention on follow-up is suggested.   4 x 5 mm nodule in the medial right lower lobe. This is nonspecific and unlikely to reflect a metastasis  but warrants attention on follow-up. Consider dedicated CT chest in 3-6 months.   3 mm nonobstructing left lower pole renal calculus. No hydronephrosis.     04/04/2020 Imaging   1. 10 mm endometrial stripe thickness. This is considered abnormal in a postmenopausal female with bleeding. In the setting of post-menopausal bleeding, endometrial sampling is indicated to exclude carcinoma.  2. Multiple uterine fibroids including potential submucosal fibroid in the lower uterine segment.   04/19/2020 Pathology Results   A. ENDOMETRIUM, BIOPSY:  -  High-grade carcinoma  Immunohistochemical and morphometric analysis performed manually   The tumor cells are POSITIVE for Her2 (3+).    04/19/2020 Procedure   She was seen by Dr. Astrid Drafts on 04/19/2020.  At that visit her cervix was noted to be nodular and firm and abnormal appearing.  Pap taken at that visit was positive for adenocarcinoma, endometrial.  An endometrial Pipelle biopsy was taken at the same visit which revealed high-grade endometrial cancer, suspicious for serous carcinoma.     05/12/2020 Cancer Staging   Staging form: Corpus Uteri - Carcinoma and Carcinosarcoma, AJCC 8th Edition - Clinical stage from 05/12/2020: FIGO Stage IVB (cT3, cN2, cM1) - Signed by [name], MD on 05/31/2020   05/30/2020 PET scan   1. Hypermetabolic lymph nodes identified in the left supraclavicular region/thoracic inlet, mediastinum, hilar regions, right retrocrural space, para-aortic retroperitoneum, and bilateral pelvic sidewall, compatible with metastatic disease. 2. Moderate volume ascites on this noncontrast CT study largely obscures peritoneal and omental disease although hypermetabolic plaque-like uptake in the anterior high abdomen is probably peritoneal with relatively bulky hypermetabolism in the omentum. These findings are also compatible with metastatic disease. 3. Hypermetabolic left upper lobe pulmonary nodule consistent with metastatic disease. Lung  primary is considered less likely but not excluded. 4. Scattered areas of focal marrow uptake are suspicious for bony metastases although no underlying abnormality is evident on CT imaging. 5. Diffuse uptake in the uterine anatomy compatible with the patient's known history of endometrial carcinoma. 6. As above, moderate volume ascites is new in the interval. 7. Enhancing lesion lower pole right kidney seen on previous CT of 03/28/2020 is less conspicuous on today's noncontrast CT imaging. Diffuse normal background FDG accumulation could obscure hypermetabolism in this lesion previously characterized as suspicious for renal cell carcinoma. 8. Bibasilar collapse/consolidation in the lower lobes.   06/01/2020 Procedure   Ultrasound and fluoroscopically guided right internal jugular single lumen power port catheter insertion. Tip in the SVC/RA junction. Catheter ready for use.   06/02/2020 Echocardiogram    1. GLS -18.2%.  2. Left ventricular ejection fraction, by estimation, is 60 to 65%. The left ventricle has normal function. The left ventricle has no regional wall motion abnormalities. There is mild left ventricular hypertrophy. Left ventricular diastolic parameters are consistent with Grade I diastolic dysfunction (impaired relaxation).  3. Right ventricular systolic function is normal. The right ventricular size is normal.  4. The mitral valve is normal in structure. No evidence of mitral valve regurgitation. No evidence of mitral stenosis.  5. The aortic valve is tricuspid. Aortic valve regurgitation is not visualized. No aortic stenosis is present.  6. The inferior vena cava is  normal in size with greater than 50% respiratory variability, suggesting right atrial pressure of 3 mmHg.     06/03/2020 -  Chemotherapy    Patient is on Treatment Plan: UTERINE SEROUS CARCINOMA CARBOPLATIN + PACLITAXEL + TRASTUZUMAB Q21D X 6 CYCLES / TRASTUZUMAB Q21D      Metastasis to lymph nodes ([place])  05/31/2020  Initial Diagnosis   Metastasis to lymph nodes ([place])   06/03/2020 -  Chemotherapy    Patient is on Treatment Plan: UTERINE SEROUS CARCINOMA CARBOPLATIN + PACLITAXEL + TRASTUZUMAB Q21D X 6 CYCLES / TRASTUZUMAB Q21D      Metastasis to lung ([place])  05/31/2020 Initial Diagnosis   Metastasis to lung ([place])   06/03/2020 -  Chemotherapy    Patient is on Treatment Plan: UTERINE SEROUS CARCINOMA CARBOPLATIN + PACLITAXEL + TRASTUZUMAB Q21D X 6 CYCLES / TRASTUZUMAB Q21D        REVIEW OF SYSTEMS:   Constitutional: Denies fevers, chills Eyes: Denies blurriness of vision Ears, nose, mouth, throat, and face: Denies mucositis or sore throat Respiratory: Denies cough, dyspnea or wheezes Cardiovascular: Denies palpitation, chest discomfort or lower extremity swelling Skin: Denies abnormal skin rashes Lymphatics: Denies new lymphadenopathy or easy bruising Behavioral/Psych: Mood is stable, no new changes  All other systems were reviewed with the patient and are negative.  I have reviewed the past medical history, past surgical history, social history and family history with the patient and they are unchanged from previous note.  ALLERGIES:  has No Known Allergies.  MEDICATIONS:  Current Outpatient Medications  Medication Sig Dispense Refill  . amLODipine (NORVASC) 10 MG tablet Take 1 tablet (10 mg total) by mouth daily. 30 tablet 1  . dexamethasone (DECADRON) 4 MG tablet Take 2 tabs at the night before and 2 tab the morning of chemotherapy, every 3 weeks, by mouth x 6 cycles 36 tablet 6  . lactulose (CHRONULAC) 10 GM/15ML solution TAKE 30 MILLILITERS BY MOUTH 3 TIMES DAILY 946 mL 1  . lidocaine-prilocaine (EMLA) cream Apply to affected area once 30 g 3  . losartan (COZAAR) 100 MG tablet Take 1 tablet (100 mg total) by mouth daily. 90 tablet 1  . ondansetron (ZOFRAN) 8 MG tablet Take 1 tablet (8 mg total) by mouth every 8 (eight) hours as needed for refractory nausea / vomiting. Start on day 3 after  carboplatin chemo. 30 tablet 1  . oxyCODONE (OXY IR/ROXICODONE) 5 MG immediate release tablet Take 1 tablet (5 mg total) by mouth every 4 (four) hours as needed for severe pain. 60 tablet 0  . polyethylene glycol (MIRALAX / GLYCOLAX) 17 g packet Take 17 g by mouth daily.    . prochlorperazine (COMPAZINE) 10 MG tablet TAKE 1 TABLET(10 MG) BY MOUTH EVERY 6 HOURS AS NEEDED FOR NAUSEA OR VOMITING 30 tablet 1   No current facility-administered medications for this visit.   Facility-Administered Medications Ordered in Other Visits  Medication Dose Route Frequency Provider Last Rate Last Admin  . 0.9 %  sodium chloride infusion   Intravenous Once Alvy Bimler, Rochell Puett, MD      . acetaminophen (TYLENOL) tablet 650 mg  650 mg Oral Once Alvy Bimler, Cristiana Yochim, MD      . CARBOplatin (PARAPLATIN) 650 mg in sodium chloride 0.9 % 250 mL chemo infusion  650 mg Intravenous Once Alvy Bimler, Tyrelle Raczka, MD      . dexamethasone (DECADRON) 10 mg in sodium chloride 0.9 % 50 mL IVPB  10 mg Intravenous Once [name], MD      . diphenhydrAMINE (  BENADRYL) injection 25 mg  25 mg Intravenous Once Alvy Bimler, Yashica Sterbenz, MD      . famotidine (PEPCID) IVPB 20 mg premix  20 mg Intravenous Once Alvy Bimler, Lott Seelbach, MD      . fosaprepitant (EMEND) 150 mg in sodium chloride 0.9 % 145 mL IVPB  150 mg Intravenous Once Alvy Bimler, Damyan Corne, MD      . heparin lock flush 100 unit/mL  500 Units Intracatheter Once PRN Alvy Bimler, Yovany Clock, MD      . PACLitaxel (TAXOL) 222 mg in sodium chloride 0.9 % 250 mL chemo infusion (> 58m/m2)  105 mg/m2 (Treatment Plan Recorded) Intravenous Once GAlvy Bimler Alexis Mizuno, MD      . palonosetron (ALOXI) injection 0.25 mg  0.25 mg Intravenous Once Jasan Doughtie, MD      . sodium chloride flush (NS) 0.9 % injection 10 mL  10 mL Intracatheter PRN GAlvy Bimler Mckoy Bhakta, MD      . trastuzumab-dkst (OGIVRI) 609 mg in sodium chloride 0.9 % 250 mL chemo infusion  6 mg/kg (Treatment Plan Recorded) Intravenous Once GAlvy Bimler Lillan Mccreadie, MD        PHYSICAL EXAMINATION: ECOG PERFORMANCE STATUS: 1 -  Symptomatic but completely ambulatory  Vitals:   07/14/20 1201  BP: (!) 194/75  Pulse: 90  Resp: 18  Temp: 97.9 F (36.6 C)  SpO2: 100%   Filed Weights   07/14/20 1201  Weight: 226 lb (102.5 kg)    GENERAL:alert, no distress and comfortable SKIN: skin color, texture, turgor are normal, no rashes or significant lesions EYES: normal, Conjunctiva are pink and non-injected, sclera clear OROPHARYNX:no exudate, no erythema and lips, buccal mucosa, and tongue normal  NECK: supple, thyroid normal size, non-tender, without nodularity LYMPH:  no palpable lymphadenopathy in the cervical, axillary or inguinal LUNGS: clear to auscultation and percussion with normal breathing effort HEART: regular rate & rhythm and no murmurs and no lower extremity edema ABDOMEN:abdomen soft, non-tender and normal bowel sounds Musculoskeletal:no cyanosis of digits and no clubbing  NEURO: alert & oriented x 3 with fluent speech, no focal motor/sensory deficits  LABORATORY DATA:  I have reviewed the data as listed    Component Value Date/Time   NA 136 07/14/2020 1141   NA 138 02/23/2020 1113   K 3.0 (L) 07/14/2020 1141   CL 98 07/14/2020 1141   CO2 28 07/14/2020 1141   GLUCOSE 203 (H) 07/14/2020 1141   BUN 12 07/14/2020 1141   BUN 15 02/23/2020 1113   CREATININE 0.87 07/14/2020 1141   CALCIUM 9.4 07/14/2020 1141   PROT 7.9 07/14/2020 1141   PROT 7.8 02/23/2020 1113   ALBUMIN 3.6 07/14/2020 1141   ALBUMIN 4.4 02/23/2020 1113   AST 9 (L) 07/14/2020 1141   ALT 13 07/14/2020 1141   ALKPHOS 88 07/14/2020 1141   BILITOT 0.5 07/14/2020 1141   BILITOT 0.6 02/23/2020 1113   GFRNONAA >60 07/14/2020 1141   GFRAA 91 02/23/2020 1113    No results found for: SPEP, UPEP  Lab Results  Component Value Date   WBC 7.7 07/14/2020   NEUTROABS 6.5 07/14/2020   HGB 10.4 (L) 07/14/2020   HCT 32.5 (L) 07/14/2020   MCV 68.4 (L) 07/14/2020   PLT 400 07/14/2020      Chemistry      Component Value Date/Time    NA 136 07/14/2020 1141   NA 138 02/23/2020 1113   K 3.0 (L) 07/14/2020 1141   CL 98 07/14/2020 1141   CO2 28 07/14/2020 1141   BUN 12 07/14/2020 1141  BUN 15 02/23/2020 1113   CREATININE 0.87 07/14/2020 1141      Component Value Date/Time   CALCIUM 9.4 07/14/2020 1141   ALKPHOS 88 07/14/2020 1141   AST 9 (L) 07/14/2020 1141   ALT 13 07/14/2020 1141   BILITOT 0.5 07/14/2020 1141   BILITOT 0.6 02/23/2020 1113

## 2020-07-14 NOTE — Assessment & Plan Note (Signed)
She stated that her pain is well controlled She does not need medication refill today

## 2020-07-14 NOTE — Progress Notes (Signed)
Per Dr. Alvy Bimler, ok to treat today with BP.

## 2020-07-14 NOTE — Assessment & Plan Note (Signed)
We will prescribe IV magnesium today

## 2020-07-14 NOTE — Assessment & Plan Note (Signed)
she has mild peripheral neuropathy, likely related to side effects of treatment. I plan to reduce the dose of treatment as outlined above.

## 2020-07-14 NOTE — Progress Notes (Signed)
Patient was identified to be at risk for malnutrition on the MST secondary to weight loss and poor appetite.  65 year old female diagnosed with metastatic endometrial cancer followed by Dr. Alvy Bimler receiving chemotherapy.  Past medical history includes diabetes and hypertension.  Medications include Decadron, lactulose, Zofran, MiraLAX, and Compazine.  Labs include potassium 3.0, glucose 203, magnesium 1.4.  Height: 5 feet 1 inches. Weight: 226 pounds March 10. Usual body weight: 250-260 pounds. 264 pounds in January 2022.  247 pounds February 18. BMI: 42.7. ECOG 1.  Noted ascites resolved and some weight loss attributed to loss of fluid. Patient reports taste alterations.  Food taste "off."  She tolerates Gatorade and fruit tastes good to her.  She denies recent nausea and vomiting. Reports she does not enjoy boost.  Nutrition diagnosis:  Unintended weight loss related to a combination of decreased oral intake and resolution of ascites as evidenced by 14% weight loss over 6 weeks which is significant.  Intervention: Encouraged patient to consume smaller amounts of food more often. Educated on high protein foods.  Provided snack education. Educated on strategies for improving taste alterations. Recommended oral nutrition supplements. Encouraged weight maintenance. Provided nutrition fact sheets.  Questions were answered.  Teach back method used.  Contact information given.  Monitoring, evaluation, goals: Patient will tolerate adequate calories and protein for weight maintenance of lean body mass and improved quality of life.  **Disclaimer: This note was dictated with voice recognition software. Similar sounding words can inadvertently be transcribed and this note may contain transcription errors which may not have been corrected upon publication of note.**

## 2020-07-14 NOTE — Assessment & Plan Note (Signed)
Clinically she has responded favorably to chemo treatment Her ascites, leg swelling and abdominal pain are resolved She has lost a lot of weight but I suspect it is because of resolution of ascites I will adjust her chemotherapy dose and plan to reduce taxol also due to neuropathy I plan to order CT in 2 weeks for assessment

## 2020-07-14 NOTE — Assessment & Plan Note (Signed)

## 2020-07-14 NOTE — Patient Instructions (Signed)
Implanted Port Insertion, Care After This sheet gives you information about how to care for yourself after your procedure. Your health care provider may also give you more specific instructions. If you have problems or questions, contact your health care provider. What can I expect after the procedure? After the procedure, it is common to have:  Discomfort at the port insertion site.  Bruising on the skin over the port. This should improve over 3-4 days. Follow these instructions at home: Port care  After your port is placed, you will get a manufacturer's information card. The card has information about your port. Keep this card with you at all times.  Take care of the port as told by your health care provider. Ask your health care provider if you or a family member can get training for taking care of the port at home. A home health care nurse may also take care of the port.  Make sure to remember what type of port you have. Incision care  Follow instructions from your health care provider about how to take care of your port insertion site. Make sure you: ? Wash your hands with soap and water before and after you change your bandage (dressing). If soap and water are not available, use hand sanitizer. ? Change your dressing as told by your health care provider. ? Leave stitches (sutures), skin glue, or adhesive strips in place. These skin closures may need to stay in place for 2 weeks or longer. If adhesive strip edges start to loosen and curl up, you may trim the loose edges. Do not remove adhesive strips completely unless your health care provider tells you to do that.  Check your port insertion site every day for signs of infection. Check for: ? Redness, swelling, or pain. ? Fluid or blood. ? Warmth. ? Pus or a bad smell.      Activity  Return to your normal activities as told by your health care provider. Ask your health care provider what activities are safe for you.  Do not  lift anything that is heavier than 10 lb (4.5 kg), or the limit that you are told, until your health care provider says that it is safe. General instructions  Take over-the-counter and prescription medicines only as told by your health care provider.  Do not take baths, swim, or use a hot tub until your health care provider approves. Ask your health care provider if you may take showers. You may only be allowed to take sponge baths.  Do not drive for 24 hours if you were given a sedative during your procedure.  Wear a medical alert bracelet in case of an emergency. This will tell any health care providers that you have a port.  Keep all follow-up visits as told by your health care provider. This is important. Contact a health care provider if:  You cannot flush your port with saline as directed, or you cannot draw blood from the port.  You have a fever or chills.  You have redness, swelling, or pain around your port insertion site.  You have fluid or blood coming from your port insertion site.  Your port insertion site feels warm to the touch.  You have pus or a bad smell coming from the port insertion site. Get help right away if:  You have chest pain or shortness of breath.  You have bleeding from your port that you cannot control. Summary  Take care of the port as told by your   health care provider. Keep the manufacturer's information card with you at all times.  Change your dressing as told by your health care provider.  Contact a health care provider if you have a fever or chills or if you have redness, swelling, or pain around your port insertion site.  Keep all follow-up visits as told by your health care provider. This information is not intended to replace advice given to you by your health care provider. Make sure you discuss any questions you have with your health care provider. Document Revised: 11/19/2017 Document Reviewed: 11/19/2017 Elsevier Patient Education   2021 Elsevier Inc.  

## 2020-07-14 NOTE — Assessment & Plan Note (Signed)
She does not need IV potassium today

## 2020-07-14 NOTE — Assessment & Plan Note (Signed)
Her bP is very high I recommend increase losartan She will continue close BP check at home

## 2020-07-14 NOTE — Patient Instructions (Signed)
[facility] Discharge Instructions for Patients Receiving Chemotherapy  Today you received the following chemotherapy agents Traztuzumab, Paclitaxel(Taxol), Carboplatin.  To help prevent nausea and vomiting after your treatment, we encourage you to take your nausea medication as directed.    If you develop nausea and vomiting that is not controlled by your nausea medication, call the clinic.   BELOW ARE SYMPTOMS THAT SHOULD BE REPORTED IMMEDIATELY:  *FEVER GREATER THAN 100.5 F  *CHILLS WITH OR WITHOUT FEVER  NAUSEA AND VOMITING THAT IS NOT CONTROLLED WITH YOUR NAUSEA MEDICATION  *UNUSUAL SHORTNESS OF BREATH  *UNUSUAL BRUISING OR BLEEDING  TENDERNESS IN MOUTH AND THROAT WITH OR WITHOUT PRESENCE OF ULCERS  *URINARY PROBLEMS  *BOWEL PROBLEMS  UNUSUAL RASH Items with * indicate a potential emergency and should be followed up as soon as possible.  Feel free to call the clinic should you have any questions or concerns. The clinic phone number is [phone].  Please show the [facility] at check-in to the Emergency Department and triage nurse.

## 2020-07-14 NOTE — Telephone Encounter (Signed)
Scheduled appts per 3/10 sch msg. Gave pt a print out of AVS.

## 2020-07-15 ENCOUNTER — Other Ambulatory Visit: Payer: Self-pay

## 2020-07-15 ENCOUNTER — Inpatient Hospital Stay: Payer: 59

## 2020-07-15 VITALS — BP 177/69 | HR 68 | Temp 98.1°F | Resp 18

## 2020-07-15 DIAGNOSIS — Z5111 Encounter for antineoplastic chemotherapy: Secondary | ICD-10-CM | POA: Diagnosis not present

## 2020-07-15 DIAGNOSIS — C7800 Secondary malignant neoplasm of unspecified lung: Secondary | ICD-10-CM

## 2020-07-15 DIAGNOSIS — C778 Secondary and unspecified malignant neoplasm of lymph nodes of multiple regions: Secondary | ICD-10-CM

## 2020-07-15 DIAGNOSIS — C541 Malignant neoplasm of endometrium: Secondary | ICD-10-CM

## 2020-07-15 MED ORDER — SODIUM CHLORIDE 0.9% FLUSH
10.0000 mL | Freq: Once | INTRAVENOUS | Status: AC | PRN
  Administered 2020-07-15: 10 mL
  Filled 2020-07-15: qty 10

## 2020-07-15 MED ORDER — MAGNESIUM SULFATE 4 GM/100ML IV SOLN
4.0000 g | Freq: Once | INTRAVENOUS | Status: AC
  Administered 2020-07-15: 4 g via INTRAVENOUS
  Filled 2020-07-15: qty 100

## 2020-07-15 MED ORDER — SODIUM CHLORIDE 0.9 % IV SOLN
INTRAVENOUS | Status: DC
  Filled 2020-07-15: qty 250

## 2020-07-15 MED ORDER — HEPARIN SOD (PORK) LOCK FLUSH 100 UNIT/ML IV SOLN
500.0000 [IU] | Freq: Once | INTRAVENOUS | Status: AC | PRN
  Administered 2020-07-15: 500 [IU]
  Filled 2020-07-15: qty 5

## 2020-07-15 NOTE — Patient Instructions (Signed)
Magnesium Sulfate injection What is this medicine? MAGNESIUM SULFATE (mag NEE zee um SUL fate) is an electrolyte injection commonly used to treat low magnesium levels in your blood. It is also used to prevent or control seizures in women with preeclampsia or eclampsia. This medicine may be used for other purposes; ask your health care provider or pharmacist if you have questions. What should I tell my health care provider before I take this medicine? They need to know if you have any of these conditions:  heart disease  history of irregular heart beat  kidney disease  an unusual or allergic reaction to magnesium sulfate, medicines, foods, dyes, or preservatives  pregnant or trying to get pregnant  breast-feeding How should I use this medicine? This medicine is for infusion into a vein. It is given by a health care professional in a hospital or clinic setting. Talk to your pediatrician regarding the use of this medicine in children. While this drug may be prescribed for selected conditions, precautions do apply. Overdosage: If you think you have taken too much of this medicine contact a poison control center or emergency room at once. NOTE: This medicine is only for you. Do not share this medicine with others. What if I miss a dose? This does not apply. What may interact with this medicine? This medicine may interact with the following medications:  certain medicines for anxiety or sleep  certain medicines for seizures like phenobarbital  digoxin  medicines that relax muscles for surgery  narcotic medicines for pain This list may not describe all possible interactions. Give your health care provider a list of all the medicines, herbs, non-prescription drugs, or dietary supplements you use. Also tell them if you smoke, drink alcohol, or use illegal drugs. Some items may interact with your medicine. What should I watch for while using this medicine? Your condition will be  monitored carefully while you are receiving this medicine. You may need blood work done while you are receiving this medicine. What side effects may I notice from receiving this medicine? Side effects that you should report to your doctor or health care professional as soon as possible:  allergic reactions like skin rash, itching or hives, swelling of the face, lips, or tongue  facial flushing  muscle weakness  signs and symptoms of low blood pressure like dizziness; feeling faint or lightheaded, falls; unusually weak or tired  signs and symptoms of a dangerous change in heartbeat or heart rhythm like chest pain; dizziness; fast or irregular heartbeat; palpitations; breathing problems  sweating This list may not describe all possible side effects. Call your doctor for medical advice about side effects. You may report side effects to FDA at 1-800-FDA-1088. Where should I keep my medicine? This drug is given in a hospital or clinic and will not be stored at home. NOTE: This sheet is a summary. It may not cover all possible information. If you have questions about this medicine, talk to your doctor, pharmacist, or health care provider.  2021 Elsevier/Gold Standard (2015-11-09 12:31:42)  

## 2020-07-22 ENCOUNTER — Other Ambulatory Visit: Payer: Self-pay | Admitting: Hematology and Oncology

## 2020-07-25 ENCOUNTER — Encounter (HOSPITAL_COMMUNITY): Payer: Self-pay

## 2020-07-25 ENCOUNTER — Ambulatory Visit (HOSPITAL_COMMUNITY)
Admission: RE | Admit: 2020-07-25 | Discharge: 2020-07-25 | Disposition: A | Discharge status: Home / Self Care | Payer: 59 | Source: Ambulatory Visit | Attending: Hematology and Oncology | Admitting: Hematology and Oncology

## 2020-07-25 ENCOUNTER — Other Ambulatory Visit: Payer: Self-pay | Admitting: Hematology and Oncology

## 2020-07-25 ENCOUNTER — Other Ambulatory Visit: Payer: Self-pay

## 2020-07-25 DIAGNOSIS — C541 Malignant neoplasm of endometrium: Secondary | ICD-10-CM | POA: Insufficient documentation

## 2020-07-25 DIAGNOSIS — C778 Secondary and unspecified malignant neoplasm of lymph nodes of multiple regions: Secondary | ICD-10-CM

## 2020-07-25 DIAGNOSIS — C7800 Secondary malignant neoplasm of unspecified lung: Secondary | ICD-10-CM

## 2020-07-25 MED ORDER — IOHEXOL 300 MG/ML  SOLN
100.0000 mL | Freq: Once | INTRAMUSCULAR | Status: AC | PRN
  Administered 2020-07-25: 100 mL via INTRAVENOUS

## 2020-07-25 MED ORDER — HEPARIN SOD (PORK) LOCK FLUSH 100 UNIT/ML IV SOLN
INTRAVENOUS | Status: AC
  Filled 2020-07-25: qty 5

## 2020-07-25 MED ORDER — HEPARIN SOD (PORK) LOCK FLUSH 100 UNIT/ML IV SOLN
500.0000 [IU] | Freq: Once | INTRAVENOUS | Status: AC
  Administered 2020-07-25: 500 [IU] via INTRAVENOUS

## 2020-07-26 ENCOUNTER — Telehealth: Payer: Self-pay | Admitting: Hematology and Oncology

## 2020-07-26 ENCOUNTER — Inpatient Hospital Stay: Payer: 59 | Admitting: Hematology and Oncology

## 2020-07-26 DIAGNOSIS — G893 Neoplasm related pain (acute) (chronic): Secondary | ICD-10-CM | POA: Diagnosis not present

## 2020-07-26 DIAGNOSIS — R112 Nausea with vomiting, unspecified: Secondary | ICD-10-CM

## 2020-07-26 DIAGNOSIS — Z5111 Encounter for antineoplastic chemotherapy: Secondary | ICD-10-CM | POA: Diagnosis not present

## 2020-07-26 DIAGNOSIS — E119 Type 2 diabetes mellitus without complications: Secondary | ICD-10-CM

## 2020-07-26 DIAGNOSIS — N2889 Other specified disorders of kidney and ureter: Secondary | ICD-10-CM

## 2020-07-26 DIAGNOSIS — R634 Abnormal weight loss: Secondary | ICD-10-CM

## 2020-07-26 DIAGNOSIS — K5909 Other constipation: Secondary | ICD-10-CM

## 2020-07-26 DIAGNOSIS — I10 Essential (primary) hypertension: Secondary | ICD-10-CM

## 2020-07-26 DIAGNOSIS — D539 Nutritional anemia, unspecified: Secondary | ICD-10-CM

## 2020-07-26 DIAGNOSIS — C541 Malignant neoplasm of endometrium: Secondary | ICD-10-CM | POA: Diagnosis not present

## 2020-07-26 DIAGNOSIS — C7800 Secondary malignant neoplasm of unspecified lung: Secondary | ICD-10-CM | POA: Diagnosis not present

## 2020-07-26 NOTE — Telephone Encounter (Signed)
Scheduled appts per 3/22 sch msg. Pt aware.  

## 2020-07-27 ENCOUNTER — Encounter: Payer: Self-pay | Admitting: Hematology and Oncology

## 2020-07-27 ENCOUNTER — Telehealth: Payer: Self-pay

## 2020-07-27 NOTE — Assessment & Plan Note (Signed)
This is stable in size Observe closely

## 2020-07-27 NOTE — Assessment & Plan Note (Signed)
Recent weight loss is caused by elimination of disease burden I recommend graduated exercise as tolerated It is also healthier for the patient to be at the lower weight range

## 2020-07-27 NOTE — Assessment & Plan Note (Signed)
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

## 2020-07-27 NOTE — Assessment & Plan Note (Signed)
The lung nodules are stable Observe closely

## 2020-07-27 NOTE — Telephone Encounter (Signed)
Called and left a message. Echo scheduled on 4/4 at 10 am, arrive at 0945 to [facility]. Ask her to call the office for questions.

## 2020-07-27 NOTE — Assessment & Plan Note (Signed)
Her blood pressure is mildly elevated but could be due to anxiety Observe closely for now She will continue her blood pressure medication

## 2020-07-27 NOTE — Progress Notes (Signed)
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[facility]) I have reviewed multiple PET CT imaging with the patient and daughter She has excellent response to therapy Her profound, progressive weight loss is due to elimination of disease burden She has virtually no pain and majority of her prior side effects are less with each cycle of treatment I will adjust her chemotherapy today based on recent weight loss I recommend 3-4 more cycles of treatment before repeating CT imaging next time I will order pretreatment echocardiogram before her treatment in April  Metastasis to lung [facility]) The lung nodules are stable Observe closely  Cancer associated pain She has minimum pain She can take pain medicine as needed  Deficiency anemia Her anemia is stable Observe closely  Diabetes mellitus ([place]) She has slight worsening hyperglycemia while on treatment We discussed the importance of dietary modification while on treatment  Essential hypertension Her blood pressure is mildly elevated but could be due to anxiety Observe closely for now She will continue her blood pressure medication  Right renal mass This is stable in size Observe closely  Other constipation Her bowel habits are becoming regular She is aware and will make sure she does not get constipated with next cycle of treatment  Nausea with vomiting 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 ECHOCARDIOGRAM COMPLETE    Standing Status:   Future    Standing Expiration Date:   07/27/2021    Order Specific Question:   Where should this test be performed    Answer:   [place]    Order Specific Question:   Perflutren DEFINITY (image enhancing agent) should be administered unless hypersensitivity or allergy exist    Answer:   Administer Perflutren    Order Specific Question:   Reason for exam-Echo    Answer:   Chemo  [id number]    All questions were answered. The patient knows to call the clinic with any problems, questions or concerns. The total time spent in the appointment was 40 minutes encounter with patients including review of chart and various tests results, discussions about plan of care and coordination of care plan   [name], MD 07/27/2020 9:04 AM  INTERVAL HISTORY: Please see below for problem oriented charting. 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     02/26/2020 Imaging   1. 3 mm nonobstructing renal stone within the left kidney. 2. Findings suggestive of a small hemorrhagic cyst within the lower pole of the right kidney, with an  anterior right lower pole heterogeneous renal soft tissue mass. MRI correlation is recommended, as an underlying neoplastic process cannot be excluded. 3. Colonic diverticulosis. 4. Multiple exophytic uterine fibroids. 5. Evidence of prior cholecystectomy.   03/28/2020 Imaging   2.4 cm enhancing mass in the anterior  right lower kidney, suspicious for solid renal neoplasm such as renal cell carcinoma.   Single right renal artery and vein.  No renal vein invasion.   Small retroperitoneal nodes, measuring up to 9 mm short axis, mildly prominent but technically within the upper limits of normal. Attention on follow-up is suggested.   4 x 5 mm nodule in the medial right lower lobe. This is nonspecific and unlikely to reflect a metastasis but warrants attention on follow-up. Consider dedicated CT chest in 3-6 months.   3 mm nonobstructing left lower pole renal calculus. No hydronephrosis.     04/04/2020 Imaging   1. 10 mm endometrial stripe thickness. This is considered abnormal in a postmenopausal female with bleeding. In the setting of post-menopausal bleeding, endometrial sampling is indicated to exclude carcinoma.  2. Multiple uterine fibroids including potential submucosal fibroid in the lower uterine segment.   04/19/2020 Pathology Results   A. ENDOMETRIUM, BIOPSY:  -  High-grade carcinoma  Immunohistochemical and morphometric analysis performed manually   The tumor cells are POSITIVE for Her2 (3+).    04/19/2020 Procedure   She was seen by Dr. Astrid Drafts on 04/19/2020.  At that visit her cervix was noted to be nodular and firm and abnormal appearing.  Pap taken at that visit was positive for adenocarcinoma, endometrial.  An endometrial Pipelle biopsy was taken at the same visit which revealed high-grade endometrial cancer, suspicious for serous carcinoma.     05/12/2020 Cancer Staging   Staging form: Corpus Uteri - Carcinoma and Carcinosarcoma, AJCC 8th Edition - Clinical stage from 05/12/2020: FIGO Stage IVB (cT3, cN2, cM1) - Signed by [name], MD on 05/31/2020   05/30/2020 PET scan   1. Hypermetabolic lymph nodes identified in the left supraclavicular region/thoracic inlet, mediastinum, hilar regions, right retrocrural space, para-aortic retroperitoneum, and bilateral pelvic sidewall, compatible  with metastatic disease. 2. Moderate volume ascites on this noncontrast CT study largely obscures peritoneal and omental disease although hypermetabolic plaque-like uptake in the anterior high abdomen is probably peritoneal with relatively bulky hypermetabolism in the omentum. These findings are also compatible with metastatic disease. 3. Hypermetabolic left upper lobe pulmonary nodule consistent with metastatic disease. Lung primary is considered less likely but not excluded. 4. Scattered areas of focal marrow uptake are suspicious for bony metastases although no underlying abnormality is evident on CT imaging. 5. Diffuse uptake in the uterine anatomy compatible with the patient's known history of endometrial carcinoma. 6. As above, moderate volume ascites is new in the interval. 7. Enhancing lesion lower pole right kidney seen on previous CT of 03/28/2020 is less conspicuous on today's noncontrast CT imaging. Diffuse normal background FDG accumulation could obscure hypermetabolism in this lesion previously characterized as suspicious for renal cell carcinoma. 8. Bibasilar collapse/consolidation in the lower lobes.   06/01/2020 Procedure   Ultrasound and fluoroscopically guided right internal jugular single lumen power port catheter insertion. Tip in the SVC/RA junction. Catheter ready for use.   06/02/2020 Echocardiogram    1. GLS -18.2%.  2. Left ventricular ejection fraction, by estimation, is 60 to 65%. The left ventricle has normal function. The left ventricle has no regional wall motion abnormalities. There is mild left ventricular hypertrophy. Left ventricular diastolic parameters are consistent  with Grade I diastolic dysfunction (impaired relaxation).  3. Right ventricular systolic function is normal. The right ventricular size is normal.  4. The mitral valve is normal in structure. No evidence of mitral valve regurgitation. No evidence of mitral stenosis.  5. The aortic valve is tricuspid.  Aortic valve regurgitation is not visualized. No aortic stenosis is present.  6. The inferior vena cava is normal in size with greater than 50% respiratory variability, suggesting right atrial pressure of 3 mmHg.     06/03/2020 -  Chemotherapy    Patient is on Treatment Plan: UTERINE SEROUS CARCINOMA CARBOPLATIN + PACLITAXEL + TRASTUZUMAB Q21D X 6 CYCLES / TRASTUZUMAB Q21D      07/25/2020 Imaging   1. Interval response to therapy. There has been interval decrease in size of mediastinal, retrocrural, retroperitoneal, and left pelvic sidewall lymph nodes. 2. Decreased volume of ascites. Interval decrease in thickness of omental cake secondary to peritoneal disease. 3. Decrease in size of FDG avid tumor within the umbilicus. 4. Decrease in size of left upper lobe lung nodule. 5. Persistent left-sided hydronephrosis and hydroureter. No obstructing stone identified. 6. Previously noted solid enhancing lesion arising off the anterior cortex of the inferior pole of the right kidney is again noted and remains worrisome for renal cell carcinoma. 7. Aortic atherosclerosis.     Metastasis to lymph nodes ([place])  05/31/2020 Initial Diagnosis   Metastasis to lymph nodes ([place])   06/03/2020 -  Chemotherapy    Patient is on Treatment Plan: UTERINE SEROUS CARCINOMA CARBOPLATIN + PACLITAXEL + TRASTUZUMAB Q21D X 6 CYCLES / TRASTUZUMAB Q21D      Metastasis to lung ([place])  05/31/2020 Initial Diagnosis   Metastasis to lung ([place])   06/03/2020 -  Chemotherapy    Patient is on Treatment Plan: UTERINE SEROUS CARCINOMA CARBOPLATIN + PACLITAXEL + TRASTUZUMAB Q21D X 6 CYCLES / TRASTUZUMAB Q21D        REVIEW OF SYSTEMS:   Constitutional: Denies fevers, chills  Eyes: Denies blurriness of vision Ears, nose, mouth, throat, and face: Denies mucositis or sore throat Respiratory: Denies cough, dyspnea or wheezes Cardiovascular: Denies palpitation, chest discomfort or lower extremity swelling Gastrointestinal:   Denies nausea, heartburn or change in bowel habits Skin: Denies abnormal skin rashes Lymphatics: Denies new lymphadenopathy or easy bruising Neurological:Denies numbness, tingling or new weaknesses Behavioral/Psych: Mood is stable, no new changes  All other systems were reviewed with the patient and are negative.  I have reviewed the past medical history, past surgical history, social history and family history with the patient and they are unchanged from previous note.  ALLERGIES:  has No Known Allergies.  MEDICATIONS:  Current Outpatient Medications  Medication Sig Dispense Refill  . amLODipine (NORVASC) 10 MG tablet Take 1 tablet (10 mg total) by mouth daily. 30 tablet 1  . dexamethasone (DECADRON) 4 MG tablet Take 2 tabs at the night before and 2 tab the morning of chemotherapy, every 3 weeks, by mouth x 6 cycles 36 tablet 6  . lactulose (CHRONULAC) 10 GM/15ML solution TAKE 30 ML BY MOUTH THREE TIMES DAILY 946 mL 1  . lidocaine-prilocaine (EMLA) cream Apply to affected area once 30 g 3  . losartan (COZAAR) 100 MG tablet Take 1 tablet (100 mg total) by mouth daily. 90 tablet 1  . ondansetron (ZOFRAN) 8 MG tablet Take 1 tablet (8 mg total) by mouth every 8 (eight) hours as needed for refractory nausea / vomiting. Start on day 3 after carboplatin chemo. 30 tablet 1  .  oxyCODONE (OXY IR/ROXICODONE) 5 MG immediate release tablet Take 1 tablet (5 mg total) by mouth every 4 (four) hours as needed for severe pain. 60 tablet 0  . polyethylene glycol (MIRALAX / GLYCOLAX) 17 g packet Take 17 g by mouth daily.    . prochlorperazine (COMPAZINE) 10 MG tablet TAKE 1 TABLET(10 MG) BY MOUTH EVERY 6 HOURS AS NEEDED FOR NAUSEA OR VOMITING 30 tablet 1   No current facility-administered medications for this visit.    PHYSICAL EXAMINATION: ECOG PERFORMANCE STATUS: 1 - Symptomatic but completely ambulatory  Vitals:   07/26/20 1227  BP: (!) 149/72  Pulse: (!) 119  Resp: 18  Temp: 98 F (36.7 C)   SpO2: 99%   Filed Weights   07/26/20 1227  Weight: 214 lb 9.6 oz (97.3 kg)    GENERAL:alert, no distress and comfortable NEURO: alert & oriented x 3 with fluent speech, no focal motor/sensory deficits  LABORATORY DATA:  I have reviewed the data as listed    Component Value Date/Time   NA 136 07/14/2020 1141   NA 138 02/23/2020 1113   K 3.0 (L) 07/14/2020 1141   CL 98 07/14/2020 1141   CO2 28 07/14/2020 1141   GLUCOSE 203 (H) 07/14/2020 1141   BUN 12 07/14/2020 1141   BUN 15 02/23/2020 1113   CREATININE 0.87 07/14/2020 1141   CALCIUM 9.4 07/14/2020 1141   PROT 7.9 07/14/2020 1141   PROT 7.8 02/23/2020 1113   ALBUMIN 3.6 07/14/2020 1141   ALBUMIN 4.4 02/23/2020 1113   AST 9 (L) 07/14/2020 1141   ALT 13 07/14/2020 1141   ALKPHOS 88 07/14/2020 1141   BILITOT 0.5 07/14/2020 1141   BILITOT 0.6 02/23/2020 1113   GFRNONAA >60 07/14/2020 1141   GFRAA 91 02/23/2020 1113    No results found for: SPEP, UPEP  Lab Results  Component Value Date   WBC 7.7 07/14/2020   NEUTROABS 6.5 07/14/2020   HGB 10.4 (L) 07/14/2020   HCT 32.5 (L) 07/14/2020   MCV 68.4 (L) 07/14/2020   PLT 400 07/14/2020      Chemistry      Component Value Date/Time   NA 136 07/14/2020 1141   NA 138 02/23/2020 1113   K 3.0 (L) 07/14/2020 1141   CL 98 07/14/2020 1141   CO2 28 07/14/2020 1141   BUN 12 07/14/2020 1141   BUN 15 02/23/2020 1113   CREATININE 0.87 07/14/2020 1141      Component Value Date/Time   CALCIUM 9.4 07/14/2020 1141   ALKPHOS 88 07/14/2020 1141   AST 9 (L) 07/14/2020 1141   ALT 13 07/14/2020 1141   BILITOT 0.5 07/14/2020 1141   BILITOT 0.6 02/23/2020 1113       RADIOGRAPHIC STUDIES: I have reviewed multiple imaging studies with the patient and family I have personally reviewed the radiological images as listed and agreed with the findings in the report. CT CHEST ABDOMEN PELVIS W CONTRAST  Result Date: 07/25/2020 CLINICAL DATA:  Restaging uterine/cervical cancer. EXAM:  CT CHEST, ABDOMEN, AND PELVIS WITH CONTRAST TECHNIQUE: Multidetector CT imaging of the chest, abdomen and pelvis was performed following the standard protocol during bolus administration of intravenous contrast. CONTRAST:  166m OMNIPAQUE IOHEXOL 300 MG/ML  SOLN COMPARISON:  PET-CT 05/30/2020. FINDINGS: CT CHEST FINDINGS Cardiovascular: The heart size appears within normal limits. No pericardial effusion. Mild aortic atherosclerosis. Mediastinum/Nodes: Normal appearance of the thyroid gland. The trachea appears patent and is midline. Normal appearance of the esophagus. Stable 8 mm left supraclavicular lymph  node, image 4/2. The left pre-vascular lymph node in the superior mediastinum measures 0.7 cm, image 7/2. Previously 1 cm. Low right paratracheal node at the carina measures 0.7 cm, image 19/2. Previously 1 cm. Index right retrocrural lymph node measures 5 mm, image 44/2. Previously 10 mm. Lungs/Pleura: No pleural effusion. Lingular nodule measures 0.7 cm, image 67/4.  Previously 1.2 cm. Chronic, small nodular density within the medial right lung base is unchanged and is favored to represent an area of chronic distal airway impaction, image 93/4. 4 mm left upper lobe lung nodule is noted, image 40/4.  Stable. 3 mm left upper lobe lung nodule is also unchanged, image 50/4. No new suspicious lung nodules. Musculoskeletal: No chest wall mass or suspicious bone lesions identified. CT ABDOMEN PELVIS FINDINGS Hepatobiliary: No suspicious liver lesions. Pancreas: Normal appearance of the pancreas Spleen: Spleen is unremarkable. Adrenals/Urinary Tract: The adrenal glands appear within normal limits. There is a suspicious lesion arising off the anterior cortex of the left kidney which measures 2.6 cm, image 33/7. Previously 2.4 cm. Previously characterize as a solid enhancing lesion. This remains suspicious for renal cell carcinoma. Hemorrhagic/proteinaceous cyst arising from the inferior pole of the right kidney is also  noted measuring 1.5 cm, image 35/7. Unchanged from previous exam. There are additional, subcentimeter low-density kidney lesions noted bilaterally which are technically too small to reliably characterize. Left-sided hydronephrosis and hydroureter is again noted. No obstructing stone identified. Stomach/Bowel: The stomach appears nondistended. The appendix is visualized and is unremarkable. No bowel wall inflammation or bowel distension identified. Vascular/Lymphatic: Aortic atherosclerosis.  No aneurysm. -Index left periaortic node measures 1.1 cm, image 59/2. Previously 1.5 cm. -The left pelvic sidewall index lymph node measures 1.1 cm, image 94/2. Previously 1.3 cm. Reproductive: Calcified uterine fibroids are identified. Similar appearance of subserosal fibroid arising off the right lateral myometrium measuring 1.9 cm, image 90/2. No adnexal mass. Other: Near complete resolution of abdominopelvic ascites with small volume of perisplenic and perihepatic ascites persisting. The omental cake has decreased in thickness compared with the previous exam. -On the left this measures 1.2 cm, image 69/2.  Previously 2.0 cm. -On the right this area measures 0.9 cm, image 69/2. Previously 2.6 cm. Previous FDG avid tumor within the umbilicus is again noted measuring 3.0 x 2.3 cm, image 81/2. Previously 4.0 x 2.8 cm. Musculoskeletal: No suspicious lytic or sclerotic bone lesions. IMPRESSION: 1. Interval response to therapy. There has been interval decrease in size of mediastinal, retrocrural, retroperitoneal, and left pelvic sidewall lymph nodes. 2. Decreased volume of ascites. Interval decrease in thickness of omental cake secondary to peritoneal disease. 3. Decrease in size of FDG avid tumor within the umbilicus. 4. Decrease in size of left upper lobe lung nodule. 5. Persistent left-sided hydronephrosis and hydroureter. No obstructing stone identified. 6. Previously noted solid enhancing lesion arising off the anterior cortex  of the inferior pole of the right kidney is again noted and remains worrisome for renal cell carcinoma. 7. Aortic atherosclerosis. Aortic Atherosclerosis (ICD10-I70.0). Electronically Signed   By: Kerby Moors M.D.   On: 07/25/2020 12:49

## 2020-07-27 NOTE — Assessment & Plan Note (Signed)
Her bowel habits are becoming regular She is aware and will make sure she does not get constipated with next cycle of treatment

## 2020-07-27 NOTE — Assessment & Plan Note (Signed)
I have reviewed multiple PET CT imaging with the patient and daughter She has excellent response to therapy Her profound, progressive weight loss is due to elimination of disease burden She has virtually no pain and majority of her prior side effects are less with each cycle of treatment I will adjust her chemotherapy today based on recent weight loss I recommend 3-4 more cycles of treatment before repeating CT imaging next time I will order pretreatment echocardiogram before her treatment in April

## 2020-07-27 NOTE — Assessment & Plan Note (Signed)
She has minimum pain She can take pain medicine as needed

## 2020-07-27 NOTE — Telephone Encounter (Signed)
-----   Message from [name], MD sent at 07/27/2020  9:05 AM EDT ----- Regarding: echo around 1st week of april Need echo repeated before her treatment in April, after next week Thanks

## 2020-07-27 NOTE — Assessment & Plan Note (Signed)
Her anemia is stable Observe closely

## 2020-07-27 NOTE — Assessment & Plan Note (Signed)
She has slight worsening hyperglycemia while on treatment We discussed the importance of dietary modification while on treatment

## 2020-08-03 MED FILL — Fosaprepitant Dimeglumine For IV Infusion 150 MG (Base Eq): INTRAVENOUS | Qty: 5 | Status: AC

## 2020-08-03 MED FILL — Dexamethasone Sodium Phosphate Inj 100 MG/10ML: INTRAMUSCULAR | Qty: 1 | Status: AC

## 2020-08-04 ENCOUNTER — Inpatient Hospital Stay: Payer: 59

## 2020-08-04 ENCOUNTER — Inpatient Hospital Stay: Payer: 59 | Attending: Gynecologic Oncology | Admitting: Medical

## 2020-08-04 ENCOUNTER — Other Ambulatory Visit: Payer: Self-pay

## 2020-08-04 ENCOUNTER — Inpatient Hospital Stay: Payer: 59 | Admitting: Nutrition

## 2020-08-04 VITALS — BP 168/79 | HR 88 | Temp 98.1°F | Resp 20 | Ht 61.0 in | Wt 211.5 lb

## 2020-08-04 DIAGNOSIS — C7800 Secondary malignant neoplasm of unspecified lung: Secondary | ICD-10-CM

## 2020-08-04 DIAGNOSIS — E119 Type 2 diabetes mellitus without complications: Secondary | ICD-10-CM | POA: Insufficient documentation

## 2020-08-04 DIAGNOSIS — C775 Secondary and unspecified malignant neoplasm of intrapelvic lymph nodes: Secondary | ICD-10-CM | POA: Insufficient documentation

## 2020-08-04 DIAGNOSIS — C541 Malignant neoplasm of endometrium: Secondary | ICD-10-CM

## 2020-08-04 DIAGNOSIS — I10 Essential (primary) hypertension: Secondary | ICD-10-CM | POA: Insufficient documentation

## 2020-08-04 DIAGNOSIS — C778 Secondary and unspecified malignant neoplasm of lymph nodes of multiple regions: Secondary | ICD-10-CM

## 2020-08-04 DIAGNOSIS — E876 Hypokalemia: Secondary | ICD-10-CM | POA: Insufficient documentation

## 2020-08-04 DIAGNOSIS — Z5111 Encounter for antineoplastic chemotherapy: Secondary | ICD-10-CM | POA: Diagnosis not present

## 2020-08-04 DIAGNOSIS — R112 Nausea with vomiting, unspecified: Secondary | ICD-10-CM | POA: Diagnosis present

## 2020-08-04 LAB — CBC WITH DIFFERENTIAL/PLATELET
Abs Immature Granulocytes: 0.02 10*3/uL (ref 0.00–0.07)
Basophils Absolute: 0 10*3/uL (ref 0.0–0.1)
Basophils Relative: 0 %
Eosinophils Absolute: 0 10*3/uL (ref 0.0–0.5)
Eosinophils Relative: 0 %
HCT: 32.4 % — ABNORMAL LOW (ref 36.0–46.0)
Hemoglobin: 10 g/dL — ABNORMAL LOW (ref 12.0–15.0)
Immature Granulocytes: 0 %
Lymphocytes Relative: 14 %
Lymphs Abs: 1.2 10*3/uL (ref 0.7–4.0)
MCH: 22.3 pg — ABNORMAL LOW (ref 26.0–34.0)
MCHC: 30.9 g/dL (ref 30.0–36.0)
MCV: 72.2 fL — ABNORMAL LOW (ref 80.0–100.0)
Monocytes Absolute: 0.9 10*3/uL (ref 0.1–1.0)
Monocytes Relative: 11 %
Neutro Abs: 6.3 10*3/uL (ref 1.7–7.7)
Neutrophils Relative %: 75 %
Platelets: 215 10*3/uL (ref 150–400)
RBC: 4.49 MIL/uL (ref 3.87–5.11)
RDW: 25 % — ABNORMAL HIGH (ref 11.5–15.5)
WBC: 8.4 10*3/uL (ref 4.0–10.5)
nRBC: 0 % (ref 0.0–0.2)

## 2020-08-04 LAB — COMPREHENSIVE METABOLIC PANEL
ALT: 12 U/L (ref 0–44)
AST: 13 U/L — ABNORMAL LOW (ref 15–41)
Albumin: 3.3 g/dL — ABNORMAL LOW (ref 3.5–5.0)
Alkaline Phosphatase: 89 U/L (ref 38–126)
Anion gap: 16 — ABNORMAL HIGH (ref 5–15)
BUN: 10 mg/dL (ref 8–23)
CO2: 36 mmol/L — ABNORMAL HIGH (ref 22–32)
Calcium: 8.5 mg/dL — ABNORMAL LOW (ref 8.9–10.3)
Chloride: 89 mmol/L — ABNORMAL LOW (ref 98–111)
Creatinine, Ser: 1.21 mg/dL — ABNORMAL HIGH (ref 0.44–1.00)
GFR, Estimated: 50 mL/min — ABNORMAL LOW (ref >60)
Glucose, Bld: 136 mg/dL — ABNORMAL HIGH (ref 70–99)
Potassium: 2.4 mmol/L — CL (ref 3.5–5.1)
Sodium: 141 mmol/L (ref 135–145)
Total Bilirubin: 0.9 mg/dL (ref 0.3–1.2)
Total Protein: 7 g/dL (ref 6.5–8.1)

## 2020-08-04 LAB — MAGNESIUM: Magnesium: 1.1 mg/dL — ABNORMAL LOW (ref 1.7–2.4)

## 2020-08-04 MED ORDER — SODIUM CHLORIDE 0.9 % IV SOLN: 6.0000 g | Freq: Once | INTRAVENOUS | Status: DC

## 2020-08-04 MED ORDER — SODIUM CHLORIDE 0.9 % IV SOLN
INTRAVENOUS | Status: DC
  Filled 2020-08-04: qty 250

## 2020-08-04 MED ORDER — POTASSIUM CHLORIDE 10 MEQ/100ML IV SOLN
10.0000 meq | INTRAVENOUS | Status: AC
  Administered 2020-08-04 (×3): 10 meq via INTRAVENOUS

## 2020-08-04 MED ORDER — POTASSIUM CHLORIDE 10 MEQ/100ML IV SOLN
INTRAVENOUS | Status: AC
  Filled 2020-08-04: qty 100

## 2020-08-04 MED ORDER — SODIUM CHLORIDE 0.9% FLUSH
10.0000 mL | Freq: Once | INTRAVENOUS | Status: AC
  Administered 2020-08-04: 10 mL
  Filled 2020-08-04: qty 10

## 2020-08-04 MED ORDER — MAGNESIUM SULFATE 2 GM/50ML IV SOLN
INTRAVENOUS | Status: AC
  Filled 2020-08-04: qty 150

## 2020-08-04 MED ORDER — ONDANSETRON HCL 4 MG/2ML IJ SOLN: 8.0000 mg | Freq: Once | INTRAMUSCULAR | Status: DC

## 2020-08-04 MED ORDER — SODIUM CHLORIDE 0.9 % IV SOLN
Freq: Once | INTRAVENOUS | Status: DC
  Filled 2020-08-04: qty 1000

## 2020-08-04 MED ORDER — MAGNESIUM SULFATE 2 GM/50ML IV SOLN: 2.0000 g | INTRAVENOUS | Status: DC

## 2020-08-04 MED ORDER — MAGNESIUM SULFATE 2 GM/50ML IV SOLN
2.0000 g | INTRAVENOUS | Status: AC
  Administered 2020-08-04 (×3): 2 g via INTRAVENOUS

## 2020-08-04 MED ORDER — SODIUM CHLORIDE 0.9 % IV SOLN
INTRAVENOUS | Status: DC
  Filled 2020-08-04 (×2): qty 1000

## 2020-08-04 MED ORDER — POTASSIUM CHLORIDE 10 MEQ/100ML IV SOLN: 10.0000 meq | INTRAVENOUS | Status: DC

## 2020-08-04 MED ORDER — HEPARIN SOD (PORK) LOCK FLUSH 100 UNIT/ML IV SOLN
500.0000 [IU] | Freq: Once | INTRAVENOUS | Status: AC
  Administered 2020-08-04: 500 [IU]
  Filled 2020-08-04: qty 5

## 2020-08-04 MED ORDER — ONDANSETRON HCL 4 MG/2ML IJ SOLN
INTRAMUSCULAR | Status: AC
  Filled 2020-08-04: qty 4

## 2020-08-04 MED ORDER — ONDANSETRON HCL 4 MG/2ML IJ SOLN
8.0000 mg | Freq: Once | INTRAMUSCULAR | Status: AC
  Administered 2020-08-04: 8 mg via INTRAVENOUS

## 2020-08-04 NOTE — Patient Instructions (Signed)
Hypokalemia Hypokalemia means that the amount of potassium in the blood is lower than normal. Potassium is a chemical (electrolyte) that helps regulate the amount of fluid in the body. It also stimulates muscle tightening (contraction) and helps nerves work properly. Normally, most of the body's potassium is inside cells, and only a very small amount is in the blood. Because the amount in the blood is so small, minor changes to potassium levels in the blood can be life-threatening. What are the causes? This condition may be caused by:  Antibiotic medicine.  Diarrhea or vomiting. Taking too much of a medicine that helps you have a bowel movement (laxative) can cause diarrhea and lead to hypokalemia.  Chronic kidney disease (CKD).  Medicines that help the body get rid of excess fluid (diuretics).  Eating disorders, such as bulimia.  Low magnesium levels in the body.  Sweating a lot. What are the signs or symptoms? Symptoms of this condition include:  Weakness.  Constipation.  Fatigue.  Muscle cramps.  Mental confusion.  Skipped heartbeats or irregular heartbeat (palpitations).  Tingling or numbness. How is this diagnosed? This condition is diagnosed with a blood test. How is this treated? This condition may be treated by:  Taking potassium supplements by mouth.  Adjusting the medicines that you take.  Eating more foods that contain a lot of potassium. If your potassium level is very low, you may need to get potassium through an IV and be monitored in the hospital. Follow these instructions at home:  Take over-the-counter and prescription medicines only as told by your health care provider. This includes vitamins and supplements.  Eat a healthy diet. A healthy diet includes fresh fruits and vegetables, whole grains, healthy fats, and lean proteins.  If instructed, eat more foods that contain a lot of potassium. This includes: ? Nuts, such as peanuts and  pistachios. ? Seeds, such as sunflower seeds and pumpkin seeds. ? Peas, lentils, and lima beans. ? Whole grain and bran cereals and breads. ? Fresh fruits and vegetables, such as apricots, avocado, bananas, cantaloupe, kiwi, oranges, tomatoes, asparagus, and potatoes. ? Orange juice. ? Tomato juice. ? Red meats. ? Yogurt.  Keep all follow-up visits as told by your health care provider. This is important.   Contact a health care provider if you:  Have weakness that gets worse.  Feel your heart pounding or racing.  Vomit.  Have diarrhea.  Have diabetes (diabetes mellitus) and you have trouble keeping your blood sugar (glucose) in your target range. Get help right away if you:  Have chest pain.  Have shortness of breath.  Have vomiting or diarrhea that lasts for more than 2 days.  Faint. Summary  Hypokalemia means that the amount of potassium in the blood is lower than normal.  This condition is diagnosed with a blood test.  Hypokalemia may be treated by taking potassium supplements, adjusting the medicines that you take, or eating more foods that are high in potassium.  If your potassium level is very low, you may need to get potassium through an IV and be monitored in the hospital. This information is not intended to replace advice given to you by your health care provider. Make sure you discuss any questions you have with your health care provider. Document Revised: 12/04/2017 Document Reviewed: 12/04/2017 Elsevier Patient Education  2021 [place]. Hypomagnesemia Hypomagnesemia is a condition in which the level of magnesium in the blood is low. Magnesium is a mineral that is found in many foods. It  is used in many different processes in the body. Hypomagnesemia can affect every organ in the body. In severe cases, it can cause life-threatening problems. What are the causes? This condition may be caused by:  Not getting enough magnesium in your  diet.  Malnutrition.  Problems with absorbing magnesium from the intestines.  Dehydration.  Alcohol abuse.  Vomiting.  Severe or chronic diarrhea.  Some medicines, including medicines that make you urinate more (diuretics).  Certain diseases, such as kidney disease, diabetes, celiac disease, and overactive thyroid. What are the signs or symptoms? Symptoms of this condition include:  Loss of appetite.  Nausea and vomiting.  Involuntary shaking or trembling of a body part (tremor).  Muscle weakness.  Tingling in the arms and legs.  Sudden tightening of muscles (muscle spasms).  Confusion.  Psychiatric issues, such as depression, irritability, or psychosis.  A feeling of fluttering of the heart.  Seizures. These symptoms are more severe if magnesium levels drop suddenly. How is this diagnosed? This condition may be diagnosed based on:  Your symptoms and medical history.  A physical exam.  Blood and urine tests. How is this treated? Treatment depends on the cause and the severity of the condition. It may be treated with:  A magnesium supplement. This can be taken in pill form. If the condition is severe, magnesium is usually given through an IV.  Changes to your diet. You may be directed to eat foods that have a lot of magnesium, such as green leafy vegetables, peas, beans, and nuts.  Stopping any intake of alcohol.   Follow these instructions at home:  Make sure that your diet includes foods with magnesium. Foods that have a lot of magnesium in them include: ? Green leafy vegetables, such as spinach and broccoli. ? Beans and peas. ? Nuts and seeds, such as almonds and sunflower seeds. ? Whole grains, such as whole grain bread and fortified cereals.  Take magnesium supplements if your health care provider tells you to do that. Take them as directed.  Take over-the-counter and prescription medicines only as told by your health care provider.  Have your  magnesium levels monitored as told by your health care provider.  When you are active, drink fluids that contain electrolytes.  Avoid drinking alcohol.  Keep all follow-up visits as told by your health care provider. This is important.      Contact a health care provider if:  You get worse instead of better.  Your symptoms return. Get help right away if you:  Develop severe muscle weakness.  Have trouble breathing.  Feel that your heart is racing. Summary  Hypomagnesemia is a condition in which the level of magnesium in the blood is low.  Hypomagnesemia can affect every organ in the body.  Treatment may include eating more foods that contain magnesium, taking magnesium supplements, and not drinking alcohol.  Have your magnesium levels monitored as told by your health care provider. This information is not intended to replace advice given to you by your health care provider. Make sure you discuss any questions you have with your health care provider. Document Revised: 09/24/2019 Document Reviewed: 09/24/2019 Elsevier Patient Education  [place].

## 2020-08-04 NOTE — Progress Notes (Signed)
Pt presents for infusion appointment. She reports that she is nauseated. Has had vomiting and diarrhea off and on at home since her last treatment. She took compazine this morning, but still nauseated. Shelia Media came to infusion to assess Ms. Bruington. Treatment held for today. IVF along with Magnesium and Potassium given per MAR. Pt states that she felt better w/ IVF.

## 2020-08-04 NOTE — Progress Notes (Signed)
Nutrition follow-up completed with patient in the infusion room while receiving fluids for metastatic endometrial cancer. Weight documented is 214.6 pounds on March 23.  This is decreased again from 226 pounds March 10.  Patient reports she has had nausea and vomiting.  She is only tolerating some fruit and a little pasta.  She has not been able to tolerate protein-containing foods.  Reports nausea medicine given today has already improved her nausea.  She wants to try a [place] sandwich.  Labs were reviewed.  Noted sodium 141, potassium 2.4, glucose 136, creatinine 1.21, and albumin 3.3.  Nutrition diagnosis: Unintended weight loss continues.  Intervention: Provided education on strategies for improving oral intake with nausea and vomiting. Reviewed high-protein foods with patient. Offered clear liquid oral nutrition supplements and provided samples.  Also gave patient coupons.  Monitoring, evaluation, goals:  Patient will increase oral intake to maintain lean body mass.  Next visit: Thursday, April 21 during infusion.  **Disclaimer: This note was dictated with voice recognition software. Similar sounding words can inadvertently be transcribed and this note may contain transcription errors which may not have been corrected upon publication of note.**

## 2020-08-04 NOTE — Progress Notes (Signed)
CRITICAL VALUE STICKER  CRITICAL VALUE: Potassium 2.4  RECEIVER (on-site recipient of call): Katheren Puller, RN   DATE & TIME NOTIFIED: 08/04/2020 @ 1128  MESSENGER (representative from lab): Pam  MD NOTIFIED: Sandi Mealy, PA-C  [place]  RESPONSE: Sandi Mealy, PA-C evaluating in infusion today, prior to treatment.  Infusion nurse will accept any new orders.

## 2020-08-04 NOTE — Patient Instructions (Signed)
Implanted Port Insertion, Care After This sheet gives you information about how to care for yourself after your procedure. Your health care provider may also give you more specific instructions. If you have problems or questions, contact your health care provider. What can I expect after the procedure? After the procedure, it is common to have:  Discomfort at the port insertion site.  Bruising on the skin over the port. This should improve over 3-4 days. Follow these instructions at home: Port care  After your port is placed, you will get a manufacturer's information card. The card has information about your port. Keep this card with you at all times.  Take care of the port as told by your health care provider. Ask your health care provider if you or a family member can get training for taking care of the port at home. A home health care nurse may also take care of the port.  Make sure to remember what type of port you have. Incision care  Follow instructions from your health care provider about how to take care of your port insertion site. Make sure you: ? Wash your hands with soap and water before and after you change your bandage (dressing). If soap and water are not available, use hand sanitizer. ? Change your dressing as told by your health care provider. ? Leave stitches (sutures), skin glue, or adhesive strips in place. These skin closures may need to stay in place for 2 weeks or longer. If adhesive strip edges start to loosen and curl up, you may trim the loose edges. Do not remove adhesive strips completely unless your health care provider tells you to do that.  Check your port insertion site every day for signs of infection. Check for: ? Redness, swelling, or pain. ? Fluid or blood. ? Warmth. ? Pus or a bad smell.      Activity  Return to your normal activities as told by your health care provider. Ask your health care provider what activities are safe for you.  Do not  lift anything that is heavier than 10 lb (4.5 kg), or the limit that you are told, until your health care provider says that it is safe. General instructions  Take over-the-counter and prescription medicines only as told by your health care provider.  Do not take baths, swim, or use a hot tub until your health care provider approves. Ask your health care provider if you may take showers. You may only be allowed to take sponge baths.  Do not drive for 24 hours if you were given a sedative during your procedure.  Wear a medical alert bracelet in case of an emergency. This will tell any health care providers that you have a port.  Keep all follow-up visits as told by your health care provider. This is important. Contact a health care provider if:  You cannot flush your port with saline as directed, or you cannot draw blood from the port.  You have a fever or chills.  You have redness, swelling, or pain around your port insertion site.  You have fluid or blood coming from your port insertion site.  Your port insertion site feels warm to the touch.  You have pus or a bad smell coming from the port insertion site. Get help right away if:  You have chest pain or shortness of breath.  You have bleeding from your port that you cannot control. Summary  Take care of the port as told by your   health care provider. Keep the manufacturer's information card with you at all times.  Change your dressing as told by your health care provider.  Contact a health care provider if you have a fever or chills or if you have redness, swelling, or pain around your port insertion site.  Keep all follow-up visits as told by your health care provider. This information is not intended to replace advice given to you by your health care provider. Make sure you discuss any questions you have with your health care provider. Document Revised: 11/19/2017 Document Reviewed: 11/19/2017 Elsevier Patient Education   2021 Elsevier Inc.  

## 2020-08-05 ENCOUNTER — Telehealth: Payer: Self-pay

## 2020-08-05 MED ORDER — POTASSIUM CHLORIDE CRYS ER 20 MEQ PO TBCR: 20.0000 meq | EXTENDED_RELEASE_TABLET | Freq: Every day | ORAL | 1 refills | Status: DC

## 2020-08-05 NOTE — Telephone Encounter (Signed)
She called and left a message to call her.  Called back. She is feeling much better today. No nausea or vomiting. Bm's normal today. She is able to eat and drink today. She feels good. Instructed to call the office back if needed.She verbalized understanding.  She is asking if she needs a earlier appt? Next appts are 4/28.

## 2020-08-05 NOTE — Progress Notes (Signed)
[facility] Progress Note   [name] 10-Mar-1956 65 y.o.  Amanda Lin. Rump is managed by Dr. [name]  Actively treated with chemotherapy/immunotherapy/hormonal therapy: yes  Current therapy: Carboplatin, paclitaxel, and Ogivri  Last treated: 07/14/2020 (cycle #3, day #1)  Next scheduled appointment with provider: 09/01/2020  Assessment: Plan:    Nausea and vomiting, intractability of vomiting not specified, unspecified vomiting type - Plan: ondansetron (ZOFRAN) injection 8 mg, DISCONTINUED: magnesium sulfate IVPB 2 g 50 mL, DISCONTINUED: potassium chloride 10 mEq in 100 mL IVPB, DISCONTINUED: 0.9 %  sodium chloride infusion  Hypomagnesemia - Plan: DISCONTINUED: magnesium sulfate 6 g in sodium chloride 0.9 % 500 mL, DISCONTINUED: magnesium sulfate IVPB 2 g 50 mL, DISCONTINUED: potassium chloride 10 mEq in 100 mL IVPB, DISCONTINUED: 0.9 %  sodium chloride infusion  Hypokalemia - Plan: potassium chloride SA (KLOR-CON) 20 MEQ tablet, DISCONTINUED: magnesium sulfate 6 g in sodium chloride 0.9 % 500 mL, DISCONTINUED: magnesium sulfate IVPB 2 g 50 mL, DISCONTINUED: potassium chloride 10 mEq in 100 mL IVPB, DISCONTINUED: 0.9 %  sodium chloride infusion  Endometrial carcinoma (HCC)   Nausea and vomiting: The patient was given Zofran 8 mg IV x1 today.  Hypomagnesemia: The patient's labs returned showing a magnesium of 1.1 today.  She was given 60 g of magnesium IV over 3 hours.  Hypokalemia: The patient was noted to have potassium level of 2.4 today.  She was given an 30 mEq of potassium chloride IV today.  A prescription for potassium chloride will also be sent to her pharmacy.    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  I have requested that scheduling shift her appointments by 1 week.  I have asked that she be seen in 1 week for consideration  of this cycle of chemotherapy.  This was held today given her hypokalemia and hypomagnesemia.   Please see After Visit Summary for patient specific instructions.  Future Appointments  Date Time Provider [place]  08/08/2020 10:00 AM [facility]- ECHO 1-RUTH [facility]  09/01/2020  9:00 AM [facility] LAB [facility] None  09/01/2020  9:15 AM [facility] [place] FLUSH [facility] None  09/01/2020  9:40 AM Alvy Bimler, Ni, MD [facility] None  09/01/2020 10:45 AM [facility] INFUSION [facility] None  09/01/2020 11:15 AM Ernestene Kiel L, RD [facility] None  09/22/2020  8:45 AM [facility] LAB [facility] None  09/22/2020  9:00 AM [facility] [place] FLUSH [facility] None  09/22/2020  9:20 AM Alvy Bimler, Ni, MD [facility] None  09/22/2020 10:15 AM [facility] INFUSION [facility] None    No orders of the defined types were placed in this encounter.      Subjective:   Patient ID:  Amanda Lin. Amanda Lin is a 65 y.o. (DOB 1955-08-07) female.  Chief Complaint: No chief complaint on file.   HPI Amanda Huettner. Lin  is a 65 y.o. 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  She is managed by Dr. [name] and is status post cycle #3, day #1 of carboplatin, paclitaxel, and Ogivri.  She presents to the clinic today for consideration of cycle #4, day #1 of therapy.  She is having nausea and vomiting.  She had diarrhea yesterday.  Her labs returned today showing a magnesium of 1.1 and a potassium of 2.4.  She denies fevers, chills, sweats, or constipation.  Her diarrhea has resolved.   Medications: I have  reviewed the patient's current medications.  Allergies: No Known Allergies  Past Medical History:  Diagnosis Date  . Diabetes mellitus without complication ([place])   . Endometrial cancer ([place])   . Hypertension     Past Surgical History:  Procedure Laterality Date  . IR IMAGING GUIDED PORT INSERTION  06/01/2020  . TUBAL LIGATION Bilateral     Family  History  Problem Relation Age of Onset  . Cancer Mother        uterine diagnosed around 39  . Prostate cancer Other     Social History   Socioeconomic History  . Marital status: Married    Spouse name: Amanda Lin  . Number of children: 2  . Years of education: Not on file  . Highest education level: Not on file  Occupational History  . Occupation: retired  Tobacco Use  . Smoking status: Never Smoker  . Smokeless tobacco: Never Used  Vaping Use  . Vaping Use: Never used  Substance and Sexual Activity  . Alcohol use: Yes    Comment: ocassionally  . Drug use: No  . Sexual activity: Yes  Other Topics Concern  . Not on file  Social History Narrative  . Not on file   Social Determinants of Health   Financial Resource Strain: Not on file  Food Insecurity: Food Insecurity Present  . Worried About Charity fundraiser in the Last Year: Often true  . Ran Out of Food in the Last Year: Sometimes true  Transportation Needs: No Transportation Needs  . Lack of Transportation (Medical): No  . Lack of Transportation (Non-Medical): No  Physical Activity: Not on file  Stress: Not on file  Social Connections: Not on file  Intimate Partner Violence: Not on file    Past Medical History, Surgical history, Social history, and Family history were reviewed and updated as appropriate.   Please see review of systems for further details on the patient's review from today.   Review of Systems:  Review of Systems  Constitutional: Negative for appetite change, chills, diaphoresis and fever.  HENT: Negative for trouble swallowing.   Respiratory: Negative for cough, choking, chest tightness, shortness of breath and wheezing.   Cardiovascular: Negative for chest pain, palpitations and leg swelling.  Gastrointestinal: Positive for diarrhea, nausea and vomiting. Negative for abdominal distention, abdominal pain, blood in stool and constipation.  Genitourinary: Negative for decreased urine volume and  difficulty urinating.  Neurological: Positive for weakness. Negative for headaches.    Objective:   Physical Exam:  There were no vitals taken for this visit. ECOG: 1  Physical Exam Constitutional:      General: She is not in acute distress.    Appearance: She is not diaphoretic.  HENT:     Head: Normocephalic and atraumatic.  Eyes:     General: No scleral icterus.       Right eye: No discharge.        Left eye: No discharge.     Conjunctiva/sclera: Conjunctivae normal.  Cardiovascular:     Rate and Rhythm: Normal rate and regular rhythm.     Heart sounds: Normal heart sounds. No murmur heard. No friction rub. No gallop.   Pulmonary:     Effort: Pulmonary effort is normal. No respiratory distress.     Breath sounds: Normal breath sounds. No wheezing or rales.  Abdominal:     General: Bowel sounds are normal. There is no distension.     Tenderness: There is no abdominal tenderness. There is no  guarding.  Skin:    General: Skin is warm and dry.     Findings: No erythema or rash.  Neurological:     Mental Status: She is alert.     Coordination: Coordination normal.  Psychiatric:        Mood and Affect: Mood normal.        Behavior: Behavior normal.        Thought Content: Thought content normal.        Judgment: Judgment normal.     Lab Review:     Component Value Date/Time   NA 141 08/04/2020 1049   NA 138 02/23/2020 1113   K 2.4 (LL) 08/04/2020 1049   CL 89 (L) 08/04/2020 1049   CO2 36 (H) 08/04/2020 1049   GLUCOSE 136 (H) 08/04/2020 1049   BUN 10 08/04/2020 1049   BUN 15 02/23/2020 1113   CREATININE 1.21 (H) 08/04/2020 1049   CALCIUM 8.5 (L) 08/04/2020 1049   PROT 7.0 08/04/2020 1049   PROT 7.8 02/23/2020 1113   ALBUMIN 3.3 (L) 08/04/2020 1049   ALBUMIN 4.4 02/23/2020 1113   AST 13 (L) 08/04/2020 1049   ALT 12 08/04/2020 1049   ALKPHOS 89 08/04/2020 1049   BILITOT 0.9 08/04/2020 1049   BILITOT 0.6 02/23/2020 1113   GFRNONAA 50 (L) 08/04/2020 1049    GFRAA 91 02/23/2020 1113       Component Value Date/Time   WBC 8.4 08/04/2020 1049   RBC 4.49 08/04/2020 1049   HGB 10.0 (L) 08/04/2020 1049   HGB 11.6 04/25/2020 0948   HCT 32.4 (L) 08/04/2020 1049   HCT 37.1 04/25/2020 0948   PLT 215 08/04/2020 1049   PLT 555 (H) 04/25/2020 0948   MCV 72.2 (L) 08/04/2020 1049   MCV 81 04/25/2020 0948   MCH 22.3 (L) 08/04/2020 1049   MCHC 30.9 08/04/2020 1049   RDW 25.0 (H) 08/04/2020 1049   RDW 13.1 04/25/2020 0948   LYMPHSABS 1.2 08/04/2020 1049   LYMPHSABS 1.7 02/23/2020 1113   MONOABS 0.9 08/04/2020 1049   EOSABS 0.0 08/04/2020 1049   EOSABS 0.1 02/23/2020 1113   BASOSABS 0.0 08/04/2020 1049   BASOSABS 0.0 02/23/2020 1113   -------------------------------  Imaging from last 24 hours (if applicable):  Radiology interpretation: CT CHEST ABDOMEN PELVIS W CONTRAST  Result Date: 07/25/2020 CLINICAL DATA:  Restaging uterine/cervical cancer. EXAM: CT CHEST, ABDOMEN, AND PELVIS WITH CONTRAST TECHNIQUE: Multidetector CT imaging of the chest, abdomen and pelvis was performed following the standard protocol during bolus administration of intravenous contrast. CONTRAST:  143mL OMNIPAQUE IOHEXOL 300 MG/ML  SOLN COMPARISON:  PET-CT 05/30/2020. FINDINGS: CT CHEST FINDINGS Cardiovascular: The heart size appears within normal limits. No pericardial effusion. Mild aortic atherosclerosis. Mediastinum/Nodes: Normal appearance of the thyroid gland. The trachea appears patent and is midline. Normal appearance of the esophagus. Stable 8 mm left supraclavicular lymph node, image 4/2. The left pre-vascular lymph node in the superior mediastinum measures 0.7 cm, image 7/2. Previously 1 cm. Low right paratracheal node at the carina measures 0.7 cm, image 19/2. Previously 1 cm. Index right retrocrural lymph node measures 5 mm, image 44/2. Previously 10 mm. Lungs/Pleura: No pleural effusion. Lingular nodule measures 0.7 cm, image 67/4.  Previously 1.2 cm. Chronic, small  nodular density within the medial right lung base is unchanged and is favored to represent an area of chronic distal airway impaction, image 93/4. 4 mm left upper lobe lung nodule is noted, image 40/4.  Stable. 3 mm left upper lobe lung  nodule is also unchanged, image 50/4. No new suspicious lung nodules. Musculoskeletal: No chest wall mass or suspicious bone lesions identified. CT ABDOMEN PELVIS FINDINGS Hepatobiliary: No suspicious liver lesions. Pancreas: Normal appearance of the pancreas Spleen: Spleen is unremarkable. Adrenals/Urinary Tract: The adrenal glands appear within normal limits. There is a suspicious lesion arising off the anterior cortex of the left kidney which measures 2.6 cm, image 33/7. Previously 2.4 cm. Previously characterize as a solid enhancing lesion. This remains suspicious for renal cell carcinoma. Hemorrhagic/proteinaceous cyst arising from the inferior pole of the right kidney is also noted measuring 1.5 cm, image 35/7. Unchanged from previous exam. There are additional, subcentimeter low-density kidney lesions noted bilaterally which are technically too small to reliably characterize. Left-sided hydronephrosis and hydroureter is again noted. No obstructing stone identified. Stomach/Bowel: The stomach appears nondistended. The appendix is visualized and is unremarkable. No bowel wall inflammation or bowel distension identified. Vascular/Lymphatic: Aortic atherosclerosis.  No aneurysm. -Index left periaortic node measures 1.1 cm, image 59/2. Previously 1.5 cm. -The left pelvic sidewall index lymph node measures 1.1 cm, image 94/2. Previously 1.3 cm. Reproductive: Calcified uterine fibroids are identified. Similar appearance of subserosal fibroid arising off the right lateral myometrium measuring 1.9 cm, image 90/2. No adnexal mass. Other: Near complete resolution of abdominopelvic ascites with small volume of perisplenic and perihepatic ascites persisting. The omental cake has decreased  in thickness compared with the previous exam. -On the left this measures 1.2 cm, image 69/2.  Previously 2.0 cm. -On the right this area measures 0.9 cm, image 69/2. Previously 2.6 cm. Previous FDG avid tumor within the umbilicus is again noted measuring 3.0 x 2.3 cm, image 81/2. Previously 4.0 x 2.8 cm. Musculoskeletal: No suspicious lytic or sclerotic bone lesions. IMPRESSION: 1. Interval response to therapy. There has been interval decrease in size of mediastinal, retrocrural, retroperitoneal, and left pelvic sidewall lymph nodes. 2. Decreased volume of ascites. Interval decrease in thickness of omental cake secondary to peritoneal disease. 3. Decrease in size of FDG avid tumor within the umbilicus. 4. Decrease in size of left upper lobe lung nodule. 5. Persistent left-sided hydronephrosis and hydroureter. No obstructing stone identified. 6. Previously noted solid enhancing lesion arising off the anterior cortex of the inferior pole of the right kidney is again noted and remains worrisome for renal cell carcinoma. 7. Aortic atherosclerosis. Aortic Atherosclerosis (ICD10-I70.0). Electronically Signed   By: Kerby Moors M.D.   On: 07/25/2020 12:49

## 2020-08-08 ENCOUNTER — Telehealth: Payer: Self-pay | Admitting: Medical

## 2020-08-08 ENCOUNTER — Other Ambulatory Visit: Payer: Self-pay

## 2020-08-08 ENCOUNTER — Ambulatory Visit (HOSPITAL_COMMUNITY)
Admission: RE | Admit: 2020-08-08 | Discharge: 2020-08-08 | Disposition: A | Discharge status: Home / Self Care | Payer: 59 | Source: Ambulatory Visit | Attending: Hematology and Oncology | Admitting: Hematology and Oncology

## 2020-08-08 DIAGNOSIS — E119 Type 2 diabetes mellitus without complications: Secondary | ICD-10-CM | POA: Insufficient documentation

## 2020-08-08 DIAGNOSIS — I10 Essential (primary) hypertension: Secondary | ICD-10-CM | POA: Insufficient documentation

## 2020-08-08 DIAGNOSIS — C541 Malignant neoplasm of endometrium: Secondary | ICD-10-CM | POA: Diagnosis not present

## 2020-08-08 DIAGNOSIS — Z0189 Encounter for other specified special examinations: Secondary | ICD-10-CM | POA: Diagnosis not present

## 2020-08-08 LAB — ECHOCARDIOGRAM COMPLETE
Area-P 1/2: 3.37 cm2
S' Lateral: 2.2 cm

## 2020-08-08 NOTE — Progress Notes (Signed)
  Echocardiogram 2D Echocardiogram with strain has been performed.  Darlina Sicilian M 08/08/2020, 11:40 AM

## 2020-08-08 NOTE — Telephone Encounter (Signed)
Patient scheduled for 4/12 for Labs/Flush/MD/Infusion after review with MD.    Patient has been notified of appointments.

## 2020-08-08 NOTE — Telephone Encounter (Signed)
Hi Amanda Lin,  I was gone and realized now her Rx was cancelled because she was profoundly low in Mg and K Because she gets long infusion, nobody has worked on the rescheduling Can you ask if she is willing to get treatment at [facility]? I do not see any possible ways for them to add her on this week or next

## 2020-08-08 NOTE — Telephone Encounter (Signed)
Scheduled appts per 3/31 sch msg. Called pt, no answer. Left msg with appts date and times.

## 2020-08-10 ENCOUNTER — Telehealth: Payer: Self-pay

## 2020-08-10 NOTE — Telephone Encounter (Signed)
Spoke with pt to confirm her appt on 08/11/20 beginning at 0730. Pt verbalizes understanding and states that she will call transportation to schedule and appropriate ride.

## 2020-08-10 NOTE — Telephone Encounter (Signed)
-----   Message from [name], MD sent at 08/10/2020  8:49 AM EDT ----- Regarding: appt We have her scheduled twice, once tomorrow and another next week I suggest you call and confirm she is coming tomorrow, do not cancel her appt next week until she showed up

## 2020-08-11 ENCOUNTER — Other Ambulatory Visit: Payer: Self-pay

## 2020-08-11 ENCOUNTER — Inpatient Hospital Stay: Payer: 59

## 2020-08-11 ENCOUNTER — Other Ambulatory Visit: Payer: Self-pay | Admitting: Hematology and Oncology

## 2020-08-11 ENCOUNTER — Encounter: Payer: Self-pay | Admitting: Hematology and Oncology

## 2020-08-11 ENCOUNTER — Inpatient Hospital Stay: Payer: 59 | Attending: Gynecologic Oncology

## 2020-08-11 ENCOUNTER — Inpatient Hospital Stay (HOSPITAL_BASED_OUTPATIENT_CLINIC_OR_DEPARTMENT_OTHER): Payer: 59 | Admitting: Hematology and Oncology

## 2020-08-11 VITALS — BP 160/80 | HR 94 | Temp 98.2°F | Resp 18 | Wt 210.4 lb

## 2020-08-11 DIAGNOSIS — C778 Secondary and unspecified malignant neoplasm of lymph nodes of multiple regions: Secondary | ICD-10-CM

## 2020-08-11 DIAGNOSIS — D509 Iron deficiency anemia, unspecified: Secondary | ICD-10-CM | POA: Insufficient documentation

## 2020-08-11 DIAGNOSIS — C541 Malignant neoplasm of endometrium: Secondary | ICD-10-CM | POA: Diagnosis present

## 2020-08-11 DIAGNOSIS — C7800 Secondary malignant neoplasm of unspecified lung: Secondary | ICD-10-CM

## 2020-08-11 DIAGNOSIS — E876 Hypokalemia: Secondary | ICD-10-CM

## 2020-08-11 DIAGNOSIS — K5909 Other constipation: Secondary | ICD-10-CM

## 2020-08-11 DIAGNOSIS — D539 Nutritional anemia, unspecified: Secondary | ICD-10-CM | POA: Diagnosis not present

## 2020-08-11 DIAGNOSIS — I10 Essential (primary) hypertension: Secondary | ICD-10-CM | POA: Diagnosis not present

## 2020-08-11 DIAGNOSIS — Z5111 Encounter for antineoplastic chemotherapy: Secondary | ICD-10-CM | POA: Diagnosis not present

## 2020-08-11 DIAGNOSIS — R112 Nausea with vomiting, unspecified: Secondary | ICD-10-CM

## 2020-08-11 DIAGNOSIS — D61818 Other pancytopenia: Secondary | ICD-10-CM | POA: Diagnosis not present

## 2020-08-11 DIAGNOSIS — Z79899 Other long term (current) drug therapy: Secondary | ICD-10-CM | POA: Diagnosis not present

## 2020-08-11 LAB — COMPREHENSIVE METABOLIC PANEL
ALT: 8 U/L (ref 0–44)
AST: 11 U/L — ABNORMAL LOW (ref 15–41)
Albumin: 3.2 g/dL — ABNORMAL LOW (ref 3.5–5.0)
Alkaline Phosphatase: 88 U/L (ref 38–126)
Anion gap: 15 (ref 5–15)
BUN: 7 mg/dL — ABNORMAL LOW (ref 8–23)
CO2: 31 mmol/L (ref 22–32)
Calcium: 8.6 mg/dL — ABNORMAL LOW (ref 8.9–10.3)
Chloride: 94 mmol/L — ABNORMAL LOW (ref 98–111)
Creatinine, Ser: 0.99 mg/dL (ref 0.44–1.00)
GFR, Estimated: >60 mL/min (ref >60)
Glucose, Bld: 123 mg/dL — ABNORMAL HIGH (ref 70–99)
Potassium: 2.6 mmol/L — CL (ref 3.5–5.1)
Sodium: 140 mmol/L (ref 135–145)
Total Bilirubin: 0.7 mg/dL (ref 0.3–1.2)
Total Protein: 6.9 g/dL (ref 6.5–8.1)

## 2020-08-11 LAB — CBC WITH DIFFERENTIAL/PLATELET
Abs Immature Granulocytes: 0.02 10*3/uL (ref 0.00–0.07)
Basophils Absolute: 0 10*3/uL (ref 0.0–0.1)
Basophils Relative: 0 %
Eosinophils Absolute: 0 10*3/uL (ref 0.0–0.5)
Eosinophils Relative: 0 %
HCT: 29.5 % — ABNORMAL LOW (ref 36.0–46.0)
Hemoglobin: 9.4 g/dL — ABNORMAL LOW (ref 12.0–15.0)
Immature Granulocytes: 0 %
Lymphocytes Relative: 21 %
Lymphs Abs: 1 10*3/uL (ref 0.7–4.0)
MCH: 22.9 pg — ABNORMAL LOW (ref 26.0–34.0)
MCHC: 31.9 g/dL (ref 30.0–36.0)
MCV: 71.8 fL — ABNORMAL LOW (ref 80.0–100.0)
Monocytes Absolute: 0.7 10*3/uL (ref 0.1–1.0)
Monocytes Relative: 14 %
Neutro Abs: 3.1 10*3/uL (ref 1.7–7.7)
Neutrophils Relative %: 65 %
Platelets: 289 10*3/uL (ref 150–400)
RBC: 4.11 MIL/uL (ref 3.87–5.11)
RDW: 26.6 % — ABNORMAL HIGH (ref 11.5–15.5)
WBC: 4.8 10*3/uL (ref 4.0–10.5)
nRBC: 0 % (ref 0.0–0.2)

## 2020-08-11 LAB — MAGNESIUM: Magnesium: 1.4 mg/dL — ABNORMAL LOW (ref 1.7–2.4)

## 2020-08-11 MED ORDER — MAGNESIUM SULFATE 2 GM/50ML IV SOLN
2.0000 g | Freq: Once | INTRAVENOUS | Status: AC
Start: 2020-08-11 — End: 2020-08-11
  Administered 2020-08-11: 2 g via INTRAVENOUS

## 2020-08-11 MED ORDER — PALONOSETRON HCL INJECTION 0.25 MG/5ML
0.2500 mg | Freq: Once | INTRAVENOUS | Status: AC
  Administered 2020-08-11: 0.25 mg via INTRAVENOUS

## 2020-08-11 MED ORDER — MAGNESIUM OXIDE 400 (241.3 MG) MG PO TABS: 400.0000 mg | ORAL_TABLET | Freq: Every day | ORAL | 1 refills | Status: DC

## 2020-08-11 MED ORDER — SODIUM CHLORIDE 0.9% FLUSH
10.0000 mL | Freq: Once | INTRAVENOUS | Status: AC
  Administered 2020-08-11: 10 mL
  Filled 2020-08-11: qty 10

## 2020-08-11 MED ORDER — SODIUM CHLORIDE 0.9 % IV SOLN
222.0000 mg | Freq: Once | INTRAVENOUS | Status: AC
  Administered 2020-08-11: 222 mg via INTRAVENOUS
  Filled 2020-08-11: qty 37

## 2020-08-11 MED ORDER — SODIUM CHLORIDE 0.9 % IV SOLN
10.0000 mg | Freq: Once | INTRAVENOUS | Status: AC
  Administered 2020-08-11: 10 mg via INTRAVENOUS
  Filled 2020-08-11: qty 10

## 2020-08-11 MED ORDER — SODIUM CHLORIDE 0.9 % IV SOLN
Freq: Once | INTRAVENOUS | Status: AC
Start: 2020-08-11 — End: 2020-08-11
  Filled 2020-08-11: qty 250

## 2020-08-11 MED ORDER — DIPHENHYDRAMINE HCL 50 MG/ML IJ SOLN
25.0000 mg | Freq: Once | INTRAMUSCULAR | Status: AC
  Administered 2020-08-11: 25 mg via INTRAVENOUS

## 2020-08-11 MED ORDER — DIPHENHYDRAMINE HCL 50 MG/ML IJ SOLN
INTRAMUSCULAR | Status: AC
  Filled 2020-08-11: qty 1

## 2020-08-11 MED ORDER — MAGNESIUM SULFATE 2 GM/50ML IV SOLN
INTRAVENOUS | Status: AC
  Filled 2020-08-11: qty 50

## 2020-08-11 MED ORDER — SODIUM CHLORIDE 0.9 % IV SOLN
150.0000 mg | Freq: Once | INTRAVENOUS | Status: AC
  Administered 2020-08-11: 150 mg via INTRAVENOUS
  Filled 2020-08-11: qty 150

## 2020-08-11 MED ORDER — FAMOTIDINE IN NACL 20-0.9 MG/50ML-% IV SOLN
INTRAVENOUS | Status: AC
  Filled 2020-08-11: qty 50

## 2020-08-11 MED ORDER — ACETAMINOPHEN 325 MG PO TABS
ORAL_TABLET | ORAL | Status: AC
  Filled 2020-08-11: qty 2

## 2020-08-11 MED ORDER — HEPARIN SOD (PORK) LOCK FLUSH 100 UNIT/ML IV SOLN
500.0000 [IU] | Freq: Once | INTRAVENOUS | Status: AC | PRN
  Administered 2020-08-11: 500 [IU]
  Filled 2020-08-11: qty 5

## 2020-08-11 MED ORDER — SODIUM CHLORIDE 0.9 % IV SOLN
566.0000 mg | Freq: Once | INTRAVENOUS | Status: AC
  Administered 2020-08-11: 570 mg via INTRAVENOUS
  Filled 2020-08-11: qty 57

## 2020-08-11 MED ORDER — FAMOTIDINE IN NACL 20-0.9 MG/50ML-% IV SOLN
20.0000 mg | Freq: Once | INTRAVENOUS | Status: AC
Start: 2020-08-11 — End: 2020-08-11
  Administered 2020-08-11: 20 mg via INTRAVENOUS

## 2020-08-11 MED ORDER — SODIUM CHLORIDE 0.9% FLUSH
10.0000 mL | INTRAVENOUS | Status: DC | PRN
  Administered 2020-08-11: 10 mL
  Filled 2020-08-11: qty 10

## 2020-08-11 MED ORDER — TRASTUZUMAB-DKST CHEMO 150 MG IV SOLR
600.0000 mg | Freq: Once | INTRAVENOUS | Status: AC
  Administered 2020-08-11: 600 mg via INTRAVENOUS
  Filled 2020-08-11: qty 28.57

## 2020-08-11 MED ORDER — ACETAMINOPHEN 325 MG PO TABS
650.0000 mg | ORAL_TABLET | Freq: Once | ORAL | Status: AC
  Administered 2020-08-11: 650 mg via ORAL

## 2020-08-11 NOTE — Assessment & Plan Note (Signed)
She felt that this is related to poor choices of food recently She will continue antiemetics as needed

## 2020-08-11 NOTE — Assessment & Plan Note (Signed)
She is anemic I plan to schedule IV iron in her next visit She does not need blood transfusion support

## 2020-08-11 NOTE — Assessment & Plan Note (Signed)
She will continue oral potassium replacement therapy

## 2020-08-11 NOTE — Assessment & Plan Note (Signed)
I recommend IV magnesium and oral magnesium replacement therapy If this is not adequate, we will proceed with more aggressive electrolyte replacement therapy next week

## 2020-08-11 NOTE — Progress Notes (Signed)
CRITICAL VALUE STICKER  CRITICAL VALUE: Potassium 2.6  RECEIVER (on-site recipient of call): Harrel Lemon, RN  [place] NOTIFIED: [phone] 08/11/20  MESSENGER (representative from lab): M. Nicole Kindred  MD NOTIFIED: Dr. Alvy Bimler  TIME OF NOTIFICATION:0844 08/11/20  RESPONSE: Proceed with treatment today.

## 2020-08-11 NOTE — Patient Instructions (Signed)
[facility] Discharge Instructions for Patients Receiving Chemotherapy  Today you received the following chemotherapy agents Traztuzumab, Paclitaxel, Carboplatin.  To help prevent nausea and vomiting after your treatment, we encourage you to take your nausea medication as directed.   If you develop nausea and vomiting that is not controlled by your nausea medication, call the clinic.   BELOW ARE SYMPTOMS THAT SHOULD BE REPORTED IMMEDIATELY:  *FEVER GREATER THAN 100.5 F  *CHILLS WITH OR WITHOUT FEVER  NAUSEA AND VOMITING THAT IS NOT CONTROLLED WITH YOUR NAUSEA MEDICATION  *UNUSUAL SHORTNESS OF BREATH  *UNUSUAL BRUISING OR BLEEDING  TENDERNESS IN MOUTH AND THROAT WITH OR WITHOUT PRESENCE OF ULCERS  *URINARY PROBLEMS  *BOWEL PROBLEMS  UNUSUAL RASH Items with * indicate a potential emergency and should be followed up as soon as possible.  Feel free to call the clinic should you have any questions or concerns. The clinic phone number is [phone].  Please show the [facility] at check-in to the Emergency Department and triage nurse.

## 2020-08-11 NOTE — Patient Instructions (Signed)
Implanted Port Insertion, Care After This sheet gives you information about how to care for yourself after your procedure. Your health care provider may also give you more specific instructions. If you have problems or questions, contact your health care provider. What can I expect after the procedure? After the procedure, it is common to have:  Discomfort at the port insertion site.  Bruising on the skin over the port. This should improve over 3-4 days. Follow these instructions at home: Port care  After your port is placed, you will get a manufacturer's information card. The card has information about your port. Keep this card with you at all times.  Take care of the port as told by your health care provider. Ask your health care provider if you or a family member can get training for taking care of the port at home. A home health care nurse may also take care of the port.  Make sure to remember what type of port you have. Incision care  Follow instructions from your health care provider about how to take care of your port insertion site. Make sure you: ? Wash your hands with soap and water before and after you change your bandage (dressing). If soap and water are not available, use hand sanitizer. ? Change your dressing as told by your health care provider. ? Leave stitches (sutures), skin glue, or adhesive strips in place. These skin closures may need to stay in place for 2 weeks or longer. If adhesive strip edges start to loosen and curl up, you may trim the loose edges. Do not remove adhesive strips completely unless your health care provider tells you to do that.  Check your port insertion site every day for signs of infection. Check for: ? Redness, swelling, or pain. ? Fluid or blood. ? Warmth. ? Pus or a bad smell.      Activity  Return to your normal activities as told by your health care provider. Ask your health care provider what activities are safe for you.  Do not  lift anything that is heavier than 10 lb (4.5 kg), or the limit that you are told, until your health care provider says that it is safe. General instructions  Take over-the-counter and prescription medicines only as told by your health care provider.  Do not take baths, swim, or use a hot tub until your health care provider approves. Ask your health care provider if you may take showers. You may only be allowed to take sponge baths.  Do not drive for 24 hours if you were given a sedative during your procedure.  Wear a medical alert bracelet in case of an emergency. This will tell any health care providers that you have a port.  Keep all follow-up visits as told by your health care provider. This is important. Contact a health care provider if:  You cannot flush your port with saline as directed, or you cannot draw blood from the port.  You have a fever or chills.  You have redness, swelling, or pain around your port insertion site.  You have fluid or blood coming from your port insertion site.  Your port insertion site feels warm to the touch.  You have pus or a bad smell coming from the port insertion site. Get help right away if:  You have chest pain or shortness of breath.  You have bleeding from your port that you cannot control. Summary  Take care of the port as told by your   health care provider. Keep the manufacturer's information card with you at all times.  Change your dressing as told by your health care provider.  Contact a health care provider if you have a fever or chills or if you have redness, swelling, or pain around your port insertion site.  Keep all follow-up visits as told by your health care provider. This information is not intended to replace advice given to you by your health care provider. Make sure you discuss any questions you have with your health care provider. Document Revised: 11/19/2017 Document Reviewed: 11/19/2017 Elsevier Patient Education   2021 Elsevier Inc.  

## 2020-08-11 NOTE — Assessment & Plan Note (Signed)
Overall, she tolerated treatment fairly well except for electrolyte imbalances, occasional nausea, anemia and constipation We will proceed with treatment as scheduled She will get additional IV magnesium I plan to see her again next week for further follow-up and further electrolyte replacement therapy

## 2020-08-11 NOTE — Assessment & Plan Note (Signed)
We discussed the importance of aggressive laxative therapy 

## 2020-08-11 NOTE — Progress Notes (Signed)
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[place]) Overall, she tolerated treatment fairly well except for electrolyte imbalances, occasional nausea, anemia and constipation We will proceed with treatment as scheduled She will get additional IV magnesium I plan to see her again next week for further follow-up and further electrolyte replacement therapy  Deficiency anemia She is anemic I plan to schedule IV iron in her next visit She does not need blood transfusion support  Other constipation We discussed the importance of aggressive laxative therapy  Hypomagnesemia I recommend IV magnesium and oral magnesium replacement therapy If this is not adequate, we will proceed with more aggressive electrolyte replacement therapy next week  Hypokalemia, inadequate intake She will continue oral potassium replacement therapy  Nausea with vomiting She felt that this is related to poor choices of food recently She will continue antiemetics as needed   No orders of the defined types were placed in this encounter.   All questions were answered. The patient knows to call the clinic with any problems, questions or concerns. The total time spent in the appointment was 40 minutes encounter with patients including review of chart and various tests results, discussions about plan of care and coordination of care plan   Amanda Lark, MD 08/11/2020 10:06 AM  INTERVAL HISTORY: Please see below for problem oriented charting. 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     02/26/2020 Imaging   1. 3 mm nonobstructing renal stone within the left kidney. 2. Findings suggestive of a small hemorrhagic cyst within the lower pole of the right kidney, with an anterior right lower pole heterogeneous renal soft tissue mass. MRI correlation is recommended, as an underlying neoplastic process cannot be excluded. 3. Colonic diverticulosis. 4. Multiple exophytic uterine fibroids. 5. Evidence of prior cholecystectomy.   03/28/2020 Imaging   2.4 cm enhancing mass in the anterior right lower kidney, suspicious for solid renal neoplasm such as renal cell carcinoma.   Single right renal artery and vein.  No renal vein invasion.   Small retroperitoneal nodes, measuring up to 9 mm short axis, mildly prominent but technically within the upper limits of normal. Attention on follow-up is suggested.   4 x 5 mm nodule in the medial right lower lobe. This is nonspecific and unlikely to reflect a metastasis but warrants attention on follow-up. Consider dedicated CT chest in 3-6 months.   3 mm nonobstructing left lower pole renal calculus. No hydronephrosis.     04/04/2020 Imaging   1. 10 mm endometrial stripe thickness. This is considered abnormal in a postmenopausal female with bleeding. In the setting of post-menopausal bleeding, endometrial sampling is indicated to exclude carcinoma.  2. Multiple uterine fibroids including potential submucosal fibroid in the lower uterine segment.   04/19/2020 Pathology Results   A. ENDOMETRIUM, BIOPSY:  -  High-grade carcinoma  Immunohistochemical and morphometric analysis performed manually   The tumor cells are POSITIVE for Her2 (3+).    04/19/2020 Procedure    She was seen by Dr. Astrid Drafts on 04/19/2020.  At that visit her cervix was noted to be nodular and firm and abnormal appearing.  Pap taken at that visit was positive for adenocarcinoma, endometrial.  An endometrial [place] biopsy was taken at the same visit which revealed high-grade endometrial cancer, suspicious for serous carcinoma.     05/12/2020 Cancer Staging   Staging form: Corpus Uteri - Carcinoma and Carcinosarcoma, AJCC 8th Edition - Clinical stage from 05/12/2020: FIGO Stage IVB (cT3, cN2, cM1) - Signed by Amanda Lark, MD on 05/31/2020   05/30/2020 PET scan   1. Hypermetabolic lymph nodes identified in the left supraclavicular region/thoracic inlet, mediastinum, hilar regions, right retrocrural space, para-aortic retroperitoneum, and bilateral pelvic sidewall, compatible with metastatic disease. 2. Moderate volume ascites on this noncontrast CT study largely obscures peritoneal and omental disease although hypermetabolic plaque-like uptake in the anterior high abdomen is probably peritoneal with relatively bulky hypermetabolism in the omentum. These findings are also compatible with metastatic disease. 3. Hypermetabolic left upper lobe pulmonary nodule consistent with metastatic disease. Lung primary is considered less likely but not excluded. 4. Scattered areas of focal marrow uptake are suspicious for bony metastases although no underlying abnormality is evident on CT imaging. 5. Diffuse uptake in the uterine anatomy compatible with the patient's known history of endometrial carcinoma. 6. As above, moderate volume ascites is new in the interval. 7. Enhancing lesion lower pole right kidney seen on previous CT of 03/28/2020 is less conspicuous on today's noncontrast CT imaging. Diffuse normal background FDG accumulation could obscure hypermetabolism in this lesion previously characterized as suspicious for renal cell carcinoma. 8. Bibasilar collapse/consolidation in the lower lobes.   06/01/2020  Procedure   Ultrasound and fluoroscopically guided right internal jugular single lumen power port catheter insertion. Tip in the SVC/RA junction. Catheter ready for use.   06/02/2020 Echocardiogram    1. GLS -18.2%.  2. Left ventricular ejection fraction, by estimation, is 60 to 65%. The left ventricle has normal function. The left ventricle has no regional wall motion abnormalities. There is mild left ventricular hypertrophy. Left ventricular diastolic parameters are consistent with Grade I diastolic dysfunction (impaired relaxation).  3. Right ventricular systolic function is normal. The right ventricular size is normal.  4. The mitral valve is normal in structure. No evidence of mitral valve regurgitation. No evidence of mitral stenosis.  5. The aortic valve is tricuspid. Aortic valve regurgitation is not visualized. No aortic stenosis is present.  6. The inferior vena cava is normal in size with greater than 50% respiratory variability, suggesting right atrial pressure of 3 mmHg.     06/03/2020 -  Chemotherapy    Patient is on Treatment Plan: UTERINE SEROUS CARCINOMA CARBOPLATIN + PACLITAXEL + TRASTUZUMAB Q21D X 6 CYCLES / TRASTUZUMAB Q21D      07/25/2020 Imaging   1. Interval response to therapy. There has been interval decrease in size of mediastinal, retrocrural, retroperitoneal, and left pelvic sidewall lymph nodes. 2. Decreased volume of ascites. Interval decrease in thickness of omental cake secondary to peritoneal disease. 3. Decrease in size of FDG avid tumor within the umbilicus. 4. Decrease in size of left upper lobe lung nodule. 5. Persistent left-sided hydronephrosis and hydroureter. No obstructing stone identified. 6. Previously noted solid enhancing lesion arising off the anterior cortex of the inferior pole of the right kidney is again noted  and remains worrisome for renal cell carcinoma. 7. Aortic atherosclerosis.     08/08/2020 Echocardiogram    1. Left ventricular  ejection fraction, by estimation, is 65 to 70%. The left ventricle has normal function. The left ventricle has no regional wall motion abnormalities. There is mild concentric left ventricular hypertrophy of the basal-septal segment. Left ventricular diastolic parameters are consistent with Grade I diastolic dysfunction (impaired relaxation). The average left ventricular global longitudinal strain is -18.5 %. The global longitudinal strain is normal.  2. Right ventricular systolic function is normal. The right ventricular size is normal.  3. The mitral valve is normal in structure. No evidence of mitral valve regurgitation.  4. The aortic valve is tricuspid. Aortic valve regurgitation is not visualized. No aortic stenosis is present.  5. The inferior vena cava is normal in size with greater than 50% respiratory variability, suggesting right atrial pressure of 3 mmHg.   Comparison(s): A prior study was performed on 06/02/2020. No significant change from prior study. Prior images reviewed side by side. Similar LV deformation and strain and improved loading conditions (improved blood pressure).   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[employer])  05/31/2020 Initial Diagnosis   Metastasis to lung ([place])   06/03/2020 -  Chemotherapy    Patient is on Treatment Plan: UTERINE SEROUS CARCINOMA CARBOPLATIN + PACLITAXEL + TRASTUZUMAB Q21D X 6 CYCLES / TRASTUZUMAB Q21D        REVIEW OF SYSTEMS:   Constitutional: Denies fevers, chills  Eyes: Denies blurriness of vision Ears, nose, mouth, throat, and face: Denies mucositis or sore throat Respiratory: Denies cough, dyspnea or wheezes Cardiovascular: Denies palpitation, chest discomfort or lower extremity swelling Skin:  Denies abnormal skin rashes Lymphatics: Denies new lymphadenopathy or easy bruising Neurological:Denies numbness, tingling or new weaknesses Behavioral/Psych: Mood is stable, no new changes  All other systems were reviewed with the patient and are negative.  I have reviewed the past medical history, past surgical history, social history and family history with the patient and they are unchanged from previous note.  ALLERGIES:  has No Known Allergies.  MEDICATIONS:  Current Outpatient Medications  Medication Sig Dispense Refill  . magnesium oxide (MAG-OX) 400 (241.3 Mg) MG tablet Take 1 tablet (400 mg total) by mouth daily. 30 tablet 1  . amLODipine (NORVASC) 10 MG tablet Take 1 tablet (10 mg total) by mouth daily. 30 tablet 1  . dexamethasone (DECADRON) 4 MG tablet Take 2 tabs at the night before and 2 tab the morning of chemotherapy, every 3 weeks, by mouth x 6 cycles 36 tablet 6  . lactulose (CHRONULAC) 10 GM/15ML solution TAKE 30 ML BY MOUTH THREE TIMES DAILY 946 mL 1  . lidocaine-prilocaine (EMLA) cream Apply to affected area once 30 g 3  . losartan (COZAAR) 100 MG tablet Take 1 tablet (100 mg total) by mouth daily. 90 tablet 1  . ondansetron (ZOFRAN) 8 MG tablet Take 1 tablet (8 mg total) by mouth every 8 (eight) hours as needed for refractory nausea / vomiting. Start on day 3 after carboplatin chemo. 30 tablet 1  . oxyCODONE (OXY IR/ROXICODONE) 5 MG immediate release tablet Take 1 tablet (5 mg total) by mouth every 4 (four) hours as needed for severe  pain. 60 tablet 0  . polyethylene glycol (MIRALAX / GLYCOLAX) 17 g packet Take 17 g by mouth daily.    . potassium chloride SA (KLOR-CON) 20 MEQ tablet Take 1 tablet (20 mEq total) by mouth daily. 30 tablet 1  . prochlorperazine (COMPAZINE) 10 MG tablet TAKE 1 TABLET(10 MG) BY MOUTH EVERY 6 HOURS AS NEEDED FOR NAUSEA OR VOMITING 30 tablet 1   No current facility-administered medications for this visit.   Facility-Administered Medications  Ordered in Other Visits  Medication Dose Route Frequency Provider Last Rate Last Admin  . CARBOplatin (PARAPLATIN) 570 mg in sodium chloride 0.9 % 250 mL chemo infusion  570 mg Intravenous Once Kiyla Ringler, MD      . heparin lock flush 100 unit/mL  500 Units Intracatheter Once PRN Alvy Bimler, Thurl Boen, MD      . magnesium sulfate IVPB 2 g 50 mL  2 g Intravenous Once Amanda Lark, MD 50 mL/hr at 08/11/20 0950 2 g at 08/11/20 0950  . PACLitaxel (TAXOL) 222 mg in sodium chloride 0.9 % 250 mL chemo infusion (> 33m/m2)  222 mg Intravenous Once Dashley Monts, MD      . sodium chloride flush (NS) 0.9 % injection 10 mL  10 mL Intracatheter PRN Valley Ke, MD      . trastuzumab-dkst (OGIVRI) 600 mg in sodium chloride 0.9 % 250 mL chemo infusion  600 mg Intravenous Once Onell Mcmath, MD        PHYSICAL EXAMINATION: ECOG PERFORMANCE STATUS: 2 - Symptomatic, <50% confined to bed  There were no vitals filed for this visit. There were no vitals filed for this visit.  GENERAL:alert, no distress and comfortable SKIN: skin color, texture, turgor are normal, no rashes or significant lesions EYES: normal, Conjunctiva are pink and non-injected, sclera clear OROPHARYNX:no exudate, no erythema and lips, buccal mucosa, and tongue normal  NECK: supple, thyroid normal size, non-tender, without nodularity LYMPH:  no palpable lymphadenopathy in the cervical, axillary or inguinal LUNGS: clear to auscultation and percussion with normal breathing effort HEART: regular rate & rhythm and no murmurs and no lower extremity edema ABDOMEN:abdomen soft, non-tender and normal bowel sounds Musculoskeletal:no cyanosis of digits and no clubbing  NEURO: alert & oriented x 3 with fluent speech, no focal motor/sensory deficits  LABORATORY DATA:  I have reviewed the data as listed    Component Value Date/Time   NA 140 08/11/2020 0746   NA 138 02/23/2020 1113   K 2.6 (LL) 08/11/2020 0746   CL 94 (L) 08/11/2020 0746   CO2 31 08/11/2020  0746   GLUCOSE 123 (H) 08/11/2020 0746   BUN 7 (L) 08/11/2020 0746   BUN 15 02/23/2020 1113   CREATININE 0.99 08/11/2020 0746   CALCIUM 8.6 (L) 08/11/2020 0746   PROT 6.9 08/11/2020 0746   PROT 7.8 02/23/2020 1113   ALBUMIN 3.2 (L) 08/11/2020 0746   ALBUMIN 4.4 02/23/2020 1113   AST 11 (L) 08/11/2020 0746   ALT 8 08/11/2020 0746   ALKPHOS 88 08/11/2020 0746   BILITOT 0.7 08/11/2020 0746   BILITOT 0.6 02/23/2020 1113   GFRNONAA >60 08/11/2020 0746   GFRAA 91 02/23/2020 1113    No results found for: SPEP, UPEP  Lab Results  Component Value Date   WBC 4.8 08/11/2020   NEUTROABS 3.1 08/11/2020   HGB 9.4 (L) 08/11/2020   HCT 29.5 (L) 08/11/2020   MCV 71.8 (L) 08/11/2020   PLT 289 08/11/2020      Chemistry  Component Value Date/Time   NA 140 08/11/2020 0746   NA 138 02/23/2020 1113   K 2.6 (LL) 08/11/2020 0746   CL 94 (L) 08/11/2020 0746   CO2 31 08/11/2020 0746   BUN 7 (L) 08/11/2020 0746   BUN 15 02/23/2020 1113   CREATININE 0.99 08/11/2020 0746      Component Value Date/Time   CALCIUM 8.6 (L) 08/11/2020 0746   ALKPHOS 88 08/11/2020 0746   AST 11 (L) 08/11/2020 0746   ALT 8 08/11/2020 0746   BILITOT 0.7 08/11/2020 0746   BILITOT 0.6 02/23/2020 1113       RADIOGRAPHIC STUDIES: I have personally reviewed the radiological images as listed and agreed with the findings in the report. CT CHEST ABDOMEN PELVIS W CONTRAST  Result Date: 07/25/2020 CLINICAL DATA:  Restaging uterine/cervical cancer. EXAM: CT CHEST, ABDOMEN, AND PELVIS WITH CONTRAST TECHNIQUE: Multidetector CT imaging of the chest, abdomen and pelvis was performed following the standard protocol during bolus administration of intravenous contrast. CONTRAST:  132m OMNIPAQUE IOHEXOL 300 MG/ML  SOLN COMPARISON:  PET-CT 05/30/2020. FINDINGS: CT CHEST FINDINGS Cardiovascular: The heart size appears within normal limits. No pericardial effusion. Mild aortic atherosclerosis. Mediastinum/Nodes: Normal appearance  of the thyroid gland. The trachea appears patent and is midline. Normal appearance of the esophagus. Stable 8 mm left supraclavicular lymph node, image 4/2. The left pre-vascular lymph node in the superior mediastinum measures 0.7 cm, image 7/2. Previously 1 cm. Low right paratracheal node at the carina measures 0.7 cm, image 19/2. Previously 1 cm. Index right retrocrural lymph node measures 5 mm, image 44/2. Previously 10 mm. Lungs/Pleura: No pleural effusion. Lingular nodule measures 0.7 cm, image 67/4.  Previously 1.2 cm. Chronic, small nodular density within the medial right lung base is unchanged and is favored to represent an area of chronic distal airway impaction, image 93/4. 4 mm left upper lobe lung nodule is noted, image 40/4.  Stable. 3 mm left upper lobe lung nodule is also unchanged, image 50/4. No new suspicious lung nodules. Musculoskeletal: No chest wall mass or suspicious bone lesions identified. CT ABDOMEN PELVIS FINDINGS Hepatobiliary: No suspicious liver lesions. Pancreas: Normal appearance of the pancreas Spleen: Spleen is unremarkable. Adrenals/Urinary Tract: The adrenal glands appear within normal limits. There is a suspicious lesion arising off the anterior cortex of the left kidney which measures 2.6 cm, image 33/7. Previously 2.4 cm. Previously characterize as a solid enhancing lesion. This remains suspicious for renal cell carcinoma. Hemorrhagic/proteinaceous cyst arising from the inferior pole of the right kidney is also noted measuring 1.5 cm, image 35/7. Unchanged from previous exam. There are additional, subcentimeter low-density kidney lesions noted bilaterally which are technically too small to reliably characterize. Left-sided hydronephrosis and hydroureter is again noted. No obstructing stone identified. Stomach/Bowel: The stomach appears nondistended. The appendix is visualized and is unremarkable. No bowel wall inflammation or bowel distension identified. Vascular/Lymphatic:  Aortic atherosclerosis.  No aneurysm. -Index left periaortic node measures 1.1 cm, image 59/2. Previously 1.5 cm. -The left pelvic sidewall index lymph node measures 1.1 cm, image 94/2. Previously 1.3 cm. Reproductive: Calcified uterine fibroids are identified. Similar appearance of subserosal fibroid arising off the right lateral myometrium measuring 1.9 cm, image 90/2. No adnexal mass. Other: Near complete resolution of abdominopelvic ascites with small volume of perisplenic and perihepatic ascites persisting. The omental cake has decreased in thickness compared with the previous exam. -On the left this measures 1.2 cm, image 69/2.  Previously 2.0 cm. -On the right this area measures 0.9  cm, image 69/2. Previously 2.6 cm. Previous FDG avid tumor within the umbilicus is again noted measuring 3.0 x 2.3 cm, image 81/2. Previously 4.0 x 2.8 cm. Musculoskeletal: No suspicious lytic or sclerotic bone lesions. IMPRESSION: 1. Interval response to therapy. There has been interval decrease in size of mediastinal, retrocrural, retroperitoneal, and left pelvic sidewall lymph nodes. 2. Decreased volume of ascites. Interval decrease in thickness of omental cake secondary to peritoneal disease. 3. Decrease in size of FDG avid tumor within the umbilicus. 4. Decrease in size of left upper lobe lung nodule. 5. Persistent left-sided hydronephrosis and hydroureter. No obstructing stone identified. 6. Previously noted solid enhancing lesion arising off the anterior cortex of the inferior pole of the right kidney is again noted and remains worrisome for renal cell carcinoma. 7. Aortic atherosclerosis. Aortic Atherosclerosis (ICD10-I70.0). Electronically Signed   By: Kerby Moors M.D.   On: 07/25/2020 12:49   ECHOCARDIOGRAM COMPLETE  Result Date: 08/08/2020    ECHOCARDIOGRAM REPORT   Patient Name:   Amanda Lin. Alejandro Date of Exam: 08/08/2020 Medical Rec #:  [id number]          Height:       61.0 in Accession #:    [id number]          Weight:       211.5 lb Date of Birth:  [birth year]          BSA:          1.934 m Patient Age:    65 years           BP:           168/79 mmHg Patient Gender: F                  HR:           75 bpm. Exam Location:  Outpatient Procedure: 2D Echo, Cardiac Doppler, Color Doppler and Strain Analysis Indications:    Chemo Z09  History:        Patient has prior history of Echocardiogram examinations, most                 recent 06/02/2020. Risk Factors:Hypertension and Diabetes.  Sonographer:    Darlina Sicilian RDCS Referring Phys: [id number] Tejuan Gholson [place]  1. Left ventricular ejection fraction, by estimation, is 65 to 70%. The left ventricle has normal function. The left ventricle has no regional wall motion abnormalities. There is mild concentric left ventricular hypertrophy of the basal-septal segment. Left ventricular diastolic parameters are consistent with Grade I diastolic dysfunction (impaired relaxation). The average left ventricular global longitudinal strain is -18.5 %. The global longitudinal strain is normal.  2. Right ventricular systolic function is normal. The right ventricular size is normal.  3. The mitral valve is normal in structure. No evidence of mitral valve regurgitation.  4. The aortic valve is tricuspid. Aortic valve regurgitation is not visualized. No aortic stenosis is present.  5. The inferior vena cava is normal in size with greater than 50% respiratory variability, suggesting right atrial pressure of 3 mmHg. Comparison(s): A prior study was performed on 06/02/2020. No significant change from prior study. Prior images reviewed side by side. Similar LV deformation and strain and improved loading conditions (improved blood pressure). FINDINGS  Left Ventricle: Left ventricular ejection fraction, by estimation, is 65 to 70%. The left ventricle has normal function. The left ventricle has no regional wall motion abnormalities. The average left ventricular global longitudinal strain is -18.5 %.  The  global longitudinal strain is normal. The left ventricular internal cavity size was normal in size. There is mild concentric left ventricular hypertrophy of the basal-septal segment. Left ventricular diastolic parameters are consistent with Grade I diastolic dysfunction (impaired relaxation). Right Ventricle: The right ventricular size is normal. No increase in right ventricular wall thickness. Right ventricular systolic function is normal. Left Atrium: Left atrial size was normal in size. Right Atrium: Right atrial size was normal in size. Pericardium: There is no evidence of pericardial effusion. Mitral Valve: The mitral valve is normal in structure. No evidence of mitral valve regurgitation. Tricuspid Valve: The tricuspid valve is normal in structure. Tricuspid valve regurgitation is not demonstrated. No evidence of tricuspid stenosis. Aortic Valve: The aortic valve is tricuspid. Aortic valve regurgitation is not visualized. No aortic stenosis is present. Pulmonic Valve: The pulmonic valve was grossly normal. Pulmonic valve regurgitation is not visualized. No evidence of pulmonic stenosis. Aorta: The aortic root and ascending aorta are structurally normal, with no evidence of dilitation. Venous: The inferior vena cava is normal in size with greater than 50% respiratory variability, suggesting right atrial pressure of 3 mmHg. IAS/Shunts: The atrial septum is grossly normal.  LEFT VENTRICLE PLAX 2D LVIDd:         3.70 cm  Diastology LVIDs:         2.20 cm  LV e' medial:    5.77 cm/s LV PW:         1.00 cm  LV E/e' medial:  12.1 LV IVS:        1.50 cm  LV e' lateral:   5.00 cm/s LVOT diam:     1.90 cm  LV E/e' lateral: 14.0 LV SV:         61 LV SV Index:   32       2D Longitudinal Strain LVOT Area:     2.84 cm 2D Strain GLS (A2C):   -17.5 %                         2D Strain GLS (A3C):   -19.4 %                         2D Strain GLS Avg:     -18.5 % RIGHT VENTRICLE RV S prime:     13.80 cm/s TAPSE (M-mode): 2.2  cm LEFT ATRIUM             Index       RIGHT ATRIUM           Index LA diam:        3.20 cm 1.65 cm/m  RA Area:     10.50 cm LA Vol (A2C):   33.8 ml 17.48 ml/m RA Volume:   22.30 ml  11.53 ml/m LA Vol (A4C):   23.1 ml 11.94 ml/m LA Biplane Vol: 29.8 ml 15.41 ml/m  AORTIC VALVE LVOT Vmax:   119.00 cm/s LVOT Vmean:  84.100 cm/s LVOT VTI:    0.216 m  AORTA Ao Root diam: 2.80 cm Ao Asc diam:  2.10 cm MITRAL VALVE MV Area (PHT): 3.37 cm    SHUNTS MV Decel Time: 225 msec    Systemic VTI:  0.22 m MV E velocity: 69.80 cm/s  Systemic Diam: 1.90 cm MV A velocity: 83.60 cm/s MV E/A ratio:  0.83 Rudean Haskell MD Electronically signed by Rudean Haskell MD Signature Date/Time: 08/08/2020/3:20:40 PM    Final

## 2020-08-16 ENCOUNTER — Inpatient Hospital Stay: Payer: 59

## 2020-08-16 ENCOUNTER — Inpatient Hospital Stay (HOSPITAL_BASED_OUTPATIENT_CLINIC_OR_DEPARTMENT_OTHER): Payer: 59 | Admitting: Hematology and Oncology

## 2020-08-16 ENCOUNTER — Encounter: Payer: Self-pay | Admitting: Hematology and Oncology

## 2020-08-16 ENCOUNTER — Other Ambulatory Visit: Payer: Self-pay

## 2020-08-16 ENCOUNTER — Other Ambulatory Visit: Payer: Self-pay | Admitting: Hematology and Oncology

## 2020-08-16 VITALS — BP 148/78 | HR 78 | Temp 98.2°F | Resp 18

## 2020-08-16 DIAGNOSIS — C541 Malignant neoplasm of endometrium: Secondary | ICD-10-CM | POA: Diagnosis not present

## 2020-08-16 DIAGNOSIS — Z5111 Encounter for antineoplastic chemotherapy: Secondary | ICD-10-CM | POA: Diagnosis not present

## 2020-08-16 DIAGNOSIS — C778 Secondary and unspecified malignant neoplasm of lymph nodes of multiple regions: Secondary | ICD-10-CM

## 2020-08-16 DIAGNOSIS — E876 Hypokalemia: Secondary | ICD-10-CM

## 2020-08-16 DIAGNOSIS — C7800 Secondary malignant neoplasm of unspecified lung: Secondary | ICD-10-CM

## 2020-08-16 DIAGNOSIS — D539 Nutritional anemia, unspecified: Secondary | ICD-10-CM | POA: Diagnosis not present

## 2020-08-16 DIAGNOSIS — K089 Disorder of teeth and supporting structures, unspecified: Secondary | ICD-10-CM | POA: Insufficient documentation

## 2020-08-16 DIAGNOSIS — I10 Essential (primary) hypertension: Secondary | ICD-10-CM

## 2020-08-16 LAB — COMPREHENSIVE METABOLIC PANEL
ALT: 21 U/L (ref 0–44)
AST: 25 U/L (ref 15–41)
Albumin: 3.5 g/dL (ref 3.5–5.0)
Alkaline Phosphatase: 80 U/L (ref 38–126)
Anion gap: 16 — ABNORMAL HIGH (ref 5–15)
BUN: 15 mg/dL (ref 8–23)
CO2: 31 mmol/L (ref 22–32)
Calcium: 8.6 mg/dL — ABNORMAL LOW (ref 8.9–10.3)
Chloride: 91 mmol/L — ABNORMAL LOW (ref 98–111)
Creatinine, Ser: 1.21 mg/dL — ABNORMAL HIGH (ref 0.44–1.00)
GFR, Estimated: 50 mL/min — ABNORMAL LOW (ref >60)
Glucose, Bld: 178 mg/dL — ABNORMAL HIGH (ref 70–99)
Potassium: 2.7 mmol/L — CL (ref 3.5–5.1)
Sodium: 138 mmol/L (ref 135–145)
Total Bilirubin: 1 mg/dL (ref 0.3–1.2)
Total Protein: 7.3 g/dL (ref 6.5–8.1)

## 2020-08-16 LAB — CBC WITH DIFFERENTIAL/PLATELET
Abs Immature Granulocytes: 0 10*3/uL (ref 0.00–0.07)
Band Neutrophils: 4 %
Basophils Absolute: 0 10*3/uL (ref 0.0–0.1)
Basophils Relative: 0 %
Eosinophils Absolute: 0 10*3/uL (ref 0.0–0.5)
Eosinophils Relative: 0 %
HCT: 29.8 % — ABNORMAL LOW (ref 36.0–46.0)
Hemoglobin: 9.3 g/dL — ABNORMAL LOW (ref 12.0–15.0)
Lymphocytes Relative: 10 %
Lymphs Abs: 0.3 10*3/uL — ABNORMAL LOW (ref 0.7–4.0)
MCH: 22.6 pg — ABNORMAL LOW (ref 26.0–34.0)
MCHC: 31.2 g/dL (ref 30.0–36.0)
MCV: 72.5 fL — ABNORMAL LOW (ref 80.0–100.0)
Monocytes Absolute: 0.1 10*3/uL (ref 0.1–1.0)
Monocytes Relative: 3 %
Neutro Abs: 2.4 10*3/uL (ref 1.7–7.7)
Neutrophils Relative %: 83 %
Platelets: 294 10*3/uL (ref 150–400)
RBC: 4.11 MIL/uL (ref 3.87–5.11)
RDW: 27.1 % — ABNORMAL HIGH (ref 11.5–15.5)
WBC: 2.8 10*3/uL — ABNORMAL LOW (ref 4.0–10.5)
nRBC: 0 % (ref 0.0–0.2)

## 2020-08-16 LAB — MAGNESIUM: Magnesium: 1 mg/dL — ABNORMAL LOW (ref 1.7–2.4)

## 2020-08-16 MED ORDER — SODIUM CHLORIDE 0.9% FLUSH
10.0000 mL | Freq: Once | INTRAVENOUS | Status: DC | PRN
  Filled 2020-08-16: qty 10

## 2020-08-16 MED ORDER — MAGNESIUM SULFATE 2 GM/50ML IV SOLN
INTRAVENOUS | Status: AC
  Filled 2020-08-16: qty 150

## 2020-08-16 MED ORDER — MAGNESIUM SULFATE 2 GM/50ML IV SOLN: 6.0000 g | Freq: Once | INTRAVENOUS | Status: DC

## 2020-08-16 MED ORDER — MAGNESIUM SULFATE 2 GM/50ML IV SOLN
6.0000 g | INTRAVENOUS | Status: AC
  Administered 2020-08-16 (×3): 2 g via INTRAVENOUS

## 2020-08-16 MED ORDER — POTASSIUM CHLORIDE 10 MEQ/100ML IV SOLN
INTRAVENOUS | Status: AC
  Filled 2020-08-16: qty 200

## 2020-08-16 MED ORDER — SODIUM CHLORIDE 0.9 % IV SOLN
Freq: Once | INTRAVENOUS | Status: AC
  Filled 2020-08-16: qty 250

## 2020-08-16 MED ORDER — SODIUM CHLORIDE 0.9 % IV SOLN: 6.0000 g | Freq: Once | INTRAVENOUS | Status: DC

## 2020-08-16 MED ORDER — IRON SUCROSE 20 MG/ML IV SOLN
300.0000 mg | Freq: Once | INTRAVENOUS | Status: AC
  Administered 2020-08-16: 300 mg via INTRAVENOUS
  Filled 2020-08-16: qty 10

## 2020-08-16 MED ORDER — POTASSIUM CHLORIDE 10 MEQ/100ML IV SOLN: 10.0000 meq | INTRAVENOUS | Status: DC

## 2020-08-16 MED ORDER — POTASSIUM CHLORIDE 10 MEQ/100ML IV SOLN
10.0000 meq | INTRAVENOUS | Status: AC
  Administered 2020-08-16 (×2): 10 meq via INTRAVENOUS

## 2020-08-16 MED ORDER — HEPARIN SOD (PORK) LOCK FLUSH 100 UNIT/ML IV SOLN
500.0000 [IU] | Freq: Once | INTRAVENOUS | Status: DC | PRN
  Filled 2020-08-16: qty 5

## 2020-08-16 MED ORDER — SODIUM CHLORIDE 0.9% FLUSH
10.0000 mL | Freq: Once | INTRAVENOUS | Status: AC
  Administered 2020-08-16: 10 mL
  Filled 2020-08-16: qty 10

## 2020-08-16 MED ORDER — MAGNESIUM SULFATE 4 GM/100ML IV SOLN
4.0000 g | Freq: Once | INTRAVENOUS | Status: DC
  Filled 2020-08-16: qty 100

## 2020-08-16 NOTE — Assessment & Plan Note (Signed)
She has signs of severe iron deficiency anemia She does not need transfusion support Due to her chronic GI issues, I do not believe she can tolerate oral iron supplement  The most likely cause of her anemia is due to chronic blood loss/malabsorption syndrome. We discussed some of the risks, benefits, and alternatives of intravenous iron infusions. The patient is symptomatic from anemia and the iron level is critically low. She tolerated oral iron supplement poorly and desires to achieved higher levels of iron faster for adequate hematopoesis. Some of the side-effects to be expected including risks of infusion reactions, phlebitis, headaches, nausea and fatigue.  The patient is willing to proceed. Patient education material was dispensed.  Goal is to keep ferritin level greater than 50 and resolution of anemia

## 2020-08-16 NOTE — Progress Notes (Signed)
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 We discussed some of the risks, benefits, and alternatives of intravenous iron infusions. The patient is symptomatic from anemia and the iron level is critically low. She tolerated oral iron supplement poorly and desires to achieved higher levels of iron faster for adequate hematopoesis. Some of the side-effects to be expected including risks of infusion reactions, phlebitis, headaches, nausea and fatigue.  The patient is willing to proceed. Patient education material was dispensed.  Goal is to keep ferritin level greater than 50 and resolution of anemia   Hypomagnesemia She has not been taking oral magnesium supplement I encouraged her to start We will give her IV magnesium today  Hypokalemia, inadequate intake I gave her a list of food high in potassium We will also give her potassium replacement therapy today  Essential hypertension Her blood pressure is elevated but could be exacerbated by anxiety Observe closely for now  Poor dentition I recommend she seek attention with a local dentist for evaluation If she needs dental extraction, the safest time to do it would be at the end of the month   No orders of the defined types were placed in this encounter.   All questions were answered.  The patient knows to call the clinic with any problems, questions or concerns. The total time spent in the appointment was 30 minutes encounter with patients including review of chart and various tests results, discussions about plan of care and coordination of care plan   [name], MD 08/16/2020 12:06 PM  INTERVAL HISTORY: Please see below for problem oriented charting. 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     02/26/2020 Imaging   1. 3 mm nonobstructing renal stone within the left kidney. 2. Findings suggestive of a small hemorrhagic cyst within the lower pole of the right kidney, with an anterior right lower pole heterogeneous renal soft tissue mass. MRI correlation is recommended, as an underlying neoplastic process cannot be excluded. 3. Colonic diverticulosis. 4. Multiple exophytic uterine fibroids. 5. Evidence of prior cholecystectomy.   03/28/2020 Imaging   2.4 cm enhancing mass in the anterior right lower kidney, suspicious for solid renal neoplasm such as renal cell carcinoma.   Single right renal artery and vein.  No renal vein invasion.   Small retroperitoneal nodes, measuring up to 9 mm short axis, mildly prominent but technically within the upper limits of normal. Attention on follow-up is suggested.   4  x 5 mm nodule in the medial right lower lobe. This is nonspecific and unlikely to reflect a metastasis but warrants attention on follow-up. Consider dedicated CT chest in 3-6 months.   3 mm nonobstructing  left lower pole renal calculus. No hydronephrosis.     04/04/2020 Imaging   1. 10 mm endometrial stripe thickness. This is considered abnormal in a postmenopausal female with bleeding. In the setting of post-menopausal bleeding, endometrial sampling is indicated to exclude carcinoma.  2. Multiple uterine fibroids including potential submucosal fibroid in the lower uterine segment.   04/19/2020 Pathology Results   A. ENDOMETRIUM, BIOPSY:  -  High-grade carcinoma  Immunohistochemical and morphometric analysis performed manually   The tumor cells are POSITIVE for Her2 (3+).    04/19/2020 Procedure   She was seen by Dr. Astrid Drafts on 04/19/2020.  At that visit her cervix was noted to be nodular and firm and abnormal appearing.  Pap taken at that visit was positive for adenocarcinoma, endometrial.  An endometrial Pipelle biopsy was taken at the same visit which revealed high-grade endometrial cancer, suspicious for serous carcinoma.     05/12/2020 Cancer Staging   Staging form: Corpus Uteri - Carcinoma and Carcinosarcoma, AJCC 8th Edition - Clinical stage from 05/12/2020: FIGO Stage IVB (cT3, cN2, cM1) - Signed by [name], MD on 05/31/2020   05/30/2020 PET scan   1. Hypermetabolic lymph nodes identified in the left supraclavicular region/thoracic inlet, mediastinum, hilar regions, right retrocrural space, para-aortic retroperitoneum, and bilateral pelvic sidewall, compatible with metastatic disease. 2. Moderate volume ascites on this noncontrast CT study largely obscures peritoneal and omental disease although hypermetabolic plaque-like uptake in the anterior high abdomen is probably peritoneal with relatively bulky hypermetabolism in the omentum. These findings are also compatible with metastatic disease. 3. Hypermetabolic left upper lobe pulmonary nodule consistent with metastatic disease. Lung primary is considered less likely but not excluded. 4. Scattered areas of focal marrow uptake are  suspicious for bony metastases although no underlying abnormality is evident on CT imaging. 5. Diffuse uptake in the uterine anatomy compatible with the patient's known history of endometrial carcinoma. 6. As above, moderate volume ascites is new in the interval. 7. Enhancing lesion lower pole right kidney seen on previous CT of 03/28/2020 is less conspicuous on today's noncontrast CT imaging. Diffuse normal background FDG accumulation could obscure hypermetabolism in this lesion previously characterized as suspicious for renal cell carcinoma. 8. Bibasilar collapse/consolidation in the lower lobes.   06/01/2020 Procedure   Ultrasound and fluoroscopically guided right internal jugular single lumen power port catheter insertion. Tip in the SVC/RA junction. Catheter ready for use.   06/02/2020 Echocardiogram    1. GLS -18.2%.  2. Left ventricular ejection fraction, by estimation, is 60 to 65%. The left ventricle has normal function. The left ventricle has no regional wall motion abnormalities. There is mild left ventricular hypertrophy. Left ventricular diastolic parameters are consistent with Grade I diastolic dysfunction (impaired relaxation).  3. Right ventricular systolic function is normal. The right ventricular size is normal.  4. The mitral valve is normal in structure. No evidence of mitral valve regurgitation. No evidence of mitral stenosis.  5. The aortic valve is tricuspid. Aortic valve regurgitation is not visualized. No aortic stenosis is present.  6. The inferior vena cava is normal in size with greater than 50% respiratory variability, suggesting right atrial pressure of 3 mmHg.     06/03/2020 -  Chemotherapy    Patient is on Treatment Plan: UTERINE SEROUS CARCINOMA CARBOPLATIN +  PACLITAXEL + TRASTUZUMAB Q21D X 6 CYCLES / TRASTUZUMAB Q21D      07/25/2020 Imaging   1. Interval response to therapy. There has been interval decrease in size of mediastinal, retrocrural, retroperitoneal,  and left pelvic sidewall lymph nodes. 2. Decreased volume of ascites. Interval decrease in thickness of omental cake secondary to peritoneal disease. 3. Decrease in size of FDG avid tumor within the umbilicus. 4. Decrease in size of left upper lobe lung nodule. 5. Persistent left-sided hydronephrosis and hydroureter. No obstructing stone identified. 6. Previously noted solid enhancing lesion arising off the anterior cortex of the inferior pole of the right kidney is again noted and remains worrisome for renal cell carcinoma. 7. Aortic atherosclerosis.     08/08/2020 Echocardiogram    1. Left ventricular ejection fraction, by estimation, is 65 to 70%. The left ventricle has normal function. The left ventricle has no regional wall motion abnormalities. There is mild concentric left ventricular hypertrophy of the basal-septal segment. Left ventricular diastolic parameters are consistent with Grade I diastolic dysfunction (impaired relaxation). The average left ventricular global longitudinal strain is -18.5 %. The global longitudinal strain is normal.  2. Right ventricular systolic function is normal. The right ventricular size is normal.  3. The mitral valve is normal in structure. No evidence of mitral valve regurgitation.  4. The aortic valve is tricuspid. Aortic valve regurgitation is not visualized. No aortic stenosis is present.  5. The inferior vena cava is normal in size with greater than 50% respiratory variability, suggesting right atrial pressure of 3 mmHg.   Comparison(s): A prior study was performed on 06/02/2020. No significant change from prior study. Prior images reviewed side by side. Similar LV deformation and strain and improved loading conditions (improved blood pressure).   Metastasis to lymph nodes ([place])  05/31/2020 Initial Diagnosis   Metastasis to lymph nodes ([place])   06/03/2020 -  Chemotherapy    Patient is on Treatment Plan: UTERINE SEROUS CARCINOMA CARBOPLATIN + PACLITAXEL +  TRASTUZUMAB Q21D X 6 CYCLES / TRASTUZUMAB Q21D      Metastasis to lung ([place])  05/31/2020 Initial Diagnosis   Metastasis to lung ([place])   06/03/2020 -  Chemotherapy    Patient is on Treatment Plan: UTERINE SEROUS CARCINOMA CARBOPLATIN + PACLITAXEL + TRASTUZUMAB Q21D X 6 CYCLES / TRASTUZUMAB Q21D        REVIEW OF SYSTEMS:   Constitutional: Denies fevers, chills or abnormal weight loss Eyes: Denies blurriness of vision Ears, nose, mouth, throat, and face: Denies mucositis or sore throat Respiratory: Denies cough, dyspnea or wheezes Cardiovascular: Denies palpitation, chest discomfort or lower extremity swelling Gastrointestinal:  Denies nausea, heartburn or change in bowel habits Skin: Denies abnormal skin rashes Lymphatics: Denies new lymphadenopathy or easy bruising Neurological:Denies numbness, tingling or new weaknesses Behavioral/Psych: Mood is stable, no new changes  All other systems were reviewed with the patient and are negative.  I have reviewed the past medical history, past surgical history, social history and family history with the patient and they are unchanged from previous note.  ALLERGIES:  has No Known Allergies.  MEDICATIONS:  Current Outpatient Medications  Medication Sig Dispense Refill  . amLODipine (NORVASC) 10 MG tablet Take 1 tablet (10 mg total) by mouth daily. 30 tablet 1  . dexamethasone (DECADRON) 4 MG tablet Take 2 tabs at the night before and 2 tab the morning of chemotherapy, every 3 weeks, by mouth x 6 cycles 36 tablet 6  . lactulose (CHRONULAC) 10 GM/15ML solution TAKE 30 ML BY  MOUTH THREE TIMES DAILY 946 mL 1  . lidocaine-prilocaine (EMLA) cream Apply to affected area once 30 g 3  . losartan (COZAAR) 100 MG tablet Take 1 tablet (100 mg total) by mouth daily. 90 tablet 1  . magnesium oxide (MAG-OX) 400 (241.3 Mg) MG tablet Take 1 tablet (400 mg total) by mouth daily. 30 tablet 1  . ondansetron (ZOFRAN) 8 MG tablet Take 1 tablet (8 mg total) by  mouth every 8 (eight) hours as needed for refractory nausea / vomiting. Start on day 3 after carboplatin chemo. 30 tablet 1  . oxyCODONE (OXY IR/ROXICODONE) 5 MG immediate release tablet Take 1 tablet (5 mg total) by mouth every 4 (four) hours as needed for severe pain. 60 tablet 0  . polyethylene glycol (MIRALAX / GLYCOLAX) 17 g packet Take 17 g by mouth daily.    . potassium chloride SA (KLOR-CON) 20 MEQ tablet Take 1 tablet (20 mEq total) by mouth daily. 30 tablet 1  . prochlorperazine (COMPAZINE) 10 MG tablet TAKE 1 TABLET(10 MG) BY MOUTH EVERY 6 HOURS AS NEEDED FOR NAUSEA OR VOMITING 30 tablet 1   No current facility-administered medications for this visit.   Facility-Administered Medications Ordered in Other Visits  Medication Dose Route Frequency Provider Last Rate Last Admin  . heparin lock flush 100 unit/mL  500 Units Intracatheter Once PRN Alvy Bimler, Yenny Kosa, MD      . iron sucrose (VENOFER) 300 mg in sodium chloride 0.9 % 250 mL IVPB  300 mg Intravenous Once Endi Lagman, MD      . magnesium sulfate IVPB 6 g 150 mL  6 g Intravenous Once Sandi Mealy E., PA-C      . sodium chloride flush (NS) 0.9 % injection 10 mL  10 mL Intracatheter Once PRN Alvy Bimler, Chelise Hanger, MD        PHYSICAL EXAMINATION: ECOG PERFORMANCE STATUS: 2 - Symptomatic, <50% confined to bed  Vitals:   08/16/20 1046  BP: (!) 151/83  Pulse: (!) 113  Resp: 16  Temp: 97.8 F (36.6 C)  SpO2: 100%   Filed Weights   08/16/20 1046  Weight: 206 lb 6.4 oz (93.6 kg)    GENERAL:alert, no distress and comfortable Musculoskeletal:no cyanosis of digits and no clubbing  NEURO: alert & oriented x 3 with fluent speech, no focal motor/sensory deficits  LABORATORY DATA:  I have reviewed the data as listed    Component Value Date/Time   NA 138 08/16/2020 1025   NA 138 02/23/2020 1113   K 2.7 (LL) 08/16/2020 1025   CL 91 (L) 08/16/2020 1025   CO2 31 08/16/2020 1025   GLUCOSE 178 (H) 08/16/2020 1025   BUN 15 08/16/2020 1025   BUN  15 02/23/2020 1113   CREATININE 1.21 (H) 08/16/2020 1025   CALCIUM 8.6 (L) 08/16/2020 1025   PROT 7.3 08/16/2020 1025   PROT 7.8 02/23/2020 1113   ALBUMIN 3.5 08/16/2020 1025   ALBUMIN 4.4 02/23/2020 1113   AST 25 08/16/2020 1025   ALT 21 08/16/2020 1025   ALKPHOS 80 08/16/2020 1025   BILITOT 1.0 08/16/2020 1025   BILITOT 0.6 02/23/2020 1113   GFRNONAA 50 (L) 08/16/2020 1025   GFRAA 91 02/23/2020 1113    No results found for: SPEP, UPEP  Lab Results  Component Value Date   WBC 2.8 (L) 08/16/2020   NEUTROABS 2.4 08/16/2020   HGB 9.3 (L) 08/16/2020   HCT 29.8 (L) 08/16/2020   MCV 72.5 (L) 08/16/2020   PLT 294 08/16/2020  Chemistry      Component Value Date/Time   NA 138 08/16/2020 1025   NA 138 02/23/2020 1113   K 2.7 (LL) 08/16/2020 1025   CL 91 (L) 08/16/2020 1025   CO2 31 08/16/2020 1025   BUN 15 08/16/2020 1025   BUN 15 02/23/2020 1113   CREATININE 1.21 (H) 08/16/2020 1025      Component Value Date/Time   CALCIUM 8.6 (L) 08/16/2020 1025   ALKPHOS 80 08/16/2020 1025   AST 25 08/16/2020 1025   ALT 21 08/16/2020 1025   BILITOT 1.0 08/16/2020 1025   BILITOT 0.6 02/23/2020 1113       RADIOGRAPHIC STUDIES: I have personally reviewed the radiological images as listed and agreed with the findings in the report. CT CHEST ABDOMEN PELVIS W CONTRAST  Result Date: 07/25/2020 CLINICAL DATA:  Restaging uterine/cervical cancer. EXAM: CT CHEST, ABDOMEN, AND PELVIS WITH CONTRAST TECHNIQUE: Multidetector CT imaging of the chest, abdomen and pelvis was performed following the standard protocol during bolus administration of intravenous contrast. CONTRAST:  146m OMNIPAQUE IOHEXOL 300 MG/ML  SOLN COMPARISON:  PET-CT 05/30/2020. FINDINGS: CT CHEST FINDINGS Cardiovascular: The heart size appears within normal limits. No pericardial effusion. Mild aortic atherosclerosis. Mediastinum/Nodes: Normal appearance of the thyroid gland. The trachea appears patent and is midline. Normal  appearance of the esophagus. Stable 8 mm left supraclavicular lymph node, image 4/2. The left pre-vascular lymph node in the superior mediastinum measures 0.7 cm, image 7/2. Previously 1 cm. Low right paratracheal node at the carina measures 0.7 cm, image 19/2. Previously 1 cm. Index right retrocrural lymph node measures 5 mm, image 44/2. Previously 10 mm. Lungs/Pleura: No pleural effusion. Lingular nodule measures 0.7 cm, image 67/4.  Previously 1.2 cm. Chronic, small nodular density within the medial right lung base is unchanged and is favored to represent an area of chronic distal airway impaction, image 93/4. 4 mm left upper lobe lung nodule is noted, image 40/4.  Stable. 3 mm left upper lobe lung nodule is also unchanged, image 50/4. No new suspicious lung nodules. Musculoskeletal: No chest wall mass or suspicious bone lesions identified. CT ABDOMEN PELVIS FINDINGS Hepatobiliary: No suspicious liver lesions. Pancreas: Normal appearance of the pancreas Spleen: Spleen is unremarkable. Adrenals/Urinary Tract: The adrenal glands appear within normal limits. There is a suspicious lesion arising off the anterior cortex of the left kidney which measures 2.6 cm, image 33/7. Previously 2.4 cm. Previously characterize as a solid enhancing lesion. This remains suspicious for renal cell carcinoma. Hemorrhagic/proteinaceous cyst arising from the inferior pole of the right kidney is also noted measuring 1.5 cm, image 35/7. Unchanged from previous exam. There are additional, subcentimeter low-density kidney lesions noted bilaterally which are technically too small to reliably characterize. Left-sided hydronephrosis and hydroureter is again noted. No obstructing stone identified. Stomach/Bowel: The stomach appears nondistended. The appendix is visualized and is unremarkable. No bowel wall inflammation or bowel distension identified. Vascular/Lymphatic: Aortic atherosclerosis.  No aneurysm. -Index left periaortic node measures  1.1 cm, image 59/2. Previously 1.5 cm. -The left pelvic sidewall index lymph node measures 1.1 cm, image 94/2. Previously 1.3 cm. Reproductive: Calcified uterine fibroids are identified. Similar appearance of subserosal fibroid arising off the right lateral myometrium measuring 1.9 cm, image 90/2. No adnexal mass. Other: Near complete resolution of abdominopelvic ascites with small volume of perisplenic and perihepatic ascites persisting. The omental cake has decreased in thickness compared with the previous exam. -On the left this measures 1.2 cm, image 69/2.  Previously 2.0 cm. -On the  right this area measures 0.9 cm, image 69/2. Previously 2.6 cm. Previous FDG avid tumor within the umbilicus is again noted measuring 3.0 x 2.3 cm, image 81/2. Previously 4.0 x 2.8 cm. Musculoskeletal: No suspicious lytic or sclerotic bone lesions. IMPRESSION: 1. Interval response to therapy. There has been interval decrease in size of mediastinal, retrocrural, retroperitoneal, and left pelvic sidewall lymph nodes. 2. Decreased volume of ascites. Interval decrease in thickness of omental cake secondary to peritoneal disease. 3. Decrease in size of FDG avid tumor within the umbilicus. 4. Decrease in size of left upper lobe lung nodule. 5. Persistent left-sided hydronephrosis and hydroureter. No obstructing stone identified. 6. Previously noted solid enhancing lesion arising off the anterior cortex of the inferior pole of the right kidney is again noted and remains worrisome for renal cell carcinoma. 7. Aortic atherosclerosis. Aortic Atherosclerosis (ICD10-I70.0). Electronically Signed   By: Kerby Moors M.D.   On: 07/25/2020 12:49   ECHOCARDIOGRAM COMPLETE  Result Date: 08/08/2020    ECHOCARDIOGRAM REPORT   Patient Name:   Amanda Lin. Hatchell Date of Exam: 08/08/2020 Medical Rec #:  [id number]          Height:       61.0 in Accession #:    [id number]         Weight:       211.5 lb Date of Birth:  1956/04/24          BSA:           1.934 m Patient Age:    65 years           BP:           168/79 mmHg Patient Gender: F                  HR:           75 bpm. Exam Location:  Outpatient Procedure: 2D Echo, Cardiac Doppler, Color Doppler and Strain Analysis Indications:    Chemo Z09  History:        Patient has prior history of Echocardiogram examinations, most                 recent 06/02/2020. Risk Factors:Hypertension and Diabetes.  Sonographer:    Darlina Sicilian RDCS Referring Phys: [id number] Jaslyn Bansal [place]  1. Left ventricular ejection fraction, by estimation, is 65 to 70%. The left ventricle has normal function. The left ventricle has no regional wall motion abnormalities. There is mild concentric left ventricular hypertrophy of the basal-septal segment. Left ventricular diastolic parameters are consistent with Grade I diastolic dysfunction (impaired relaxation). The average left ventricular global longitudinal strain is -18.5 %. The global longitudinal strain is normal.  2. Right ventricular systolic function is normal. The right ventricular size is normal.  3. The mitral valve is normal in structure. No evidence of mitral valve regurgitation.  4. The aortic valve is tricuspid. Aortic valve regurgitation is not visualized. No aortic stenosis is present.  5. The inferior vena cava is normal in size with greater than 50% respiratory variability, suggesting right atrial pressure of 3 mmHg. Comparison(s): A prior study was performed on 06/02/2020. No significant change from prior study. Prior images reviewed side by side. Similar LV deformation and strain and improved loading conditions (improved blood pressure). FINDINGS  Left Ventricle: Left ventricular ejection fraction, by estimation, is 65 to 70%. The left ventricle has normal function. The left ventricle has no regional wall motion abnormalities. The average left ventricular global longitudinal strain  is -18.5 %. The global longitudinal strain is normal. The left ventricular internal  cavity size was normal in size. There is mild concentric left ventricular hypertrophy of the basal-septal segment. Left ventricular diastolic parameters are consistent with Grade I diastolic dysfunction (impaired relaxation). Right Ventricle: The right ventricular size is normal. No increase in right ventricular wall thickness. Right ventricular systolic function is normal. Left Atrium: Left atrial size was normal in size. Right Atrium: Right atrial size was normal in size. Pericardium: There is no evidence of pericardial effusion. Mitral Valve: The mitral valve is normal in structure. No evidence of mitral valve regurgitation. Tricuspid Valve: The tricuspid valve is normal in structure. Tricuspid valve regurgitation is not demonstrated. No evidence of tricuspid stenosis. Aortic Valve: The aortic valve is tricuspid. Aortic valve regurgitation is not visualized. No aortic stenosis is present. Pulmonic Valve: The pulmonic valve was grossly normal. Pulmonic valve regurgitation is not visualized. No evidence of pulmonic stenosis. Aorta: The aortic root and ascending aorta are structurally normal, with no evidence of dilitation. Venous: The inferior vena cava is normal in size with greater than 50% respiratory variability, suggesting right atrial pressure of 3 mmHg. IAS/Shunts: The atrial septum is grossly normal.  LEFT VENTRICLE PLAX 2D LVIDd:         3.70 cm  Diastology LVIDs:         2.20 cm  LV e' medial:    5.77 cm/s LV PW:         1.00 cm  LV E/e' medial:  12.1 LV IVS:        1.50 cm  LV e' lateral:   5.00 cm/s LVOT diam:     1.90 cm  LV E/e' lateral: 14.0 LV SV:         61 LV SV Index:   32       2D Longitudinal Strain LVOT Area:     2.84 cm 2D Strain GLS (A2C):   -17.5 %                         2D Strain GLS (A3C):   -19.4 %                         2D Strain GLS Avg:     -18.5 % RIGHT VENTRICLE RV S prime:     13.80 cm/s TAPSE (M-mode): 2.2 cm LEFT ATRIUM             Index       RIGHT ATRIUM           Index LA  diam:        3.20 cm 1.65 cm/m  RA Area:     10.50 cm LA Vol (A2C):   33.8 ml 17.48 ml/m RA Volume:   22.30 ml  11.53 ml/m LA Vol (A4C):   23.1 ml 11.94 ml/m LA Biplane Vol: 29.8 ml 15.41 ml/m  AORTIC VALVE LVOT Vmax:   119.00 cm/s LVOT Vmean:  84.100 cm/s LVOT VTI:    0.216 m  AORTA Ao Root diam: 2.80 cm Ao Asc diam:  2.10 cm MITRAL VALVE MV Area (PHT): 3.37 cm    SHUNTS MV Decel Time: 225 msec    Systemic VTI:  0.22 m MV E velocity: 69.80 cm/s  Systemic Diam: 1.90 cm MV A velocity: 83.60 cm/s MV E/A ratio:  0.83 Rudean Haskell MD Electronically signed by Rudean Haskell MD Signature Date/Time: 08/08/2020/3:20:40 PM  Final

## 2020-08-16 NOTE — Assessment & Plan Note (Signed)
She has not been taking oral magnesium supplement I encouraged her to start We will give her IV magnesium today

## 2020-08-16 NOTE — Patient Instructions (Signed)

## 2020-08-16 NOTE — Progress Notes (Signed)
CRITICAL VALUE STICKER  CRITICAL VALUE: Potassium 2.7  RECEIVER (on-site recipient of call): Harrel Lemon, RN  [place] NOTIFIED: 2111 am on 08/16/20  MESSENGER (representative from lab): Dorian Furnace  MD NOTIFIED: Dr. Alvy Bimler  TIME OF NOTIFICATION: 5520 am on 08/16/20  RESPONSE: DR. Alvy Bimler will order replacement while in the infusion room today.

## 2020-08-16 NOTE — Assessment & Plan Note (Signed)
I gave her a list of food high in potassium We will also give her potassium replacement therapy today

## 2020-08-16 NOTE — Assessment & Plan Note (Signed)
Her blood pressure is elevated but could be exacerbated by anxiety Observe closely for now

## 2020-08-16 NOTE — Progress Notes (Signed)
Patient waited her full 30 minute post iron observation time with no issues VSS upon discharge. AVS was reveiwed and printed for patient

## 2020-08-16 NOTE — Assessment & Plan Note (Signed)
I recommend she seek attention with a local dentist for evaluation If she needs dental extraction, the safest time to do it would be at the end of the month

## 2020-08-16 NOTE — Assessment & Plan Note (Signed)
Clinically, she has responded to treatment However, her treatment course is complicated by significant electrolyte imbalance and anemia We will continue aggressive supportive care

## 2020-08-19 ENCOUNTER — Other Ambulatory Visit: Payer: Self-pay | Admitting: Hematology and Oncology

## 2020-08-19 DIAGNOSIS — C778 Secondary and unspecified malignant neoplasm of lymph nodes of multiple regions: Secondary | ICD-10-CM

## 2020-08-19 DIAGNOSIS — C541 Malignant neoplasm of endometrium: Secondary | ICD-10-CM

## 2020-08-19 DIAGNOSIS — C7800 Secondary malignant neoplasm of unspecified lung: Secondary | ICD-10-CM

## 2020-08-21 ENCOUNTER — Other Ambulatory Visit: Payer: Self-pay | Admitting: Hematology and Oncology

## 2020-08-22 ENCOUNTER — Other Ambulatory Visit: Payer: Self-pay | Admitting: Hematology and Oncology

## 2020-08-24 ENCOUNTER — Telehealth: Payer: Self-pay

## 2020-08-24 ENCOUNTER — Other Ambulatory Visit: Payer: Self-pay | Admitting: Hematology and Oncology

## 2020-08-24 NOTE — Telephone Encounter (Signed)
She called and left a message to call her with next appt. Called back and left a message with next appts. Ask her to call the office if needed.

## 2020-08-24 NOTE — Telephone Encounter (Signed)
Called back and reviewed upcoming appts. She verbalized understanding.

## 2020-08-25 ENCOUNTER — Encounter: Payer: 59 | Admitting: Nutrition

## 2020-08-25 ENCOUNTER — Ambulatory Visit: Payer: 59

## 2020-08-25 ENCOUNTER — Other Ambulatory Visit: Payer: 59

## 2020-08-25 ENCOUNTER — Ambulatory Visit: Payer: 59 | Admitting: Hematology and Oncology

## 2020-08-26 ENCOUNTER — Other Ambulatory Visit: Payer: Self-pay

## 2020-08-26 ENCOUNTER — Ambulatory Visit (HOSPITAL_BASED_OUTPATIENT_CLINIC_OR_DEPARTMENT_OTHER): Payer: 59 | Admitting: Medical

## 2020-08-26 ENCOUNTER — Inpatient Hospital Stay: Payer: 59

## 2020-08-26 ENCOUNTER — Other Ambulatory Visit: Payer: Self-pay | Admitting: Medical

## 2020-08-26 DIAGNOSIS — M62838 Other muscle spasm: Secondary | ICD-10-CM

## 2020-08-26 DIAGNOSIS — C778 Secondary and unspecified malignant neoplasm of lymph nodes of multiple regions: Secondary | ICD-10-CM

## 2020-08-26 DIAGNOSIS — C541 Malignant neoplasm of endometrium: Secondary | ICD-10-CM

## 2020-08-26 DIAGNOSIS — C7800 Secondary malignant neoplasm of unspecified lung: Secondary | ICD-10-CM

## 2020-08-26 DIAGNOSIS — E876 Hypokalemia: Secondary | ICD-10-CM

## 2020-08-26 DIAGNOSIS — Z5111 Encounter for antineoplastic chemotherapy: Secondary | ICD-10-CM | POA: Diagnosis not present

## 2020-08-26 LAB — CMP (CANCER CENTER ONLY)
ALT: 10 U/L (ref 0–44)
AST: 12 U/L — ABNORMAL LOW (ref 15–41)
Albumin: 3.2 g/dL — ABNORMAL LOW (ref 3.5–5.0)
Alkaline Phosphatase: 74 U/L (ref 38–126)
Anion gap: 11 (ref 5–15)
BUN: 12 mg/dL (ref 8–23)
CO2: 32 mmol/L (ref 22–32)
Calcium: 9.1 mg/dL (ref 8.9–10.3)
Chloride: 95 mmol/L — ABNORMAL LOW (ref 98–111)
Creatinine: 0.89 mg/dL (ref 0.44–1.00)
GFR, Estimated: >60 mL/min (ref >60)
Glucose, Bld: 148 mg/dL — ABNORMAL HIGH (ref 70–99)
Potassium: 2.8 mmol/L — ABNORMAL LOW (ref 3.5–5.1)
Sodium: 138 mmol/L (ref 135–145)
Total Bilirubin: 0.4 mg/dL (ref 0.3–1.2)
Total Protein: 6.8 g/dL (ref 6.5–8.1)

## 2020-08-26 LAB — CBC WITH DIFFERENTIAL (CANCER CENTER ONLY)
Abs Immature Granulocytes: 0.04 10*3/uL (ref 0.00–0.07)
Basophils Absolute: 0 10*3/uL (ref 0.0–0.1)
Basophils Relative: 0 %
Eosinophils Absolute: 0 10*3/uL (ref 0.0–0.5)
Eosinophils Relative: 0 %
HCT: 25.7 % — ABNORMAL LOW (ref 36.0–46.0)
Hemoglobin: 8 g/dL — ABNORMAL LOW (ref 12.0–15.0)
Immature Granulocytes: 1 %
Lymphocytes Relative: 32 %
Lymphs Abs: 1.4 10*3/uL (ref 0.7–4.0)
MCH: 23.9 pg — ABNORMAL LOW (ref 26.0–34.0)
MCHC: 31.1 g/dL (ref 30.0–36.0)
MCV: 76.7 fL — ABNORMAL LOW (ref 80.0–100.0)
Monocytes Absolute: 0.7 10*3/uL (ref 0.1–1.0)
Monocytes Relative: 17 %
Neutro Abs: 2.1 10*3/uL (ref 1.7–7.7)
Neutrophils Relative %: 50 %
Platelet Count: 133 10*3/uL — ABNORMAL LOW (ref 150–400)
RBC: 3.35 MIL/uL — ABNORMAL LOW (ref 3.87–5.11)
RDW: 28.4 % — ABNORMAL HIGH (ref 11.5–15.5)
WBC Count: 4.2 10*3/uL (ref 4.0–10.5)
nRBC: 0 % (ref 0.0–0.2)

## 2020-08-26 LAB — MAGNESIUM: Magnesium: 1.3 mg/dL — ABNORMAL LOW (ref 1.7–2.4)

## 2020-08-26 MED ORDER — POTASSIUM CHLORIDE 10 MEQ/100ML IV SOLN
10.0000 meq | INTRAVENOUS | Status: AC
  Administered 2020-08-26 (×4): 10 meq via INTRAVENOUS

## 2020-08-26 MED ORDER — SODIUM CHLORIDE 0.9 % IV SOLN
Freq: Once | INTRAVENOUS | Status: AC
Start: 2020-08-26 — End: 2020-08-26
  Filled 2020-08-26: qty 250

## 2020-08-26 MED ORDER — SODIUM CHLORIDE 0.9 % IV SOLN
300.0000 mg | Freq: Once | INTRAVENOUS | Status: AC
  Administered 2020-08-26: 300 mg via INTRAVENOUS
  Filled 2020-08-26: qty 300

## 2020-08-26 MED ORDER — POTASSIUM CHLORIDE 10 MEQ/100ML IV SOLN: 10.0000 meq | INTRAVENOUS | Status: DC

## 2020-08-26 MED ORDER — MAGNESIUM SULFATE 2 GM/50ML IV SOLN
INTRAVENOUS | Status: AC
  Filled 2020-08-26: qty 100

## 2020-08-26 MED ORDER — SODIUM CHLORIDE 0.9% FLUSH
10.0000 mL | Freq: Once | INTRAVENOUS | Status: AC | PRN
  Administered 2020-08-26: 10 mL
  Filled 2020-08-26: qty 10

## 2020-08-26 MED ORDER — HEPARIN SOD (PORK) LOCK FLUSH 100 UNIT/ML IV SOLN
500.0000 [IU] | Freq: Once | INTRAVENOUS | Status: AC | PRN
  Administered 2020-08-26: 500 [IU]
  Filled 2020-08-26: qty 5

## 2020-08-26 MED ORDER — SODIUM CHLORIDE 0.9% FLUSH
10.0000 mL | Freq: Once | INTRAVENOUS | Status: AC
  Administered 2020-08-26: 10 mL
  Filled 2020-08-26: qty 10

## 2020-08-26 MED ORDER — POTASSIUM CHLORIDE 10 MEQ/100ML IV SOLN
INTRAVENOUS | Status: AC
  Filled 2020-08-26: qty 400

## 2020-08-26 MED ORDER — METHOCARBAMOL 500 MG PO TABS: 500.0000 mg | ORAL_TABLET | Freq: Four times a day (QID) | ORAL | 0 refills | Status: DC | PRN

## 2020-08-26 MED ORDER — MAGNESIUM SULFATE 2 GM/50ML IV SOLN
2.0000 g | INTRAVENOUS | Status: AC
  Administered 2020-08-26 (×2): 2 g via INTRAVENOUS

## 2020-08-26 NOTE — Patient Instructions (Signed)
Iron Sucrose injection What is this medicine? IRON SUCROSE (AHY ern SOO krohs) is an iron complex. Iron is used to make healthy red blood cells, which carry oxygen and nutrients throughout the body. This medicine is used to treat iron deficiency anemia in people with chronic kidney disease. This medicine may be used for other purposes; ask your health care provider or pharmacist if you have questions. COMMON BRAND NAME(S): Venofer What should I tell my health care provider before I take this medicine? They need to know if you have any of these conditions:  anemia not caused by low iron levels  heart disease  high levels of iron in the blood  kidney disease  liver disease  an unusual or allergic reaction to iron, other medicines, foods, dyes, or preservatives  pregnant or trying to get pregnant  breast-feeding How should I use this medicine? This medicine is for infusion into a vein. It is given by a health care professional in a hospital or clinic setting. Talk to your pediatrician regarding the use of this medicine in children. While this drug may be prescribed for children as young as 2 years for selected conditions, precautions do apply. Overdosage: If you think you have taken too much of this medicine contact a poison control center or emergency room at once. NOTE: This medicine is only for you. Do not share this medicine with others. What if I miss a dose? It is important not to miss your dose. Call your doctor or health care professional if you are unable to keep an appointment. What may interact with this medicine? Do not take this medicine with any of the following medications:  deferoxamine  dimercaprol  other iron products This medicine may also interact with the following medications:  chloramphenicol  deferasirox This list may not describe all possible interactions. Give your health care provider a list of all the medicines, herbs, non-prescription drugs, or  dietary supplements you use. Also tell them if you smoke, drink alcohol, or use illegal drugs. Some items may interact with your medicine. What should I watch for while using this medicine? Visit your doctor or healthcare professional regularly. Tell your doctor or healthcare professional if your symptoms do not start to get better or if they get worse. You may need blood work done while you are taking this medicine. You may need to follow a special diet. Talk to your doctor. Foods that contain iron include: whole grains/cereals, dried fruits, beans, or peas, leafy green vegetables, and organ meats (liver, kidney). What side effects may I notice from receiving this medicine? Side effects that you should report to your doctor or health care professional as soon as possible:  allergic reactions like skin rash, itching or hives, swelling of the face, lips, or tongue  breathing problems  changes in blood pressure  cough  fast, irregular heartbeat  feeling faint or lightheaded, falls  fever or chills  flushing, sweating, or hot feelings  joint or muscle aches/pains  seizures  swelling of the ankles or feet  unusually weak or tired Side effects that usually do not require medical attention (report to your doctor or health care professional if they continue or are bothersome):  diarrhea  feeling achy  headache  irritation at site where injected  nausea, vomiting  stomach upset  tiredness This list may not describe all possible side effects. Call your doctor for medical advice about side effects. You may report side effects to FDA at 1-800-FDA-1088. Where should I keep   my medicine? This drug is given in a hospital or clinic and will not be stored at home. NOTE: This sheet is a summary. It may not cover all possible information. If you have questions about this medicine, talk to your doctor, pharmacist, or health care provider.  2021 Elsevier/Gold Standard (2011-02-01  17:14:35)   Hypomagnesemia Hypomagnesemia is a condition in which the level of magnesium in the blood is low. Magnesium is a mineral that is found in many foods. It is used in many different processes in the body. Hypomagnesemia can affect every organ in the body. In severe cases, it can cause life-threatening problems. What are the causes? This condition may be caused by:  Not getting enough magnesium in your diet.  Malnutrition.  Problems with absorbing magnesium from the intestines.  Dehydration.  Alcohol abuse.  Vomiting.  Severe or chronic diarrhea.  Some medicines, including medicines that make you urinate more (diuretics).  Certain diseases, such as kidney disease, diabetes, celiac disease, and overactive thyroid. What are the signs or symptoms? Symptoms of this condition include:  Loss of appetite.  Nausea and vomiting.  Involuntary shaking or trembling of a body part (tremor).  Muscle weakness.  Tingling in the arms and legs.  Sudden tightening of muscles (muscle spasms).  Confusion.  Psychiatric issues, such as depression, irritability, or psychosis.  A feeling of fluttering of the heart.  Seizures. These symptoms are more severe if magnesium levels drop suddenly. How is this diagnosed? This condition may be diagnosed based on:  Your symptoms and medical history.  A physical exam.  Blood and urine tests. How is this treated? Treatment depends on the cause and the severity of the condition. It may be treated with:  A magnesium supplement. This can be taken in pill form. If the condition is severe, magnesium is usually given through an IV.  Changes to your diet. You may be directed to eat foods that have a lot of magnesium, such as green leafy vegetables, peas, beans, and nuts.  Stopping any intake of alcohol.   Follow these instructions at home:  Make sure that your diet includes foods with magnesium. Foods that have a lot of magnesium in  them include: ? Green leafy vegetables, such as spinach and broccoli. ? Beans and peas. ? Nuts and seeds, such as almonds and sunflower seeds. ? Whole grains, such as whole grain bread and fortified cereals.  Take magnesium supplements if your health care provider tells you to do that. Take them as directed.  Take over-the-counter and prescription medicines only as told by your health care provider.  Have your magnesium levels monitored as told by your health care provider.  When you are active, drink fluids that contain electrolytes.  Avoid drinking alcohol.  Keep all follow-up visits as told by your health care provider. This is important.      Contact a health care provider if:  You get worse instead of better.  Your symptoms return. Get help right away if you:  Develop severe muscle weakness.  Have trouble breathing.  Feel that your heart is racing. Summary  Hypomagnesemia is a condition in which the level of magnesium in the blood is low.  Hypomagnesemia can affect every organ in the body.  Treatment may include eating more foods that contain magnesium, taking magnesium supplements, and not drinking alcohol.  Have your magnesium levels monitored as told by your health care provider. This information is not intended to replace advice given to you by your  health care provider. Make sure you discuss any questions you have with your health care provider. Document Revised: 09/24/2019 Document Reviewed: 09/24/2019 Elsevier Patient Education  2021 [employer].   Hypokalemia Hypokalemia means that the amount of potassium in the blood is lower than normal. Potassium is a chemical (electrolyte) that helps regulate the amount of fluid in the body. It also stimulates muscle tightening (contraction) and helps nerves work properly. Normally, most of the body's potassium is inside cells, and only a very small amount is in the blood. Because the amount in the blood is so small,  minor changes to potassium levels in the blood can be life-threatening. What are the causes? This condition may be caused by:  Antibiotic medicine.  Diarrhea or vomiting. Taking too much of a medicine that helps you have a bowel movement (laxative) can cause diarrhea and lead to hypokalemia.  Chronic kidney disease (CKD).  Medicines that help the body get rid of excess fluid (diuretics).  Eating disorders, such as bulimia.  Low magnesium levels in the body.  Sweating a lot. What are the signs or symptoms? Symptoms of this condition include:  Weakness.  Constipation.  Fatigue.  Muscle cramps.  Mental confusion.  Skipped heartbeats or irregular heartbeat (palpitations).  Tingling or numbness. How is this diagnosed? This condition is diagnosed with a blood test. How is this treated? This condition may be treated by:  Taking potassium supplements by mouth.  Adjusting the medicines that you take.  Eating more foods that contain a lot of potassium. If your potassium level is very low, you may need to get potassium through an IV and be monitored in the hospital. Follow these instructions at home:  Take over-the-counter and prescription medicines only as told by your health care provider. This includes vitamins and supplements.  Eat a healthy diet. A healthy diet includes fresh fruits and vegetables, whole grains, healthy fats, and lean proteins.  If instructed, eat more foods that contain a lot of potassium. This includes: ? Nuts, such as peanuts and pistachios. ? Seeds, such as sunflower seeds and pumpkin seeds. ? Peas, lentils, and lima beans. ? Whole grain and bran cereals and breads. ? Fresh fruits and vegetables, such as apricots, avocado, bananas, cantaloupe, kiwi, oranges, tomatoes, asparagus, and potatoes. ? Orange juice. ? Tomato juice. ? Red meats. ? Yogurt.  Keep all follow-up visits as told by your health care provider. This is important.   Contact a  health care provider if you:  Have weakness that gets worse.  Feel your heart pounding or racing.  Vomit.  Have diarrhea.  Have diabetes (diabetes mellitus) and you have trouble keeping your blood sugar (glucose) in your target range. Get help right away if you:  Have chest pain.  Have shortness of breath.  Have vomiting or diarrhea that lasts for more than 2 days.  Faint. Summary  Hypokalemia means that the amount of potassium in the blood is lower than normal.  This condition is diagnosed with a blood test.  Hypokalemia may be treated by taking potassium supplements, adjusting the medicines that you take, or eating more foods that are high in potassium.  If your potassium level is very low, you may need to get potassium through an IV and be monitored in the hospital. This information is not intended to replace advice given to you by your health care provider. Make sure you discuss any questions you have with your health care provider. Document Revised: 12/04/2017 Document Reviewed: 12/04/2017 Elsevier Patient  Education  2021 [employer].

## 2020-08-26 NOTE — Progress Notes (Signed)
Today's labs:   Potassium 2.8 Magnesium 1.3  Received orders to give Magnesium 4 gm IVPB x 1 and Potassium chloride 40 meq IVPB x 1.  Orders entered as above,  T.O. Dr Ellin Saba, RN/Ewa Hipp Ronnald Ramp, PharmD 08/26/20 @ 1040

## 2020-09-01 ENCOUNTER — Inpatient Hospital Stay: Payer: 59 | Admitting: Nutrition

## 2020-09-01 ENCOUNTER — Inpatient Hospital Stay (HOSPITAL_BASED_OUTPATIENT_CLINIC_OR_DEPARTMENT_OTHER): Payer: 59 | Admitting: Hematology and Oncology

## 2020-09-01 ENCOUNTER — Inpatient Hospital Stay: Payer: 59

## 2020-09-01 ENCOUNTER — Other Ambulatory Visit: Payer: Self-pay

## 2020-09-01 ENCOUNTER — Encounter: Payer: Self-pay | Admitting: Hematology and Oncology

## 2020-09-01 VITALS — BP 152/72 | HR 95 | Temp 98.1°F | Resp 18

## 2020-09-01 DIAGNOSIS — C7800 Secondary malignant neoplasm of unspecified lung: Secondary | ICD-10-CM

## 2020-09-01 DIAGNOSIS — C778 Secondary and unspecified malignant neoplasm of lymph nodes of multiple regions: Secondary | ICD-10-CM

## 2020-09-01 DIAGNOSIS — C541 Malignant neoplasm of endometrium: Secondary | ICD-10-CM

## 2020-09-01 DIAGNOSIS — D61818 Other pancytopenia: Secondary | ICD-10-CM

## 2020-09-01 DIAGNOSIS — R634 Abnormal weight loss: Secondary | ICD-10-CM

## 2020-09-01 DIAGNOSIS — I10 Essential (primary) hypertension: Secondary | ICD-10-CM | POA: Diagnosis not present

## 2020-09-01 DIAGNOSIS — Z5111 Encounter for antineoplastic chemotherapy: Secondary | ICD-10-CM | POA: Diagnosis not present

## 2020-09-01 DIAGNOSIS — E876 Hypokalemia: Secondary | ICD-10-CM

## 2020-09-01 LAB — CMP (CANCER CENTER ONLY)
ALT: 8 U/L (ref 0–44)
AST: 11 U/L — ABNORMAL LOW (ref 15–41)
Albumin: 3.4 g/dL — ABNORMAL LOW (ref 3.5–5.0)
Alkaline Phosphatase: 87 U/L (ref 38–126)
Anion gap: 13 (ref 5–15)
BUN: 13 mg/dL (ref 8–23)
CO2: 26 mmol/L (ref 22–32)
Calcium: 9.5 mg/dL (ref 8.9–10.3)
Chloride: 102 mmol/L (ref 98–111)
Creatinine: 0.91 mg/dL (ref 0.44–1.00)
GFR, Estimated: >60 mL/min (ref >60)
Glucose, Bld: 177 mg/dL — ABNORMAL HIGH (ref 70–99)
Potassium: 3.3 mmol/L — ABNORMAL LOW (ref 3.5–5.1)
Sodium: 141 mmol/L (ref 135–145)
Total Bilirubin: 0.5 mg/dL (ref 0.3–1.2)
Total Protein: 7.1 g/dL (ref 6.5–8.1)

## 2020-09-01 LAB — CBC WITH DIFFERENTIAL (CANCER CENTER ONLY)
Abs Immature Granulocytes: 0.04 10*3/uL (ref 0.00–0.07)
Basophils Absolute: 0 10*3/uL (ref 0.0–0.1)
Basophils Relative: 0 %
Eosinophils Absolute: 0 10*3/uL (ref 0.0–0.5)
Eosinophils Relative: 0 %
HCT: 26 % — ABNORMAL LOW (ref 36.0–46.0)
Hemoglobin: 8.3 g/dL — ABNORMAL LOW (ref 12.0–15.0)
Immature Granulocytes: 1 %
Lymphocytes Relative: 8 %
Lymphs Abs: 0.5 10*3/uL — ABNORMAL LOW (ref 0.7–4.0)
MCH: 24.8 pg — ABNORMAL LOW (ref 26.0–34.0)
MCHC: 31.9 g/dL (ref 30.0–36.0)
MCV: 77.6 fL — ABNORMAL LOW (ref 80.0–100.0)
Monocytes Absolute: 0.2 10*3/uL (ref 0.1–1.0)
Monocytes Relative: 3 %
Neutro Abs: 5.6 10*3/uL (ref 1.7–7.7)
Neutrophils Relative %: 88 %
Platelet Count: 136 10*3/uL — ABNORMAL LOW (ref 150–400)
RBC: 3.35 MIL/uL — ABNORMAL LOW (ref 3.87–5.11)
RDW: 30.3 % — ABNORMAL HIGH (ref 11.5–15.5)
WBC Count: 6.3 10*3/uL (ref 4.0–10.5)
nRBC: 0 % (ref 0.0–0.2)

## 2020-09-01 LAB — MAGNESIUM: Magnesium: 1.2 mg/dL — ABNORMAL LOW (ref 1.7–2.4)

## 2020-09-01 MED ORDER — MAGNESIUM SULFATE 4 GM/100ML IV SOLN
4.0000 g | Freq: Once | INTRAVENOUS | Status: AC
  Administered 2020-09-01: 4 g via INTRAVENOUS
  Filled 2020-09-01: qty 100

## 2020-09-01 MED ORDER — PALONOSETRON HCL INJECTION 0.25 MG/5ML
0.2500 mg | Freq: Once | INTRAVENOUS | Status: AC
Start: 2020-09-01 — End: 2020-09-01
  Administered 2020-09-01: 0.25 mg via INTRAVENOUS

## 2020-09-01 MED ORDER — MAGNESIUM OXIDE 400 MG PO CAPS: 400.0000 mg | ORAL_CAPSULE | Freq: Two times a day (BID) | ORAL | 3 refills | Status: DC

## 2020-09-01 MED ORDER — FAMOTIDINE IN NACL 20-0.9 MG/50ML-% IV SOLN
20.0000 mg | Freq: Once | INTRAVENOUS | Status: AC
Start: 2020-09-01 — End: 2020-09-01
  Administered 2020-09-01: 20 mg via INTRAVENOUS

## 2020-09-01 MED ORDER — SODIUM CHLORIDE 0.9 % IV SOLN
Freq: Once | INTRAVENOUS | Status: AC
  Filled 2020-09-01: qty 250

## 2020-09-01 MED ORDER — SODIUM CHLORIDE 0.9 % IV SOLN
150.0000 mg | Freq: Once | INTRAVENOUS | Status: AC
  Administered 2020-09-01: 150 mg via INTRAVENOUS
  Filled 2020-09-01: qty 150

## 2020-09-01 MED ORDER — SODIUM CHLORIDE 0.9% FLUSH
10.0000 mL | Freq: Once | INTRAVENOUS | Status: AC
  Administered 2020-09-01: 10 mL
  Filled 2020-09-01: qty 10

## 2020-09-01 MED ORDER — SODIUM CHLORIDE 0.9 % IV SOLN
10.0000 mg | Freq: Once | INTRAVENOUS | Status: AC
  Administered 2020-09-01: 10 mg via INTRAVENOUS
  Filled 2020-09-01: qty 10

## 2020-09-01 MED ORDER — ACETAMINOPHEN 325 MG PO TABS
650.0000 mg | ORAL_TABLET | Freq: Once | ORAL | Status: AC
  Administered 2020-09-01: 650 mg via ORAL

## 2020-09-01 MED ORDER — SODIUM CHLORIDE 0.9 % IV SOLN
531.0000 mg | Freq: Once | INTRAVENOUS | Status: AC
  Administered 2020-09-01: 530 mg via INTRAVENOUS
  Filled 2020-09-01: qty 53

## 2020-09-01 MED ORDER — PALONOSETRON HCL INJECTION 0.25 MG/5ML
INTRAVENOUS | Status: AC
  Filled 2020-09-01: qty 5

## 2020-09-01 MED ORDER — ACETAMINOPHEN 325 MG PO TABS
ORAL_TABLET | ORAL | Status: AC
  Filled 2020-09-01: qty 2

## 2020-09-01 MED ORDER — DIPHENHYDRAMINE HCL 50 MG/ML IJ SOLN
INTRAMUSCULAR | Status: AC
  Filled 2020-09-01: qty 1

## 2020-09-01 MED ORDER — SODIUM CHLORIDE 0.9 % IV SOLN
105.0000 mg/m2 | Freq: Once | INTRAVENOUS | Status: AC
  Administered 2020-09-01: 210 mg via INTRAVENOUS
  Filled 2020-09-01: qty 35

## 2020-09-01 MED ORDER — TRASTUZUMAB-ANNS CHEMO 150 MG IV SOLR
600.0000 mg | Freq: Once | INTRAVENOUS | Status: AC
  Administered 2020-09-01: 600 mg via INTRAVENOUS
  Filled 2020-09-01: qty 28.57

## 2020-09-01 MED ORDER — DIPHENHYDRAMINE HCL 50 MG/ML IJ SOLN
25.0000 mg | Freq: Once | INTRAMUSCULAR | Status: AC
  Administered 2020-09-01: 25 mg via INTRAVENOUS

## 2020-09-01 MED ORDER — FAMOTIDINE IN NACL 20-0.9 MG/50ML-% IV SOLN
INTRAVENOUS | Status: AC
  Filled 2020-09-01: qty 50

## 2020-09-01 NOTE — Assessment & Plan Note (Signed)
She has lost some weight recently I will adjust the dose of her treatment

## 2020-09-01 NOTE — Progress Notes (Signed)
Ms. Amanda Lin was seen in the infusion room air when she was receiving magnesium and potassium.  She reported some pain in her left SCM.  The reevaluation was completed and the patient was noted to have what appeared to be a muscle spasm.  She was given a prescription for Robaxin.  There were no other additional issues of concern.  Sandi Mealy, MHS, PA-C Physician Assistant

## 2020-09-01 NOTE — Progress Notes (Signed)
Nutrition follow-up completed with patient during infusion for metastatic endometrial cancer. Weight decreased and documented as 207.8 pounds on April 28 down from 214.6 pounds March 23.   Patient has had an 8% weight loss over 7 weeks.  This is significant. Labs noted: Potassium 3.3, glucose 177, albumin 3.4. Patient reports she had nausea and vomiting last night but it did get better after taking nausea medication. She feels like she is eating better. She denies issues with her bowels.  She has no questions or concerns.  Nutrition diagnosis: Unintended weight loss continues.  Intervention: Educated patient to continue strategies for increasing calories and protein in small frequent meals and snacks. Continue nausea medication as needed for ongoing nausea and vomiting. Recommended increase clear liquid oral nutrition supplements to provide additional calories and protein.  Monitoring, evaluation, goals: Patient will tolerate increased calories and protein to maintain lean body mass.  Next visit: Thursday, May 19 during infusion.  **Disclaimer: This note was dictated with voice recognition software. Similar sounding words can inadvertently be transcribed and this note may contain transcription errors which may not have been corrected upon publication of note.**

## 2020-09-01 NOTE — Assessment & Plan Note (Signed)
Clinically, she has responded to treatment However, her treatment course is complicated by significant electrolyte imbalance and anemia Recently, she has received aggressive electrolyte replacement therapy with IV magnesium, IV potassium as well as intravenous iron infusion With persistent anemia and weight loss, I have adjusted the dose of her chemotherapy a little bit further Plans to reduce carboplatin to AUC of 4.5 We will continue aggressive supportive care

## 2020-09-01 NOTE — Patient Instructions (Signed)
[facility]  Discharge Instructions: Thank you for choosing [facility] to provide your oncology and hematology care.   If you have a lab appointment with the [facility], please go directly to the [facility] and check in at the registration area.   Wear comfortable clothing and clothing appropriate for easy access to any Portacath or PICC line.   We strive to give you quality time with your provider. You may need to reschedule your appointment if you arrive late (15 or more minutes).  Arriving late affects you and other patients whose appointments are after yours.  Also, if you miss three or more appointments without notifying the office, you may be dismissed from the clinic at the provider's discretion.      For prescription refill requests, have your pharmacy contact our office and allow 72 hours for refills to be completed.    Today you received the following chemotherapy and/or immunotherapy agents Traztuzumab, Paclitaxel, Carboplatin.      To help prevent nausea and vomiting after your treatment, we encourage you to take your nausea medication as directed.  BELOW ARE SYMPTOMS THAT SHOULD BE REPORTED IMMEDIATELY: . *FEVER GREATER THAN 100.4 F (38 C) OR HIGHER . *CHILLS OR SWEATING . *NAUSEA AND VOMITING THAT IS NOT CONTROLLED WITH YOUR NAUSEA MEDICATION . *UNUSUAL SHORTNESS OF BREATH . *UNUSUAL BRUISING OR BLEEDING . *URINARY PROBLEMS (pain or burning when urinating, or frequent urination) . *BOWEL PROBLEMS (unusual diarrhea, constipation, pain near the anus) . TENDERNESS IN MOUTH AND THROAT WITH OR WITHOUT PRESENCE OF ULCERS (sore throat, sores in mouth, or a toothache) . UNUSUAL RASH, SWELLING OR PAIN  . UNUSUAL VAGINAL DISCHARGE OR ITCHING   Items with * indicate a potential emergency and should be followed up as soon as possible or go to the Emergency Department if any problems should occur.  Please show the CHEMOTHERAPY ALERT  CARD or IMMUNOTHERAPY ALERT CARD at check-in to the Emergency Department and triage nurse.  Should you have questions after your visit or need to cancel or reschedule your appointment, please contact [facility]  Dept: [phone]  and follow the prompts.  Office hours are 8:00 a.m. to 4:30 p.m. Monday - Friday. Please note that voicemails left after 4:00 p.m. may not be returned until the following business day.  We are closed weekends and major holidays. You have access to a nurse at all times for urgent questions. Please call the main number to the clinic Dept: [phone] and follow the prompts.   For any non-urgent questions, you may also contact your provider using MyChart. We now offer e-Visits for anyone 64 and older to request care online for non-urgent symptoms. For details visit [email or web address].   Also download the MyChart app! Go to the app store, search "MyChart", open the app, select [facility], and log in with your MyChart username and password.  Due to Covid, a mask is required upon entering the hospital/clinic. If you do not have a mask, one will be given to you upon arrival. For doctor visits, patients may have 1 support person aged 28 or older with them. For treatment visits, patients cannot have anyone with them due to current Covid guidelines and our immunocompromised population.

## 2020-09-01 NOTE — Assessment & Plan Note (Signed)
She has persistent hypomagnesemia I will order IV magnesium replacement I will increase oral magnesium supplement I will send prescription to her pharmacy

## 2020-09-01 NOTE — Progress Notes (Signed)
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[facility]) Clinically, she has responded to treatment However, her treatment course is complicated by significant electrolyte imbalance and anemia Recently, she has received aggressive electrolyte replacement therapy with IV magnesium, IV potassium as well as intravenous iron infusion With persistent anemia and weight loss, I have adjusted the dose of her chemotherapy a little bit further Plans to reduce carboplatin to AUC of 4.5 We will continue aggressive supportive care  Pancytopenia, acquired [facility]) She has profound pancytopenia due to side effects of chemotherapy As above, I plan to reduce the dose of carboplatin a little bit She has received multiple doses of IV iron recently She does not need transfusion support  Hypomagnesemia She has persistent hypomagnesemia I will order IV magnesium replacement I will increase oral magnesium supplement I will send prescription to her pharmacy  Essential hypertension Her blood pressure is mildly elevated but she is not symptomatic Observe closely for now  Hypokalemia, inadequate intake Her potassium is much improved She does not need IV potassium replacement Continue to encourage good oral intake  Weight loss She has lost some weight recently I will adjust the dose of her treatment   No orders of the defined types were placed in this encounter.   All questions were answered. The patient knows to call the clinic with any problems, questions or concerns. The total time spent in the appointment was 40 minutes encounter with patients including review of chart and various tests results, discussions about plan of care and coordination of care plan   [name], MD 09/01/2020 10:58 AM  INTERVAL  HISTORY: Please see below for problem oriented charting. She returns for further follow-up She feels okay She denies pain Her appetite is fair and she is eating better although her calculated weight was down She denies recent nausea or constipation with treatment Denies chest pain or shortness of breath The patient denies any recent signs or symptoms of bleeding such as spontaneous epistaxis, hematuria or hematochezia.   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     02/26/2020 Imaging   1. 3 mm nonobstructing renal stone within the left kidney. 2. Findings suggestive of a small hemorrhagic cyst within the lower pole of the right kidney, with an anterior right lower pole heterogeneous renal soft tissue mass. MRI correlation is recommended, as an underlying neoplastic process cannot be excluded. 3. Colonic diverticulosis. 4. Multiple exophytic uterine fibroids. 5. Evidence of prior cholecystectomy.   03/28/2020 Imaging   2.4 cm enhancing mass in the anterior right lower kidney, suspicious for solid renal neoplasm such as renal cell carcinoma.   Single right renal artery and vein.  No renal vein invasion.   Small retroperitoneal nodes, measuring up to 9 mm short axis, mildly prominent but technically within the upper limits of normal. Attention on follow-up is suggested.   4 x 5 mm nodule in the medial right lower lobe. This is nonspecific and unlikely to reflect a metastasis but warrants attention on follow-up. Consider dedicated CT chest in 3-6 months.   3 mm nonobstructing left lower pole renal calculus. No hydronephrosis.     04/04/2020 Imaging  1. 10 mm endometrial stripe thickness. This is considered abnormal in a postmenopausal female with bleeding. In the setting of  post-menopausal bleeding, endometrial sampling is indicated to exclude carcinoma.  2. Multiple uterine fibroids including potential submucosal fibroid in the lower uterine segment.   04/19/2020 Pathology Results   A. ENDOMETRIUM, BIOPSY:  -  High-grade carcinoma  Immunohistochemical and morphometric analysis performed manually   The tumor cells are POSITIVE for Her2 (3+).    04/19/2020 Procedure   She was seen by Dr. Astrid Drafts on 04/19/2020.  At that visit her cervix was noted to be nodular and firm and abnormal appearing.  Pap taken at that visit was positive for adenocarcinoma, endometrial.  An endometrial Pipelle biopsy was taken at the same visit which revealed high-grade endometrial cancer, suspicious for serous carcinoma.     05/12/2020 Cancer Staging   Staging form: Corpus Uteri - Carcinoma and Carcinosarcoma, AJCC 8th Edition - Clinical stage from 05/12/2020: FIGO Stage IVB (cT3, cN2, cM1) - Signed by [name], MD on 05/31/2020   05/30/2020 PET scan   1. Hypermetabolic lymph nodes identified in the left supraclavicular region/thoracic inlet, mediastinum, hilar regions, right retrocrural space, para-aortic retroperitoneum, and bilateral pelvic sidewall, compatible with metastatic disease. 2. Moderate volume ascites on this noncontrast CT study largely obscures peritoneal and omental disease although hypermetabolic plaque-like uptake in the anterior high abdomen is probably peritoneal with relatively bulky hypermetabolism in the omentum. These findings are also compatible with metastatic disease. 3. Hypermetabolic left upper lobe pulmonary nodule consistent with metastatic disease. Lung primary is considered less likely but not excluded. 4. Scattered areas of focal marrow uptake are suspicious for bony metastases although no underlying abnormality is evident on CT imaging. 5. Diffuse uptake in the uterine anatomy compatible with the patient's known history of endometrial carcinoma. 6. As  above, moderate volume ascites is new in the interval. 7. Enhancing lesion lower pole right kidney seen on previous CT of 03/28/2020 is less conspicuous on today's noncontrast CT imaging. Diffuse normal background FDG accumulation could obscure hypermetabolism in this lesion previously characterized as suspicious for renal cell carcinoma. 8. Bibasilar collapse/consolidation in the lower lobes.   06/01/2020 Procedure   Ultrasound and fluoroscopically guided right internal jugular single lumen power port catheter insertion. Tip in the SVC/RA junction. Catheter ready for use.   06/02/2020 Echocardiogram    1. GLS -18.2%.  2. Left ventricular ejection fraction, by estimation, is 60 to 65%. The left ventricle has normal function. The left ventricle has no regional wall motion abnormalities. There is mild left ventricular hypertrophy. Left ventricular diastolic parameters are consistent with Grade I diastolic dysfunction (impaired relaxation).  3. Right ventricular systolic function is normal. The right ventricular size is normal.  4. The mitral valve is normal in structure. No evidence of mitral valve regurgitation. No evidence of mitral stenosis.  5. The aortic valve is tricuspid. Aortic valve regurgitation is not visualized. No aortic stenosis is present.  6. The inferior vena cava is normal in size with greater than 50% respiratory variability, suggesting right atrial pressure of 3 mmHg.     06/03/2020 -  Chemotherapy    Patient is on Treatment Plan: UTERINE SEROUS CARCINOMA CARBOPLATIN + PACLITAXEL + TRASTUZUMAB Q21D X 6 CYCLES / TRASTUZUMAB Q21D      07/25/2020 Imaging   1. Interval response to therapy. There has been interval decrease in size of mediastinal, retrocrural, retroperitoneal, and left pelvic sidewall lymph nodes. 2. Decreased volume of ascites. Interval decrease in thickness of  omental cake secondary to peritoneal disease. 3. Decrease in size of FDG avid tumor within the  umbilicus. 4. Decrease in size of left upper lobe lung nodule. 5. Persistent left-sided hydronephrosis and hydroureter. No obstructing stone identified. 6. Previously noted solid enhancing lesion arising off the anterior cortex of the inferior pole of the right kidney is again noted and remains worrisome for renal cell carcinoma. 7. Aortic atherosclerosis.     08/08/2020 Echocardiogram    1. Left ventricular ejection fraction, by estimation, is 65 to 70%. The left ventricle has normal function. The left ventricle has no regional wall motion abnormalities. There is mild concentric left ventricular hypertrophy of the basal-septal segment. Left ventricular diastolic parameters are consistent with Grade I diastolic dysfunction (impaired relaxation). The average left ventricular global longitudinal strain is -18.5 %. The global longitudinal strain is normal.  2. Right ventricular systolic function is normal. The right ventricular size is normal.  3. The mitral valve is normal in structure. No evidence of mitral valve regurgitation.  4. The aortic valve is tricuspid. Aortic valve regurgitation is not visualized. No aortic stenosis is present.  5. The inferior vena cava is normal in size with greater than 50% respiratory variability, suggesting right atrial pressure of 3 mmHg.   Comparison(s): A prior study was performed on 06/02/2020. No significant change from prior study. Prior images reviewed side by side. Similar LV deformation and strain and improved loading conditions (improved blood pressure).   Metastasis to lymph nodes ([place])  05/31/2020 Initial Diagnosis   Metastasis to lymph nodes ([place])   06/03/2020 -  Chemotherapy    Patient is on Treatment Plan: UTERINE SEROUS CARCINOMA CARBOPLATIN + PACLITAXEL + TRASTUZUMAB Q21D X 6 CYCLES / TRASTUZUMAB Q21D      Metastasis to lung ([place])  05/31/2020 Initial Diagnosis   Metastasis to lung ([employer])   06/03/2020 -  Chemotherapy    Patient is on Treatment  Plan: UTERINE SEROUS CARCINOMA CARBOPLATIN + PACLITAXEL + TRASTUZUMAB Q21D X 6 CYCLES / TRASTUZUMAB Q21D        REVIEW OF SYSTEMS:   Constitutional: Denies fevers, chills  Eyes: Denies blurriness of vision Ears, nose, mouth, throat, and face: Denies mucositis or sore throat Respiratory: Denies cough, dyspnea or wheezes Cardiovascular: Denies palpitation, chest discomfort or lower extremity swelling Gastrointestinal:  Denies nausea, heartburn or change in bowel habits Skin: Denies abnormal skin rashes Lymphatics: Denies new lymphadenopathy or easy bruising Neurological:Denies numbness, tingling or new weaknesses Behavioral/Psych: Mood is stable, no new changes  All other systems were reviewed with the patient and are negative.  I have reviewed the past medical history, past surgical history, social history and family history with the patient and they are unchanged from previous note.  ALLERGIES:  has No Known Allergies.  MEDICATIONS:  Current Outpatient Medications  Medication Sig Dispense Refill  . Magnesium Oxide 400 MG CAPS Take 1 capsule (400 mg total) by mouth in the morning and at bedtime. 60 capsule 3  . amLODipine (NORVASC) 10 MG tablet TAKE 1 TABLET(10 MG) BY MOUTH DAILY 30 tablet 1  . dexamethasone (DECADRON) 4 MG tablet Take 2 tabs at the night before and 2 tab the morning of chemotherapy, every 3 weeks, by mouth x 6 cycles 36 tablet 6  . lactulose (CHRONULAC) 10 GM/15ML solution TAKE 30 ML BY MOUTH THREE TIMES DAILY 946 mL 1  . lidocaine-prilocaine (EMLA) cream Apply to affected area once 30 g 3  . losartan (COZAAR) 100 MG tablet Take 1 tablet (100 mg  total) by mouth daily. 90 tablet 1  . methocarbamol (ROBAXIN) 500 MG tablet Take 1 tablet (500 mg total) by mouth every 6 (six) hours as needed for muscle spasms. 40 tablet 0  . ondansetron (ZOFRAN) 8 MG tablet Take 1 tablet (8 mg total) by mouth every 8 (eight) hours as needed for refractory nausea / vomiting. Start on day 3  after carboplatin chemo. 30 tablet 1  . oxyCODONE (OXY IR/ROXICODONE) 5 MG immediate release tablet Take 1 tablet (5 mg total) by mouth every 4 (four) hours as needed for severe pain. 60 tablet 0  . polyethylene glycol (MIRALAX / GLYCOLAX) 17 g packet Take 17 g by mouth daily.    . potassium chloride SA (KLOR-CON) 20 MEQ tablet Take 1 tablet (20 mEq total) by mouth daily. 30 tablet 1  . prochlorperazine (COMPAZINE) 10 MG tablet TAKE 1 TABLET(10 MG) BY MOUTH EVERY 6 HOURS AS NEEDED FOR NAUSEA OR VOMITING 90 tablet 1   No current facility-administered medications for this visit.   Facility-Administered Medications Ordered in Other Visits  Medication Dose Route Frequency Provider Last Rate Last Admin  . 0.9 %  sodium chloride infusion   Intravenous Once Alvy Bimler, Loralei Radcliffe, MD      . acetaminophen (TYLENOL) tablet 650 mg  650 mg Oral Once Alvy Bimler, Karmen Altamirano, MD      . CARBOplatin (PARAPLATIN) 530 mg in sodium chloride 0.9 % 250 mL chemo infusion  530 mg Intravenous Once Alvy Bimler, Zamari Bonsall, MD      . dexamethasone (DECADRON) 10 mg in sodium chloride 0.9 % 50 mL IVPB  10 mg Intravenous Once Alvy Bimler, Joni Colegrove, MD      . diphenhydrAMINE (BENADRYL) injection 25 mg  25 mg Intravenous Once Alvy Bimler, Kalli Greenfield, MD      . famotidine (PEPCID) IVPB 20 mg premix  20 mg Intravenous Once Alvy Bimler, Jordain Radin, MD      . fosaprepitant (EMEND) 150 mg in sodium chloride 0.9 % 145 mL IVPB  150 mg Intravenous Once Alvy Bimler, Veleta Yamamoto, MD      . PACLitaxel (TAXOL) 210 mg in sodium chloride 0.9 % 250 mL chemo infusion (> 61m/m2)  105 mg/m2 (Treatment Plan Recorded) Intravenous Once GAlvy Bimler Johncharles Fusselman, MD      . palonosetron (ALOXI) injection 0.25 mg  0.25 mg Intravenous Once GAlvy Bimler Shakeem Stern, MD      . tTheotis Burrow(KANJINTI) 567 mg in sodium chloride 0.9 % 250 mL chemo infusion  6 mg/kg (Treatment Plan Recorded) Intravenous Once GAlvy Bimler Joan Herschberger, MD        PHYSICAL EXAMINATION: ECOG PERFORMANCE STATUS: 2 - Symptomatic, <50% confined to bed  Vitals:   09/01/20 0851  BP: (!)  167/80  Pulse: (!) 114  Resp: 20  Temp: (!) 96.7 F (35.9 C)  SpO2: 100%   Filed Weights   09/01/20 0851  Weight: 207 lb 12.8 oz (94.3 kg)    GENERAL:alert, no distress and comfortable SKIN: skin color, texture, turgor are normal, no rashes or significant lesions EYES: normal, Conjunctiva are pink and non-injected, sclera clear OROPHARYNX:no exudate, no erythema and lips, buccal mucosa, and tongue normal  NECK: supple, thyroid normal size, non-tender, without nodularity LYMPH:  no palpable lymphadenopathy in the cervical, axillary or inguinal LUNGS: clear to auscultation and percussion with normal breathing effort HEART: regular rate & rhythm and no murmurs with mild lower extremity edema ABDOMEN:abdomen soft, non-tender and normal bowel sounds Musculoskeletal:no cyanosis of digits and no clubbing  NEURO: alert & oriented x 3 with fluent speech, no focal motor/sensory deficits  LABORATORY DATA:  I have reviewed the data as listed    Component Value Date/Time   NA 141 09/01/2020 0951   NA 138 02/23/2020 1113   K 3.3 (L) 09/01/2020 0951   CL 102 09/01/2020 0951   CO2 26 09/01/2020 0951   GLUCOSE 177 (H) 09/01/2020 0951   BUN 13 09/01/2020 0951   BUN 15 02/23/2020 1113   CREATININE 0.91 09/01/2020 0951   CALCIUM 9.5 09/01/2020 0951   PROT 7.1 09/01/2020 0951   PROT 7.8 02/23/2020 1113   ALBUMIN 3.4 (L) 09/01/2020 0951   ALBUMIN 4.4 02/23/2020 1113   AST 11 (L) 09/01/2020 0951   ALT 8 09/01/2020 0951   ALKPHOS 87 09/01/2020 0951   BILITOT 0.5 09/01/2020 0951   GFRNONAA >60 09/01/2020 0951   GFRAA 91 02/23/2020 1113    No results found for: SPEP, UPEP  Lab Results  Component Value Date   WBC 6.3 09/01/2020   NEUTROABS 5.6 09/01/2020   HGB 8.3 (L) 09/01/2020   HCT 26.0 (L) 09/01/2020   MCV 77.6 (L) 09/01/2020   PLT 136 (L) 09/01/2020      Chemistry      Component Value Date/Time   NA 141 09/01/2020 0951   NA 138 02/23/2020 1113   K 3.3 (L) 09/01/2020 0951    CL 102 09/01/2020 0951   CO2 26 09/01/2020 0951   BUN 13 09/01/2020 0951   BUN 15 02/23/2020 1113   CREATININE 0.91 09/01/2020 0951      Component Value Date/Time   CALCIUM 9.5 09/01/2020 0951   ALKPHOS 87 09/01/2020 0951   AST 11 (L) 09/01/2020 0951   ALT 8 09/01/2020 0951   BILITOT 0.5 09/01/2020 0951       RADIOGRAPHIC STUDIES: I have personally reviewed the radiological images as listed and agreed with the findings in the report. ECHOCARDIOGRAM COMPLETE  Result Date: 08/08/2020    ECHOCARDIOGRAM REPORT   Patient Name:   Amanda Lin Date of Exam: 08/08/2020 Medical Rec #:  [id number]          Height:       61.0 in Accession #:    [id number]         Weight:       211.5 lb Date of Birth:  [birth year]          BSA:          1.934 m Patient Age:    34 years           BP:           168/79 mmHg Patient Gender: F                  HR:           75 bpm. Exam Location:  Outpatient Procedure: 2D Echo, Cardiac Doppler, Color Doppler and Strain Analysis Indications:    Chemo Z09  History:        Patient has prior history of Echocardiogram examinations, most                 recent 06/02/2020. Risk Factors:Hypertension and Diabetes.  Sonographer:    Darlina Sicilian RDCS Referring Phys: [id number] Ardie Mclennan [place]  1. Left ventricular ejection fraction, by estimation, is 65 to 70%. The left ventricle has normal function. The left ventricle has no regional wall motion abnormalities. There is mild concentric left ventricular hypertrophy of the basal-septal segment. Left ventricular diastolic parameters are consistent with Grade I diastolic dysfunction (impaired  relaxation). The average left ventricular global longitudinal strain is -18.5 %. The global longitudinal strain is normal.  2. Right ventricular systolic function is normal. The right ventricular size is normal.  3. The mitral valve is normal in structure. No evidence of mitral valve regurgitation.  4. The aortic valve is tricuspid. Aortic valve  regurgitation is not visualized. No aortic stenosis is present.  5. The inferior vena cava is normal in size with greater than 50% respiratory variability, suggesting right atrial pressure of 3 mmHg. Comparison(s): A prior study was performed on 06/02/2020. No significant change from prior study. Prior images reviewed side by side. Similar LV deformation and strain and improved loading conditions (improved blood pressure). FINDINGS  Left Ventricle: Left ventricular ejection fraction, by estimation, is 65 to 70%. The left ventricle has normal function. The left ventricle has no regional wall motion abnormalities. The average left ventricular global longitudinal strain is -18.5 %. The global longitudinal strain is normal. The left ventricular internal cavity size was normal in size. There is mild concentric left ventricular hypertrophy of the basal-septal segment. Left ventricular diastolic parameters are consistent with Grade I diastolic dysfunction (impaired relaxation). Right Ventricle: The right ventricular size is normal. No increase in right ventricular wall thickness. Right ventricular systolic function is normal. Left Atrium: Left atrial size was normal in size. Right Atrium: Right atrial size was normal in size. Pericardium: There is no evidence of pericardial effusion. Mitral Valve: The mitral valve is normal in structure. No evidence of mitral valve regurgitation. Tricuspid Valve: The tricuspid valve is normal in structure. Tricuspid valve regurgitation is not demonstrated. No evidence of tricuspid stenosis. Aortic Valve: The aortic valve is tricuspid. Aortic valve regurgitation is not visualized. No aortic stenosis is present. Pulmonic Valve: The pulmonic valve was grossly normal. Pulmonic valve regurgitation is not visualized. No evidence of pulmonic stenosis. Aorta: The aortic root and ascending aorta are structurally normal, with no evidence of dilitation. Venous: The inferior vena cava is normal in size  with greater than 50% respiratory variability, suggesting right atrial pressure of 3 mmHg. IAS/Shunts: The atrial septum is grossly normal.  LEFT VENTRICLE PLAX 2D LVIDd:         3.70 cm  Diastology LVIDs:         2.20 cm  LV e' medial:    5.77 cm/s LV PW:         1.00 cm  LV E/e' medial:  12.1 LV IVS:        1.50 cm  LV e' lateral:   5.00 cm/s LVOT diam:     1.90 cm  LV E/e' lateral: 14.0 LV SV:         61 LV SV Index:   32       2D Longitudinal Strain LVOT Area:     2.84 cm 2D Strain GLS (A2C):   -17.5 %                         2D Strain GLS (A3C):   -19.4 %                         2D Strain GLS Avg:     -18.5 % RIGHT VENTRICLE RV S prime:     13.80 cm/s TAPSE (M-mode): 2.2 cm LEFT ATRIUM             Index       RIGHT ATRIUM  Index LA diam:        3.20 cm 1.65 cm/m  RA Area:     10.50 cm LA Vol (A2C):   33.8 ml 17.48 ml/m RA Volume:   22.30 ml  11.53 ml/m LA Vol (A4C):   23.1 ml 11.94 ml/m LA Biplane Vol: 29.8 ml 15.41 ml/m  AORTIC VALVE LVOT Vmax:   119.00 cm/s LVOT Vmean:  84.100 cm/s LVOT VTI:    0.216 m  AORTA Ao Root diam: 2.80 cm Ao Asc diam:  2.10 cm MITRAL VALVE MV Area (PHT): 3.37 cm    SHUNTS MV Decel Time: 225 msec    Systemic VTI:  0.22 m MV E velocity: 69.80 cm/s  Systemic Diam: 1.90 cm MV A velocity: 83.60 cm/s MV E/A ratio:  0.83 Rudean Haskell MD Electronically signed by Rudean Haskell MD Signature Date/Time: 08/08/2020/3:20:40 PM    Final

## 2020-09-01 NOTE — Assessment & Plan Note (Signed)
She has profound pancytopenia due to side effects of chemotherapy As above, I plan to reduce the dose of carboplatin a little bit She has received multiple doses of IV iron recently She does not need transfusion support

## 2020-09-01 NOTE — Assessment & Plan Note (Signed)
Her blood pressure is mildly elevated but she is not symptomatic Observe closely for now

## 2020-09-01 NOTE — Assessment & Plan Note (Signed)
Her potassium is much improved She does not need IV potassium replacement Continue to encourage good oral intake

## 2020-09-10 ENCOUNTER — Other Ambulatory Visit: Payer: Self-pay | Admitting: Hematology and Oncology

## 2020-09-14 ENCOUNTER — Telehealth: Payer: Self-pay

## 2020-09-14 NOTE — Telephone Encounter (Signed)
-----   Message from [name], MD sent at 09/14/2020 10:39 AM EDT ----- Regarding: RE: Pt symptoms: stuffy nose I suggest over the counter antihistamines first, if not better, call back Friday morning and we can see her then ----- Message ----- From: Veverly Fells, RN Sent: 09/14/2020  10:36 AM EDT To: [name], MD Subject: Pt symptoms: stuffy nose                       VM received from pt stating that she has had a "stuffy and sore nose all week". Wants recommendation from Dr. Alvy Bimler.  Please advise.

## 2020-09-14 NOTE — Telephone Encounter (Signed)
Spoke with pt regarding MD Gorsuch's message below. Pt verbalizes understanding and agrees with plan of care. Pt states she will call Friday (09/16/20) AM if symptoms do not improve.

## 2020-09-15 ENCOUNTER — Ambulatory Visit: Payer: 59

## 2020-09-15 ENCOUNTER — Ambulatory Visit: Payer: 59 | Admitting: Hematology and Oncology

## 2020-09-15 ENCOUNTER — Other Ambulatory Visit: Payer: 59

## 2020-09-22 ENCOUNTER — Inpatient Hospital Stay: Payer: 59 | Attending: Gynecologic Oncology

## 2020-09-22 ENCOUNTER — Encounter: Payer: Self-pay | Admitting: Hematology and Oncology

## 2020-09-22 ENCOUNTER — Telehealth: Payer: Self-pay

## 2020-09-22 ENCOUNTER — Telehealth: Payer: Self-pay | Admitting: Hematology and Oncology

## 2020-09-22 ENCOUNTER — Other Ambulatory Visit: Payer: 59

## 2020-09-22 ENCOUNTER — Inpatient Hospital Stay: Payer: 59 | Admitting: Hematology and Oncology

## 2020-09-22 ENCOUNTER — Inpatient Hospital Stay: Payer: 59

## 2020-09-22 ENCOUNTER — Inpatient Hospital Stay: Payer: 59 | Admitting: Nutrition

## 2020-09-22 ENCOUNTER — Other Ambulatory Visit: Payer: Self-pay

## 2020-09-22 DIAGNOSIS — R634 Abnormal weight loss: Secondary | ICD-10-CM | POA: Diagnosis not present

## 2020-09-22 DIAGNOSIS — D61810 Antineoplastic chemotherapy induced pancytopenia: Secondary | ICD-10-CM | POA: Diagnosis not present

## 2020-09-22 DIAGNOSIS — R195 Other fecal abnormalities: Secondary | ICD-10-CM

## 2020-09-22 DIAGNOSIS — D61818 Other pancytopenia: Secondary | ICD-10-CM | POA: Diagnosis not present

## 2020-09-22 DIAGNOSIS — I10 Essential (primary) hypertension: Secondary | ICD-10-CM | POA: Insufficient documentation

## 2020-09-22 DIAGNOSIS — C778 Secondary and unspecified malignant neoplasm of lymph nodes of multiple regions: Secondary | ICD-10-CM

## 2020-09-22 DIAGNOSIS — C7800 Secondary malignant neoplasm of unspecified lung: Secondary | ICD-10-CM | POA: Insufficient documentation

## 2020-09-22 DIAGNOSIS — F419 Anxiety disorder, unspecified: Secondary | ICD-10-CM | POA: Diagnosis not present

## 2020-09-22 DIAGNOSIS — E876 Hypokalemia: Secondary | ICD-10-CM

## 2020-09-22 DIAGNOSIS — C541 Malignant neoplasm of endometrium: Secondary | ICD-10-CM

## 2020-09-22 DIAGNOSIS — G652 Sequelae of toxic polyneuropathy: Secondary | ICD-10-CM | POA: Diagnosis not present

## 2020-09-22 DIAGNOSIS — Z5111 Encounter for antineoplastic chemotherapy: Secondary | ICD-10-CM | POA: Diagnosis present

## 2020-09-22 DIAGNOSIS — R531 Weakness: Secondary | ICD-10-CM

## 2020-09-22 DIAGNOSIS — T451X5A Adverse effect of antineoplastic and immunosuppressive drugs, initial encounter: Secondary | ICD-10-CM | POA: Insufficient documentation

## 2020-09-22 DIAGNOSIS — N19 Unspecified kidney failure: Secondary | ICD-10-CM | POA: Diagnosis not present

## 2020-09-22 DIAGNOSIS — G620 Drug-induced polyneuropathy: Secondary | ICD-10-CM

## 2020-09-22 DIAGNOSIS — Z79899 Other long term (current) drug therapy: Secondary | ICD-10-CM | POA: Diagnosis not present

## 2020-09-22 LAB — CBC WITH DIFFERENTIAL (CANCER CENTER ONLY)
Abs Immature Granulocytes: 0.05 10*3/uL (ref 0.00–0.07)
Basophils Absolute: 0 10*3/uL (ref 0.0–0.1)
Basophils Relative: 0 %
Eosinophils Absolute: 0 10*3/uL (ref 0.0–0.5)
Eosinophils Relative: 0 %
HCT: 24.7 % — ABNORMAL LOW (ref 36.0–46.0)
Hemoglobin: 8.1 g/dL — ABNORMAL LOW (ref 12.0–15.0)
Immature Granulocytes: 1 %
Lymphocytes Relative: 16 %
Lymphs Abs: 0.9 10*3/uL (ref 0.7–4.0)
MCH: 27.5 pg (ref 26.0–34.0)
MCHC: 32.8 g/dL (ref 30.0–36.0)
MCV: 83.7 fL (ref 80.0–100.0)
Monocytes Absolute: 0.7 10*3/uL (ref 0.1–1.0)
Monocytes Relative: 12 %
Neutro Abs: 4.4 10*3/uL (ref 1.7–7.7)
Neutrophils Relative %: 71 %
Platelet Count: 280 10*3/uL (ref 150–400)
RBC: 2.95 MIL/uL — ABNORMAL LOW (ref 3.87–5.11)
RDW: 23.4 % — ABNORMAL HIGH (ref 11.5–15.5)
WBC Count: 6.1 10*3/uL (ref 4.0–10.5)
nRBC: 0 % (ref 0.0–0.2)

## 2020-09-22 LAB — CMP (CANCER CENTER ONLY)
ALT: <6 U/L (ref 0–44)
AST: 12 U/L — ABNORMAL LOW (ref 15–41)
Albumin: 3.4 g/dL — ABNORMAL LOW (ref 3.5–5.0)
Alkaline Phosphatase: 83 U/L (ref 38–126)
Anion gap: 16 — ABNORMAL HIGH (ref 5–15)
BUN: 13 mg/dL (ref 8–23)
CO2: 32 mmol/L (ref 22–32)
Calcium: 9.6 mg/dL (ref 8.9–10.3)
Chloride: 92 mmol/L — ABNORMAL LOW (ref 98–111)
Creatinine: 1.49 mg/dL — ABNORMAL HIGH (ref 0.44–1.00)
GFR, Estimated: 39 mL/min — ABNORMAL LOW (ref >60)
Glucose, Bld: 141 mg/dL — ABNORMAL HIGH (ref 70–99)
Potassium: 2.8 mmol/L — ABNORMAL LOW (ref 3.5–5.1)
Sodium: 140 mmol/L (ref 135–145)
Total Bilirubin: 0.4 mg/dL (ref 0.3–1.2)
Total Protein: 7.4 g/dL (ref 6.5–8.1)

## 2020-09-22 LAB — MAGNESIUM: Magnesium: 1.2 mg/dL — ABNORMAL LOW (ref 1.7–2.4)

## 2020-09-22 MED ORDER — ACETAMINOPHEN 325 MG PO TABS
ORAL_TABLET | ORAL | Status: AC
  Filled 2020-09-22: qty 2

## 2020-09-22 MED ORDER — DEXAMETHASONE SODIUM PHOSPHATE 100 MG/10ML IJ SOLN
10.0000 mg | Freq: Once | INTRAMUSCULAR | Status: AC
  Administered 2020-09-22: 10 mg via INTRAVENOUS
  Filled 2020-09-22: qty 10

## 2020-09-22 MED ORDER — SODIUM CHLORIDE 0.9% FLUSH
10.0000 mL | INTRAVENOUS | Status: DC | PRN
  Administered 2020-09-22: 10 mL
  Filled 2020-09-22: qty 10

## 2020-09-22 MED ORDER — DIPHENHYDRAMINE HCL 50 MG/ML IJ SOLN
INTRAMUSCULAR | Status: AC
  Filled 2020-09-22: qty 1

## 2020-09-22 MED ORDER — HEPARIN SOD (PORK) LOCK FLUSH 100 UNIT/ML IV SOLN
500.0000 [IU] | Freq: Once | INTRAVENOUS | Status: AC | PRN
Start: 2020-09-22 — End: 2020-09-22
  Administered 2020-09-22: 500 [IU]
  Filled 2020-09-22: qty 5

## 2020-09-22 MED ORDER — PALONOSETRON HCL INJECTION 0.25 MG/5ML
INTRAVENOUS | Status: AC
  Filled 2020-09-22: qty 5

## 2020-09-22 MED ORDER — FAMOTIDINE 20 MG IN NS 100 ML IVPB
INTRAVENOUS | Status: AC
  Filled 2020-09-22: qty 100

## 2020-09-22 MED ORDER — FOSAPREPITANT DIMEGLUMINE INJECTION 150 MG
150.0000 mg | Freq: Once | INTRAVENOUS | Status: AC
  Administered 2020-09-22: 150 mg via INTRAVENOUS
  Filled 2020-09-22: qty 150

## 2020-09-22 MED ORDER — ACETAMINOPHEN 325 MG PO TABS
650.0000 mg | ORAL_TABLET | Freq: Once | ORAL | Status: AC
  Administered 2020-09-22: 650 mg via ORAL

## 2020-09-22 MED ORDER — SODIUM CHLORIDE 0.9% FLUSH
10.0000 mL | Freq: Once | INTRAVENOUS | Status: AC
Start: 2020-09-22 — End: 2020-09-22
  Administered 2020-09-22: 10 mL
  Filled 2020-09-22: qty 10

## 2020-09-22 MED ORDER — TRASTUZUMAB-ANNS CHEMO 150 MG IV SOLR
6.0000 mg/kg | Freq: Once | INTRAVENOUS | Status: AC
  Administered 2020-09-22: 525 mg via INTRAVENOUS
  Filled 2020-09-22: qty 25

## 2020-09-22 MED ORDER — PALONOSETRON HCL INJECTION 0.25 MG/5ML
0.2500 mg | Freq: Once | INTRAVENOUS | Status: AC
  Administered 2020-09-22: 0.25 mg via INTRAVENOUS

## 2020-09-22 MED ORDER — MAGNESIUM SULFATE 2 GM/50ML IV SOLN
INTRAVENOUS | Status: AC
  Filled 2020-09-22: qty 50

## 2020-09-22 MED ORDER — DIPHENHYDRAMINE HCL 50 MG/ML IJ SOLN
25.0000 mg | Freq: Once | INTRAMUSCULAR | Status: AC
Start: 2020-09-22 — End: 2020-09-22
  Administered 2020-09-22: 25 mg via INTRAVENOUS

## 2020-09-22 MED ORDER — MAGNESIUM SULFATE 2 GM/50ML IV SOLN
2.0000 g | Freq: Once | INTRAVENOUS | Status: AC
Start: 2020-09-22 — End: 2020-09-22
  Administered 2020-09-22: 2 g via INTRAVENOUS

## 2020-09-22 MED ORDER — SODIUM CHLORIDE 0.9 % IV SOLN
87.5000 mg/m2 | Freq: Once | INTRAVENOUS | Status: AC
  Administered 2020-09-22: 168 mg via INTRAVENOUS
  Filled 2020-09-22: qty 28

## 2020-09-22 MED ORDER — FAMOTIDINE 20 MG IN NS 100 ML IVPB
20.0000 mg | Freq: Once | INTRAVENOUS | Status: AC
Start: 2020-09-22 — End: 2020-09-22
  Administered 2020-09-22: 20 mg via INTRAVENOUS

## 2020-09-22 MED ORDER — SODIUM CHLORIDE 0.9 % IV SOLN
310.8000 mg | Freq: Once | INTRAVENOUS | Status: AC
  Administered 2020-09-22: 310 mg via INTRAVENOUS
  Filled 2020-09-22: qty 31

## 2020-09-22 MED ORDER — SODIUM CHLORIDE 0.9 % IV SOLN
Freq: Once | INTRAVENOUS | Status: AC
  Filled 2020-09-22: qty 250

## 2020-09-22 NOTE — Assessment & Plan Note (Signed)
She has profound pancytopenia due to side effects of chemotherapy As above, I plan to reduce the dose of carboplatin a little bit She has received multiple doses of IV iron recently She does not need transfusion support 

## 2020-09-22 NOTE — Progress Notes (Signed)
Nutrition follow-up completed with patient during infusion for metastatic endometrial cancer. Weight decreased to 193 pounds on May 19 decreased from 214.6 pounds March 23.  This is a 10% weight loss in less than 2 months which is significant. Patient is very drowsy and has difficulty staying awake for questions. She has had some nausea and vomiting. She has been drinking Ensure clear but does not have any at this time and states she needs to go get some. Denies issues with her bowels at this time.  Nutrition diagnosis: Unintended weight loss continues.  Intervention: Encourage patient to try to increase oral intake in small amounts throughout the day. Provided clear liquid oral nutrition supplement samples and encourage patient to include these daily. Encouraged antiemetics for nausea and vomiting. Encouraged bowel regimen and adequate fluids.  Monitoring, evaluation, goals: Patient will tolerate increased calories and protein to minimize weight loss.  Next visit: Thursday, June 16 during infusion.  **Disclaimer: This note was dictated with voice recognition software. Similar sounding words can inadvertently be transcribed and this note may contain transcription errors which may not have been corrected upon publication of note.**

## 2020-09-22 NOTE — Progress Notes (Signed)
Per Dr. Alvy Bimler, okay to treat with elevated HR.

## 2020-09-22 NOTE — Assessment & Plan Note (Signed)
Her blood pressure is elevated today but attributed to anxiety She is tearful I try to calm her down in the office and reassure her

## 2020-09-22 NOTE — Patient Instructions (Addendum)
[facility] MEDICAL [facility]COLOG[facility]  Discharge Instructions: Thank you for choosing [facility] to provide your oncology and hematology care.   If you have a lab appointment with the Cancer Center, please go directly to the Cancer Center and check in at the registration area.   Wear comfortable clothing and clothing appropriate for easy access to any Portacath or PICC line.   We strive to give you quality time with your provider. You may need to reschedule your appointment if you arrive late (15 or more minutes).  Arriving late affects you and other patients whose appointments are after yours.  Also, if you miss three or more appointments without notifying the office, you may be dismissed from the clinic at the provider's discretion.      For prescription refill requests, have your pharmacy contact our office and allow 72 hours for refills to be completed.    Today you received the following chemotherapy and/or immunotherapy agents: Kanjinti/Taxol/Carbo.      To help prevent nausea and vomiting after your treatment, we encourage you to take your nausea medication as directed.  BELOW ARE SYMPTOMS THAT SHOULD BE REPORTED IMMEDIATELY: *FEVER GREATER THAN 100.4 F (38 C) OR HIGHER *CHILLS OR SWEATING *NAUSEA AND VOMITING THAT IS NOT CONTROLLED WITH YOUR NAUSEA MEDICATION *UNUSUAL SHORTNESS OF BREATH *UNUSUAL BRUISING OR BLEEDING *URINARY PROBLEMS (pain or burning when urinating, or frequent urination) *BOWEL PROBLEMS (unusual diarrhea, constipation, pain near the anus) TENDERNESS IN MOUTH AND THROAT WITH OR WITHOUT PRESENCE OF ULCERS (sore throat, sores in mouth, or a toothache) UNUSUAL RASH, SWELLING OR PAIN  UNUSUAL VAGINAL DISCHARGE OR ITCHING   Items with * indicate a potential emergency and should be followed up as soon as possible or go to the Emergency Department if any problems should occur.  Please show the CHEMOTHERAPY ALERT CARD or IMMUNOTHERAPY ALERT CARD at  check-in to the Emergency Department and triage nurse.  Should you have questions after your visit or need to cancel or reschedule your appointment, please contact [facility]  Dept: [phone]  and follow the prompts.  Office hours are 8:00 a.m. to 4:30 p.m. Monday - Friday. Please note that voicemails left after 4:00 p.m. may not be returned until the following business day.  We are closed weekends and major holidays. You have access to a nurse at all times for urgent questions. Please call the main number to the clinic Dept: [phone] and follow the prompts.   For any non-urgent questions, you may also contact your provider using MyChart. We now offer e-Visits for anyone 18 and older to request care online for non-urgent symptoms. For details visit [email or web address].   Also download the MyChart app! Go to the app store, search "MyChart", open the app, select [facility], and log in with your MyChart username and password.  Due to Covid, a mask is required upon entering the hospital/clinic. If you do not have a mask, one will be given to you upon arrival. For doctor visits, patients may have 1 support person aged 18 or older with them. For treatment visits, patients cannot have anyone with them due to current Covid guidelines and our immunocompromised population.   Hypomagnesemia Hypomagnesemia is a condition in which the level of magnesium in the blood is low. Magnesium is a mineral that is found in many foods. It is used in many different processes in the body. Hypomagnesemia can affect every organ in the body. In severe cases, it can   cause life-threatening problems. What are the causes? This condition may be caused by: Not getting enough magnesium in your diet. Malnutrition. Problems with absorbing magnesium from the intestines. Dehydration. Alcohol abuse. Vomiting. Severe or chronic diarrhea. Some medicines, including medicines that make you  urinate more (diuretics). Certain diseases, such as kidney disease, diabetes, celiac disease, and overactive thyroid. What are the signs or symptoms? Symptoms of this condition include: Loss of appetite. Nausea and vomiting. Involuntary shaking or trembling of a body part (tremor). Muscle weakness. Tingling in the arms and legs. Sudden tightening of muscles (muscle spasms). Confusion. Psychiatric issues, such as depression, irritability, or psychosis. A feeling of fluttering of the heart. Seizures. These symptoms are more severe if magnesium levels drop suddenly. How is this diagnosed? This condition may be diagnosed based on: Your symptoms and medical history. A physical exam. Blood and urine tests. How is this treated? Treatment depends on the cause and the severity of the condition. It may be treated with: A magnesium supplement. This can be taken in pill form. If the condition is severe, magnesium is usually given through an IV. Changes to your diet. You may be directed to eat foods that have a lot of magnesium, such as green leafy vegetables, peas, beans, and nuts. Stopping any intake of alcohol.   Follow these instructions at home: Make sure that your diet includes foods with magnesium. Foods that have a lot of magnesium in them include: Green leafy vegetables, such as spinach and broccoli. Beans and peas. Nuts and seeds, such as almonds and sunflower seeds. Whole grains, such as whole grain bread and fortified cereals. Take magnesium supplements if your health care provider tells you to do that. Take them as directed. Take over-the-counter and prescription medicines only as told by your health care provider. Have your magnesium levels monitored as told by your health care provider. When you are active, drink fluids that contain electrolytes. Avoid drinking alcohol. Keep all follow-up visits as told by your health care provider. This is important.      Contact a health  care provider if: You get worse instead of better. Your symptoms return. Get help right away if you: Develop severe muscle weakness. Have trouble breathing. Feel that your heart is racing. Summary Hypomagnesemia is a condition in which the level of magnesium in the blood is low. Hypomagnesemia can affect every organ in the body. Treatment may include eating more foods that contain magnesium, taking magnesium supplements, and not drinking alcohol. Have your magnesium levels monitored as told by your health care provider. This information is not intended to replace advice given to you by your health care provider. Make sure you discuss any questions you have with your health care provider. Document Revised: 09/24/2019 Document Reviewed: 09/24/2019 Elsevier Patient Education  2021 Elsevier Inc.  

## 2020-09-22 NOTE — Assessment & Plan Note (Signed)
I have discontinued her lactulose

## 2020-09-22 NOTE — Assessment & Plan Note (Addendum)
She is getting worsening neuropathy despite recent dose adjustment I plan to reduce the dose of taxol further

## 2020-09-22 NOTE — Assessment & Plan Note (Signed)
We will send referral to physical therapy

## 2020-09-22 NOTE — Assessment & Plan Note (Signed)
Her potassium is worse again I need to replace IV magnesium first I have discontinue her oral potassium supplement because it is difficult to swallow and it made her sick

## 2020-09-22 NOTE — Telephone Encounter (Signed)
Called and left a message for her daughter. IV fluids and IV magnesium scheduled for this Saturday at 9 am.  Ask her to call the office for any questions.

## 2020-09-22 NOTE — Telephone Encounter (Signed)
Scheduled appointment per 05/19 schedule message. Patient is aware.

## 2020-09-22 NOTE — Assessment & Plan Note (Signed)
She has persistent hypomagnesemia I will order IV magnesium replacement She will return again on Saturday for further IV magnesium replacement therapy

## 2020-09-22 NOTE — Progress Notes (Signed)
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[facility]) She has profound weight loss and slight renal failure due to poor oral intake I plan to reduce the dose of her chemotherapy further and adjusted based on her most recent serum creatinine The patient is also quite weak from side effects of treatment and I will refer her to see cancer rehab physical therapist I will delay her next treatment by 1 week to allow more recovery after today's treatment  Pancytopenia, acquired [facility]) She has profound pancytopenia due to side effects of chemotherapy As above, I plan to reduce the dose of carboplatin a little bit She has received multiple doses of IV iron recently She does not need transfusion support  Hypomagnesemia She has persistent hypomagnesemia I will order IV magnesium replacement She will return again on Saturday for further IV magnesium replacement therapy  Hypokalemia, inadequate intake Her potassium is worse again I need to replace IV magnesium first I have discontinue her oral potassium supplement because it is difficult to swallow and it made her sick  Essential hypertension Her blood pressure is elevated today but attributed to anxiety She is tearful I try to calm her down in the office and reassure her  Peripheral neuropathy due to chemotherapy [facility]) She is getting worsening neuropathy despite recent dose adjustment I plan to reduce the dose of taxol further  Loose stools I have discontinued her lactulose  Generalized weakness We will send referral to physical therapy   Orders Placed This Encounter  Procedures  . Ambulatory referral to Physical Therapy    Referral Priority:   Routine    Referral Type:   Physical Medicine    Referral Reason:   Specialty Services Required     Requested Specialty:   Physical Therapy    Number of Visits Requested:   1    All questions were answered. The patient knows to call the clinic with any problems, questions or concerns. The total time spent in the appointment was 40 minutes encounter with patients including review of chart and various tests results, discussions about plan of care and coordination of care plan   [name], MD 09/22/2020 11:25 AM  INTERVAL HISTORY: Please see below for problem oriented charting. 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     02/26/2020 Imaging   1. 3 mm nonobstructing renal stone within the left kidney. 2. Findings suggestive of a small hemorrhagic cyst within the lower pole of the right kidney, with an anterior right lower pole heterogeneous renal soft tissue mass. MRI correlation is recommended, as an underlying neoplastic process cannot be excluded. 3. Colonic diverticulosis. 4. Multiple exophytic uterine  fibroids. 5. Evidence of prior cholecystectomy.   03/28/2020 Imaging   2.4 cm enhancing mass in the anterior right lower kidney, suspicious for solid renal neoplasm such as renal cell carcinoma.   Single right renal artery and vein.  No renal vein invasion.   Small  retroperitoneal nodes, measuring up to 9 mm short axis, mildly prominent but technically within the upper limits of normal. Attention on follow-up is suggested.   4 x 5 mm nodule in the medial right lower lobe. This is nonspecific and unlikely to reflect a metastasis but warrants attention on follow-up. Consider dedicated CT chest in 3-6 months.   3 mm nonobstructing left lower pole renal calculus. No hydronephrosis.     04/04/2020 Imaging   1. 10 mm endometrial stripe thickness. This is considered abnormal in a postmenopausal female with bleeding. In the setting of post-menopausal bleeding, endometrial sampling is indicated to exclude carcinoma.  2. Multiple uterine fibroids including potential submucosal fibroid in the lower uterine segment.   04/19/2020 Pathology Results   A. ENDOMETRIUM, BIOPSY:  -  High-grade carcinoma  Immunohistochemical and morphometric analysis performed manually   The tumor cells are POSITIVE for Her2 (3+).    04/19/2020 Procedure   She was seen by Dr. Astrid Drafts on 04/19/2020.  At that visit her cervix was noted to be nodular and firm and abnormal appearing.  Pap taken at that visit was positive for adenocarcinoma, endometrial.  An endometrial Pipelle biopsy was taken at the same visit which revealed high-grade endometrial cancer, suspicious for serous carcinoma.     05/12/2020 Cancer Staging   Staging form: Corpus Uteri - Carcinoma and Carcinosarcoma, AJCC 8th Edition - Clinical stage from 05/12/2020: FIGO Stage IVB (cT3, cN2, cM1) - Signed by [name], MD on 05/31/2020   05/30/2020 PET scan   1. Hypermetabolic lymph nodes identified in the left supraclavicular region/thoracic inlet, mediastinum, hilar regions, right retrocrural space, para-aortic retroperitoneum, and bilateral pelvic sidewall, compatible with metastatic disease. 2. Moderate volume ascites on this noncontrast CT study largely obscures peritoneal and omental disease although hypermetabolic  plaque-like uptake in the anterior high abdomen is probably peritoneal with relatively bulky hypermetabolism in the omentum. These findings are also compatible with metastatic disease. 3. Hypermetabolic left upper lobe pulmonary nodule consistent with metastatic disease. Lung primary is considered less likely but not excluded. 4. Scattered areas of focal marrow uptake are suspicious for bony metastases although no underlying abnormality is evident on CT imaging. 5. Diffuse uptake in the uterine anatomy compatible with the patient's known history of endometrial carcinoma. 6. As above, moderate volume ascites is new in the interval. 7. Enhancing lesion lower pole right kidney seen on previous CT of 03/28/2020 is less conspicuous on today's noncontrast CT imaging. Diffuse normal background FDG accumulation could obscure hypermetabolism in this lesion previously characterized as suspicious for renal cell carcinoma. 8. Bibasilar collapse/consolidation in the lower lobes.   06/01/2020 Procedure   Ultrasound and fluoroscopically guided right internal jugular single lumen power port catheter insertion. Tip in the SVC/RA junction. Catheter ready for use.   06/02/2020 Echocardiogram    1. GLS -18.2%.  2. Left ventricular ejection fraction, by estimation, is 60 to 65%. The left ventricle has normal function. The left ventricle has no regional wall motion abnormalities. There is mild left ventricular hypertrophy. Left ventricular diastolic parameters are consistent with Grade I diastolic dysfunction (impaired relaxation).  3. Right ventricular systolic function is normal. The right ventricular size is normal.  4. The mitral valve is normal  in structure. No evidence of mitral valve regurgitation. No evidence of mitral stenosis.  5. The aortic valve is tricuspid. Aortic valve regurgitation is not visualized. No aortic stenosis is present.  6. The inferior vena cava is normal in size with greater than 50%  respiratory variability, suggesting right atrial pressure of 3 mmHg.     06/03/2020 -  Chemotherapy    Patient is on Treatment Plan: UTERINE SEROUS CARCINOMA CARBOPLATIN + PACLITAXEL + TRASTUZUMAB Q21D X 6 CYCLES / TRASTUZUMAB Q21D      07/25/2020 Imaging   1. Interval response to therapy. There has been interval decrease in size of mediastinal, retrocrural, retroperitoneal, and left pelvic sidewall lymph nodes. 2. Decreased volume of ascites. Interval decrease in thickness of omental cake secondary to peritoneal disease. 3. Decrease in size of FDG avid tumor within the umbilicus. 4. Decrease in size of left upper lobe lung nodule. 5. Persistent left-sided hydronephrosis and hydroureter. No obstructing stone identified. 6. Previously noted solid enhancing lesion arising off the anterior cortex of the inferior pole of the right kidney is again noted and remains worrisome for renal cell carcinoma. 7. Aortic atherosclerosis.     08/08/2020 Echocardiogram    1. Left ventricular ejection fraction, by estimation, is 65 to 70%. The left ventricle has normal function. The left ventricle has no regional wall motion abnormalities. There is mild concentric left ventricular hypertrophy of the basal-septal segment. Left ventricular diastolic parameters are consistent with Grade I diastolic dysfunction (impaired relaxation). The average left ventricular global longitudinal strain is -18.5 %. The global longitudinal strain is normal.  2. Right ventricular systolic function is normal. The right ventricular size is normal.  3. The mitral valve is normal in structure. No evidence of mitral valve regurgitation.  4. The aortic valve is tricuspid. Aortic valve regurgitation is not visualized. No aortic stenosis is present.  5. The inferior vena cava is normal in size with greater than 50% respiratory variability, suggesting right atrial pressure of 3 mmHg.   Comparison(s): A prior study was performed on 06/02/2020.  No significant change from prior study. Prior images reviewed side by side. Similar LV deformation and strain and improved loading conditions (improved blood pressure).   Metastasis to lymph nodes ([place])  05/31/2020 Initial Diagnosis   Metastasis to lymph nodes ([place])   06/03/2020 -  Chemotherapy    Patient is on Treatment Plan: UTERINE SEROUS CARCINOMA CARBOPLATIN + PACLITAXEL + TRASTUZUMAB Q21D X 6 CYCLES / TRASTUZUMAB Q21D      Metastasis to lung ([place])  05/31/2020 Initial Diagnosis   Metastasis to lung ([place])   06/03/2020 -  Chemotherapy    Patient is on Treatment Plan: UTERINE SEROUS CARCINOMA CARBOPLATIN + PACLITAXEL + TRASTUZUMAB Q21D X 6 CYCLES / TRASTUZUMAB Q21D        REVIEW OF SYSTEMS:   Constitutional: Denies fevers, chills  Eyes: Denies blurriness of vision Ears, nose, mouth, throat, and face: Denies mucositis or sore throat Respiratory: Denies cough, dyspnea or wheezes Cardiovascular: Denies palpitation, chest discomfort or lower extremity swelling Skin: Denies abnormal skin rashes Neurological:Denies numbness, tingling or new weaknesses Behavioral/Psych: Mood is stable, no new changes  All other systems were reviewed with the patient and are negative.  I have reviewed the past medical history, past surgical history, social history and family history with the patient and they are unchanged from previous note.  ALLERGIES:  has No Known Allergies.  MEDICATIONS:  Current Outpatient Medications  Medication Sig Dispense Refill  . amLODipine (NORVASC) 10 MG tablet  TAKE 1 TABLET(10 MG) BY MOUTH DAILY 30 tablet 1  . dexamethasone (DECADRON) 4 MG tablet Take 2 tabs at the night before and 2 tab the morning of chemotherapy, every 3 weeks, by mouth x 6 cycles 36 tablet 6  . lidocaine-prilocaine (EMLA) cream Apply to affected area once 30 g 3  . losartan (COZAAR) 100 MG tablet Take 1 tablet (100 mg total) by mouth daily. 90 tablet 1  . Magnesium Oxide 400 MG CAPS Take 1 capsule  (400 mg total) by mouth in the morning and at bedtime. 60 capsule 3  . methocarbamol (ROBAXIN) 500 MG tablet Take 1 tablet (500 mg total) by mouth every 6 (six) hours as needed for muscle spasms. 40 tablet 0  . ondansetron (ZOFRAN) 8 MG tablet Take 1 tablet (8 mg total) by mouth every 8 (eight) hours as needed for refractory nausea / vomiting. Start on day 3 after carboplatin chemo. 30 tablet 1  . oxyCODONE (OXY IR/ROXICODONE) 5 MG immediate release tablet Take 1 tablet (5 mg total) by mouth every 4 (four) hours as needed for severe pain. 60 tablet 0  . polyethylene glycol (MIRALAX / GLYCOLAX) 17 g packet Take 17 g by mouth daily.    . prochlorperazine (COMPAZINE) 10 MG tablet TAKE 1 TABLET(10 MG) BY MOUTH EVERY 6 HOURS AS NEEDED FOR NAUSEA OR VOMITING 90 tablet 1   No current facility-administered medications for this visit.   Facility-Administered Medications Ordered in Other Visits  Medication Dose Route Frequency Provider Last Rate Last Admin  . CARBOplatin (PARAPLATIN) 310 mg in sodium chloride 0.9 % 250 mL chemo infusion  310 mg Intravenous Once Alvy Bimler, Opaline Reyburn, MD      . fosaprepitant (EMEND) 150 mg in sodium chloride 0.9 % 145 mL IVPB  150 mg Intravenous Once Alvy Bimler, Takari Duncombe, MD 450 mL/hr at 09/22/20 1110 150 mg at 09/22/20 1110  . heparin lock flush 100 unit/mL  500 Units Intracatheter Once PRN Alvy Bimler, Chandi Nicklin, MD      . magnesium sulfate IVPB 2 g 50 mL  2 g Intravenous Once Alvy Bimler, Marilu Rylander, MD      . PACLitaxel (TAXOL) 168 mg in sodium chloride 0.9 % 250 mL chemo infusion (> 29m/m2)  87.5 mg/m2 (Treatment Plan Recorded) Intravenous Once Kamariah Fruchter, MD      . sodium chloride flush (NS) 0.9 % injection 10 mL  10 mL Intracatheter PRN GAlvy Bimler Monica Codd, MD      . tTheotis Burrow(KANJINTI) 525 mg in sodium chloride 0.9 % 250 mL chemo infusion  6 mg/kg (Treatment Plan Recorded) Intravenous Once GAlvy Bimler Wallis Spizzirri, MD        PHYSICAL EXAMINATION: ECOG PERFORMANCE STATUS: 2 - Symptomatic, <50% confined to  bed  Vitals:   09/22/20 0914  BP: (!) 171/89  Pulse: (!) 143  Resp: 18  Temp: (!) 97.4 F (36.3 C)  SpO2: 100%   Filed Weights   09/22/20 0914  Weight: 193 lb (87.5 kg)    GENERAL:alert, no distress and comfortable SKIN: skin color, texture, turgor are normal, no rashes or significant lesions EYES: normal, Conjunctiva are pink and non-injected, sclera clear OROPHARYNX:no exudate, no erythema and lips, buccal mucosa, and tongue normal  NECK: supple, thyroid normal size, non-tender, without nodularity LYMPH:  no palpable lymphadenopathy in the cervical, axillary or inguinal LUNGS: clear to auscultation and percussion with normal breathing effort HEART: regular rate & rhythm and no murmurs and no lower extremity edema ABDOMEN:abdomen soft, non-tender and normal bowel sounds Musculoskeletal:no cyanosis of digits and no  clubbing  NEURO: alert & oriented x 3 with fluent speech, no focal motor/sensory deficits  LABORATORY DATA:  I have reviewed the data as listed    Component Value Date/Time   NA 140 09/22/2020 0857   NA 138 02/23/2020 1113   K 2.8 (L) 09/22/2020 0857   CL 92 (L) 09/22/2020 0857   CO2 32 09/22/2020 0857   GLUCOSE 141 (H) 09/22/2020 0857   BUN 13 09/22/2020 0857   BUN 15 02/23/2020 1113   CREATININE 1.49 (H) 09/22/2020 0857   CALCIUM 9.6 09/22/2020 0857   PROT 7.4 09/22/2020 0857   PROT 7.8 02/23/2020 1113   ALBUMIN 3.4 (L) 09/22/2020 0857   ALBUMIN 4.4 02/23/2020 1113   AST 12 (L) 09/22/2020 0857   ALT <6 09/22/2020 0857   ALKPHOS 83 09/22/2020 0857   BILITOT 0.4 09/22/2020 0857   GFRNONAA 39 (L) 09/22/2020 0857   GFRAA 91 02/23/2020 1113    No results found for: SPEP, UPEP  Lab Results  Component Value Date   WBC 6.1 09/22/2020   NEUTROABS 4.4 09/22/2020   HGB 8.1 (L) 09/22/2020   HCT 24.7 (L) 09/22/2020   MCV 83.7 09/22/2020   PLT 280 09/22/2020      Chemistry      Component Value Date/Time   NA 140 09/22/2020 0857   NA 138 02/23/2020  1113   K 2.8 (L) 09/22/2020 0857   CL 92 (L) 09/22/2020 0857   CO2 32 09/22/2020 0857   BUN 13 09/22/2020 0857   BUN 15 02/23/2020 1113   CREATININE 1.49 (H) 09/22/2020 0857      Component Value Date/Time   CALCIUM 9.6 09/22/2020 0857   ALKPHOS 83 09/22/2020 0857   AST 12 (L) 09/22/2020 0857   ALT <6 09/22/2020 0857   BILITOT 0.4 09/22/2020 0857

## 2020-09-22 NOTE — Assessment & Plan Note (Addendum)
She has profound weight loss and slight renal failure due to poor oral intake I plan to reduce the dose of her chemotherapy further and adjusted based on her most recent serum creatinine The patient is also quite weak from side effects of treatment and I will refer her to see cancer rehab physical therapist I will delay her next treatment by 1 week to allow more recovery after today's treatment

## 2020-09-24 ENCOUNTER — Inpatient Hospital Stay: Payer: 59

## 2020-09-24 ENCOUNTER — Other Ambulatory Visit: Payer: Self-pay

## 2020-09-24 VITALS — BP 132/52 | HR 73 | Temp 98.2°F | Resp 18

## 2020-09-24 DIAGNOSIS — Z5111 Encounter for antineoplastic chemotherapy: Secondary | ICD-10-CM | POA: Diagnosis not present

## 2020-09-24 DIAGNOSIS — C7800 Secondary malignant neoplasm of unspecified lung: Secondary | ICD-10-CM

## 2020-09-24 DIAGNOSIS — C778 Secondary and unspecified malignant neoplasm of lymph nodes of multiple regions: Secondary | ICD-10-CM

## 2020-09-24 DIAGNOSIS — C541 Malignant neoplasm of endometrium: Secondary | ICD-10-CM

## 2020-09-24 MED ORDER — POTASSIUM CHLORIDE 10 MEQ/100ML IV SOLN
10.0000 meq | INTRAVENOUS | Status: AC
  Administered 2020-09-24 (×2): 10 meq via INTRAVENOUS

## 2020-09-24 MED ORDER — MAGNESIUM SULFATE 2 GM/50ML IV SOLN
2.0000 g | Freq: Every day | INTRAVENOUS | Status: DC
  Administered 2020-09-24: 2 g via INTRAVENOUS

## 2020-09-24 MED ORDER — SODIUM CHLORIDE 0.9 % IV SOLN
Freq: Once | INTRAVENOUS | Status: AC
  Filled 2020-09-24: qty 250

## 2020-09-24 MED ORDER — POTASSIUM CHLORIDE 10 MEQ/100ML IV SOLN
INTRAVENOUS | Status: AC
  Filled 2020-09-24: qty 100

## 2020-09-24 MED ORDER — SODIUM CHLORIDE 0.9% FLUSH
10.0000 mL | Freq: Once | INTRAVENOUS | Status: AC | PRN
  Administered 2020-09-24: 10 mL
  Filled 2020-09-24: qty 10

## 2020-09-24 MED ORDER — HEPARIN SOD (PORK) LOCK FLUSH 100 UNIT/ML IV SOLN
500.0000 [IU] | Freq: Once | INTRAVENOUS | Status: AC | PRN
Start: 2020-09-24 — End: 2020-09-24
  Administered 2020-09-24: 500 [IU]
  Filled 2020-09-24: qty 5

## 2020-09-24 NOTE — Patient Instructions (Signed)
Rehydration, Adult Rehydration is the replacement of body fluids, salts, and minerals (electrolytes) that are lost during dehydration. Dehydration is when there is not enough water or other fluids in the body. This happens when you lose more fluids than you take in. Common causes of dehydration include:  Not drinking enough fluids. This can occur when you are ill or doing activities that require a lot of energy, especially in hot weather.  Conditions that cause loss of water or other fluids, such as diarrhea, vomiting, sweating, or urinating a lot.  Other illnesses, such as fever or infection.  Certain medicines, such as those that remove excess fluid from the body (diuretics). Symptoms of mild or moderate dehydration may include thirst, dry lips and mouth, and dizziness. Symptoms of severe dehydration may include increased heart rate, confusion, fainting, and not urinating. For severe dehydration, you may need to get fluids through an IV at the hospital. For mild or moderate dehydration, you can usually rehydrate at home by drinking certain fluids as told by your health care provider. What are the risks? Generally, rehydration is safe. However, taking in too much fluid (overhydration) can be a problem. This is rare. Overhydration can cause an electrolyte imbalance, kidney failure, or a decrease in salt (sodium) levels in the body. Supplies needed You will need an oral rehydration solution (ORS) if your health care provider tells you to use one. This is a drink to treat dehydration. It can be found in pharmacies and retail stores. How to rehydrate Fluids Follow instructions from your health care provider for rehydration. The kind of fluid and the amount you should drink depend on your condition. In general, you should choose drinks that you prefer.  If told by your health care provider, drink an ORS. ? Make an ORS by following instructions on the package. ? Start by drinking small amounts,  about  cup (120 mL) every 5-10 minutes. ? Slowly increase how much you drink until you have taken the amount recommended by your health care provider.  Drink enough clear fluids to keep your urine pale yellow. If you were told to drink an ORS, finish it first, then start slowly drinking other clear fluids. Drink fluids such as: ? Water. This includes sparkling water and flavored water. Drinking only water can lead to having too little sodium in your body (hyponatremia). Follow the advice of your health care provider. ? Water from ice chips you suck on. ? Fruit juice with water you add to it (diluted). ? Sports drinks. ? Hot or cold herbal teas. ? Broth-based soups. ? Milk or milk products. Food Follow instructions from your health care provider about what to eat while you rehydrate. Your health care provider may recommend that you slowly begin eating regular foods in small amounts.  Eat foods that contain a healthy balance of electrolytes, such as bananas, oranges, potatoes, tomatoes, and spinach.  Avoid foods that are greasy or contain a lot of sugar. In some cases, you may get nutrition through a feeding tube that is passed through your nose and into your stomach (nasogastric tube, or NG tube). This may be done if you have uncontrolled vomiting or diarrhea.   Beverages to avoid Certain beverages may make dehydration worse. While you rehydrate, avoid drinking alcohol.   How to tell if you are recovering from dehydration You may be recovering from dehydration if:  You are urinating more often than before you started rehydrating.  Your urine is pale yellow.  Your energy level   improves.  You vomit less frequently.  You have diarrhea less frequently.  Your appetite improves or returns to normal.  You feel less dizzy or less light-headed.  Your skin tone and color start to look more normal. Follow these instructions at home:  Take over-the-counter and prescription medicines only  as told by your health care provider.  Do not take sodium tablets. Doing this can lead to having too much sodium in your body (hypernatremia). Contact a health care provider if:  You continue to have symptoms of mild or moderate dehydration, such as: ? Thirst. ? Dry lips. ? Slightly dry mouth. ? Dizziness. ? Dark urine or less urine than normal. ? Muscle cramps.  You continue to vomit or have diarrhea. Get help right away if you:  Have symptoms of dehydration that get worse.  Have a fever.  Have a severe headache.  Have been vomiting and the following happens: ? Your vomiting gets worse or does not go away. ? Your vomit includes blood or green matter (bile). ? You cannot eat or drink without vomiting.  Have problems with urination or bowel movements, such as: ? Diarrhea that gets worse or does not go away. ? Blood in your stool (feces). This may cause stool to look black and tarry. ? Not urinating, or urinating only a small amount of very dark urine, within 6-8 hours.  Have trouble breathing.  Have symptoms that get worse with treatment. These symptoms may represent a serious problem that is an emergency. Do not wait to see if the symptoms will go away. Get medical help right away. Call your local emergency services (911 in the [place] Do not drive yourself to the hospital. Summary  Rehydration is the replacement of body fluids and minerals (electrolytes) that are lost during dehydration.  Follow instructions from your health care provider for rehydration. The kind of fluid and amount you should drink depend on your condition.  Slowly increase how much you drink until you have taken the amount recommended by your health care provider.  Contact your health care provider if you continue to show signs of mild or moderate dehydration. This information is not intended to replace advice given to you by your health care provider. Make sure you discuss any questions you have with  your health care provider. Document Revised: 06/24/2019 Document Reviewed: 05/04/2019 Elsevier Patient Education  2021 [place].  Potassium chloride injection What is this medicine? POTASSIUM CHLORIDE (poe TASS i um KLOOR ide) is a potassium supplement used to prevent and to treat low potassium. Potassium is important for the heart, muscles, and nerves. Too much or too little potassium in the body can cause serious problems. This medicine may be used for other purposes; ask your health care provider or pharmacist if you have questions. COMMON BRAND NAME(S): PROAMP What should I tell my health care provider before I take this medicine? They need to know if you have any of these conditions:  Addison disease  dehydration  diabetes (high blood sugar)  heart disease  high levels of potassium in the blood  irregular heartbeat or rhythm  kidney disease  large areas of burned skin  an unusual or allergic reaction to potassium, other medicines, foods, dyes, or preservatives  pregnant or trying to get pregnant  breast-feeding How should I use this medicine? This medicine is injected into a vein. It is given by a health care provider in a hospital or clinic setting. Talk to your health care provider about the  use of this medicine in children. Special care may be needed. Overdosage: If you think you have taken too much of this medicine contact a poison control center or emergency room at once. NOTE: This medicine is only for you. Do not share this medicine with others. What if I miss a dose? This does not apply. This medicine is not for regular use. What may interact with this medicine? Do not take this medicine with any of the following medications:  certain diuretics such as spironolactone, triamterene  eplerenone  sodium polystyrene sulfonate This medicine may also interact with the following medications:  certain medicines for blood pressure or heart disease like  lisinopril, losartan, quinapril, valsartan  medicines that lower your chance of fighting infection such as cyclosporine, tacrolimus  NSAIDs, medicines for pain and inflammation, like ibuprofen or naproxen  other potassium supplements  salt substitutes This list may not describe all possible interactions. Give your health care provider a list of all the medicines, herbs, non-prescription drugs, or dietary supplements you use. Also tell them if you smoke, drink alcohol, or use illegal drugs. Some items may interact with your medicine. What should I watch for while using this medicine? Visit your health care provider for regular checks on your progress. Tell your health care provider if your symptoms do not start to get better or if they get worse. You may need blood work while you are taking this medicine. Avoid salt substitutes unless you are told otherwise by your health care provider. What side effects may I notice from receiving this medicine? Side effects that you should report to your doctor or health care professional as soon as possible:  allergic reactions (skin rash, itching, hives, swelling of the face, lips, tongue, or throat)  confusion  high potassium levels (muscle weakness, fast or irregular heartbeat)  low blood pressure (dizziness, feeling faint or lightheaded, blurry vision)  pain, tingling, or numbness in lips, hands, or feet  pain, redness, or irritation at site where injected  trouble breathing Side effects that usually do not require medical attention (report to your doctor or health care professional if they continue or are bothersome):  diarrhea  nausea, vomiting  passing gas  stomach pain This list may not describe all possible side effects. Call your doctor for medical advice about side effects. You may report side effects to FDA at 1-800-FDA-1088. Where should I keep my medicine? This medicine is given in a hospital or clinic. It will not be stored  at home. NOTE: This sheet is a summary. It may not cover all possible information. If you have questions about this medicine, talk to your doctor, pharmacist, or health care provider.  2021 Elsevier/[employer] Standard (2019-02-19 18:18:09)  Magnesium Sulfate injection What is this medicine? MAGNESIUM SULFATE (mag NEE zee um SUL fate) is an electrolyte injection commonly used to treat low magnesium levels in your blood. It is also used to prevent or control seizures in women with preeclampsia or eclampsia. This medicine may be used for other purposes; ask your health care provider or pharmacist if you have questions. What should I tell my health care provider before I take this medicine? They need to know if you have any of these conditions:  heart disease  history of irregular heart beat  kidney disease  an unusual or allergic reaction to magnesium sulfate, medicines, foods, dyes, or preservatives  pregnant or trying to get pregnant  breast-feeding How should I use this medicine? This medicine is for infusion into a  vein. It is given by a health care professional in a hospital or clinic setting. Talk to your pediatrician regarding the use of this medicine in children. While this drug may be prescribed for selected conditions, precautions do apply. Overdosage: If you think you have taken too much of this medicine contact a poison control center or emergency room at once. NOTE: This medicine is only for you. Do not share this medicine with others. What if I miss a dose? This does not apply. What may interact with this medicine? This medicine may interact with the following medications:  certain medicines for anxiety or sleep  certain medicines for seizures like phenobarbital  digoxin  medicines that relax muscles for surgery  narcotic medicines for pain This list may not describe all possible interactions. Give your health care provider a list of all the medicines, herbs,  non-prescription drugs, or dietary supplements you use. Also tell them if you smoke, drink alcohol, or use illegal drugs. Some items may interact with your medicine. What should I watch for while using this medicine? Your condition will be monitored carefully while you are receiving this medicine. You may need blood work done while you are receiving this medicine. What side effects may I notice from receiving this medicine? Side effects that you should report to your doctor or health care professional as soon as possible:  allergic reactions like skin rash, itching or hives, swelling of the face, lips, or tongue  facial flushing  muscle weakness  signs and symptoms of low blood pressure like dizziness; feeling faint or lightheaded, falls; unusually weak or tired  signs and symptoms of a dangerous change in heartbeat or heart rhythm like chest pain; dizziness; fast or irregular heartbeat; palpitations; breathing problems  sweating This list may not describe all possible side effects. Call your doctor for medical advice about side effects. You may report side effects to FDA at 1-800-FDA-1088. Where should I keep my medicine? This drug is given in a hospital or clinic and will not be stored at home. NOTE: This sheet is a summary. It may not cover all possible information. If you have questions about this medicine, talk to your doctor, pharmacist, or health care provider.  2021 Elsevier/Gold Standard (2015-11-09 12:31:42)

## 2020-09-30 ENCOUNTER — Other Ambulatory Visit: Payer: Self-pay | Admitting: Medical

## 2020-09-30 DIAGNOSIS — E876 Hypokalemia: Secondary | ICD-10-CM

## 2020-10-12 ENCOUNTER — Other Ambulatory Visit: Payer: Self-pay | Admitting: Hematology and Oncology

## 2020-10-12 DIAGNOSIS — C778 Secondary and unspecified malignant neoplasm of lymph nodes of multiple regions: Secondary | ICD-10-CM

## 2020-10-12 DIAGNOSIS — C541 Malignant neoplasm of endometrium: Secondary | ICD-10-CM

## 2020-10-12 DIAGNOSIS — C7800 Secondary malignant neoplasm of unspecified lung: Secondary | ICD-10-CM

## 2020-10-13 ENCOUNTER — Encounter: Payer: Self-pay | Admitting: Hematology and Oncology

## 2020-10-20 ENCOUNTER — Inpatient Hospital Stay: Payer: 59

## 2020-10-20 ENCOUNTER — Inpatient Hospital Stay: Payer: 59 | Admitting: Nutrition

## 2020-10-20 ENCOUNTER — Encounter: Payer: Self-pay | Admitting: Hematology and Oncology

## 2020-10-20 ENCOUNTER — Other Ambulatory Visit: Payer: Self-pay

## 2020-10-20 ENCOUNTER — Inpatient Hospital Stay: Payer: 59 | Attending: Gynecologic Oncology

## 2020-10-20 ENCOUNTER — Telehealth: Payer: Self-pay

## 2020-10-20 ENCOUNTER — Inpatient Hospital Stay (HOSPITAL_BASED_OUTPATIENT_CLINIC_OR_DEPARTMENT_OTHER): Payer: 59 | Admitting: Hematology and Oncology

## 2020-10-20 ENCOUNTER — Other Ambulatory Visit: Payer: Self-pay | Admitting: Hematology and Oncology

## 2020-10-20 VITALS — BP 172/78 | HR 99 | Temp 97.5°F | Resp 18 | Ht 61.0 in | Wt 195.0 lb

## 2020-10-20 DIAGNOSIS — D539 Nutritional anemia, unspecified: Secondary | ICD-10-CM | POA: Insufficient documentation

## 2020-10-20 DIAGNOSIS — E876 Hypokalemia: Secondary | ICD-10-CM | POA: Diagnosis not present

## 2020-10-20 DIAGNOSIS — C778 Secondary and unspecified malignant neoplasm of lymph nodes of multiple regions: Secondary | ICD-10-CM

## 2020-10-20 DIAGNOSIS — Z79899 Other long term (current) drug therapy: Secondary | ICD-10-CM | POA: Diagnosis not present

## 2020-10-20 DIAGNOSIS — R531 Weakness: Secondary | ICD-10-CM

## 2020-10-20 DIAGNOSIS — Z5111 Encounter for antineoplastic chemotherapy: Secondary | ICD-10-CM | POA: Insufficient documentation

## 2020-10-20 DIAGNOSIS — Z5112 Encounter for antineoplastic immunotherapy: Secondary | ICD-10-CM | POA: Diagnosis present

## 2020-10-20 DIAGNOSIS — K089 Disorder of teeth and supporting structures, unspecified: Secondary | ICD-10-CM

## 2020-10-20 DIAGNOSIS — I10 Essential (primary) hypertension: Secondary | ICD-10-CM

## 2020-10-20 DIAGNOSIS — C7800 Secondary malignant neoplasm of unspecified lung: Secondary | ICD-10-CM | POA: Diagnosis not present

## 2020-10-20 DIAGNOSIS — C541 Malignant neoplasm of endometrium: Secondary | ICD-10-CM

## 2020-10-20 LAB — CBC WITH DIFFERENTIAL (CANCER CENTER ONLY)
Abs Immature Granulocytes: 0.03 10*3/uL (ref 0.00–0.07)
Basophils Absolute: 0 10*3/uL (ref 0.0–0.1)
Basophils Relative: 0 %
Eosinophils Absolute: 0 10*3/uL (ref 0.0–0.5)
Eosinophils Relative: 0 %
HCT: 24.3 % — ABNORMAL LOW (ref 36.0–46.0)
Hemoglobin: 8 g/dL — ABNORMAL LOW (ref 12.0–15.0)
Immature Granulocytes: 1 %
Lymphocytes Relative: 11 %
Lymphs Abs: 0.6 10*3/uL — ABNORMAL LOW (ref 0.7–4.0)
MCH: 30 pg (ref 26.0–34.0)
MCHC: 32.9 g/dL (ref 30.0–36.0)
MCV: 91 fL (ref 80.0–100.0)
Monocytes Absolute: 0.1 10*3/uL (ref 0.1–1.0)
Monocytes Relative: 2 %
Neutro Abs: 5 10*3/uL (ref 1.7–7.7)
Neutrophils Relative %: 86 %
Platelet Count: 289 10*3/uL (ref 150–400)
RBC: 2.67 MIL/uL — ABNORMAL LOW (ref 3.87–5.11)
RDW: 18.9 % — ABNORMAL HIGH (ref 11.5–15.5)
WBC Count: 5.8 10*3/uL (ref 4.0–10.5)
nRBC: 0 % (ref 0.0–0.2)

## 2020-10-20 LAB — CMP (CANCER CENTER ONLY)
ALT: <6 U/L (ref 0–44)
AST: 10 U/L — ABNORMAL LOW (ref 15–41)
Albumin: 3.2 g/dL — ABNORMAL LOW (ref 3.5–5.0)
Alkaline Phosphatase: 85 U/L (ref 38–126)
Anion gap: 11 (ref 5–15)
BUN: 8 mg/dL (ref 8–23)
CO2: 29 mmol/L (ref 22–32)
Calcium: 9.2 mg/dL (ref 8.9–10.3)
Chloride: 101 mmol/L (ref 98–111)
Creatinine: 0.86 mg/dL (ref 0.44–1.00)
GFR, Estimated: >60 mL/min (ref >60)
Glucose, Bld: 195 mg/dL — ABNORMAL HIGH (ref 70–99)
Potassium: 2.8 mmol/L — ABNORMAL LOW (ref 3.5–5.1)
Sodium: 141 mmol/L (ref 135–145)
Total Bilirubin: 0.5 mg/dL (ref 0.3–1.2)
Total Protein: 7.1 g/dL (ref 6.5–8.1)

## 2020-10-20 LAB — MAGNESIUM: Magnesium: 1.3 mg/dL — ABNORMAL LOW (ref 1.7–2.4)

## 2020-10-20 MED ORDER — HEPARIN SOD (PORK) LOCK FLUSH 100 UNIT/ML IV SOLN
500.0000 [IU] | Freq: Once | INTRAVENOUS | Status: AC | PRN
  Administered 2020-10-20: 500 [IU]
  Filled 2020-10-20: qty 5

## 2020-10-20 MED ORDER — SODIUM CHLORIDE 0.9 % IV SOLN
Freq: Once | INTRAVENOUS | Status: AC
Start: 2020-10-20 — End: 2020-10-20
  Filled 2020-10-20: qty 250

## 2020-10-20 MED ORDER — MAGNESIUM OXIDE -MG SUPPLEMENT 400 (240 MG) MG PO TABS: 400.0000 mg | ORAL_TABLET | Freq: Every day | ORAL | 1 refills | Status: DC

## 2020-10-20 MED ORDER — FAMOTIDINE 20 MG IN NS 100 ML IVPB
INTRAVENOUS | Status: AC
  Filled 2020-10-20: qty 100

## 2020-10-20 MED ORDER — SODIUM CHLORIDE 0.9 % IV SOLN
10.0000 mg | Freq: Once | INTRAVENOUS | Status: AC
  Administered 2020-10-20: 10 mg via INTRAVENOUS
  Filled 2020-10-20: qty 10

## 2020-10-20 MED ORDER — AMLODIPINE BESYLATE 10 MG PO TABS: ORAL_TABLET | ORAL | 1 refills | Status: DC

## 2020-10-20 MED ORDER — ACETAMINOPHEN 325 MG PO TABS
650.0000 mg | ORAL_TABLET | Freq: Once | ORAL | Status: AC
  Administered 2020-10-20: 650 mg via ORAL

## 2020-10-20 MED ORDER — FAMOTIDINE 20 MG IN NS 100 ML IVPB
20.0000 mg | Freq: Once | INTRAVENOUS | Status: AC
  Administered 2020-10-20: 20 mg via INTRAVENOUS

## 2020-10-20 MED ORDER — DIPHENHYDRAMINE HCL 50 MG/ML IJ SOLN
25.0000 mg | Freq: Once | INTRAMUSCULAR | Status: AC
  Administered 2020-10-20: 25 mg via INTRAVENOUS

## 2020-10-20 MED ORDER — SODIUM CHLORIDE 0.9 % IV SOLN
150.0000 mg | Freq: Once | INTRAVENOUS | Status: AC
  Administered 2020-10-20: 150 mg via INTRAVENOUS
  Filled 2020-10-20: qty 150

## 2020-10-20 MED ORDER — DIPHENHYDRAMINE HCL 50 MG/ML IJ SOLN
INTRAMUSCULAR | Status: AC
  Filled 2020-10-20: qty 1

## 2020-10-20 MED ORDER — PALONOSETRON HCL INJECTION 0.25 MG/5ML
0.2500 mg | Freq: Once | INTRAVENOUS | Status: AC
  Administered 2020-10-20: 0.25 mg via INTRAVENOUS

## 2020-10-20 MED ORDER — POTASSIUM CHLORIDE CRYS ER 20 MEQ PO TBCR: 20.0000 meq | EXTENDED_RELEASE_TABLET | Freq: Every day | ORAL | 1 refills | Status: DC

## 2020-10-20 MED ORDER — SODIUM CHLORIDE 0.9 % IV SOLN
87.5000 mg/m2 | Freq: Once | INTRAVENOUS | Status: AC
  Administered 2020-10-20: 168 mg via INTRAVENOUS
  Filled 2020-10-20: qty 28

## 2020-10-20 MED ORDER — PALONOSETRON HCL INJECTION 0.25 MG/5ML
INTRAVENOUS | Status: AC
  Filled 2020-10-20: qty 5

## 2020-10-20 MED ORDER — SODIUM CHLORIDE 0.9% FLUSH
10.0000 mL | INTRAVENOUS | Status: DC | PRN
  Administered 2020-10-20: 10 mL
  Filled 2020-10-20: qty 10

## 2020-10-20 MED ORDER — SODIUM CHLORIDE 0.9 % IV SOLN
465.2000 mg | Freq: Once | INTRAVENOUS | Status: AC
  Administered 2020-10-20: 470 mg via INTRAVENOUS
  Filled 2020-10-20: qty 47

## 2020-10-20 MED ORDER — MAGNESIUM SULFATE 2 GM/50ML IV SOLN
INTRAVENOUS | Status: AC
  Filled 2020-10-20: qty 100

## 2020-10-20 MED ORDER — SODIUM CHLORIDE 0.9 % IV SOLN
INTRAVENOUS | Status: DC
  Filled 2020-10-20: qty 250

## 2020-10-20 MED ORDER — SODIUM CHLORIDE 0.9 % IV SOLN
6.0000 mg/kg | Freq: Once | INTRAVENOUS | Status: AC
  Administered 2020-10-20: 525 mg via INTRAVENOUS
  Filled 2020-10-20: qty 25

## 2020-10-20 MED ORDER — SODIUM CHLORIDE 0.9% FLUSH
10.0000 mL | Freq: Once | INTRAVENOUS | Status: AC
Start: 2020-10-20 — End: 2020-10-20
  Administered 2020-10-20: 10 mL
  Filled 2020-10-20: qty 10

## 2020-10-20 MED ORDER — ACETAMINOPHEN 325 MG PO TABS
ORAL_TABLET | ORAL | Status: AC
  Filled 2020-10-20: qty 2

## 2020-10-20 MED ORDER — MAGNESIUM SULFATE 4 GM/100ML IV SOLN
4.0000 g | Freq: Every day | INTRAVENOUS | Status: DC
  Administered 2020-10-20: 4 g via INTRAVENOUS
  Filled 2020-10-20: qty 100

## 2020-10-20 NOTE — Assessment & Plan Note (Signed)
I will replace magnesium and we will prescribe oral potassium replacement therapy

## 2020-10-20 NOTE — Patient Instructions (Signed)

## 2020-10-20 NOTE — Assessment & Plan Note (Signed)
Her blood pressure is elevated today but attributed to anxiety According to the patient, her blood pressure at home is better She will continue current prescribed antihypertensives

## 2020-10-20 NOTE — Progress Notes (Signed)
Nutrition follow-up completed with patient during infusion for metastatic endometrial cancer. Weight improved to 195 pounds from 193 pounds May 19. Noted labs: Potassium 2.8, glucose 195, albumin 3.2, magnesium 1.3.  Patient reports she is doing well. She continues to report some nausea and taste alterations.  She increased her oral intake.  States she is tolerating fish without difficulty but still has taste alterations with chicken.  Her daughter prepares food for her.  She has been drinking Ensure clear and plans to order more via [employer].  She denies issues with her bowels.  Nutrition diagnosis: Unintended weight loss stabilized.  Intervention: Continue to consume increased calories and protein for weight maintenance. Oral nutrition supplements as tolerated. Bowel regimen and adequate fluids.  Monitoring, evaluation, goals: Patient will tolerate adequate calories and protein to minimize weight loss throughout treatment.  Next visit: To be scheduled as needed.  **Disclaimer: This note was dictated with voice recognition software. Similar sounding words can inadvertently be transcribed and this note may contain transcription errors which may not have been corrected upon publication of note.**

## 2020-10-20 NOTE — Progress Notes (Signed)
Per Devoria Albe [facility] and Dr. Alvy Bimler OK to run Magnesium concurrent with Taxol infusion

## 2020-10-20 NOTE — Patient Instructions (Signed)
[facility]  Discharge Instructions: Thank you for choosing [facility] to provide your oncology and hematology care.   If you have a lab appointment with the [facility], please go directly to the [facility] and check in at the registration area.   Wear comfortable clothing and clothing appropriate for easy access to any Portacath or PICC line.   We strive to give you quality time with your provider. You may need to reschedule your appointment if you arrive late (15 or more minutes).  Arriving late affects you and other patients whose appointments are after yours.  Also, if you miss three or more appointments without notifying the office, you may be dismissed from the clinic at the provider's discretion.      For prescription refill requests, have your pharmacy contact our office and allow 72 hours for refills to be completed.    Today you received the following chemotherapy and/or immunotherapy agents: Kanjinti/Taxol/Carbo.      To help prevent nausea and vomiting after your treatment, we encourage you to take your nausea medication as directed.  BELOW ARE SYMPTOMS THAT SHOULD BE REPORTED IMMEDIATELY: *FEVER GREATER THAN 100.4 F (38 C) OR HIGHER *CHILLS OR SWEATING *NAUSEA AND VOMITING THAT IS NOT CONTROLLED WITH YOUR NAUSEA MEDICATION *UNUSUAL SHORTNESS OF BREATH *UNUSUAL BRUISING OR BLEEDING *URINARY PROBLEMS (pain or burning when urinating, or frequent urination) *BOWEL PROBLEMS (unusual diarrhea, constipation, pain near the anus) TENDERNESS IN MOUTH AND THROAT WITH OR WITHOUT PRESENCE OF ULCERS (sore throat, sores in mouth, or a toothache) UNUSUAL RASH, SWELLING OR PAIN  UNUSUAL VAGINAL DISCHARGE OR ITCHING   Items with * indicate a potential emergency and should be followed up as soon as possible or go to the Emergency Department if any problems should occur.  Please show the CHEMOTHERAPY ALERT CARD or IMMUNOTHERAPY ALERT CARD at  check-in to the Emergency Department and triage nurse.  Should you have questions after your visit or need to cancel or reschedule your appointment, please contact [facility]  Dept: [phone]  and follow the prompts.  Office hours are 8:00 a.m. to 4:30 p.m. Monday - Friday. Please note that voicemails left after 4:00 p.m. may not be returned until the following business day.  We are closed weekends and major holidays. You have access to a nurse at all times for urgent questions. Please call the main number to the clinic Dept: [phone] and follow the prompts.   For any non-urgent questions, you may also contact your provider using MyChart. We now offer e-Visits for anyone 67 and older to request care online for non-urgent symptoms. For details visit [email or web address].   Also download the MyChart app! Go to the app store, search "MyChart", open the app, select [facility], and log in with your MyChart username and password.  Due to Covid, a mask is required upon entering the hospital/clinic. If you do not have a mask, one will be given to you upon arrival. For doctor visits, patients may have 1 support person aged 87 or older with them. For treatment visits, patients cannot have anyone with them due to current Covid guidelines and our immunocompromised population.   Hypomagnesemia Hypomagnesemia is a condition in which the level of magnesium in the blood is low. Magnesium is a mineral that is found in many foods. It is used in many different processes in the body. Hypomagnesemia can affect every organ in the body. In severe cases, it can  cause life-threatening problems. What are the causes? This condition may be caused by: Not getting enough magnesium in your diet. Malnutrition. Problems with absorbing magnesium from the intestines. Dehydration. Alcohol abuse. Vomiting. Severe or chronic diarrhea. Some medicines, including medicines that make you  urinate more (diuretics). Certain diseases, such as kidney disease, diabetes, celiac disease, and overactive thyroid. What are the signs or symptoms? Symptoms of this condition include: Loss of appetite. Nausea and vomiting. Involuntary shaking or trembling of a body part (tremor). Muscle weakness. Tingling in the arms and legs. Sudden tightening of muscles (muscle spasms). Confusion. Psychiatric issues, such as depression, irritability, or psychosis. A feeling of fluttering of the heart. Seizures. These symptoms are more severe if magnesium levels drop suddenly. How is this diagnosed? This condition may be diagnosed based on: Your symptoms and medical history. A physical exam. Blood and urine tests. How is this treated? Treatment depends on the cause and the severity of the condition. It may be treated with: A magnesium supplement. This can be taken in pill form. If the condition is severe, magnesium is usually given through an IV. Changes to your diet. You may be directed to eat foods that have a lot of magnesium, such as green leafy vegetables, peas, beans, and nuts. Stopping any intake of alcohol.   Follow these instructions at home: Make sure that your diet includes foods with magnesium. Foods that have a lot of magnesium in them include: Green leafy vegetables, such as spinach and broccoli. Beans and peas. Nuts and seeds, such as almonds and sunflower seeds. Whole grains, such as whole grain bread and fortified cereals. Take magnesium supplements if your health care provider tells you to do that. Take them as directed. Take over-the-counter and prescription medicines only as told by your health care provider. Have your magnesium levels monitored as told by your health care provider. When you are active, drink fluids that contain electrolytes. Avoid drinking alcohol. Keep all follow-up visits as told by your health care provider. This is important.      Contact a health  care provider if: You get worse instead of better. Your symptoms return. Get help right away if you: Develop severe muscle weakness. Have trouble breathing. Feel that your heart is racing. Summary Hypomagnesemia is a condition in which the level of magnesium in the blood is low. Hypomagnesemia can affect every organ in the body. Treatment may include eating more foods that contain magnesium, taking magnesium supplements, and not drinking alcohol. Have your magnesium levels monitored as told by your health care provider. This information is not intended to replace advice given to you by your health care provider. Make sure you discuss any questions you have with your health care provider. Document Revised: 09/24/2019 Document Reviewed: 09/24/2019 Elsevier Patient Education  [place].

## 2020-10-20 NOTE — Assessment & Plan Note (Signed)
From our last visit, I have sent referral for physical therapy Apparently, they have tried to contact her We will try to get her appointment set up

## 2020-10-20 NOTE — Assessment & Plan Note (Signed)
She has persistent hypomagnesemia I will order IV magnesium replacement She will return again on Saturday for further IV magnesium replacement therapy I will also prescribe oral magnesium replacement therapy

## 2020-10-20 NOTE — Assessment & Plan Note (Signed)
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

## 2020-10-20 NOTE — Progress Notes (Signed)
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[facility]) With significant dose adjustment, she tolerated treatment better After today's dose, she will proceed with CT imaging for objective assessment of response to treatment If her CT scan showed good response to treatment, I will stop chemotherapy and proceed with single agent Herceptin only I will see her back in 2 weeks for further follow-up  Deficiency anemia She has profound pancytopenia due to side effects of chemotherapy She has received multiple doses of IV iron recently She does not need transfusion support I plan to recheck iron studies again in her next visit and will provide transfusion support if needed  Hypomagnesemia She has persistent hypomagnesemia I will order IV magnesium replacement She will return again on Saturday for further IV magnesium replacement therapy I will also prescribe oral magnesium replacement therapy  Hypokalemia, inadequate intake I will replace magnesium and we will prescribe oral potassium replacement therapy  Essential hypertension Her blood pressure is elevated today but attributed to anxiety According to the patient, her blood pressure at home is better She will continue current prescribed antihypertensives  Generalized weakness From our last visit, I have sent referral for physical therapy Apparently, they have tried to contact her We will try to get her appointment set up  Poor dentition Her dentist required dental clearance I have completed dental clearance form for her  Orders Placed This Encounter  Procedures   CT CHEST ABDOMEN PELVIS W CONTRAST    Standing Status:   Future    Standing Expiration Date:   10/20/2021    Order Specific Question:   Preferred imaging location?    Answer:   [facility]    Order Specific Question:   Radiology Contrast Protocol - do NOT remove file path    Answer:   [email or web address]   Ferritin    Standing Status:   Future    Standing Expiration Date:   10/20/2021   Iron and TIBC    Standing Status:   Future    Standing Expiration Date:   10/20/2021   Sample to Blood Bank    Standing Status:   Standing    Number of Occurrences:   33    Standing Expiration Date:   10/20/2021    All questions were answered. The patient knows to call the clinic with any problems, questions or concerns. The total time spent in the appointment was 40 minutes encounter with patients including review of chart and various tests results, discussions about plan of care and coordination of care plan   [name], MD 10/20/2020 12:39 PM  INTERVAL HISTORY: Please see below for problem oriented charting. She returns for further follow-up She has stopped losing weight She has mild intermittent pain No recent nausea or changes in bowel habits She continues to have generalized weakness and did not receive phone call for physical therapy The patient denies any recent signs or symptoms of bleeding such as spontaneous epistaxis, hematuria or hematochezia.   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     02/26/2020 Imaging   1. 3 mm nonobstructing renal stone within the left kidney. 2. Findings suggestive  of a small hemorrhagic cyst within the lower pole of the right kidney, with an anterior right lower pole heterogeneous renal soft tissue mass. MRI correlation is recommended, as an underlying neoplastic process cannot be excluded. 3. Colonic diverticulosis. 4. Multiple exophytic uterine fibroids. 5. Evidence of prior  cholecystectomy.   03/28/2020 Imaging   2.4 cm enhancing mass in the anterior right lower kidney, suspicious for solid renal neoplasm such as renal cell carcinoma.   Single right renal artery and vein.  No renal vein invasion.   Small retroperitoneal nodes, measuring up to 9 mm short axis, mildly prominent but technically within the upper limits of normal. Attention on follow-up is suggested.   4 x 5 mm nodule in the medial right lower lobe. This is nonspecific and unlikely to reflect a metastasis but warrants attention on follow-up. Consider dedicated CT chest in 3-6 months.   3 mm nonobstructing left lower pole renal calculus. No hydronephrosis.     04/04/2020 Imaging   1. 10 mm endometrial stripe thickness. This is considered abnormal in a postmenopausal female with bleeding. In the setting of post-menopausal bleeding, endometrial sampling is indicated to exclude carcinoma.  2. Multiple uterine fibroids including potential submucosal fibroid in the lower uterine segment.   04/19/2020 Pathology Results   A. ENDOMETRIUM, BIOPSY:  -  High-grade carcinoma  Immunohistochemical and morphometric analysis performed manually   The tumor cells are POSITIVE for Her2 (3+).    04/19/2020 Procedure   She was seen by Dr. Astrid Drafts on 04/19/2020.  At that visit her cervix was noted to be nodular and firm and abnormal appearing.  Pap taken at that visit was positive for adenocarcinoma, endometrial.  An endometrial Pipelle biopsy was taken at the same visit which revealed high-grade endometrial cancer, suspicious for serous carcinoma.     05/12/2020 Cancer Staging   Staging form: Corpus Uteri - Carcinoma and Carcinosarcoma, AJCC 8th Edition - Clinical stage from 05/12/2020: FIGO Stage IVB (cT3, cN2, cM1) - Signed by [name], MD on 05/31/2020    05/30/2020 PET scan   1. Hypermetabolic lymph nodes identified in the left supraclavicular region/thoracic inlet, mediastinum, hilar regions, right  retrocrural space, para-aortic retroperitoneum, and bilateral pelvic sidewall, compatible with metastatic disease. 2. Moderate volume ascites on this noncontrast CT study largely obscures peritoneal and omental disease although hypermetabolic plaque-like uptake in the anterior high abdomen is probably peritoneal with relatively bulky hypermetabolism in the omentum. These findings are also compatible with metastatic disease. 3. Hypermetabolic left upper lobe pulmonary nodule consistent with metastatic disease. Lung primary is considered less likely but not excluded. 4. Scattered areas of focal marrow uptake are suspicious for bony metastases although no underlying abnormality is evident on CT imaging. 5. Diffuse uptake in the uterine anatomy compatible with the patient's known history of endometrial carcinoma. 6. As above, moderate volume ascites is new in the interval. 7. Enhancing lesion lower pole right kidney seen on previous CT of 03/28/2020 is less conspicuous on today's noncontrast CT imaging. Diffuse normal background FDG accumulation could obscure hypermetabolism in this lesion previously characterized as suspicious for renal cell carcinoma. 8. Bibasilar collapse/consolidation in the lower lobes.   06/01/2020 Procedure   Ultrasound and fluoroscopically guided right internal jugular single lumen power port catheter insertion. Tip in the SVC/RA junction. Catheter ready for use.   06/02/2020 Echocardiogram    1. GLS -18.2%.  2. Left ventricular ejection fraction, by estimation, is 60 to 65%. The left ventricle has normal function. The left ventricle has no  regional wall motion abnormalities. There is mild left ventricular hypertrophy. Left ventricular diastolic parameters are consistent with Grade I diastolic dysfunction (impaired relaxation).  3. Right ventricular systolic function is normal. The right ventricular size is normal.  4. The mitral valve is normal in structure. No evidence of mitral  valve regurgitation. No evidence of mitral stenosis.  5. The aortic valve is tricuspid. Aortic valve regurgitation is not visualized. No aortic stenosis is present.  6. The inferior vena cava is normal in size with greater than 50% respiratory variability, suggesting right atrial pressure of 3 mmHg.     06/03/2020 -  Chemotherapy    Patient is on Treatment Plan: UTERINE SEROUS CARCINOMA CARBOPLATIN + PACLITAXEL + TRASTUZUMAB Q21D X 6 CYCLES / TRASTUZUMAB Q21D       07/25/2020 Imaging   1. Interval response to therapy. There has been interval decrease in size of mediastinal, retrocrural, retroperitoneal, and left pelvic sidewall lymph nodes. 2. Decreased volume of ascites. Interval decrease in thickness of omental cake secondary to peritoneal disease. 3. Decrease in size of FDG avid tumor within the umbilicus. 4. Decrease in size of left upper lobe lung nodule. 5. Persistent left-sided hydronephrosis and hydroureter. No obstructing stone identified. 6. Previously noted solid enhancing lesion arising off the anterior cortex of the inferior pole of the right kidney is again noted and remains worrisome for renal cell carcinoma. 7. Aortic atherosclerosis.     08/08/2020 Echocardiogram    1. Left ventricular ejection fraction, by estimation, is 65 to 70%. The left ventricle has normal function. The left ventricle has no regional wall motion abnormalities. There is mild concentric left ventricular hypertrophy of the basal-septal segment. Left ventricular diastolic parameters are consistent with Grade I diastolic dysfunction (impaired relaxation). The average left ventricular global longitudinal strain is -18.5 %. The global longitudinal strain is normal.  2. Right ventricular systolic function is normal. The right ventricular size is normal.  3. The mitral valve is normal in structure. No evidence of mitral valve regurgitation.  4. The aortic valve is tricuspid. Aortic valve regurgitation is not  visualized. No aortic stenosis is present.  5. The inferior vena cava is normal in size with greater than 50% respiratory variability, suggesting right atrial pressure of 3 mmHg.   Comparison(s): A prior study was performed on 06/02/2020. No significant change from prior study. Prior images reviewed side by side. Similar LV deformation and strain and improved loading conditions (improved blood pressure).   Metastasis to lymph nodes ([place])  05/31/2020 Initial Diagnosis   Metastasis to lymph nodes ([place])    06/03/2020 -  Chemotherapy    Patient is on Treatment Plan: UTERINE SEROUS CARCINOMA CARBOPLATIN + PACLITAXEL + TRASTUZUMAB Q21D X 6 CYCLES / TRASTUZUMAB Q21D       Metastasis to lung ([place])  05/31/2020 Initial Diagnosis   Metastasis to lung ([place])    06/03/2020 -  Chemotherapy    Patient is on Treatment Plan: UTERINE SEROUS CARCINOMA CARBOPLATIN + PACLITAXEL + TRASTUZUMAB Q21D X 6 CYCLES / TRASTUZUMAB Q21D         REVIEW OF SYSTEMS:   Constitutional: Denies fevers, chills or abnormal weight loss Eyes: Denies blurriness of vision Ears, nose, mouth, throat, and face: Denies mucositis or sore throat Respiratory: Denies cough, dyspnea or wheezes Cardiovascular: Denies palpitation, chest discomfort or lower extremity swelling Gastrointestinal:  Denies nausea, heartburn or change in bowel habits Skin: Denies abnormal skin rashes Lymphatics: Denies new lymphadenopathy or easy bruising Neurological:Denies numbness, tingling or new weaknesses Behavioral/Psych:  Mood is stable, no new changes  All other systems were reviewed with the patient and are negative.  I have reviewed the past medical history, past surgical history, social history and family history with the patient and they are unchanged from previous note.  ALLERGIES:  has No Known Allergies.  MEDICATIONS:  Current Outpatient Medications  Medication Sig Dispense Refill   amLODipine (NORVASC) 10 MG tablet TAKE 1 TABLET(10 MG)  BY MOUTH DAILY 90 tablet 1   dexamethasone (DECADRON) 4 MG tablet Take 2 tabs at the night before and 2 tab the morning of chemotherapy, every 3 weeks, by mouth x 6 cycles 36 tablet 6   lidocaine-prilocaine (EMLA) cream Apply to affected area once 30 g 3   losartan (COZAAR) 100 MG tablet Take 1 tablet (100 mg total) by mouth daily. 90 tablet 1   magnesium oxide (MAG-OX) 400 (240 Mg) MG tablet Take 1 tablet (400 mg total) by mouth daily. 30 tablet 1   Magnesium Oxide 400 MG CAPS Take 1 capsule (400 mg total) by mouth in the morning and at bedtime. 60 capsule 3   methocarbamol (ROBAXIN) 500 MG tablet Take 1 tablet (500 mg total) by mouth every 6 (six) hours as needed for muscle spasms. 40 tablet 0   ondansetron (ZOFRAN) 8 MG tablet Take 1 tablet (8 mg total) by mouth every 8 (eight) hours as needed for refractory nausea / vomiting. Start on day 3 after carboplatin chemo. 30 tablet 1   oxyCODONE (OXY IR/ROXICODONE) 5 MG immediate release tablet Take 1 tablet (5 mg total) by mouth every 4 (four) hours as needed for severe pain. 60 tablet 0   polyethylene glycol (MIRALAX / GLYCOLAX) 17 g packet Take 17 g by mouth daily.     potassium chloride SA (KLOR-CON) 20 MEQ tablet Take 1 tablet (20 mEq total) by mouth daily. 30 tablet 1   prochlorperazine (COMPAZINE) 10 MG tablet TAKE 1 TABLET(10 MG) BY MOUTH EVERY 6 HOURS AS NEEDED FOR NAUSEA OR VOMITING 90 tablet 1   No current facility-administered medications for this visit.   Facility-Administered Medications Ordered in Other Visits  Medication Dose Route Frequency Provider Last Rate Last Admin   CARBOplatin (PARAPLATIN) 470 mg in sodium chloride 0.9 % 250 mL chemo infusion  470 mg Intravenous Once Alvy Bimler, Briceson Broadwater, MD       heparin lock flush 100 unit/mL  500 Units Intracatheter Once PRN Alvy Bimler, Horice Carrero, MD       magnesium sulfate IVPB 4 g 100 mL  4 g Intravenous Daily Sallie Maker, MD       PACLitaxel (TAXOL) 168 mg in sodium chloride 0.9 % 250 mL chemo infusion (>  67m/m2)  87.5 mg/m2 (Treatment Plan Recorded) Intravenous Once GAlvy Bimler Kavi Almquist, MD       sodium chloride flush (NS) 0.9 % injection 10 mL  10 mL Intracatheter PRN GAlvy Bimler Cortlyn Cannell, MD       trastuzumab-anns (KANJINTI) 525 mg in sodium chloride 0.9 % 250 mL chemo infusion  6 mg/kg (Treatment Plan Recorded) Intravenous Once GAlvy Bimler Omelia Marquart, MD        PHYSICAL EXAMINATION: ECOG PERFORMANCE STATUS: 1 - Symptomatic but completely ambulatory  Vitals:   10/20/20 1044  BP: (!) 172/78  Pulse: 99  Resp: 18  Temp: (!) 97.5 F (36.4 C)  SpO2: 100%   Filed Weights   10/20/20 1044  Weight: 195 lb (88.5 kg)    GENERAL:alert, no distress and comfortable SKIN: skin color, texture, turgor are normal, no rashes or  significant lesions EYES: normal, Conjunctiva are pink and non-injected, sclera clear OROPHARYNX:no exudate, no erythema and lips, buccal mucosa, and tongue normal  NECK: supple, thyroid normal size, non-tender, without nodularity LYMPH:  no palpable lymphadenopathy in the cervical, axillary or inguinal LUNGS: clear to auscultation and percussion with normal breathing effort HEART: regular rate & rhythm and no murmurs and no lower extremity edema ABDOMEN:abdomen soft, non-tender and normal bowel sounds Musculoskeletal:no cyanosis of digits and no clubbing  NEURO: alert & oriented x 3 with fluent speech, no focal motor/sensory deficits  LABORATORY DATA:  I have reviewed the data as listed    Component Value Date/Time   NA 141 10/20/2020 1004   NA 138 02/23/2020 1113   K 2.8 (L) 10/20/2020 1004   CL 101 10/20/2020 1004   CO2 29 10/20/2020 1004   GLUCOSE 195 (H) 10/20/2020 1004   BUN 8 10/20/2020 1004   BUN 15 02/23/2020 1113   CREATININE 0.86 10/20/2020 1004   CALCIUM 9.2 10/20/2020 1004   PROT 7.1 10/20/2020 1004   PROT 7.8 02/23/2020 1113   ALBUMIN 3.2 (L) 10/20/2020 1004   ALBUMIN 4.4 02/23/2020 1113   AST 10 (L) 10/20/2020 1004   ALT <6 10/20/2020 1004   ALKPHOS 85 10/20/2020 1004    BILITOT 0.5 10/20/2020 1004   GFRNONAA >60 10/20/2020 1004   GFRAA 91 02/23/2020 1113    No results found for: SPEP, UPEP  Lab Results  Component Value Date   WBC 5.8 10/20/2020   NEUTROABS 5.0 10/20/2020   HGB 8.0 (L) 10/20/2020   HCT 24.3 (L) 10/20/2020   MCV 91.0 10/20/2020   PLT 289 10/20/2020      Chemistry      Component Value Date/Time   NA 141 10/20/2020 1004   NA 138 02/23/2020 1113   K 2.8 (L) 10/20/2020 1004   CL 101 10/20/2020 1004   CO2 29 10/20/2020 1004   BUN 8 10/20/2020 1004   BUN 15 02/23/2020 1113   CREATININE 0.86 10/20/2020 1004      Component Value Date/Time   CALCIUM 9.2 10/20/2020 1004   ALKPHOS 85 10/20/2020 1004   AST 10 (L) 10/20/2020 1004   ALT <6 10/20/2020 1004   BILITOT 0.5 10/20/2020 1004

## 2020-10-20 NOTE — Assessment & Plan Note (Signed)
Her dentist required dental clearance I have completed dental clearance form for her

## 2020-10-20 NOTE — Assessment & Plan Note (Signed)
With significant dose adjustment, she tolerated treatment better After today's dose, she will proceed with CT imaging for objective assessment of response to treatment If her CT scan showed good response to treatment, I will stop chemotherapy and proceed with single agent Herceptin only I will see her back in 2 weeks for further follow-up

## 2020-10-20 NOTE — Telephone Encounter (Signed)
Given phone # for cancer rehab for PT to call and schedule appt. They have attempted to call her to schedule appt. Amanda Lin will call and schedule appt.

## 2020-10-24 ENCOUNTER — Telehealth: Payer: Self-pay

## 2020-10-24 NOTE — Telephone Encounter (Signed)
Called and given below message. Scheduled appt for 6/30 at 9 am. She is aware of appt time.

## 2020-10-24 NOTE — Telephone Encounter (Signed)
-----   Message from [name], MD sent at 10/24/2020  8:33 AM EDT ----- She is scheduled for CT on 6/29 I can see her on 6/30 for 30 mins to review CT, pls schedule

## 2020-11-02 ENCOUNTER — Inpatient Hospital Stay: Payer: 59

## 2020-11-02 ENCOUNTER — Other Ambulatory Visit (HOSPITAL_COMMUNITY): Payer: Self-pay | Admitting: Hematology and Oncology

## 2020-11-02 ENCOUNTER — Ambulatory Visit (HOSPITAL_COMMUNITY)
Admission: RE | Admit: 2020-11-02 | Discharge: 2020-11-02 | Disposition: A | Discharge status: Home / Self Care | Payer: 59 | Source: Ambulatory Visit | Attending: Hematology and Oncology | Admitting: Hematology and Oncology

## 2020-11-02 ENCOUNTER — Telehealth: Payer: Self-pay

## 2020-11-02 ENCOUNTER — Other Ambulatory Visit: Payer: Self-pay

## 2020-11-02 DIAGNOSIS — Z5112 Encounter for antineoplastic immunotherapy: Secondary | ICD-10-CM | POA: Diagnosis not present

## 2020-11-02 DIAGNOSIS — C541 Malignant neoplasm of endometrium: Secondary | ICD-10-CM

## 2020-11-02 DIAGNOSIS — C778 Secondary and unspecified malignant neoplasm of lymph nodes of multiple regions: Secondary | ICD-10-CM

## 2020-11-02 DIAGNOSIS — D539 Nutritional anemia, unspecified: Secondary | ICD-10-CM

## 2020-11-02 DIAGNOSIS — C7800 Secondary malignant neoplasm of unspecified lung: Secondary | ICD-10-CM

## 2020-11-02 LAB — CBC WITH DIFFERENTIAL (CANCER CENTER ONLY)
Abs Immature Granulocytes: 0 10*3/uL (ref 0.00–0.07)
Basophils Absolute: 0 10*3/uL (ref 0.0–0.1)
Basophils Relative: 0 %
Eosinophils Absolute: 0 10*3/uL (ref 0.0–0.5)
Eosinophils Relative: 0 %
HCT: 23.9 % — ABNORMAL LOW (ref 36.0–46.0)
Hemoglobin: 7.8 g/dL — ABNORMAL LOW (ref 12.0–15.0)
Immature Granulocytes: 0 %
Lymphocytes Relative: 45 %
Lymphs Abs: 1 10*3/uL (ref 0.7–4.0)
MCH: 30.5 pg (ref 26.0–34.0)
MCHC: 32.6 g/dL (ref 30.0–36.0)
MCV: 93.4 fL (ref 80.0–100.0)
Monocytes Absolute: 0.5 10*3/uL (ref 0.1–1.0)
Monocytes Relative: 21 %
Neutro Abs: 0.8 10*3/uL — ABNORMAL LOW (ref 1.7–7.7)
Neutrophils Relative %: 34 %
Platelet Count: 157 10*3/uL (ref 150–400)
RBC: 2.56 MIL/uL — ABNORMAL LOW (ref 3.87–5.11)
RDW: 16.4 % — ABNORMAL HIGH (ref 11.5–15.5)
WBC Count: 2.3 10*3/uL — ABNORMAL LOW (ref 4.0–10.5)
nRBC: 0 % (ref 0.0–0.2)

## 2020-11-02 LAB — CMP (CANCER CENTER ONLY)
ALT: 8 U/L (ref 0–44)
AST: 9 U/L — ABNORMAL LOW (ref 15–41)
Albumin: 3.2 g/dL — ABNORMAL LOW (ref 3.5–5.0)
Alkaline Phosphatase: 96 U/L (ref 38–126)
Anion gap: 14 (ref 5–15)
BUN: 11 mg/dL (ref 8–23)
CO2: 24 mmol/L (ref 22–32)
Calcium: 8.9 mg/dL (ref 8.9–10.3)
Chloride: 103 mmol/L (ref 98–111)
Creatinine: 0.77 mg/dL (ref 0.44–1.00)
GFR, Estimated: >60 mL/min (ref >60)
Glucose, Bld: 126 mg/dL — ABNORMAL HIGH (ref 70–99)
Potassium: 2.9 mmol/L — ABNORMAL LOW (ref 3.5–5.1)
Sodium: 141 mmol/L (ref 135–145)
Total Bilirubin: 0.3 mg/dL (ref 0.3–1.2)
Total Protein: 6.9 g/dL (ref 6.5–8.1)

## 2020-11-02 LAB — IRON AND TIBC
Iron: 75 ug/dL (ref 41–142)
Saturation Ratios: 32 % (ref 21–57)
TIBC: 235 ug/dL — ABNORMAL LOW (ref 236–444)
UIBC: 160 ug/dL (ref 120–384)

## 2020-11-02 LAB — FERRITIN: Ferritin: 866 ng/mL — ABNORMAL HIGH (ref 11–307)

## 2020-11-02 LAB — MAGNESIUM: Magnesium: 1.4 mg/dL — ABNORMAL LOW (ref 1.7–2.4)

## 2020-11-02 LAB — SAMPLE TO BLOOD BANK

## 2020-11-02 MED ORDER — ALTEPLASE 2 MG IJ SOLR
2.0000 mg | Freq: Once | INTRAMUSCULAR | Status: AC
  Administered 2020-11-02: 2 mg
  Filled 2020-11-02: qty 2

## 2020-11-02 MED ORDER — HEPARIN SOD (PORK) LOCK FLUSH 100 UNIT/ML IV SOLN: 500.0000 [IU] | Freq: Once | INTRAVENOUS | Status: AC

## 2020-11-02 MED ORDER — ALTEPLASE 2 MG IJ SOLR
INTRAMUSCULAR | Status: AC
  Filled 2020-11-02: qty 2

## 2020-11-02 MED ORDER — HEPARIN SOD (PORK) LOCK FLUSH 100 UNIT/ML IV SOLN
INTRAVENOUS | Status: AC
  Administered 2020-11-02: 500 [IU] via INTRAVENOUS
  Filled 2020-11-02: qty 5

## 2020-11-02 MED ORDER — SODIUM CHLORIDE (PF) 0.9 % IJ SOLN
INTRAMUSCULAR | Status: AC
  Filled 2020-11-02: qty 50

## 2020-11-02 MED ORDER — SODIUM CHLORIDE 0.9% FLUSH
10.0000 mL | Freq: Once | INTRAVENOUS | Status: AC
  Administered 2020-11-02: 10 mL
  Filled 2020-11-02: qty 10

## 2020-11-02 MED ORDER — IOHEXOL 300 MG/ML  SOLN
100.0000 mL | Freq: Once | INTRAMUSCULAR | Status: AC | PRN
  Administered 2020-11-02: 100 mL via INTRAVENOUS

## 2020-11-02 NOTE — Telephone Encounter (Signed)
CRITICAL VALUE STICKER  CRITICAL VALUE: HGB 7.8 This nurse received critical value from [place] in lab.  Dr. Alvy Bimler made aware.

## 2020-11-03 ENCOUNTER — Other Ambulatory Visit: Payer: Self-pay

## 2020-11-03 ENCOUNTER — Telehealth: Payer: Self-pay | Admitting: Hematology and Oncology

## 2020-11-03 ENCOUNTER — Encounter: Payer: Self-pay | Admitting: Hematology and Oncology

## 2020-11-03 ENCOUNTER — Inpatient Hospital Stay (HOSPITAL_BASED_OUTPATIENT_CLINIC_OR_DEPARTMENT_OTHER): Payer: 59 | Admitting: Hematology and Oncology

## 2020-11-03 ENCOUNTER — Inpatient Hospital Stay: Payer: 59

## 2020-11-03 ENCOUNTER — Telehealth: Payer: Self-pay

## 2020-11-03 DIAGNOSIS — Z5112 Encounter for antineoplastic immunotherapy: Secondary | ICD-10-CM | POA: Diagnosis not present

## 2020-11-03 DIAGNOSIS — C541 Malignant neoplasm of endometrium: Secondary | ICD-10-CM

## 2020-11-03 DIAGNOSIS — D539 Nutritional anemia, unspecified: Secondary | ICD-10-CM

## 2020-11-03 DIAGNOSIS — R531 Weakness: Secondary | ICD-10-CM

## 2020-11-03 DIAGNOSIS — E876 Hypokalemia: Secondary | ICD-10-CM

## 2020-11-03 LAB — ABO/RH: ABO/RH(D): B POS

## 2020-11-03 LAB — PREPARE RBC (CROSSMATCH)

## 2020-11-03 NOTE — Assessment & Plan Note (Signed)
I have reviewed CT imaging with the patient and family She had positive response to treatment She had multiple complication from treatment including severe pancytopenia, electrolyte imbalance and weight loss We will stop chemotherapy and she will start maintenance Herceptin only moving forward I will order echocardiogram next month before her next treatment

## 2020-11-03 NOTE — Progress Notes (Signed)
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 The patient is symptomatic from anemia and the hemoglobin level is critically low.  Some of the side-effects to be expected including risks of transfusion reactions, chills, infection, syndrome of volume overload and risk of hospitalization from various reasons and the patient is willing to proceed and went ahead to sign consent today. She will receive 1 unit of blood tomorrow  Hypomagnesemia This has improved She will continue magnesium supplement  Hypokalemia, inadequate intake This is improved, she will continue potassium supplement  Generalized weakness Due to side effects of treatment I will wait until after blood transfusion and recovery from chemotherapy before referring her to outpatient physical therapy and rehab  Orders Placed This Encounter  Procedures   ECHOCARDIOGRAM COMPLETE    Standing Status:   Future    Standing Expiration Date:   11/03/2021    Order Specific Question:   Where should this test be performed    Answer:   [place]    Order Specific Question:   Perflutren DEFINITY (image enhancing agent) should be administered unless hypersensitivity or allergy exist    Answer:   Administer Perflutren    Order  Specific Question:   Is a special reader required? (athlete or structural heart)    Answer:   No    Order Specific Question:   Does this study need to be read by the Structural team/Level 3 readers?    Answer:   No    Order Specific Question:   Reason for exam-Echo    Answer:   Chemo  Z09   Prepare RBC (crossmatch)    Standing Status:   Standing    Number of Occurrences:   1    Order Specific Question:   # of Units    Answer:   1 unit    Order Specific Question:   Transfusion Indications    Answer:   Symptomatic Anemia    Order Specific Question:   Number of Units to Keep Ahead    Answer:   NO units ahead    Order Specific Question:   Instructions:    Answer:   Transfuse    Order Specific Question:   If emergent release call blood bank    Answer:   Not emergent release    All questions were answered. The patient knows to call the clinic with any problems, questions or concerns. The total time spent in the appointment was 30 minutes encounter with patients including review of chart and various tests results, discussions about plan of care and coordination of care plan   Amanda Lark, MD 11/03/2020 10:07 AM  INTERVAL HISTORY: Please see below for problem oriented charting. She returns for chemo and follow-up She complained of weakness The patient denies any recent signs or symptoms of bleeding such as spontaneous epistaxis, hematuria or hematochezia. 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     02/26/2020 Imaging   1. 3 mm nonobstructing renal stone within the left kidney. 2. Findings suggestive of a small hemorrhagic cyst within the lower pole of the right kidney, with an anterior right lower pole heterogeneous renal soft tissue mass. MRI correlation  is recommended, as an underlying neoplastic process cannot be excluded. 3. Colonic diverticulosis. 4. Multiple exophytic uterine fibroids. 5. Evidence of prior cholecystectomy.   03/28/2020 Imaging   2.4 cm enhancing mass in the anterior right lower kidney, suspicious for solid renal neoplasm such as renal cell carcinoma.   Single right renal artery and vein.  No renal vein invasion.   Small retroperitoneal nodes, measuring up to 9 mm short axis, mildly prominent but technically within the upper limits of normal. Attention on follow-up is suggested.   4 x 5 mm nodule in the medial right lower lobe. This is nonspecific and unlikely to reflect a metastasis but warrants attention on follow-up. Consider dedicated CT chest in 3-6 months.   3 mm nonobstructing left lower pole renal calculus. No hydronephrosis.     04/04/2020 Imaging   1. 10 mm endometrial stripe thickness. This is considered abnormal in a postmenopausal female with bleeding. In the setting of post-menopausal bleeding, endometrial sampling is indicated to exclude carcinoma.  2. Multiple uterine fibroids including potential submucosal fibroid in the lower uterine segment.   04/19/2020 Pathology Results   A. ENDOMETRIUM, BIOPSY:  -  High-grade carcinoma  Immunohistochemical and morphometric analysis performed manually   The tumor cells are POSITIVE for Her2 (3+).    04/19/2020 Procedure   She was seen by Dr. Astrid Drafts on 04/19/2020.  At that visit her cervix was noted to be nodular and firm and abnormal appearing.  Pap taken at that visit was positive for adenocarcinoma, endometrial.  An endometrial Pipelle biopsy was taken at the same visit which revealed high-grade endometrial cancer, suspicious for serous carcinoma.     05/12/2020 Cancer Staging   Staging form: Corpus Uteri - Carcinoma and Carcinosarcoma, AJCC 8th Edition - Clinical stage from 05/12/2020: FIGO Stage IVB (cT3, cN2, cM1) - Signed by Amanda Lark, MD on  05/31/2020    05/30/2020 PET scan   1. Hypermetabolic lymph nodes identified in the left supraclavicular region/thoracic inlet, mediastinum, hilar regions, right retrocrural space, para-aortic retroperitoneum, and bilateral pelvic sidewall, compatible with metastatic disease. 2. Moderate volume ascites on this noncontrast CT study largely obscures peritoneal and omental disease although hypermetabolic plaque-like uptake in the anterior high abdomen is probably peritoneal with relatively bulky hypermetabolism in the omentum. These findings are also compatible with metastatic disease. 3. Hypermetabolic left upper lobe pulmonary nodule consistent with metastatic disease. Lung primary is considered less likely but not excluded. 4. Scattered areas of focal marrow uptake are suspicious for bony metastases although no underlying abnormality is evident on CT imaging. 5. Diffuse uptake in the uterine anatomy compatible with the patient's known history of endometrial carcinoma. 6. As above, moderate volume ascites is new in the interval. 7. Enhancing lesion lower pole right kidney seen on previous CT of 03/28/2020 is less conspicuous on today's noncontrast CT imaging. Diffuse normal background FDG accumulation could obscure hypermetabolism in this lesion previously characterized as suspicious for renal cell carcinoma. 8. Bibasilar collapse/consolidation in the  lower lobes.   06/01/2020 Procedure   Ultrasound and fluoroscopically guided right internal jugular single lumen power port catheter insertion. Tip in the SVC/RA junction. Catheter ready for use.   06/02/2020 Echocardiogram    1. GLS -18.2%.  2. Left ventricular ejection fraction, by estimation, is 60 to 65%. The left ventricle has normal function. The left ventricle has no regional wall motion abnormalities. There is mild left ventricular hypertrophy. Left ventricular diastolic parameters are consistent with Grade I diastolic dysfunction (impaired  relaxation).  3. Right ventricular systolic function is normal. The right ventricular size is normal.  4. The mitral valve is normal in structure. No evidence of mitral valve regurgitation. No evidence of mitral stenosis.  5. The aortic valve is tricuspid. Aortic valve regurgitation is not visualized. No aortic stenosis is present.  6. The inferior vena cava is normal in size with greater than 50% respiratory variability, suggesting right atrial pressure of 3 mmHg.     06/03/2020 -  Chemotherapy    Patient is on Treatment Plan: UTERINE SEROUS CARCINOMA CARBOPLATIN + PACLITAXEL + TRASTUZUMAB Q21D X 6 CYCLES / TRASTUZUMAB Q21D       07/25/2020 Imaging   1. Interval response to therapy. There has been interval decrease in size of mediastinal, retrocrural, retroperitoneal, and left pelvic sidewall lymph nodes. 2. Decreased volume of ascites. Interval decrease in thickness of omental cake secondary to peritoneal disease. 3. Decrease in size of FDG avid tumor within the umbilicus. 4. Decrease in size of left upper lobe lung nodule. 5. Persistent left-sided hydronephrosis and hydroureter. No obstructing stone identified. 6. Previously noted solid enhancing lesion arising off the anterior cortex of the inferior pole of the right kidney is again noted and remains worrisome for renal cell carcinoma. 7. Aortic atherosclerosis.     08/08/2020 Echocardiogram    1. Left ventricular ejection fraction, by estimation, is 65 to 70%. The left ventricle has normal function. The left ventricle has no regional wall motion abnormalities. There is mild concentric left ventricular hypertrophy of the basal-septal segment. Left ventricular diastolic parameters are consistent with Grade I diastolic dysfunction (impaired relaxation). The average left ventricular global longitudinal strain is -18.5 %. The global longitudinal strain is normal.  2. Right ventricular systolic function is normal. The right ventricular size is  normal.  3. The mitral valve is normal in structure. No evidence of mitral valve regurgitation.  4. The aortic valve is tricuspid. Aortic valve regurgitation is not visualized. No aortic stenosis is present.  5. The inferior vena cava is normal in size with greater than 50% respiratory variability, suggesting right atrial pressure of 3 mmHg.   Comparison(s): A prior study was performed on 06/02/2020. No significant change from prior study. Prior images reviewed side by side. Similar LV deformation and strain and improved loading conditions (improved blood pressure).   11/03/2020 Imaging   1. Overall no significant interval change in exam. 2. Signs of peritoneal carcinomatosis are again noted with perihepatic ascites and omental caking. Not significantly changed in the interval. 3. Slight decrease in size left supraclavicular and mediastinal lymph nodes. Stable borderline retroperitoneal and left pelvic lymph nodes. 4. Small pulmonary nodules are stable. 5. Stable appearance of solid enhancing lesion arising off the anterior cortex of the inferior pole of right kidney compatible with renal cell carcinoma. 6. Stable appearance of mild left hydronephrosis and left hydroureter to just before the urinary bladder. No obstructing stone identified. 7. Aortic atherosclerosis.   Metastasis to lymph nodes ([place])  05/31/2020 Initial  Diagnosis   Metastasis to lymph nodes ([place])    06/03/2020 -  Chemotherapy    Patient is on Treatment Plan: UTERINE SEROUS CARCINOMA CARBOPLATIN + PACLITAXEL + TRASTUZUMAB Q21D X 6 CYCLES / TRASTUZUMAB Q21D       Metastasis to lung ([place])  05/31/2020 Initial Diagnosis   Metastasis to lung ([place])    06/03/2020 -  Chemotherapy    Patient is on Treatment Plan: UTERINE SEROUS CARCINOMA CARBOPLATIN + PACLITAXEL + TRASTUZUMAB Q21D X 6 CYCLES / TRASTUZUMAB Q21D         REVIEW OF SYSTEMS:   Constitutional: Denies fevers, chills or abnormal weight loss Eyes: Denies blurriness  of vision Ears, nose, mouth, throat, and face: Denies mucositis or sore throat Respiratory: Denies cough, dyspnea or wheezes Cardiovascular: Denies palpitation, chest discomfort or lower extremity swelling Gastrointestinal:  Denies nausea, heartburn or change in bowel habits Skin: Denies abnormal skin rashes Lymphatics: Denies new lymphadenopathy or easy bruising Behavioral/Psych: Mood is stable, no new changes  All other systems were reviewed with the patient and are negative.  I have reviewed the past medical history, past surgical history, social history and family history with the patient and they are unchanged from previous note.  ALLERGIES:  has No Known Allergies.  MEDICATIONS:  Current Outpatient Medications  Medication Sig Dispense Refill   amLODipine (NORVASC) 10 MG tablet TAKE 1 TABLET(10 MG) BY MOUTH DAILY 90 tablet 1   dexamethasone (DECADRON) 4 MG tablet Take 2 tabs at the night before and 2 tab the morning of chemotherapy, every 3 weeks, by mouth x 6 cycles 36 tablet 6   lidocaine-prilocaine (EMLA) cream Apply to affected area once 30 g 3   losartan (COZAAR) 100 MG tablet Take 1 tablet (100 mg total) by mouth daily. 90 tablet 1   magnesium oxide (MAG-OX) 400 (240 Mg) MG tablet Take 1 tablet (400 mg total) by mouth daily. 30 tablet 1   Magnesium Oxide 400 MG CAPS Take 1 capsule (400 mg total) by mouth in the morning and at bedtime. 60 capsule 3   methocarbamol (ROBAXIN) 500 MG tablet Take 1 tablet (500 mg total) by mouth every 6 (six) hours as needed for muscle spasms. 40 tablet 0   ondansetron (ZOFRAN) 8 MG tablet Take 1 tablet (8 mg total) by mouth every 8 (eight) hours as needed for refractory nausea / vomiting. Start on day 3 after carboplatin chemo. 30 tablet 1   oxyCODONE (OXY IR/ROXICODONE) 5 MG immediate release tablet Take 1 tablet (5 mg total) by mouth every 4 (four) hours as needed for severe pain. 60 tablet 0   polyethylene glycol (MIRALAX / GLYCOLAX) 17 g packet  Take 17 g by mouth daily.     potassium chloride SA (KLOR-CON) 20 MEQ tablet Take 1 tablet (20 mEq total) by mouth daily. 30 tablet 1   prochlorperazine (COMPAZINE) 10 MG tablet TAKE 1 TABLET(10 MG) BY MOUTH EVERY 6 HOURS AS NEEDED FOR NAUSEA OR VOMITING 90 tablet 1   No current facility-administered medications for this visit.    PHYSICAL EXAMINATION: ECOG PERFORMANCE STATUS: 2 - Symptomatic, <50% confined to bed  Vitals:   11/03/20 0912  BP: (!) 157/80  Pulse: (!) 109  Resp: 19  Temp: (!) 97.1 F (36.2 C)  SpO2: 100%   Filed Weights   11/03/20 0912  Weight: 196 lb 12.8 oz (89.3 kg)    GENERAL:alert, no distress and comfortable NEURO: alert & oriented x 3 with fluent speech, no focal motor/sensory deficits  LABORATORY DATA:  I have reviewed the data as listed    Component Value Date/Time   NA 141 11/02/2020 1025   NA 138 02/23/2020 1113   K 2.9 (L) 11/02/2020 1025   CL 103 11/02/2020 1025   CO2 24 11/02/2020 1025   GLUCOSE 126 (H) 11/02/2020 1025   BUN 11 11/02/2020 1025   BUN 15 02/23/2020 1113   CREATININE 0.77 11/02/2020 1025   CALCIUM 8.9 11/02/2020 1025   PROT 6.9 11/02/2020 1025   PROT 7.8 02/23/2020 1113   ALBUMIN 3.2 (L) 11/02/2020 1025   ALBUMIN 4.4 02/23/2020 1113   AST 9 (L) 11/02/2020 1025   ALT 8 11/02/2020 1025   ALKPHOS 96 11/02/2020 1025   BILITOT 0.3 11/02/2020 1025   GFRNONAA >60 11/02/2020 1025   GFRAA 91 02/23/2020 1113    No results found for: SPEP, UPEP  Lab Results  Component Value Date   WBC 2.3 (L) 11/02/2020   NEUTROABS 0.8 (L) 11/02/2020   HGB 7.8 (L) 11/02/2020   HCT 23.9 (L) 11/02/2020   MCV 93.4 11/02/2020   PLT 157 11/02/2020      Chemistry      Component Value Date/Time   NA 141 11/02/2020 1025   NA 138 02/23/2020 1113   K 2.9 (L) 11/02/2020 1025   CL 103 11/02/2020 1025   CO2 24 11/02/2020 1025   BUN 11 11/02/2020 1025   BUN 15 02/23/2020 1113   CREATININE 0.77 11/02/2020 1025      Component Value Date/Time    CALCIUM 8.9 11/02/2020 1025   ALKPHOS 96 11/02/2020 1025   AST 9 (L) 11/02/2020 1025   ALT 8 11/02/2020 1025   BILITOT 0.3 11/02/2020 1025       RADIOGRAPHIC STUDIES: I have reviewed CT imaging with the patient I have personally reviewed the radiological images as listed and agreed with the findings in the report. CT CHEST ABDOMEN PELVIS W CONTRAST  Result Date: 11/03/2020 CLINICAL DATA:  Evaluate treatment response. History of uterine/cervical carcinoma. EXAM: CT CHEST, ABDOMEN, AND PELVIS WITH CONTRAST TECHNIQUE: Multidetector CT imaging of the chest, abdomen and pelvis was performed following the standard protocol during bolus administration of intravenous contrast. CONTRAST:  161m OMNIPAQUE IOHEXOL 300 MG/ML  SOLN COMPARISON:  07/25/2020 FINDINGS: CT CHEST FINDINGS Cardiovascular: The heart size appears within normal limits. No pericardial effusion. Aortic atherosclerosis. Mediastinum/Nodes: No enlarged mediastinal, hilar, or axillary lymph nodes. -index left supraclavicular node measures 6 mm, image 5/2. Formally 8 mm. Index left superior mediastinal node measures 0.5 cm, image 8/2. Formally 0.7 cm. Index low right paratracheal lymph node measures 0.7 cm, image 20/2. Previously this measured the same. Thyroid gland, trachea, and esophagus demonstrate no significant findings. Lungs/Pleura: No pleural effusion. No airspace consolidation, atelectasis or pneumothorax. -index nodule in the left upper lobe measures 4 mm, image 44/4. Formally this measured the same. -index nodule in the left upper lobe measures 2 mm, image 53/4. Formally 3 mm. -Index nodule within the medial right lung base measures 2 mm, image 90/4. Formally 4 mm. -Right middle lobe lung nodule measures 2 mm, image 91/4. Unchanged. Musculoskeletal: Focal area of sclerosis involving the T1 vertebral body extending into the right pedicle is unchanged, image 6/4. No acute or suspicious osseous findings. CT ABDOMEN PELVIS FINDINGS  Hepatobiliary: No suspicious liver lesion. Status post cholecystectomy. No bile duct dilatation. Pancreas: Unremarkable. No pancreatic ductal dilatation or surrounding inflammatory changes. Spleen: Normal in size without focal abnormality. Adrenals/Urinary Tract: Normal adrenal glands. Solid enhancing lesion  arising off the anterior cortex of the inferior pole of right kidney measures 2.2 cm and is unchanged from previous exam, image 63/2. Small scattered renal hypodensities are also noted most of these are technically too small to reliably characterize. Indeterminate hyperdense lesion arising off the inferior pole of the right kidney measures 1.4 cm and 69 Hounsfield units. Unchanged from previous exam. No right hydronephrosis. Mild left hydronephrosis, similar. There is left hydroureter to just before the urinary bladder. No obstructing stone identified. Stomach/Bowel: Stomach is nondistended. No signs of bowel obstruction. Vascular/Lymphatic: Aortic atherosclerosis. -Index left periaortic lymph node is stable measuring 1.1 cm, image 60/2. -Index left pelvic sidewall lymph node measures 1 cm, image 95/2. Formally 1.1 cm. Reproductive: Similar appearance of the uterus and adnexal structures. Other: Perihepatic fluid (which may be loculated) overlying the anterior and dome of right hepatic lobe is slightly decreased in volume from previous exam. This measures approximately 4 mm in thickness versus 9 mm previously. Similar appearance of the omental cake: On the left this measures 1.2 cm in thickness, image 69/2. Unchanged. On the right this measures 8 mm in thickness, image 70/2. Also unchanged. Peritoneal nodularity within the left upper quadrant of the abdomen is similar to the previous exam, image 46/2. Previous FDG avid tumor within the umbilicus which appears contiguous with the omental cake measures 2.2 x 2.0 cm, image 79/2. Formally 3.0 x 2.3 cm. Musculoskeletal: No acute or significant osseous findings.  IMPRESSION: 1. Overall no significant interval change in exam. 2. Signs of peritoneal carcinomatosis are again noted with perihepatic ascites and omental caking. Not significantly changed in the interval. 3. Slight decrease in size left supraclavicular and mediastinal lymph nodes. Stable borderline retroperitoneal and left pelvic lymph nodes. 4. Small pulmonary nodules are stable. 5. Stable appearance of solid enhancing lesion arising off the anterior cortex of the inferior pole of right kidney compatible with renal cell carcinoma. 6. Stable appearance of mild left hydronephrosis and left hydroureter to just before the urinary bladder. No obstructing stone identified. 7. Aortic atherosclerosis. Aortic Atherosclerosis (ICD10-I70.0). Electronically Signed   By: Kerby Moors M.D.   On: 11/03/2020 09:30

## 2020-11-03 NOTE — Telephone Encounter (Signed)
Called and given appt for Echo on 7/13 at 9 am, arrive at [place]. Scheduled lab appt for today at 2 pm for ABO/RH for blood transfusion appt tomorrow.

## 2020-11-03 NOTE — Assessment & Plan Note (Signed)
Due to side effects of treatment I will wait until after blood transfusion and recovery from chemotherapy before referring her to outpatient physical therapy and rehab

## 2020-11-03 NOTE — Assessment & Plan Note (Signed)
This is due to recent chemotherapy Her iron studies are adequate We discussed some of the risks, benefits, and alternatives of blood transfusions. The patient is symptomatic from anemia and the hemoglobin level is critically low.  Some of the side-effects to be expected including risks of transfusion reactions, chills, infection, syndrome of volume overload and risk of hospitalization from various reasons and the patient is willing to proceed and went ahead to sign consent today. She will receive 1 unit of blood tomorrow

## 2020-11-03 NOTE — Telephone Encounter (Signed)
-----   Message from [name], MD sent at 11/03/2020 10:07 AM EDT ----- Pls schedule echo next month, her next Rx is on 7/14, at least 2 days before that

## 2020-11-03 NOTE — Telephone Encounter (Signed)
Scheduled appointment per 06/30 sch msg. Patient is aware.

## 2020-11-03 NOTE — Assessment & Plan Note (Signed)
This is improved, she will continue potassium supplement

## 2020-11-03 NOTE — Assessment & Plan Note (Signed)
This has improved She will continue magnesium supplement

## 2020-11-04 ENCOUNTER — Inpatient Hospital Stay: Payer: 59 | Attending: Gynecologic Oncology

## 2020-11-04 ENCOUNTER — Ambulatory Visit: Payer: 59

## 2020-11-04 ENCOUNTER — Encounter: Payer: Self-pay | Admitting: Hematology and Oncology

## 2020-11-04 ENCOUNTER — Telehealth: Payer: Self-pay

## 2020-11-04 DIAGNOSIS — R634 Abnormal weight loss: Secondary | ICD-10-CM | POA: Diagnosis not present

## 2020-11-04 DIAGNOSIS — I119 Hypertensive heart disease without heart failure: Secondary | ICD-10-CM | POA: Insufficient documentation

## 2020-11-04 DIAGNOSIS — Z5111 Encounter for antineoplastic chemotherapy: Secondary | ICD-10-CM | POA: Insufficient documentation

## 2020-11-04 DIAGNOSIS — Z79899 Other long term (current) drug therapy: Secondary | ICD-10-CM | POA: Diagnosis not present

## 2020-11-04 DIAGNOSIS — D61818 Other pancytopenia: Secondary | ICD-10-CM | POA: Diagnosis not present

## 2020-11-04 DIAGNOSIS — R531 Weakness: Secondary | ICD-10-CM | POA: Insufficient documentation

## 2020-11-04 DIAGNOSIS — K5900 Constipation, unspecified: Secondary | ICD-10-CM | POA: Insufficient documentation

## 2020-11-04 DIAGNOSIS — D539 Nutritional anemia, unspecified: Secondary | ICD-10-CM

## 2020-11-04 DIAGNOSIS — E876 Hypokalemia: Secondary | ICD-10-CM | POA: Insufficient documentation

## 2020-11-04 DIAGNOSIS — C541 Malignant neoplasm of endometrium: Secondary | ICD-10-CM | POA: Diagnosis present

## 2020-11-04 MED ORDER — SODIUM CHLORIDE 0.9% IV SOLUTION
250.0000 mL | Freq: Once | INTRAVENOUS | Status: AC
  Administered 2020-11-04: 250 mL via INTRAVENOUS
  Filled 2020-11-04: qty 250

## 2020-11-04 MED ORDER — HEPARIN SOD (PORK) LOCK FLUSH 100 UNIT/ML IV SOLN
250.0000 [IU] | INTRAVENOUS | Status: AC | PRN
  Administered 2020-11-04: 500 [IU]
  Filled 2020-11-04: qty 5

## 2020-11-04 MED ORDER — DIPHENHYDRAMINE HCL 25 MG PO CAPS
ORAL_CAPSULE | ORAL | Status: AC
  Filled 2020-11-04: qty 1

## 2020-11-04 MED ORDER — ACETAMINOPHEN 325 MG PO TABS
650.0000 mg | ORAL_TABLET | Freq: Once | ORAL | Status: AC
  Administered 2020-11-04: 650 mg via ORAL

## 2020-11-04 MED ORDER — DIPHENHYDRAMINE HCL 25 MG PO CAPS
25.0000 mg | ORAL_CAPSULE | Freq: Once | ORAL | Status: AC
Start: 2020-11-04 — End: 2020-11-04
  Administered 2020-11-04: 25 mg via ORAL

## 2020-11-04 MED ORDER — SODIUM CHLORIDE 0.9% FLUSH
10.0000 mL | INTRAVENOUS | Status: AC | PRN
Start: 2020-11-04 — End: 2020-11-04
  Administered 2020-11-04: 10 mL
  Filled 2020-11-04: qty 10

## 2020-11-04 MED ORDER — ACETAMINOPHEN 325 MG PO TABS
ORAL_TABLET | ORAL | Status: AC
  Filled 2020-11-04: qty 2

## 2020-11-04 NOTE — Patient Instructions (Signed)
Blood Transfusion, Adult, Care After This sheet gives you information about how to care for yourself after your procedure. Your doctor may also give you more specific instructions. If youhave problems or questions, contact your doctor. What can I expect after the procedure? After the procedure, it is common to have: Bruising and soreness at the IV site. A fever or chills on the day of the procedure. This may be your body's response to the new blood cells received. A headache. Follow these instructions at home: Insertion site care     Follow instructions from your doctor about how to take care of your insertion site. This is where an IV tube was put into your vein. Make sure you: Wash your hands with soap and water before and after you change your bandage (dressing). If you cannot use soap and water, use hand sanitizer. Change your bandage as told by your doctor. Check your insertion site every day for signs of infection. Check for: Redness, swelling, or pain. Bleeding from the site. Warmth. Pus or a bad smell. General instructions Take over-the-counter and prescription medicines only as told by your doctor. Rest as told by your doctor. Go back to your normal activities as told by your doctor. Keep all follow-up visits as told by your doctor. This is important. Contact a doctor if: You have itching or red, swollen areas of skin (hives). You feel worried or nervous (anxious). You feel weak after doing your normal activities. You have redness, swelling, warmth, or pain around the insertion site. You have blood coming from the insertion site, and the blood does not stop with pressure. You have pus or a bad smell coming from the insertion site. Get help right away if: You have signs of a serious reaction. This may be coming from an allergy or the body's defense system (immune system). Signs include: Trouble breathing or shortness of breath. Swelling of the face or feeling warm  (flushed). Fever or chills. Head, chest, or back pain. Dark pee (urine) or blood in the pee. Widespread rash. Fast heartbeat. Feeling dizzy or light-headed. You may receive your blood transfusion in an outpatient setting. If so, youwill be told whom to contact to report any reactions. These symptoms may be an emergency. Do not wait to see if the symptoms will go away. Get medical help right away. Call your local emergency services (911 in the [place] Do not drive yourself to the hospital. Summary Bruising and soreness at the IV site are common. Check your insertion site every day for signs of infection. Rest as told by your doctor. Go back to your normal activities as told by your doctor. Get help right away if you have signs of a serious reaction. This information is not intended to replace advice given to you by your health care provider. Make sure you discuss any questions you have with your healthcare provider. Document Revised: 10/16/2018 Document Reviewed: 10/16/2018 Elsevier Patient Education  2022 [employer] Inc[employer]  

## 2020-11-04 NOTE — Telephone Encounter (Signed)
This RN attempted to contact patient 15 minutes after scheduled appointment, no answer-voicemail left.

## 2020-11-05 LAB — TYPE AND SCREEN
ABO/RH(D): B POS
Antibody Screen: NEGATIVE
Unit division: 0

## 2020-11-05 LAB — BPAM RBC
Blood Product Expiration Date: 202207222359
ISSUE DATE / TIME: 202207010901
Unit Type and Rh: 7300

## 2020-11-14 ENCOUNTER — Other Ambulatory Visit: Payer: Self-pay

## 2020-11-14 ENCOUNTER — Ambulatory Visit: Payer: 59 | Attending: Hematology and Oncology

## 2020-11-14 DIAGNOSIS — R209 Unspecified disturbances of skin sensation: Secondary | ICD-10-CM | POA: Insufficient documentation

## 2020-11-14 DIAGNOSIS — C541 Malignant neoplasm of endometrium: Secondary | ICD-10-CM | POA: Diagnosis present

## 2020-11-14 DIAGNOSIS — R531 Weakness: Secondary | ICD-10-CM | POA: Insufficient documentation

## 2020-11-14 NOTE — Therapy (Signed)
[place], [place], [address] Phone: [phone]   Fax:  [phone]  Physical Therapy Evaluation  Patient Details  Name: Amanda Lin MRN: [id number] Date of Birth: 01-Sep-1955 Referring Provider (PT): Dr. Alvy Bimler   Encounter Date: 11/14/2020   PT End of Session - 11/14/20 1759     Visit Number 1    Number of Visits 13    Date for PT Re-Evaluation 12/26/20    PT Start Time 1300    PT Stop Time 1356    PT Time Calculation (min) 56 min    Activity Tolerance Patient tolerated treatment well    Behavior During Therapy [facility] for tasks assessed/performed             Past Medical History:  Diagnosis Date   Diabetes mellitus without complication ([place])    Endometrial cancer ([place])    Hypertension     Past Surgical History:  Procedure Laterality Date   IR IMAGING GUIDED PORT INSERTION  06/01/2020   TUBAL LIGATION Bilateral     There were no vitals filed for this visit.    Subjective Assessment - 11/14/20 1308     Subjective Pt notes generalized weakness since having chemo with some numbness/tingling in her fingertips only. She was independent ambulatory prior to her dx of endometrial CA and is now walking with a SPC wih larger bottom.  She notes difficulty with balance, and difficulty rising from regular height chairs unless it has arms she can use. She has some knee arthritis and pain intermittently. Fatigues with taking her shower, and has to sit afterwards to catch her breath.  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  She had imaging done in March and at the end of June which shows the metastisis responding well to chemo and reducing in size.  She had a transfusion last week that has really helped her fatigue from  Pancytopenia    Limitations Walking;Standing;Lifting;House hold activities    How long can you sit comfortably? no limitations    How long can you stand comfortably? 10-15 min    How long can you walk comfortably? household distances 5 min then has to sit down.    Patient Stated Goals To get stronger in arms and legs, improve balance    Currently in Pain? No/denies    Pain Score 0-No pain    Multiple Pain Sites No                [facility] PT Assessment - 11/14/20 0001       Assessment   Medical Diagnosis Endometrial CA    Referring Provider (PT) Dr. Alvy Bimler    Onset Date/Surgical Date 02/26/20    Hand Dominance Right    Prior Therapy no      Precautions   Precaution Comments active Cancer      Restrictions   Weight Bearing Restrictions No      Balance Screen   Has the patient fallen in the past 6 months No    Has the patient had a decrease in activity level because of a fear of falling?  Yes    Is the patient reluctant to leave their home because of a fear of falling?  No      Home Ecologist residence  Living Arrangements Spouse/significant other;Children    Available Help at Discharge Family    Type of [place] Access Level entry    Home Layout Two level      Prior Function   Level of Independence Independent with household mobility with device    Leisure watch TV,cooking      Cognition   Overall Cognitive Status Within Functional Limits for tasks assessed      Posture/Postural Control   Posture/Postural Control Postural limitations    Postural Limitations Rounded Shoulders;Forward head      AROM   Right Shoulder Flexion 150 Degrees    Right Shoulder ABduction 154 Degrees    Right Shoulder Internal Rotation --   WNL   Right Shoulder External Rotation --   WNL   Left Shoulder Extension 55 Degrees    Left Shoulder Flexion 154 Degrees    Left Shoulder ABduction 155 Degrees    Left Shoulder Internal Rotation --   WNL    Left Shoulder External Rotation --   WNL     Strength   Right Shoulder Flexion 4+/5    Right Shoulder ABduction 4+/5    Right Shoulder Internal Rotation 4+/5    Right Shoulder External Rotation 4/5    Left Shoulder Flexion 4+/5    Left Shoulder ABduction 4+/5    Left Shoulder Internal Rotation 4+/5    Left Shoulder External Rotation 4+/5    Right Elbow Flexion 5/5    Right Elbow Extension 4+/5    Left Elbow Flexion 5/5    Left Elbow Extension 4+/5    Right Hip Flexion 2+/5    Right Hip Extension --   able to bridge   Right Hip External Rotation  4+/5    Right Hip Internal Rotation 4/5    Right Hip ABduction 2+/5    Left Hip Flexion 2+/5    Left Hip Extension --   able to bridge   Left Hip External Rotation 3/5    Left Hip Internal Rotation 3+/5    Left Hip ABduction 2+/5    Right Knee Flexion 4+/5    Right Knee Extension 4/5    Left Knee Flexion 4/5    Left Knee Extension 4/5    Right Ankle Dorsiflexion 4+/5    Right Ankle Plantar Flexion 4/5    Left Ankle Dorsiflexion 4+/5    Left Ankle Plantar Flexion 4/5      Ambulation/Gait   Gait Comments TUG avg 17.75      High Level Balance   High Level Balance Comments 4 position balance test;passed all except SLS 8 sec onleft 2 sec on right      Functional Gait  Assessment   Gait assessed  --   TUG 17.75 avg of 2 trials              LYMPHEDEMA/ONCOLOGY QUESTIONNAIRE - 11/14/20 0001       Type   Cancer Type Endometrial CA. with metastasis      Treatment   Active Chemotherapy Treatment No    Past Chemotherapy Treatment Yes    Active Radiation Treatment No    Past Radiation Treatment No                   Quick Dash - 11/14/20 0001     Open a tight or new jar Mild difficulty    Do heavy household chores (wash walls, wash floors) Unable    Carry a shopping bag or  briefcase Moderate difficulty    Wash your back Moderate difficulty    Use a knife to cut food No difficulty    Recreational activities  in which you take some force or impact through your arm, shoulder, or hand (golf, hammering, tennis) Unable    During the past week, to what extent has your arm, shoulder or hand problem interfered with your normal social activities with family, friends, neighbors, or groups? Modererately    During the past week, to what extent has your arm, shoulder or hand problem limited your work or other regular daily activities Modererately    Arm, shoulder, or hand pain. Mild    Tingling (pins and needles) in your arm, shoulder, or hand Moderate    Difficulty Sleeping Moderate difficulty    DASH Score 50 %              Objective measurements completed on examination: See above findings.                    PT Long Term Goals - 11/14/20 1816       PT LONG TERM GOAL #1   Title Pt will be independent in HEP to improve strength and balance    Time 6    Period Weeks    Target Date 12/26/20      PT LONG TERM GOAL #2   Title Pt will perform TUG in 12 seconds or less for decreasing  risk of falls    Baseline 17.75    Time 6    Period Weeks    Status New    Target Date 12/26/20      PT LONG TERM GOAL #3   Title Pt will be able to perofrm sit to stand without use of arms    Time 6    Period Weeks    Status New    Target Date 12/26/20      PT LONG TERM GOAL #4   Title Pt  will have improved hip strength by 1 full MMT or greater for improved gait    Time 6    Period Weeks    Status New    Target Date 12/26/20      PT LONG TERM GOAL #5   Title pt will be able to walk continuously on level surface for 8-10 minutes without increased fatigue    Baseline 5    Time 6    Period Weeks    Status New    Target Date 12/26/20                    Plan - 11/14/20 1802     Clinical Impression Statement pt was diagnosed in October 2022 with endometrial cancer with metastasis to multiple sites. Her cancer has responded well to chemo and is reducing.  She was left very  fatigued after chemo secondary to Pancytopenia, but got good relief from a transfusion last week and is feeling much better.  She is ambulating with a cane.  She does have decreased balance with SLS bilaterally, and has decreased LE strength with significant weakness in the hips left greater than right. Hip flexors and abd particularly weak bilaterally.  She requires use of furniture to hold for rising and her TUG time was increased at 17.75 seconds increasing her risk for falls.She will benefit from skilled therapy to improve strength, endurance and posture to return to her PLOF    Personal Factors and Comorbidities Comorbidity 3+  Comorbidities ,endometrial Cancer with Mets, diabetes, pancytopoenia, knee OA    Examination-Activity Limitations Stairs;Squat;Transfers;Stand;Locomotion Level    Stability/Clinical Decision Making Stable/Uncomplicated    Clinical Decision Making Low    Rehab Potential Good    PT Frequency 2x / week    PT Duration 6 weeks    PT Treatment/Interventions ADLs/Self Care Home Management;Gait training;Therapeutic exercise;Balance training;Neuromuscular re-education;Patient/family education;Energy conservation    PT Next Visit Plan perform sit to stand test and make goal for prn, progress strength endurance, balance, core particularly for LE's, hips very weak esp flexors and abd, left greater than right.(no bone mets noted)    Consulted and Agree with Plan of Care Patient             Patient will benefit from skilled therapeutic intervention in order to improve the following deficits and impairments:  Abnormal gait, Decreased activity tolerance, Decreased balance, Decreased knowledge of precautions, Decreased endurance, Decreased mobility, Difficulty walking, Decreased strength, Impaired sensation, Postural dysfunction  Visit Diagnosis: Endometrial cancer ([place])  Weakness generalized  Disturbance of skin sensation     Problem List Patient Active Problem List    Diagnosis Date Noted   Loose stools 09/22/2020   Generalized weakness 09/22/2020   Poor dentition 08/16/2020   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   Pancytopenia, acquired ([place]) 07/08/2020   Hypomagnesemia 07/08/2020   Hypokalemia, inadequate intake 07/08/2020   Weight loss 06/24/2020   Nausea with vomiting 06/07/2020   Metastasis to lymph nodes ([place]) 05/31/2020   Metastasis to lung ([place]) 05/31/2020   Goals of care, counseling/discussion 05/31/2020   Other constipation 05/20/2020   Deficiency anemia 05/12/2020   Diabetes mellitus ([place]) 05/12/2020   Endometrial carcinoma ([place]) 04/25/2020   Dysuria 04/20/2020   Postmenopausal bleeding 04/19/2020   Pap smear for cervical cancer screening 04/19/2020   Screening mammogram for breast cancer 04/19/2020   Elevated blood pressure reading 04/19/2020   Essential hypertension 04/19/2020   Right renal mass 02/26/2020   Kidney stone 02/26/2020   Hematuria 02/26/2020    Claris Pong 11/14/2020, 6:21 PM  [place], [place], [address] Phone: [phone]   Fax:  [phone]  Name: ROSELLE NORTON MRN: [id number] Date of Birth: 07-Jul-1955 .rob

## 2020-11-16 ENCOUNTER — Ambulatory Visit (HOSPITAL_COMMUNITY)
Admission: RE | Admit: 2020-11-16 | Discharge: 2020-11-16 | Disposition: A | Discharge status: Home / Self Care | Payer: 59 | Source: Ambulatory Visit | Attending: Hematology and Oncology | Admitting: Hematology and Oncology

## 2020-11-16 ENCOUNTER — Other Ambulatory Visit: Payer: Self-pay

## 2020-11-16 DIAGNOSIS — I10 Essential (primary) hypertension: Secondary | ICD-10-CM | POA: Diagnosis not present

## 2020-11-16 DIAGNOSIS — C541 Malignant neoplasm of endometrium: Secondary | ICD-10-CM | POA: Insufficient documentation

## 2020-11-16 DIAGNOSIS — E119 Type 2 diabetes mellitus without complications: Secondary | ICD-10-CM | POA: Insufficient documentation

## 2020-11-16 DIAGNOSIS — Z0189 Encounter for other specified special examinations: Secondary | ICD-10-CM

## 2020-11-16 DIAGNOSIS — D539 Nutritional anemia, unspecified: Secondary | ICD-10-CM

## 2020-11-16 DIAGNOSIS — R531 Weakness: Secondary | ICD-10-CM | POA: Diagnosis not present

## 2020-11-16 LAB — ECHOCARDIOGRAM COMPLETE
AR max vel: 1.8 cm2
AV Area VTI: 1.6 cm2
AV Area mean vel: 1.66 cm2
AV Mean grad: 4 mmHg
AV Peak grad: 7.1 mmHg
Ao pk vel: 1.33 m/s
Area-P 1/2: 3.6 cm2
S' Lateral: 2.4 cm

## 2020-11-17 ENCOUNTER — Inpatient Hospital Stay: Payer: 59

## 2020-11-17 ENCOUNTER — Encounter: Payer: Self-pay | Admitting: Hematology and Oncology

## 2020-11-17 ENCOUNTER — Inpatient Hospital Stay (HOSPITAL_BASED_OUTPATIENT_CLINIC_OR_DEPARTMENT_OTHER): Payer: 59 | Admitting: Hematology and Oncology

## 2020-11-17 ENCOUNTER — Other Ambulatory Visit: Payer: Self-pay | Admitting: Hematology and Oncology

## 2020-11-17 ENCOUNTER — Other Ambulatory Visit: Payer: Self-pay

## 2020-11-17 VITALS — BP 172/71 | HR 73 | Temp 97.4°F | Resp 18 | Ht 61.0 in | Wt 191.4 lb

## 2020-11-17 DIAGNOSIS — C778 Secondary and unspecified malignant neoplasm of lymph nodes of multiple regions: Secondary | ICD-10-CM

## 2020-11-17 DIAGNOSIS — E876 Hypokalemia: Secondary | ICD-10-CM | POA: Diagnosis not present

## 2020-11-17 DIAGNOSIS — C541 Malignant neoplasm of endometrium: Secondary | ICD-10-CM

## 2020-11-17 DIAGNOSIS — D61818 Other pancytopenia: Secondary | ICD-10-CM

## 2020-11-17 DIAGNOSIS — I10 Essential (primary) hypertension: Secondary | ICD-10-CM | POA: Diagnosis not present

## 2020-11-17 DIAGNOSIS — C7800 Secondary malignant neoplasm of unspecified lung: Secondary | ICD-10-CM

## 2020-11-17 DIAGNOSIS — Z5111 Encounter for antineoplastic chemotherapy: Secondary | ICD-10-CM | POA: Diagnosis not present

## 2020-11-17 DIAGNOSIS — R531 Weakness: Secondary | ICD-10-CM

## 2020-11-17 LAB — CBC WITH DIFFERENTIAL (CANCER CENTER ONLY)
Abs Immature Granulocytes: 0.02 10*3/uL (ref 0.00–0.07)
Basophils Absolute: 0 10*3/uL (ref 0.0–0.1)
Basophils Relative: 0 %
Eosinophils Absolute: 0 10*3/uL (ref 0.0–0.5)
Eosinophils Relative: 0 %
HCT: 29.5 % — ABNORMAL LOW (ref 36.0–46.0)
Hemoglobin: 9.6 g/dL — ABNORMAL LOW (ref 12.0–15.0)
Immature Granulocytes: 1 %
Lymphocytes Relative: 27 %
Lymphs Abs: 1 10*3/uL (ref 0.7–4.0)
MCH: 30 pg (ref 26.0–34.0)
MCHC: 32.5 g/dL (ref 30.0–36.0)
MCV: 92.2 fL (ref 80.0–100.0)
Monocytes Absolute: 0.6 10*3/uL (ref 0.1–1.0)
Monocytes Relative: 15 %
Neutro Abs: 2.2 10*3/uL (ref 1.7–7.7)
Neutrophils Relative %: 57 %
Platelet Count: 272 10*3/uL (ref 150–400)
RBC: 3.2 MIL/uL — ABNORMAL LOW (ref 3.87–5.11)
RDW: 17.2 % — ABNORMAL HIGH (ref 11.5–15.5)
WBC Count: 3.8 10*3/uL — ABNORMAL LOW (ref 4.0–10.5)
nRBC: 0 % (ref 0.0–0.2)

## 2020-11-17 LAB — CMP (CANCER CENTER ONLY)
ALT: 7 U/L (ref 0–44)
AST: 13 U/L — ABNORMAL LOW (ref 15–41)
Albumin: 2.9 g/dL — ABNORMAL LOW (ref 3.5–5.0)
Alkaline Phosphatase: 88 U/L (ref 38–126)
Anion gap: 9 (ref 5–15)
BUN: 8 mg/dL (ref 8–23)
CO2: 30 mmol/L (ref 22–32)
Calcium: 9 mg/dL (ref 8.9–10.3)
Chloride: 104 mmol/L (ref 98–111)
Creatinine: 0.77 mg/dL (ref 0.44–1.00)
GFR, Estimated: >60 mL/min (ref >60)
Glucose, Bld: 94 mg/dL (ref 70–99)
Potassium: 3.2 mmol/L — ABNORMAL LOW (ref 3.5–5.1)
Sodium: 143 mmol/L (ref 135–145)
Total Bilirubin: 0.4 mg/dL (ref 0.3–1.2)
Total Protein: 6.5 g/dL (ref 6.5–8.1)

## 2020-11-17 LAB — SAMPLE TO BLOOD BANK

## 2020-11-17 LAB — MAGNESIUM: Magnesium: 1.4 mg/dL — ABNORMAL LOW (ref 1.7–2.4)

## 2020-11-17 MED ORDER — DIPHENHYDRAMINE HCL 25 MG PO CAPS
ORAL_CAPSULE | ORAL | Status: AC
  Filled 2020-11-17: qty 2

## 2020-11-17 MED ORDER — TRASTUZUMAB-ANNS CHEMO 150 MG IV SOLR
525.0000 mg | Freq: Once | INTRAVENOUS | Status: AC
  Administered 2020-11-17: 525 mg via INTRAVENOUS
  Filled 2020-11-17: qty 25

## 2020-11-17 MED ORDER — HEPARIN SOD (PORK) LOCK FLUSH 100 UNIT/ML IV SOLN
500.0000 [IU] | Freq: Once | INTRAVENOUS | Status: AC | PRN
  Administered 2020-11-17: 500 [IU]
  Filled 2020-11-17: qty 5

## 2020-11-17 MED ORDER — SODIUM CHLORIDE 0.9% FLUSH
10.0000 mL | INTRAVENOUS | Status: DC | PRN
  Administered 2020-11-17: 10 mL
  Filled 2020-11-17: qty 10

## 2020-11-17 MED ORDER — SODIUM CHLORIDE 0.9% FLUSH
10.0000 mL | Freq: Once | INTRAVENOUS | Status: AC
  Administered 2020-11-17: 10 mL
  Filled 2020-11-17: qty 10

## 2020-11-17 MED ORDER — SPIRONOLACTONE 50 MG PO TABS: 50.0000 mg | ORAL_TABLET | Freq: Every day | ORAL | 0 refills | Status: DC

## 2020-11-17 MED ORDER — DIPHENHYDRAMINE HCL 25 MG PO CAPS
50.0000 mg | ORAL_CAPSULE | Freq: Once | ORAL | Status: AC
  Administered 2020-11-17: 50 mg via ORAL

## 2020-11-17 MED ORDER — ACETAMINOPHEN 325 MG PO TABS
650.0000 mg | ORAL_TABLET | Freq: Once | ORAL | Status: AC
Start: 2020-11-17 — End: 2020-11-17
  Administered 2020-11-17: 650 mg via ORAL

## 2020-11-17 MED ORDER — SODIUM CHLORIDE 0.9 % IV SOLN
Freq: Once | INTRAVENOUS | Status: AC
  Filled 2020-11-17: qty 250

## 2020-11-17 MED ORDER — ACETAMINOPHEN 325 MG PO TABS
ORAL_TABLET | ORAL | Status: AC
  Filled 2020-11-17: qty 2

## 2020-11-17 NOTE — Patient Instructions (Signed)
[facility]  Discharge Instructions: Thank you for choosing [facility] to provide your oncology and hematology care.   If you have a lab appointment with the Cancer Center, please go directly to the Cancer Center and check in at the registration area.   Wear comfortable clothing and clothing appropriate for easy access to any Portacath or PICC line.   We strive to give you quality time with your provider. You may need to reschedule your appointment if you arrive late (15 or more minutes).  Arriving late affects you and other patients whose appointments are after yours.  Also, if you miss three or more appointments without notifying the office, you may be dismissed from the clinic at the provider's discretion.      For prescription refill requests, have your pharmacy contact our office and allow 72 hours for refills to be completed.    Today you received the following chemotherapy and/or immunotherapy agents trastuzumab      To help prevent nausea and vomiting after your treatment, we encourage you to take your nausea medication as directed.  BELOW ARE SYMPTOMS THAT SHOULD BE REPORTED IMMEDIATELY: *FEVER GREATER THAN 100.4 F (38 C) OR HIGHER *CHILLS OR SWEATING *NAUSEA AND VOMITING THAT IS NOT CONTROLLED WITH YOUR NAUSEA MEDICATION *UNUSUAL SHORTNESS OF BREATH *UNUSUAL BRUISING OR BLEEDING *URINARY PROBLEMS (pain or burning when urinating, or frequent urination) *BOWEL PROBLEMS (unusual diarrhea, constipation, pain near the anus) TENDERNESS IN MOUTH AND THROAT WITH OR WITHOUT PRESENCE OF ULCERS (sore throat, sores in mouth, or a toothache) UNUSUAL RASH, SWELLING OR PAIN  UNUSUAL VAGINAL DISCHARGE OR ITCHING   Items with * indicate a potential emergency and should be followed up as soon as possible or go to the Emergency Department if any problems should occur.  Please show the CHEMOTHERAPY ALERT CARD or IMMUNOTHERAPY ALERT CARD at check-in to  the Emergency Department and triage nurse.  Should you have questions after your visit or need to cancel or reschedule your appointment, please contact [facility]  Dept: [phone]  and follow the prompts.  Office hours are 8:00 a.m. to 4:30 p.m. Monday - Friday. Please note that voicemails left after 4:00 p.m. may not be returned until the following business day.  We are closed weekends and major holidays. You have access to a nurse at all times for urgent questions. Please call the main number to the clinic Dept: [phone] and follow the prompts.   For any non-urgent questions, you may also contact your provider using MyChart. We now offer e-Visits for anyone 18 and older to request care online for non-urgent symptoms. For details visit [email or web address].   Also download the MyChart app! Go to the app store, search "MyChart", open the app, select , and log in with your MyChart username and password.  Due to Covid, a mask is required upon entering the hospital/clinic. If you do not have a mask, one will be given to you upon arrival. For doctor visits, patients may have 1 support person aged 18 or older with them. For treatment visits, patients cannot have anyone with them due to current Covid guidelines and our immunocompromised population.   

## 2020-11-17 NOTE — Progress Notes (Signed)
[place] OFFICE PROGRESS NOTE  Patient Care Team: Nicolette Bang, MD as PCP - General (Family Medicine) [name] [name], RN as Oncology Nurse Navigator (Oncology)  ASSESSMENT & PLAN:  Endometrial carcinoma [facility]) She is recovering well from recent chemotherapy Continue aggressive supportive care  Essential hypertension She has poorly controlled hypertension I recommend adding spironolactone To discontinue her oral potassium replacement therapy She will continue amlodipine and losartan as well  Hypomagnesemia This is stable She does not need IV magnesium replacement therapy She will continue oral magnesium supplement  Hypokalemia, inadequate intake This is multifactorial It has improved We will continue oral magnesium supplement only I have discontinued oral potassium supplement due to potential interaction with spironolactone Observe closely  Generalized weakness She will continue physical therapy and rehab Her recent echocardiogram was within normal range  Pancytopenia, acquired ([place]) This is improving since discontinuation of chemotherapy She does not need transfusion support  No orders of the defined types were placed in this encounter.   All questions were answered. The patient knows to call the clinic with any problems, questions or concerns. The total time spent in the appointment was 30 minutes encounter with patients including review of chart and various tests results, discussions about plan of care and coordination of care plan   [name], MD 11/17/2020 10:30 AM  INTERVAL HISTORY: Please see below for problem oriented charting. 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     02/26/2020 Imaging   1. 3 mm nonobstructing renal stone within the left kidney. 2. Findings suggestive of a small hemorrhagic cyst within the lower pole of the right kidney, with an anterior right lower pole heterogeneous renal soft tissue mass. MRI correlation is recommended, as an underlying neoplastic process cannot be excluded. 3. Colonic diverticulosis. 4. Multiple exophytic uterine fibroids. 5. Evidence of prior cholecystectomy.   03/28/2020 Imaging   2.4 cm enhancing mass in the anterior right lower kidney, suspicious for solid renal neoplasm such as renal cell carcinoma.   Single right renal artery and vein.  No renal vein invasion.   Small retroperitoneal nodes, measuring up to 9 mm short axis, mildly prominent but technically within the upper limits of normal. Attention on follow-up is suggested.   4 x 5 mm nodule in the medial right lower lobe. This is nonspecific and unlikely to reflect a metastasis but warrants attention on follow-up. Consider dedicated CT chest in 3-6 months.   3 mm nonobstructing left lower pole renal calculus. No hydronephrosis.     04/04/2020 Imaging   1. 10 mm endometrial stripe thickness. This is considered abnormal in a postmenopausal female with bleeding. In the setting of post-menopausal bleeding, endometrial sampling is indicated to exclude carcinoma.  2. Multiple uterine fibroids including potential submucosal fibroid in the lower uterine segment.   04/19/2020 Pathology Results   A. ENDOMETRIUM, BIOPSY:  -  High-grade carcinoma  Immunohistochemical and morphometric analysis performed manually   The tumor cells are POSITIVE for Her2 (3+).    04/19/2020 Procedure   She was seen by Dr. Astrid Drafts on 04/19/2020.  At that visit her cervix was  noted to be nodular and firm and abnormal appearing.   Pap taken at that visit was positive for adenocarcinoma, endometrial.  An endometrial Pip[place] biopsy was taken at the same visit which revealed high-grade endometrial cancer, suspicious for serous carcinoma.     05/12/2020 Cancer Staging   Staging form: Corpus Uteri - Carcinoma and Carcinosarcoma, AJCC 8th Edition - Clinical stage from 05/12/2020: FIGO Stage IVB (cT3, cN2, cM1) - Signed by [name], MD on 05/31/2020    05/30/2020 PET scan   1. Hypermetabolic lymph nodes identified in the left supraclavicular region/thoracic inlet, mediastinum, hilar regions, right retrocrural space, para-aortic retroperitoneum, and bilateral pelvic sidewall, compatible with metastatic disease. 2. Moderate volume ascites on this noncontrast CT study largely obscures peritoneal and omental disease although hypermetabolic plaque-like uptake in the anterior high abdomen is probably peritoneal with relatively bulky hypermetabolism in the omentum. These findings are also compatible with metastatic disease. 3. Hypermetabolic left upper lobe pulmonary nodule consistent with metastatic disease. Lung primary is considered less likely but not excluded. 4. Scattered areas of focal marrow uptake are suspicious for bony metastases although no underlying abnormality is evident on CT imaging. 5. Diffuse uptake in the uterine anatomy compatible with the patient's known history of endometrial carcinoma. 6. As above, moderate volume ascites is new in the interval. 7. Enhancing lesion lower pole right kidney seen on previous CT of 03/28/2020 is less conspicuous on today's noncontrast CT imaging. Diffuse normal background FDG accumulation could obscure hypermetabolism in this lesion previously characterized as suspicious for renal cell carcinoma. 8. Bibasilar collapse/consolidation in the lower lobes.   06/01/2020 Procedure   Ultrasound and fluoroscopically guided right internal jugular single lumen power port catheter insertion. Tip in  the SVC/RA junction. Catheter ready for use.   06/02/2020 Echocardiogram    1. GLS -18.2%.  2. Left ventricular ejection fraction, by estimation, is 60 to 65%. The left ventricle has normal function. The left ventricle has no regional wall motion abnormalities. There is mild left ventricular hypertrophy. Left ventricular diastolic parameters are consistent with Grade I diastolic dysfunction (impaired relaxation).  3. Right ventricular systolic function is normal. The right ventricular size is normal.  4. The mitral valve is normal in structure. No evidence of mitral valve regurgitation. No evidence of mitral stenosis.  5. The aortic valve is tricuspid. Aortic valve regurgitation is not visualized. No aortic stenosis is present.  6. The inferior vena cava is normal in size with greater than 50% respiratory variability, suggesting right atrial pressure of 3 mmHg.     06/03/2020 -  Chemotherapy    Patient is on Treatment Plan: UTERINE SEROUS CARCINOMA CARBOPLATIN + PACLITAXEL + TRASTUZUMAB Q21D X 6 CYCLES / TRASTUZUMAB Q21D       07/25/2020 Imaging   1. Interval response to therapy. There has been interval decrease in size of mediastinal, retrocrural, retroperitoneal, and left pelvic sidewall lymph nodes. 2. Decreased volume of ascites. Interval decrease in thickness of omental cake secondary to peritoneal disease. 3. Decrease in size of FDG avid tumor within the umbilicus. 4. Decrease in size of left upper lobe lung nodule. 5. Persistent left-sided hydronephrosis and hydroureter. No obstructing stone identified. 6. Previously noted solid enhancing lesion arising off the anterior cortex of the inferior pole of the right kidney is again noted and remains worrisome for renal cell carcinoma. 7. Aortic atherosclerosis.     08/08/2020 Echocardiogram    1. Left ventricular ejection fraction, by estimation, is 65 to 70%. The left ventricle has normal function.  The left ventricle has no regional wall  motion abnormalities. There is mild concentric left ventricular hypertrophy of the basal-septal segment. Left ventricular diastolic parameters are consistent with Grade I diastolic dysfunction (impaired relaxation). The average left ventricular global longitudinal strain is -18.5 %. The global longitudinal strain is normal.  2. Right ventricular systolic function is normal. The right ventricular size is normal.  3. The mitral valve is normal in structure. No evidence of mitral valve regurgitation.  4. The aortic valve is tricuspid. Aortic valve regurgitation is not visualized. No aortic stenosis is present.  5. The inferior vena cava is normal in size with greater than 50% respiratory variability, suggesting right atrial pressure of 3 mmHg.   Comparison(s): A prior study was performed on 06/02/2020. No significant change from prior study. Prior images reviewed side by side. Similar LV deformation and strain and improved loading conditions (improved blood pressure).   11/03/2020 Imaging   1. Overall no significant interval change in exam. 2. Signs of peritoneal carcinomatosis are again noted with perihepatic ascites and omental caking. Not significantly changed in the interval. 3. Slight decrease in size left supraclavicular and mediastinal lymph nodes. Stable borderline retroperitoneal and left pelvic lymph nodes. 4. Small pulmonary nodules are stable. 5. Stable appearance of solid enhancing lesion arising off the anterior cortex of the inferior pole of right kidney compatible with renal cell carcinoma. 6. Stable appearance of mild left hydronephrosis and left hydroureter to just before the urinary bladder. No obstructing stone identified. 7. Aortic atherosclerosis.   11/16/2020 Echocardiogram    1. Left ventricular ejection fraction, by estimation, is 55 to 60%. The left ventricle has normal function. The left ventricle has no regional wall motion abnormalities. There is moderate left ventricular  hypertrophy. Left ventricular diastolic parameters are consistent with Grade I diastolic dysfunction (impaired relaxation). The average left ventricular global longitudinal strain is -18.0 %. The global longitudinal strain is normal.  2. Right ventricular systolic function is normal. The right ventricular size is normal. Tricuspid regurgitation signal is inadequate for assessing PA pressure.  3. The mitral valve is normal in structure. No evidence of mitral valve regurgitation. No evidence of mitral stenosis.  4. The aortic valve is tricuspid. Aortic valve regurgitation is not visualized. Mild aortic valve sclerosis is present, with no evidence of aortic valve stenosis.  5. The inferior vena cava is normal in size with greater than 50% respiratory variability, suggesting right atrial pressure of 3 mmHg.     Metastasis to lymph nodes ([place])  05/31/2020 Initial Diagnosis   Metastasis to lymph nodes ([place])    06/03/2020 -  Chemotherapy    Patient is on Treatment Plan: UTERINE SEROUS CARCINOMA CARBOPLATIN + PACLITAXEL + TRASTUZUMAB Q21D X 6 CYCLES / TRASTUZUMAB Q21D       Metastasis to lung ([place])  05/31/2020 Initial Diagnosis   Metastasis to lung ([place])    06/03/2020 -  Chemotherapy    Patient is on Treatment Plan: UTERINE SEROUS CARCINOMA CARBOPLATIN + PACLITAXEL + TRASTUZUMAB Q21D X 6 CYCLES / TRASTUZUMAB Q21D         REVIEW OF SYSTEMS:   Constitutional: Denies fevers, chills or abnormal weight loss Eyes: Denies blurriness of vision Ears, nose, mouth, throat, and face: Denies mucositis or sore throat Respiratory: Denies cough, dyspnea or wheezes Cardiovascular: Denies palpitation, chest discomfort or lower extremity swelling Gastrointestinal:  Denies nausea, heartburn or change in bowel habits Skin: Denies abnormal skin rashes Lymphatics: Denies new lymphadenopathy or easy bruising Neurological:Denies numbness, tingling or new  weaknesses Behavioral/Psych: Mood is stable, no new  changes  All other systems were reviewed with the patient and are negative.  I have reviewed the past medical history, past surgical history, social history and family history with the patient and they are unchanged from previous note.  ALLERGIES:  has No Known Allergies.  MEDICATIONS:  Current Outpatient Medications  Medication Sig Dispense Refill   spironolactone (ALDACTONE) 50 MG tablet Take 1 tablet (50 mg total) by mouth daily. 30 tablet 0   amLODipine (NORVASC) 10 MG tablet TAKE 1 TABLET(10 MG) BY MOUTH DAILY 90 tablet 1   lidocaine-prilocaine (EMLA) cream Apply to affected area once 30 g 3   losartan (COZAAR) 100 MG tablet Take 1 tablet (100 mg total) by mouth daily. 90 tablet 1   magnesium oxide (MAG-OX) 400 (240 Mg) MG tablet Take 1 tablet (400 mg total) by mouth daily. 30 tablet 1   ondansetron (ZOFRAN) 8 MG tablet Take 1 tablet (8 mg total) by mouth every 8 (eight) hours as needed for refractory nausea / vomiting. Start on day 3 after carboplatin chemo. 30 tablet 1   oxyCODONE (OXY IR/ROXICODONE) 5 MG immediate release tablet Take 1 tablet (5 mg total) by mouth every 4 (four) hours as needed for severe pain. 60 tablet 0   polyethylene glycol (MIRALAX / GLYCOLAX) 17 g packet Take 17 g by mouth daily.     prochlorperazine (COMPAZINE) 10 MG tablet TAKE 1 TABLET(10 MG) BY MOUTH EVERY 6 HOURS AS NEEDED FOR NAUSEA OR VOMITING 90 tablet 1   No current facility-administered medications for this visit.    PHYSICAL EXAMINATION: ECOG PERFORMANCE STATUS: 1 - Symptomatic but completely ambulatory  Vitals:   11/17/20 0950  BP: (!) 172/71  Pulse: 73  Resp: 18  Temp: (!) 97.4 F (36.3 C)  SpO2: 99%   Filed Weights   11/17/20 0950  Weight: 191 lb 6.4 oz (86.8 kg)    GENERAL:alert, no distress and comfortable SKIN: skin color, texture, turgor are normal, no rashes or significant lesions EYES: normal, Conjunctiva are pink and non-injected, sclera clear OROPHARYNX:no exudate, no  erythema and lips, buccal mucosa, and tongue normal  NECK: supple, thyroid normal size, non-tender, without nodularity LYMPH:  no palpable lymphadenopathy in the cervical, axillary or inguinal LUNGS: clear to auscultation and percussion with normal breathing effort HEART: regular rate & rhythm and no murmurs and no lower extremity edema ABDOMEN:abdomen soft, non-tender and normal bowel sounds Musculoskeletal:no cyanosis of digits and no clubbing  NEURO: alert & oriented x 3 with fluent speech, no focal motor/sensory deficits  LABORATORY DATA:  I have reviewed the data as listed    Component Value Date/Time   NA 143 11/17/2020 0932   NA 138 02/23/2020 1113   K 3.2 (L) 11/17/2020 0932   CL 104 11/17/2020 0932   CO2 30 11/17/2020 0932   GLUCOSE 94 11/17/2020 0932   BUN 8 11/17/2020 0932   BUN 15 02/23/2020 1113   CREATININE 0.77 11/17/2020 0932   CALCIUM 9.0 11/17/2020 0932   PROT 6.5 11/17/2020 0932   PROT 7.8 02/23/2020 1113   ALBUMIN 2.9 (L) 11/17/2020 0932   ALBUMIN 4.4 02/23/2020 1113   AST 13 (L) 11/17/2020 0932   ALT 7 11/17/2020 0932   ALKPHOS 88 11/17/2020 0932   BILITOT 0.4 11/17/2020 0932   GFRNONAA >60 11/17/2020 0932   GFRAA 91 02/23/2020 1113    No results found for: SPEP, UPEP  Lab Results  Component Value Date   WBC  3.8 (L) 11/17/2020   NEUTROABS 2.2 11/17/2020   HGB 9.6 (L) 11/17/2020   HCT 29.5 (L) 11/17/2020   MCV 92.2 11/17/2020   PLT 272 11/17/2020      Chemistry      Component Value Date/Time   NA 143 11/17/2020 0932   NA 138 02/23/2020 1113   K 3.2 (L) 11/17/2020 0932   CL 104 11/17/2020 0932   CO2 30 11/17/2020 0932   BUN 8 11/17/2020 0932   BUN 15 02/23/2020 1113   CREATININE 0.77 11/17/2020 0932      Component Value Date/Time   CALCIUM 9.0 11/17/2020 0932   ALKPHOS 88 11/17/2020 0932   AST 13 (L) 11/17/2020 0932   ALT 7 11/17/2020 0932   BILITOT 0.4 11/17/2020 0932       RADIOGRAPHIC STUDIES: I have personally reviewed the  radiological images as listed and agreed with the findings in the report. CT CHEST ABDOMEN PELVIS W CONTRAST  Result Date: 11/03/2020 CLINICAL DATA:  Evaluate treatment response. History of uterine/cervical carcinoma. EXAM: CT CHEST, ABDOMEN, AND PELVIS WITH CONTRAST TECHNIQUE: Multidetector CT imaging of the chest, abdomen and pelvis was performed following the standard protocol during bolus administration of intravenous contrast. CONTRAST:  18m OMNIPAQUE IOHEXOL 300 MG/ML  SOLN COMPARISON:  07/25/2020 FINDINGS: CT CHEST FINDINGS Cardiovascular: The heart size appears within normal limits. No pericardial effusion. Aortic atherosclerosis. Mediastinum/Nodes: No enlarged mediastinal, hilar, or axillary lymph nodes. -index left supraclavicular node measures 6 mm, image 5/2. Formally 8 mm. Index left superior mediastinal node measures 0.5 cm, image 8/2. Formally 0.7 cm. Index low right paratracheal lymph node measures 0.7 cm, image 20/2. Previously this measured the same. Thyroid gland, trachea, and esophagus demonstrate no significant findings. Lungs/Pleura: No pleural effusion. No airspace consolidation, atelectasis or pneumothorax. -index nodule in the left upper lobe measures 4 mm, image 44/4. Formally this measured the same. -index nodule in the left upper lobe measures 2 mm, image 53/4. Formally 3 mm. -Index nodule within the medial right lung base measures 2 mm, image 90/4. Formally 4 mm. -Right middle lobe lung nodule measures 2 mm, image 91/4. Unchanged. Musculoskeletal: Focal area of sclerosis involving the T1 vertebral body extending into the right pedicle is unchanged, image 6/4. No acute or suspicious osseous findings. CT ABDOMEN PELVIS FINDINGS Hepatobiliary: No suspicious liver lesion. Status post cholecystectomy. No bile duct dilatation. Pancreas: Unremarkable. No pancreatic ductal dilatation or surrounding inflammatory changes. Spleen: Normal in size without focal abnormality. Adrenals/Urinary  Tract: Normal adrenal glands. Solid enhancing lesion arising off the anterior cortex of the inferior pole of right kidney measures 2.2 cm and is unchanged from previous exam, image 63/2. Small scattered renal hypodensities are also noted most of these are technically too small to reliably characterize. Indeterminate hyperdense lesion arising off the inferior pole of the right kidney measures 1.4 cm and 69 Hounsfield units. Unchanged from previous exam. No right hydronephrosis. Mild left hydronephrosis, similar. There is left hydroureter to just before the urinary bladder. No obstructing stone identified. Stomach/Bowel: Stomach is nondistended. No signs of bowel obstruction. Vascular/Lymphatic: Aortic atherosclerosis. -Index left periaortic lymph node is stable measuring 1.1 cm, image 60/2. -Index left pelvic sidewall lymph node measures 1 cm, image 95/2. Formally 1.1 cm. Reproductive: Similar appearance of the uterus and adnexal structures. Other: Perihepatic fluid (which may be loculated) overlying the anterior and dome of right hepatic lobe is slightly decreased in volume from previous exam. This measures approximately 4 mm in thickness versus 9 mm previously. Similar appearance  of the omental cake: On the left this measures 1.2 cm in thickness, image 69/2. Unchanged. On the right this measures 8 mm in thickness, image 70/2. Also unchanged. Peritoneal nodularity within the left upper quadrant of the abdomen is similar to the previous exam, image 46/2. Previous FDG avid tumor within the umbilicus which appears contiguous with the omental cake measures 2.2 x 2.0 cm, image 79/2. Formally 3.0 x 2.3 cm. Musculoskeletal: No acute or significant osseous findings. IMPRESSION: 1. Overall no significant interval change in exam. 2. Signs of peritoneal carcinomatosis are again noted with perihepatic ascites and omental caking. Not significantly changed in the interval. 3. Slight decrease in size left supraclavicular and  mediastinal lymph nodes. Stable borderline retroperitoneal and left pelvic lymph nodes. 4. Small pulmonary nodules are stable. 5. Stable appearance of solid enhancing lesion arising off the anterior cortex of the inferior pole of right kidney compatible with renal cell carcinoma. 6. Stable appearance of mild left hydronephrosis and left hydroureter to just before the urinary bladder. No obstructing stone identified. 7. Aortic atherosclerosis. Aortic Atherosclerosis (ICD10-I70.0). Electronically Signed   By: Kerby Moors M.D.   On: 11/03/2020 09:30   ECHOCARDIOGRAM COMPLETE  Result Date: 11/16/2020    ECHOCARDIOGRAM REPORT   Patient Name:   CHRISTABELLE HANZLIK Date of Exam: 11/16/2020 Medical Rec #:  [id number]         Height:       61.0 in Accession #:    [id number]        Weight:       196.8 lb Date of Birth:  1955/07/09         BSA:          1.876 m Patient Age:    65 years          BP:           186/93 mmHg Patient Gender: F                 HR:           82 bpm. Exam Location:  Outpatient Procedure: 2D Echo, Cardiac Doppler and Color Doppler Indications:    Chemo  History:        Patient has prior history of Echocardiogram examinations, most                 recent 08/08/2020. Risk Factors:Diabetes and Hypertension.  Sonographer:    Cammy Brochure Referring Phys: [id number] Jacklynn Dehaas [place]  1. Left ventricular ejection fraction, by estimation, is 55 to 60%. The left ventricle has normal function. The left ventricle has no regional wall motion abnormalities. There is moderate left ventricular hypertrophy. Left ventricular diastolic parameters are consistent with Grade I diastolic dysfunction (impaired relaxation). The average left ventricular global longitudinal strain is -18.0 %. The global longitudinal strain is normal.  2. Right ventricular systolic function is normal. The right ventricular size is normal. Tricuspid regurgitation signal is inadequate for assessing PA pressure.  3. The mitral valve is  normal in structure. No evidence of mitral valve regurgitation. No evidence of mitral stenosis.  4. The aortic valve is tricuspid. Aortic valve regurgitation is not visualized. Mild aortic valve sclerosis is present, with no evidence of aortic valve stenosis.  5. The inferior vena cava is normal in size with greater than 50% respiratory variability, suggesting right atrial pressure of 3 mmHg. FINDINGS  Left Ventricle: Left ventricular ejection fraction, by estimation, is 55 to 60%. The left ventricle has normal function.  The left ventricle has no regional wall motion abnormalities. The average left ventricular global longitudinal strain is -18.0 %. The global longitudinal strain is normal. The left ventricular internal cavity size was normal in size. There is moderate left ventricular hypertrophy. Left ventricular diastolic parameters are consistent with Grade I diastolic dysfunction (impaired relaxation). Right Ventricle: The right ventricular size is normal. No increase in right ventricular wall thickness. Right ventricular systolic function is normal. Tricuspid regurgitation signal is inadequate for assessing PA pressure. Left Atrium: Left atrial size was normal in size. Right Atrium: Right atrial size was normal in size. Pericardium: There is no evidence of pericardial effusion. Mitral Valve: The mitral valve is normal in structure. No evidence of mitral valve regurgitation. No evidence of mitral valve stenosis. Tricuspid Valve: The tricuspid valve is normal in structure. Tricuspid valve regurgitation is not demonstrated. Aortic Valve: The aortic valve is tricuspid. Aortic valve regurgitation is not visualized. Mild aortic valve sclerosis is present, with no evidence of aortic valve stenosis. Aortic valve mean gradient measures 4.0 mmHg. Aortic valve peak gradient measures 7.1 mmHg. Aortic valve area, by VTI measures 1.60 cm. Pulmonic Valve: The pulmonic valve was normal in structure. Pulmonic valve  regurgitation is not visualized. Aorta: The aortic root is normal in size and structure. Venous: The inferior vena cava is normal in size with greater than 50% respiratory variability, suggesting right atrial pressure of 3 mmHg. IAS/Shunts: No atrial level shunt detected by color flow Doppler.  LEFT VENTRICLE PLAX 2D LVIDd:         3.50 cm  Diastology LVIDs:         2.40 cm  LV e' medial:    4.79 cm/s LV PW:         1.20 cm  LV E/e' medial:  15.5 LV IVS:        1.40 cm  LV e' lateral:   6.96 cm/s LVOT diam:     1.60 cm  LV E/e' lateral: 10.7 LV SV:         46 LV SV Index:   25       2D Longitudinal Strain LVOT Area:     2.01 cm 2D Strain GLS Avg:     -18.0 %  RIGHT VENTRICLE RV Basal diam:  3.70 cm RV S prime:     13.80 cm/s TAPSE (M-mode): 2.3 cm LEFT ATRIUM             Index       RIGHT ATRIUM           Index LA diam:        3.20 cm 1.71 cm/m  RA Area:     11.06 cm LA Vol (A2C):   44.8 ml 23.88 ml/m RA Volume:   25.33 ml  13.50 ml/m LA Vol (A4C):   45.0 ml 23.99 ml/m LA Biplane Vol: 45.7 ml 24.36 ml/m  AORTIC VALVE AV Area (Vmax):    1.80 cm AV Area (Vmean):   1.66 cm AV Area (VTI):     1.60 cm AV Vmax:           133.00 cm/s AV Vmean:          92.550 cm/s AV VTI:            0.289 m AV Peak Grad:      7.1 mmHg AV Mean Grad:      4.0 mmHg LVOT Vmax:         119.00 cm/s LVOT Vmean:  76.400 cm/s LVOT VTI:          0.230 m LVOT/AV VTI ratio: 0.80  AORTA Ao Root diam: 2.70 cm Ao Asc diam:  2.60 cm MITRAL VALVE MV Area (PHT): 3.60 cm     SHUNTS MV Decel Time: 211 msec     Systemic VTI:  0.23 m MV E velocity: 74.40 cm/s   Systemic Diam: 1.60 cm MV A velocity: 112.00 cm/s MV E/A ratio:  0.66 Loralie Champagne MD Electronically signed by Loralie Champagne MD Signature Date/Time: 11/16/2020/2:44:45 PM    Final

## 2020-11-17 NOTE — Assessment & Plan Note (Signed)
This is multifactorial It has improved We will continue oral magnesium supplement only I have discontinued oral potassium supplement due to potential interaction with spironolactone Observe closely

## 2020-11-17 NOTE — Assessment & Plan Note (Signed)
She will continue physical therapy and rehab Her recent echocardiogram was within normal range

## 2020-11-17 NOTE — Assessment & Plan Note (Signed)
She has poorly controlled hypertension I recommend adding spironolactone To discontinue her oral potassium replacement therapy She will continue amlodipine and losartan as well

## 2020-11-17 NOTE — Progress Notes (Signed)
Ok to treat today with elevated BP and low potassium/magnesium labs per Dr.Gorsuch

## 2020-11-17 NOTE — Assessment & Plan Note (Signed)
This is stable She does not need IV magnesium replacement therapy She will continue oral magnesium supplement

## 2020-11-17 NOTE — Assessment & Plan Note (Signed)
She is recovering well from recent chemotherapy Continue aggressive supportive care

## 2020-11-17 NOTE — Assessment & Plan Note (Signed)
This is improving since discontinuation of chemotherapy She does not need transfusion support

## 2020-11-23 ENCOUNTER — Ambulatory Visit: Payer: 59 | Admitting: Physical Therapy

## 2020-11-23 ENCOUNTER — Other Ambulatory Visit: Payer: Self-pay

## 2020-11-23 ENCOUNTER — Encounter: Payer: Self-pay | Admitting: Physical Therapy

## 2020-11-23 DIAGNOSIS — C541 Malignant neoplasm of endometrium: Secondary | ICD-10-CM

## 2020-11-23 DIAGNOSIS — R531 Weakness: Secondary | ICD-10-CM

## 2020-11-23 DIAGNOSIS — R209 Unspecified disturbances of skin sensation: Secondary | ICD-10-CM

## 2020-11-23 NOTE — Patient Instructions (Signed)
Access Code: [email or web address] URL: [email or web address] Date: 11/23/2020 Prepared by: Manus Gunning  Exercises Supine Bridge - 1 x daily - 7 x weekly - 1 sets - 10 reps - 3 sec hold Supine Quad Set - 1 x daily - 7 x weekly - 1 sets - 10 reps - 3 sec hold Supine Quad Set (Mirrored) - 1 x daily - 7 x weekly - 1 sets - 10 reps - 3 sec hold Supine Heel Slide - 1 x daily - 7 x weekly - 1 sets - 10 reps Supine Heel Slide (Mirrored) - 1 x daily - 7 x weekly - 1 sets - 10 reps Supine Gluteal Sets - 1 x daily - 7 x weekly - 1 sets - 10 reps - 3 sec hold

## 2020-11-23 NOTE — Therapy (Signed)
[place], [place], [address] Phone: [phone]   Fax:  [phone]  Physical Therapy Treatment  Patient Details  Name: Amanda Lin MRN: [id number] Date of Birth: December 10, 1955 Referring Provider (PT): Dr. Alvy Bimler   Encounter Date: 11/23/2020   PT End of Session - 11/23/20 1027     Visit Number 2    Number of Visits 13    Date for PT Re-Evaluation 12/26/20    PT Start Time 1004    PT Stop Time 1054    PT Time Calculation (min) 50 min    Activity Tolerance Patient tolerated treatment well    Behavior During Therapy [facility] for tasks assessed/performed             Past Medical History:  Diagnosis Date   Diabetes mellitus without complication ([place])    Endometrial cancer ([place])    Hypertension     Past Surgical History:  Procedure Laterality Date   IR IMAGING GUIDED PORT INSERTION  06/01/2020   TUBAL LIGATION Bilateral     There were no vitals filed for this visit.   Subjective Assessment - 11/23/20 1005     Subjective Today is a good day for me. I am doing ok.    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  She had imaging done in March and at the end of June which shows the metastisis responding well to chemo and reducing in size.  She had a transfusion last week that has really helped her fatigue from Pancytopenia    Patient Stated Goals To get stronger in arms and legs, improve balance    Currently in Pain? No/denies    Pain Score 0-No pain                [facility] PT Assessment - 11/23/20 0001       Functional Tests   Functional tests Sit to Stand      Sit to Stand   Comments 30 sec sit to stand: 6 reps                           [facility] Adult PT Treatment/Exercise - 11/23/20 0001       Knee/Hip Exercises: Supine   Quad Sets Strengthening;Both;1 set;10  reps   3 sec holds   Heel Slides Strengthening;Both;1 set;10 reps    Bridges Strengthening;Both;1 set;10 reps   with 3 sec holds and recovery periods in between each rep   Other Supine Knee/Hip Exercises Glute sets with 3 sec holds x 10 reps                         PT Long Term Goals - 11/23/20 0001       PT LONG TERM GOAL #1   Title Pt will be independent in HEP to improve strength and balance    Time 6    Period Weeks      PT LONG TERM GOAL #2   Title Pt will perform TUG in 12 seconds or less for decreasing  risk of falls    Baseline 17.75    Time 6    Period Weeks    Status New      PT LONG TERM GOAL #3   Title Pt will be able to perofrm sit to stand without use of arms  Time 6    Period Weeks    Status New      PT LONG TERM GOAL #4   Title Pt  will have improved hip strength by 1 full MMT or greater for improved gait    Time 6    Period Weeks    Status New      PT LONG TERM GOAL #5   Title pt will be able to walk continuously on level surface for 8-10 minutes without increased fatigue    Baseline 5    Time 6    Period Weeks    Status New      Additional Long Term Goals   Additional Long Term Goals Yes      PT LONG TERM GOAL #6   Title Pt to be able to complete 15 sit to stands in 30 seconds which is average for her age to allow her to return to prior functional level.    Baseline 6 reps    Time 4    Period Weeks    Status New    Target Date 12/21/20                   Plan - 11/23/20 1028     Clinical Impression Statement Began instructing pt in supine LE strengthening exercises today and issued these as part of pt's home exercise program. Pt required recovery periods in between each rep of all exercises due to muscle fatigue. Pt reports she became very weak throughout chemo and now has to use a cane which she previously did not. She also reports increased difficulty with exercises on the LLE due to knee arthritis.    PT Frequency  2x / week    PT Duration 6 weeks    PT Treatment/Interventions ADLs/Self Care Home Management;Gait training;Therapeutic exercise;Balance training;Neuromuscular re-education;Patient/family education;Energy conservation    PT Next Visit Plan see how HEP went, progress strength endurance, balance, core particularly for LE's, hips very weak esp flexors and abd, left greater than right.(no bone mets noted)    PT Home Exercise Plan Access Code: [email or web address]    Consulted and Agree with Plan of Care Patient             Patient will benefit from skilled therapeutic intervention in order to improve the following deficits and impairments:  Abnormal gait, Decreased activity tolerance, Decreased balance, Decreased knowledge of precautions, Decreased endurance, Decreased mobility, Difficulty walking, Decreased strength, Impaired sensation, Postural dysfunction  Visit Diagnosis: Weakness generalized  Disturbance of skin sensation  Endometrial cancer [facility])     Problem List Patient Active Problem List   Diagnosis Date Noted   Loose stools 09/22/2020   Generalized weakness 09/22/2020   Poor dentition 08/16/2020   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   Pancytopenia, acquired ([place]) 07/08/2020   Hypomagnesemia 07/08/2020   Hypokalemia, inadequate intake 07/08/2020   Weight loss 06/24/2020   Nausea with vomiting 06/07/2020   Metastasis to lymph nodes ([place]) 05/31/2020   Metastasis to lung ([place]) 05/31/2020   Goals of care, counseling/discussion 05/31/2020   Other constipation 05/20/2020   Deficiency anemia 05/12/2020   Diabetes mellitus ([place]) 05/12/2020   Endometrial carcinoma ([place]) 04/25/2020   Dysuria 04/20/2020   Postmenopausal bleeding 04/19/2020   Pap smear for cervical cancer screening 04/19/2020   Screening mammogram for breast cancer 04/19/2020   Elevated blood pressure reading 04/19/2020   Essential hypertension 04/19/2020   Right renal mass 02/26/2020   Kidney  stone 02/26/2020   Hematuria  02/26/2020    Allyson Sabal [facility] 11/23/2020, 11:00 AM  [place], [place], [address] Phone: [phone]   Fax:  [phone]  Name: Amanda Lin MRN: [id number] Date of Birth: June 18, 1955   Manus Gunning, PT 11/23/20 11:01 AM

## 2020-11-24 ENCOUNTER — Telehealth: Payer: Self-pay

## 2020-11-24 NOTE — Telephone Encounter (Signed)
Re faxed dental clearance form to [phone]. Received confirmation.

## 2020-12-02 ENCOUNTER — Ambulatory Visit: Payer: 59 | Admitting: Physical Therapy

## 2020-12-05 ENCOUNTER — Telehealth: Payer: Self-pay

## 2020-12-05 NOTE — Telephone Encounter (Signed)
Returned her call. 2 people that she lives with tested positive for covid. She will test herself and call the office back with results.

## 2020-12-06 ENCOUNTER — Other Ambulatory Visit: Payer: Self-pay | Admitting: Hematology and Oncology

## 2020-12-06 ENCOUNTER — Telehealth: Payer: Self-pay | Admitting: *Deleted

## 2020-12-06 DIAGNOSIS — C778 Secondary and unspecified malignant neoplasm of lymph nodes of multiple regions: Secondary | ICD-10-CM

## 2020-12-06 DIAGNOSIS — C541 Malignant neoplasm of endometrium: Secondary | ICD-10-CM

## 2020-12-06 DIAGNOSIS — C7800 Secondary malignant neoplasm of unspecified lung: Secondary | ICD-10-CM

## 2020-12-06 NOTE — Telephone Encounter (Addendum)
Patient called office. Her covid test is positive.  She said she needs to cancel and reschedule appts on 8/4 for labs/port flush/MD appt/treatment. Informed patient that she will be contacted by office regarding how and when appts will be rescheduled.  Call information routed to Dr. Alvy Bimler and RN support

## 2020-12-07 ENCOUNTER — Other Ambulatory Visit: Payer: Self-pay | Admitting: Hematology and Oncology

## 2020-12-07 ENCOUNTER — Telehealth: Payer: Self-pay | Admitting: Hematology and Oncology

## 2020-12-07 NOTE — Telephone Encounter (Signed)
R/s appts per 8/3 sch msg. Called pt, no answer. Left msg with appts date and times.

## 2020-12-07 NOTE — Telephone Encounter (Signed)
Hi Amanda Lin,  Can you help schedule everything for 8/23? Thanks

## 2020-12-07 NOTE — Telephone Encounter (Signed)
Called back and canceled appts for tomorrow. Scheduling message sent to reschedule appts to 8/23. Kellee verbalized understanding and will call the office back if needed.

## 2020-12-08 ENCOUNTER — Inpatient Hospital Stay: Payer: 59

## 2020-12-08 ENCOUNTER — Inpatient Hospital Stay: Payer: 59 | Admitting: Nutrition

## 2020-12-08 ENCOUNTER — Inpatient Hospital Stay: Payer: 59 | Admitting: Hematology and Oncology

## 2020-12-09 ENCOUNTER — Encounter: Payer: 59 | Admitting: Physical Therapy

## 2020-12-14 ENCOUNTER — Other Ambulatory Visit: Payer: Self-pay | Admitting: Hematology and Oncology

## 2020-12-15 ENCOUNTER — Other Ambulatory Visit: Payer: Self-pay | Admitting: Hematology and Oncology

## 2020-12-19 ENCOUNTER — Ambulatory Visit: Payer: 59 | Attending: Hematology and Oncology

## 2020-12-19 ENCOUNTER — Other Ambulatory Visit: Payer: Self-pay

## 2020-12-19 DIAGNOSIS — C541 Malignant neoplasm of endometrium: Secondary | ICD-10-CM | POA: Insufficient documentation

## 2020-12-19 DIAGNOSIS — R209 Unspecified disturbances of skin sensation: Secondary | ICD-10-CM | POA: Diagnosis present

## 2020-12-19 DIAGNOSIS — R531 Weakness: Secondary | ICD-10-CM | POA: Insufficient documentation

## 2020-12-19 NOTE — Therapy (Signed)
[place], [place], [address] Phone: [phone]   Fax:  [phone]  Physical Therapy Treatment  Patient Details  Name: Amanda Lin MRN: [id number] Date of Birth: 01/03/1956 Referring Provider (PT): Dr. Alvy Bimler   Encounter Date: 12/19/2020   PT End of Session - 12/19/20 0943     Visit Number 3    Number of Visits 13    Date for PT Re-Evaluation 12/26/20    PT Start Time 0842    PT Stop Time 0935    PT Time Calculation (min) 53 min    Activity Tolerance Patient tolerated treatment well    Behavior During Therapy [facility] for tasks assessed/performed             Past Medical History:  Diagnosis Date   Diabetes mellitus without complication ([place])    Endometrial cancer ([place])    Hypertension     Past Surgical History:  Procedure Laterality Date   IR IMAGING GUIDED PORT INSERTION  06/01/2020   TUBAL LIGATION Bilateral     There were no vitals filed for this visit.   Subjective Assessment - 12/19/20 0843     Subjective My husband, son and myself all had covid, but they had it worse than me.  Then I was constipated.  I haven't had time to do any exercises. I am hoping I can start doing it this week. Did fine the day I was here other than fatigue.    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  She had imaging done in March and at the end of June which shows the metastisis responding well to chemo and reducing in size.  She had a transfusion last week that has really helped her fatigue from Pancytopenia    Limitations Walking;Standing;Lifting;House hold activities    How long can you sit comfortably? no limitations    How long can you stand comfortably? 10-15 min    How long can you walk comfortably? household distances 5 min then has to sit down.    Patient Stated Goals To get stronger in  arms and legs, improve balance    Currently in Pain? No/denies    Pain Score 0-No pain    Multiple Pain Sites No                               [facility] Adult PT Treatment/Exercise - 12/19/20 0001       Knee/Hip Exercises: Standing   Heel Raises 2 sets;10 reps    Hip Flexion Right;Left;2 sets;5 reps    Hip Abduction Stengthening;Right;Left;1 set;10 reps      Knee/Hip Exercises: Seated   Long Arc Quad Strengthening;1 set;10 reps    Marching 2 sets;5 reps   alternating legs   Hamstring Curl Strengthening;Right;Left;1 set;10 reps   yellow   Abduction/Adduction  Strengthening;Both;2 sets;5 sets   adductor squeeze with bolster   Abd/Adduction Limitations 10 reps with yellow band    Sit to Sand 2 sets;5 reps   sightly higher table, no hands     Knee/Hip Exercises: Supine   Bridges Strengthening;Both;2 sets;5 sets   with 3 sec holds and recovery periods in between each rep                   PT Education - 12/19/20 [place]     Education Details Pt  was educated in standing heel raises, hip abd in standing, sit to stand, hip adductor squeeze, seated hip abd with TB, and LAQ to add to HEP.    Person(s) Educated Patient    Methods Handout;Explanation    Comprehension Verbalized understanding;Returned demonstration                 PT Long Term Goals - 11/23/20 0001       PT LONG TERM GOAL #1   Title Pt will be independent in HEP to improve strength and balance    Time 6    Period Weeks      PT LONG TERM GOAL #2   Title Pt will perform TUG in 12 seconds or less for decreasing  risk of falls    Baseline 17.75    Time 6    Period Weeks    Status New      PT LONG TERM GOAL #3   Title Pt will be able to perofrm sit to stand without use of arms    Time 6    Period Weeks    Status New      PT LONG TERM GOAL #4   Title Pt  will have improved hip strength by 1 full MMT or greater for improved gait    Time 6    Period Weeks    Status New      PT  LONG TERM GOAL #5   Title pt will be able to walk continuously on level surface for 8-10 minutes without increased fatigue    Baseline 5    Time 6    Period Weeks    Status New      Additional Long Term Goals   Additional Long Term Goals Yes      PT LONG TERM GOAL #6   Title Pt to be able to complete 15 sit to stands in 30 seconds which is average for her age to allow her to return to prior functional level.    Baseline 6 reps    Time 4    Period Weeks    Status New    Target Date 12/21/20                   Plan - 12/19/20 0943     Clinical Impression Statement Pt has missed over 3 weeks of therapy secondary to Covid and other sickness. We continued pt strengthening in sitting, supine and standing.  Pt. had better endurance today and although she required rest did not need to rest between each repetition except with bridging which is still difficult for her.  She felt some fatigue after exercises but reported feeling very good overall.    Personal Factors and Comorbidities Comorbidity 3+    Comorbidities ,endometrial Cancer with Mets, diabetes, pancytopoenia, knee OA    Examination-Activity Limitations Stairs;Squat;Transfers;Stand;Locomotion Level    Stability/Clinical Decision Making Stable/Uncomplicated    Clinical Decision Making Low    Rehab Potential Good    PT Frequency 2x / week    PT Duration 6 weeks    PT Treatment/Interventions ADLs/Self Care Home Management;Gait training;Therapeutic exercise;Balance training;Neuromuscular re-education;Patient/family education;Energy conservation    PT Next Visit Plan see how HEP went, progress strength endurance, balance, core particularly for LE's, hips very weak esp flexors and abd, left greater than right.(no bone mets noted)    PT Home Exercise Plan Access Code: [email or web address], also LAQ, standing heel raises, sit to stand, standing hip abd, hip adductor squeeze in sitting,  hip abd with tB in sitting    Consulted and Agree with Plan  of Care Patient             Patient will benefit from skilled therapeutic intervention in order to improve the following deficits and impairments:  Abnormal gait, Decreased activity tolerance, Decreased balance, Decreased knowledge of precautions, Decreased endurance, Decreased mobility, Difficulty walking, Decreased strength, Impaired sensation, Postural dysfunction  Visit Diagnosis: Weakness generalized  Disturbance of skin sensation  Endometrial cancer [facility])     Problem List Patient Active Problem List   Diagnosis Date Noted   Loose stools 09/22/2020   Generalized weakness 09/22/2020   Poor dentition 08/16/2020   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   Pancytopenia, acquired ([place]) 07/08/2020   Hypomagnesemia 07/08/2020   Hypokalemia, inadequate intake 07/08/2020   Weight loss 06/24/2020   Nausea with vomiting 06/07/2020   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/31/2020   Metastasis to lung ([place]) 05/31/2020   Goals of care, counseling/discussion 05/31/2020   Other constipation 05/20/2020   Deficiency anemia 05/12/2020   Diabetes mellitus ([place]) 05/12/2020   Endometrial carcinoma ([place]) 04/25/2020   Dysuria 04/20/2020   Postmenopausal bleeding 04/19/2020   Pap smear for cervical cancer screening 04/19/2020   Screening mammogram for breast cancer 04/19/2020   Elevated blood pressure reading 04/19/2020   Essential hypertension 04/19/2020   Right renal mass 02/26/2020   Kidney stone 02/26/2020   Hematuria 02/26/2020    Claris Pong 12/19/2020, 9:48 AM  [place], [place], [address] Phone: [phone]   Fax:  [phone]  Name: EILLEY LEBRETON MRN: [id number] Date of Birth: [birth year] Cheral Almas, PT 12/19/20 9:49 AM

## 2020-12-19 NOTE — Patient Instructions (Signed)
Access Code: [email or web address] URL: [email or web address] Date: 12/19/2020 Prepared by: Cheral Almas  Exercises Standing Heel Raise with Support - 1 x daily - 7 x weekly - 1 sets - 10 reps Sit to Stand - 1 x daily - 7 x weekly - 1 sets - 10 reps Seated Long Arc Quad - 1 x daily - 3 x weekly - 1 sets - 10 reps Standing Hip Abduction with Counter Support - 1 x daily - 3 x weekly - 1 sets - 10 reps Seated Hip Adduction Isometrics with Ball - 1 x daily - 3 x weekly - 1 sets - 10 reps - 3-5 hold Seated Hip Abduction with Resistance - 1 x daily - 3 x weekly - 1 sets - 10 reps

## 2020-12-22 ENCOUNTER — Encounter: Payer: Self-pay | Admitting: Hematology and Oncology

## 2020-12-22 ENCOUNTER — Telehealth: Payer: Self-pay

## 2020-12-22 ENCOUNTER — Ambulatory Visit (HOSPITAL_COMMUNITY)
Admission: RE | Admit: 2020-12-22 | Discharge: 2020-12-22 | Disposition: A | Discharge status: Home / Self Care | Payer: 59 | Source: Ambulatory Visit | Attending: Hematology and Oncology | Admitting: Hematology and Oncology

## 2020-12-22 ENCOUNTER — Other Ambulatory Visit: Payer: Self-pay | Admitting: Hematology and Oncology

## 2020-12-22 ENCOUNTER — Inpatient Hospital Stay: Payer: 59 | Admitting: Hematology and Oncology

## 2020-12-22 ENCOUNTER — Ambulatory Visit: Payer: 59

## 2020-12-22 ENCOUNTER — Other Ambulatory Visit: Payer: Self-pay

## 2020-12-22 ENCOUNTER — Inpatient Hospital Stay: Payer: 59 | Attending: Gynecologic Oncology

## 2020-12-22 DIAGNOSIS — E860 Dehydration: Secondary | ICD-10-CM | POA: Diagnosis not present

## 2020-12-22 DIAGNOSIS — K5900 Constipation, unspecified: Secondary | ICD-10-CM | POA: Insufficient documentation

## 2020-12-22 DIAGNOSIS — N179 Acute kidney failure, unspecified: Secondary | ICD-10-CM | POA: Insufficient documentation

## 2020-12-22 DIAGNOSIS — E876 Hypokalemia: Secondary | ICD-10-CM | POA: Diagnosis not present

## 2020-12-22 DIAGNOSIS — K219 Gastro-esophageal reflux disease without esophagitis: Secondary | ICD-10-CM | POA: Insufficient documentation

## 2020-12-22 DIAGNOSIS — I129 Hypertensive chronic kidney disease with stage 1 through stage 4 chronic kidney disease, or unspecified chronic kidney disease: Secondary | ICD-10-CM | POA: Insufficient documentation

## 2020-12-22 DIAGNOSIS — R5383 Other fatigue: Secondary | ICD-10-CM | POA: Diagnosis not present

## 2020-12-22 DIAGNOSIS — K5909 Other constipation: Secondary | ICD-10-CM | POA: Diagnosis not present

## 2020-12-22 DIAGNOSIS — G893 Neoplasm related pain (acute) (chronic): Secondary | ICD-10-CM | POA: Insufficient documentation

## 2020-12-22 DIAGNOSIS — R531 Weakness: Secondary | ICD-10-CM | POA: Diagnosis not present

## 2020-12-22 DIAGNOSIS — C7800 Secondary malignant neoplasm of unspecified lung: Secondary | ICD-10-CM | POA: Insufficient documentation

## 2020-12-22 DIAGNOSIS — R000 Tachycardia, unspecified: Secondary | ICD-10-CM | POA: Insufficient documentation

## 2020-12-22 DIAGNOSIS — Z79899 Other long term (current) drug therapy: Secondary | ICD-10-CM | POA: Insufficient documentation

## 2020-12-22 DIAGNOSIS — R112 Nausea with vomiting, unspecified: Secondary | ICD-10-CM | POA: Diagnosis present

## 2020-12-22 DIAGNOSIS — C541 Malignant neoplasm of endometrium: Secondary | ICD-10-CM

## 2020-12-22 DIAGNOSIS — R0602 Shortness of breath: Secondary | ICD-10-CM | POA: Diagnosis not present

## 2020-12-22 DIAGNOSIS — D539 Nutritional anemia, unspecified: Secondary | ICD-10-CM

## 2020-12-22 DIAGNOSIS — Z8616 Personal history of COVID-19: Secondary | ICD-10-CM | POA: Diagnosis not present

## 2020-12-22 DIAGNOSIS — N189 Chronic kidney disease, unspecified: Secondary | ICD-10-CM | POA: Diagnosis not present

## 2020-12-22 DIAGNOSIS — I10 Essential (primary) hypertension: Secondary | ICD-10-CM

## 2020-12-22 LAB — COMPREHENSIVE METABOLIC PANEL
ALT: 6 U/L (ref 0–44)
AST: 11 U/L — ABNORMAL LOW (ref 15–41)
Albumin: 3.3 g/dL — ABNORMAL LOW (ref 3.5–5.0)
Alkaline Phosphatase: 73 U/L (ref 38–126)
Anion gap: 14 (ref 5–15)
BUN: 18 mg/dL (ref 8–23)
CO2: 26 mmol/L (ref 22–32)
Calcium: 9.6 mg/dL (ref 8.9–10.3)
Chloride: 103 mmol/L (ref 98–111)
Creatinine, Ser: 1.14 mg/dL — ABNORMAL HIGH (ref 0.44–1.00)
GFR, Estimated: 54 mL/min — ABNORMAL LOW (ref >60)
Glucose, Bld: 108 mg/dL — ABNORMAL HIGH (ref 70–99)
Potassium: 3.3 mmol/L — ABNORMAL LOW (ref 3.5–5.1)
Sodium: 143 mmol/L (ref 135–145)
Total Bilirubin: 0.6 mg/dL (ref 0.3–1.2)
Total Protein: 7.1 g/dL (ref 6.5–8.1)

## 2020-12-22 LAB — CBC WITH DIFFERENTIAL/PLATELET
Abs Immature Granulocytes: 0.03 10*3/uL (ref 0.00–0.07)
Basophils Absolute: 0 10*3/uL (ref 0.0–0.1)
Basophils Relative: 0 %
Eosinophils Absolute: 0 10*3/uL (ref 0.0–0.5)
Eosinophils Relative: 0 %
HCT: 36.1 % (ref 36.0–46.0)
Hemoglobin: 11.5 g/dL — ABNORMAL LOW (ref 12.0–15.0)
Immature Granulocytes: 0 %
Lymphocytes Relative: 10 %
Lymphs Abs: 0.8 10*3/uL (ref 0.7–4.0)
MCH: 28.9 pg (ref 26.0–34.0)
MCHC: 31.9 g/dL (ref 30.0–36.0)
MCV: 90.7 fL (ref 80.0–100.0)
Monocytes Absolute: 0.7 10*3/uL (ref 0.1–1.0)
Monocytes Relative: 8 %
Neutro Abs: 6.5 10*3/uL (ref 1.7–7.7)
Neutrophils Relative %: 82 %
Platelets: 360 10*3/uL (ref 150–400)
RBC: 3.98 MIL/uL (ref 3.87–5.11)
RDW: 15.9 % — ABNORMAL HIGH (ref 11.5–15.5)
WBC: 8.1 10*3/uL (ref 4.0–10.5)
nRBC: 0 % (ref 0.0–0.2)

## 2020-12-22 LAB — URINALYSIS, COMPLETE (UACMP) WITH MICROSCOPIC
Bilirubin Urine: NEGATIVE
Glucose, UA: NEGATIVE mg/dL
Hgb urine dipstick: NEGATIVE
Ketones, ur: 5 mg/dL — AB
Nitrite: NEGATIVE
Protein, ur: 30 mg/dL — AB
Specific Gravity, Urine: 1.019 (ref 1.005–1.030)
pH: 5 (ref 5.0–8.0)

## 2020-12-22 LAB — MAGNESIUM: Magnesium: 1.7 mg/dL (ref 1.7–2.4)

## 2020-12-22 NOTE — Assessment & Plan Note (Signed)
This is likely triggered by recent poor oral intake and severe constipation

## 2020-12-22 NOTE — Progress Notes (Signed)
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[facility]) Her examination reveals some mild tenderness which I suspect is caused by recent severe constipation Her blood work and imaging study were reviewed and they were close to normal range Continue supportive care I will see her again next week for further follow-up  Deficiency anemia Her recent severe pancytopenia has improved and she is only mildly anemic I do not believe this is the cause of her fatigue or shortness of breath  Hypokalemia, inadequate intake This is likely due to poor oral intake We discussed importance of regular diet I gave her a sheet to track her oral intake and we will review with her next week  Other constipation She had recent severe constipation The patient is reluctant to take laxatives because of history of diarrhea We have extensive discussion about the importance of taking regular laxatives I suspect her abdominal pain was caused by recent severe constipation I reviewed her x-ray; even though the radiologist say is within normal range, I see some evidence of constipation   Nausea with vomiting This is likely triggered by recent poor oral intake and severe constipation  Essential hypertension Her blood pressure is grossly elevated I gave her a sheet to track her blood pressure for the next few days  No orders of the defined types were placed in this encounter.   All questions were answered. The patient knows to call the clinic with any problems, questions or concerns. The total time spent in the appointment was 30 minutes encounter with patients including review of chart and various tests results, discussions about plan of care and coordination of care plan   [name], MD 12/22/2020 1:05 PM  INTERVAL  HISTORY: Please see below for problem oriented charting. 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     02/26/2020 Imaging   1. 3 mm nonobstructing renal stone within the left kidney. 2. Findings suggestive of a small hemorrhagic cyst within the lower pole of the right kidney, with an anterior right lower pole heterogeneous renal soft tissue mass. MRI correlation is recommended, as an underlying neoplastic process cannot be excluded. 3. Colonic diverticulosis. 4. Multiple exophytic uterine fibroids. 5. Evidence of prior cholecystectomy.   03/28/2020 Imaging   2.4 cm enhancing mass in the anterior right lower kidney, suspicious for solid renal neoplasm such as renal cell carcinoma.   Single right renal artery and vein.  No renal vein invasion.   Small retroperitoneal nodes, measuring up to 9 mm short axis, mildly prominent but technically within the upper limits of normal. Attention on follow-up is suggested.   4 x 5 mm nodule in the  medial right lower lobe. This is nonspecific and unlikely to reflect a metastasis but warrants attention on follow-up. Consider dedicated CT chest in 3-6 months.   3 mm nonobstructing left lower pole renal calculus. No hydronephrosis.     04/04/2020 Imaging    1. 10 mm endometrial stripe thickness. This is considered abnormal in a postmenopausal female with bleeding. In the setting of post-menopausal bleeding, endometrial sampling is indicated to exclude carcinoma.  2. Multiple uterine fibroids including potential submucosal fibroid in the lower uterine segment.   04/19/2020 Pathology Results   A. ENDOMETRIUM, BIOPSY:  -  High-grade carcinoma  Immunohistochemical and morphometric analysis performed manually   The tumor cells are POSITIVE for Her2 (3+).    04/19/2020 Procedure   She was seen by Dr. Astrid Drafts on 04/19/2020.  At that visit her cervix was noted to be nodular and firm and abnormal appearing.  Pap taken at that visit was positive for adenocarcinoma, endometrial.  An endometrial [place] biopsy was taken at the same visit which revealed high-grade endometrial cancer, suspicious for serous carcinoma.     05/12/2020 Cancer Staging   Staging form: Corpus Uteri - Carcinoma and Carcinosarcoma, AJCC 8th Edition - Clinical stage from 05/12/2020: FIGO Stage IVB (cT3, cN2, cM1) - Signed by [name], MD on 05/31/2020   05/30/2020 PET scan   1. Hypermetabolic lymph nodes identified in the left supraclavicular region/thoracic inlet, mediastinum, hilar regions, right retrocrural space, para-aortic retroperitoneum, and bilateral pelvic sidewall, compatible with metastatic disease. 2. Moderate volume ascites on this noncontrast CT study largely obscures peritoneal and omental disease although hypermetabolic plaque-like uptake in the anterior high abdomen is probably peritoneal with relatively bulky hypermetabolism in the omentum. These findings are also compatible with metastatic disease. 3. Hypermetabolic left upper lobe pulmonary nodule consistent with metastatic disease. Lung primary is considered less likely but not excluded. 4. Scattered areas of focal marrow uptake are suspicious for bony metastases although no underlying abnormality is evident on CT  imaging. 5. Diffuse uptake in the uterine anatomy compatible with the patient's known history of endometrial carcinoma. 6. As above, moderate volume ascites is new in the interval. 7. Enhancing lesion lower pole right kidney seen on previous CT of 03/28/2020 is less conspicuous on today's noncontrast CT imaging. Diffuse normal background FDG accumulation could obscure hypermetabolism in this lesion previously characterized as suspicious for renal cell carcinoma. 8. Bibasilar collapse/consolidation in the lower lobes.   06/01/2020 Procedure   Ultrasound and fluoroscopically guided right internal jugular single lumen power port catheter insertion. Tip in the SVC/RA junction. Catheter ready for use.   06/02/2020 Echocardiogram    1. GLS -18.2%.  2. Left ventricular ejection fraction, by estimation, is 60 to 65%. The left ventricle has normal function. The left ventricle has no regional wall motion abnormalities. There is mild left ventricular hypertrophy. Left ventricular diastolic parameters are consistent with Grade I diastolic dysfunction (impaired relaxation).  3. Right ventricular systolic function is normal. The right ventricular size is normal.  4. The mitral valve is normal in structure. No evidence of mitral valve regurgitation. No evidence of mitral stenosis.  5. The aortic valve is tricuspid. Aortic valve regurgitation is not visualized. No aortic stenosis is present.  6. The inferior vena cava is normal in size with greater than 50% respiratory variability, suggesting right atrial pressure of 3 mmHg.     06/03/2020 -  Chemotherapy    Patient is on Treatment Plan: UTERINE SEROUS CARCINOMA CARBOPLATIN + PACLITAXEL + TRASTUZUMAB Q21D X 6  CYCLES / TRASTUZUMAB Q21D       07/25/2020 Imaging   1. Interval response to therapy. There has been interval decrease in size of mediastinal, retrocrural, retroperitoneal, and left pelvic sidewall lymph nodes. 2. Decreased volume of ascites. Interval  decrease in thickness of omental cake secondary to peritoneal disease. 3. Decrease in size of FDG avid tumor within the umbilicus. 4. Decrease in size of left upper lobe lung nodule. 5. Persistent left-sided hydronephrosis and hydroureter. No obstructing stone identified. 6. Previously noted solid enhancing lesion arising off the anterior cortex of the inferior pole of the right kidney is again noted and remains worrisome for renal cell carcinoma. 7. Aortic atherosclerosis.     08/08/2020 Echocardiogram    1. Left ventricular ejection fraction, by estimation, is 65 to 70%. The left ventricle has normal function. The left ventricle has no regional wall motion abnormalities. There is mild concentric left ventricular hypertrophy of the basal-septal segment. Left ventricular diastolic parameters are consistent with Grade I diastolic dysfunction (impaired relaxation). The average left ventricular global longitudinal strain is -18.5 %. The global longitudinal strain is normal.  2. Right ventricular systolic function is normal. The right ventricular size is normal.  3. The mitral valve is normal in structure. No evidence of mitral valve regurgitation.  4. The aortic valve is tricuspid. Aortic valve regurgitation is not visualized. No aortic stenosis is present.  5. The inferior vena cava is normal in size with greater than 50% respiratory variability, suggesting right atrial pressure of 3 mmHg.   Comparison(s): A prior study was performed on 06/02/2020. No significant change from prior study. Prior images reviewed side by side. Similar LV deformation and strain and improved loading conditions (improved blood pressure).   11/03/2020 Imaging   1. Overall no significant interval change in exam. 2. Signs of peritoneal carcinomatosis are again noted with perihepatic ascites and omental caking. Not significantly changed in the interval. 3. Slight decrease in size left supraclavicular and mediastinal lymph nodes.  Stable borderline retroperitoneal and left pelvic lymph nodes. 4. Small pulmonary nodules are stable. 5. Stable appearance of solid enhancing lesion arising off the anterior cortex of the inferior pole of right kidney compatible with renal cell carcinoma. 6. Stable appearance of mild left hydronephrosis and left hydroureter to just before the urinary bladder. No obstructing stone identified. 7. Aortic atherosclerosis.   11/16/2020 Echocardiogram    1. Left ventricular ejection fraction, by estimation, is 55 to 60%. The left ventricle has normal function. The left ventricle has no regional wall motion abnormalities. There is moderate left ventricular hypertrophy. Left ventricular diastolic parameters are consistent with Grade I diastolic dysfunction (impaired relaxation). The average left ventricular global longitudinal strain is -18.0 %. The global longitudinal strain is normal.  2. Right ventricular systolic function is normal. The right ventricular size is normal. Tricuspid regurgitation signal is inadequate for assessing PA pressure.  3. The mitral valve is normal in structure. No evidence of mitral valve regurgitation. No evidence of mitral stenosis.  4. The aortic valve is tricuspid. Aortic valve regurgitation is not visualized. Mild aortic valve sclerosis is present, with no evidence of aortic valve stenosis.  5. The inferior vena cava is normal in size with greater than 50% respiratory variability, suggesting right atrial pressure of 3 mmHg.     Metastasis to lymph nodes ([place])  05/31/2020 Initial Diagnosis   Metastasis to lymph nodes ([place])   06/03/2020 -  Chemotherapy    Patient is on Treatment Plan: UTERINE SEROUS CARCINOMA CARBOPLATIN +  PACLITAXEL + TRASTUZUMAB Q21D X 6 CYCLES / TRASTUZUMAB Q21D       Metastasis to lung ([place])  05/31/2020 Initial Diagnosis   Metastasis to lung ([place])   06/03/2020 -  Chemotherapy    Patient is on Treatment Plan: UTERINE SEROUS CARCINOMA CARBOPLATIN +  PACLITAXEL + TRASTUZUMAB Q21D X 6 CYCLES / TRASTUZUMAB Q21D         REVIEW OF SYSTEMS:   Constitutional: Denies fevers, chills or abnormal weight loss Eyes: Denies blurriness of vision Ears, nose, mouth, throat, and face: Denies mucositis or sore throat Respiratory: Denies cough, dyspnea or wheezes Cardiovascular: Denies palpitation, chest discomfort or lower extremity swelling Skin: Denies abnormal skin rashes Lymphatics: Denies new lymphadenopathy or easy bruising Neurological:Denies numbness, tingling or new weaknesses Behavioral/Psych: Mood is stable, no new changes  All other systems were reviewed with the patient and are negative.  I have reviewed the past medical history, past surgical history, social history and family history with the patient and they are unchanged from previous note.  ALLERGIES:  has No Known Allergies.  MEDICATIONS:  Current Outpatient Medications  Medication Sig Dispense Refill   amLODipine (NORVASC) 10 MG tablet TAKE 1 TABLET(10 MG) BY MOUTH DAILY 30 tablet 1   lidocaine-prilocaine (EMLA) cream Apply to affected area once 30 g 3   losartan (COZAAR) 100 MG tablet Take 1 tablet (100 mg total) by mouth daily. 90 tablet 1   magnesium oxide (MAG-OX) 400 (240 Mg) MG tablet Take 1 tablet (400 mg total) by mouth daily. 30 tablet 1   ondansetron (ZOFRAN) 8 MG tablet Take 1 tablet (8 mg total) by mouth every 8 (eight) hours as needed for refractory nausea / vomiting. Start on day 3 after carboplatin chemo. 30 tablet 1   oxyCODONE (OXY IR/ROXICODONE) 5 MG immediate release tablet Take 1 tablet (5 mg total) by mouth every 4 (four) hours as needed for severe pain. 60 tablet 0   polyethylene glycol (MIRALAX / GLYCOLAX) 17 g packet Take 17 g by mouth daily.     prochlorperazine (COMPAZINE) 10 MG tablet TAKE 1 TABLET(10 MG) BY MOUTH EVERY 6 HOURS AS NEEDED FOR NAUSEA OR VOMITING 90 tablet 1   spironolactone (ALDACTONE) 50 MG tablet Take 1 tablet (50 mg total) by mouth  daily. 30 tablet 0   No current facility-administered medications for this visit.    PHYSICAL EXAMINATION: ECOG PERFORMANCE STATUS: 1 - Symptomatic but completely ambulatory  Vitals:   12/22/20 1234  BP: (!) 199/110  Pulse: (!) 105  Resp: 18  Temp: (!) 97.4 F (36.3 C)  SpO2: 100%   Filed Weights   12/22/20 1234  Weight: 180 lb 3.2 oz (81.7 kg)    GENERAL:alert, no distress and comfortable SKIN: skin color, texture, turgor are normal, no rashes or significant lesions EYES: normal, Conjunctiva are pink and non-injected, sclera clear OROPHARYNX:no exudate, no erythema and lips, buccal mucosa, and tongue normal  NECK: supple, thyroid normal size, non-tender, without nodularity LYMPH:  no palpable lymphadenopathy in the cervical, axillary or inguinal LUNGS: clear to auscultation and percussion with normal breathing effort HEART: regular rate & rhythm and no murmurs and no lower extremity edema ABDOMEN:abdomen soft, mild tenderness on palpation without rebound or guarding.  Reduced bowel sounds  musculoskeletal:no cyanosis of digits and no clubbing  NEURO: alert & oriented x 3 with fluent speech, no focal motor/sensory deficits  LABORATORY DATA:  I have reviewed the data as listed    Component Value Date/Time   NA 143 12/22/2020  1219   NA 138 02/23/2020 1113   K 3.3 (L) 12/22/2020 1219   CL 103 12/22/2020 1219   CO2 26 12/22/2020 1219   GLUCOSE 108 (H) 12/22/2020 1219   BUN 18 12/22/2020 1219   BUN 15 02/23/2020 1113   CREATININE 1.14 (H) 12/22/2020 1219   CREATININE 0.77 11/17/2020 0932   CALCIUM 9.6 12/22/2020 1219   PROT 7.1 12/22/2020 1219   PROT 7.8 02/23/2020 1113   ALBUMIN 3.3 (L) 12/22/2020 1219   ALBUMIN 4.4 02/23/2020 1113   AST 11 (L) 12/22/2020 1219   AST 13 (L) 11/17/2020 0932   ALT 6 12/22/2020 1219   ALT 7 11/17/2020 0932   ALKPHOS 73 12/22/2020 1219   BILITOT 0.6 12/22/2020 1219   BILITOT 0.4 11/17/2020 0932   GFRNONAA 54 (L) 12/22/2020 1219    GFRNONAA >60 11/17/2020 0932   GFRAA 91 02/23/2020 1113    No results found for: SPEP, UPEP  Lab Results  Component Value Date   WBC 8.1 12/22/2020   NEUTROABS 6.5 12/22/2020   HGB 11.5 (L) 12/22/2020   HCT 36.1 12/22/2020   MCV 90.7 12/22/2020   PLT 360 12/22/2020      Chemistry      Component Value Date/Time   NA 143 12/22/2020 1219   NA 138 02/23/2020 1113   K 3.3 (L) 12/22/2020 1219   CL 103 12/22/2020 1219   CO2 26 12/22/2020 1219   BUN 18 12/22/2020 1219   BUN 15 02/23/2020 1113   CREATININE 1.14 (H) 12/22/2020 1219   CREATININE 0.77 11/17/2020 0932      Component Value Date/Time   CALCIUM 9.6 12/22/2020 1219   ALKPHOS 73 12/22/2020 1219   AST 11 (L) 12/22/2020 1219   AST 13 (L) 11/17/2020 0932   ALT 6 12/22/2020 1219   ALT 7 11/17/2020 0932   BILITOT 0.6 12/22/2020 1219   BILITOT 0.4 11/17/2020 0932       RADIOGRAPHIC STUDIES: I have personally reviewed the radiological images as listed and agreed with the findings in the report. DG Abd 2 Views  Result Date: 12/22/2020 CLINICAL DATA:  Right lower quadrant abdominal pain with nausea, vomiting, and constipation. History of endometrial cancer. EXAM: ABDOMEN - 2 VIEW COMPARISON:  None. FINDINGS: The bowel gas pattern is normal. There is no evidence of free air. No radio-opaque calculi or other significant radiographic abnormality is seen. Surgical clips in the right upper quadrant and pelvis. No acute osseous abnormality. IMPRESSION: Negative. Electronically Signed   By: Titus Dubin M.D.   On: 12/22/2020 12:03

## 2020-12-22 NOTE — Telephone Encounter (Signed)
Returned her call. She is crying and having right sided bad abdominal pain. She did not sleep last night due to the pain. Constipation last week, bm's over the weekend and yesterday. She vomited yesterday and Tuesday. She is taking laxatives per instructions,. Per Dr. Alvy Bimler instructed to go the radiology at 11 am for x-ray, then come to [facility] for lab and see Dr. Alvy Bimler today. Instructed to go to the ER for worsening symptoms. She verbalized understanding.

## 2020-12-22 NOTE — Assessment & Plan Note (Signed)
Her blood pressure is grossly elevated I gave her a sheet to track her blood pressure for the next few days

## 2020-12-22 NOTE — Assessment & Plan Note (Signed)
Her examination reveals some mild tenderness which I suspect is caused by recent severe constipation Her blood work and imaging study were reviewed and they were close to normal range Continue supportive care I will see her again next week for further follow-up

## 2020-12-22 NOTE — Assessment & Plan Note (Signed)
This is likely due to poor oral intake We discussed importance of regular diet I gave her a sheet to track her oral intake and we will review with her next week

## 2020-12-22 NOTE — Assessment & Plan Note (Signed)
Her recent severe pancytopenia has improved and she is only mildly anemic I do not believe this is the cause of her fatigue or shortness of breath

## 2020-12-22 NOTE — Assessment & Plan Note (Signed)
She had recent severe constipation The patient is reluctant to take laxatives because of history of diarrhea We have extensive discussion about the importance of taking regular laxatives I suspect her abdominal pain was caused by recent severe constipation I reviewed her x-ray; even though the radiologist say is within normal range, I see some evidence of constipation

## 2020-12-23 ENCOUNTER — Telehealth: Payer: Self-pay

## 2020-12-23 LAB — URINE CULTURE: Culture: NO GROWTH

## 2020-12-23 NOTE — Telephone Encounter (Signed)
Returned her call. Instructed per Dr. Alvy Bimler, drink more water and urine culture is negative. She verbalized understanding. She is taking her laxatives and had a couple of bm's today. Ask her to call the office for questions and concerns. She verbalized understanding.

## 2020-12-27 ENCOUNTER — Inpatient Hospital Stay: Payer: 59

## 2020-12-27 ENCOUNTER — Inpatient Hospital Stay (HOSPITAL_BASED_OUTPATIENT_CLINIC_OR_DEPARTMENT_OTHER): Payer: 59 | Admitting: Hematology and Oncology

## 2020-12-27 ENCOUNTER — Other Ambulatory Visit: Payer: Self-pay

## 2020-12-27 ENCOUNTER — Other Ambulatory Visit: Payer: Self-pay | Admitting: Hematology and Oncology

## 2020-12-27 ENCOUNTER — Ambulatory Visit: Payer: 59

## 2020-12-27 ENCOUNTER — Encounter: Payer: Self-pay | Admitting: Hematology and Oncology

## 2020-12-27 VITALS — BP 196/113 | HR 113 | Temp 97.4°F | Resp 18 | Ht 61.0 in | Wt 178.7 lb

## 2020-12-27 VITALS — BP 208/85 | HR 76 | Resp 18

## 2020-12-27 DIAGNOSIS — Z95828 Presence of other vascular implants and grafts: Secondary | ICD-10-CM

## 2020-12-27 DIAGNOSIS — C7800 Secondary malignant neoplasm of unspecified lung: Secondary | ICD-10-CM

## 2020-12-27 DIAGNOSIS — C541 Malignant neoplasm of endometrium: Secondary | ICD-10-CM

## 2020-12-27 DIAGNOSIS — C778 Secondary and unspecified malignant neoplasm of lymph nodes of multiple regions: Secondary | ICD-10-CM

## 2020-12-27 DIAGNOSIS — Z7189 Other specified counseling: Secondary | ICD-10-CM

## 2020-12-27 DIAGNOSIS — G893 Neoplasm related pain (acute) (chronic): Secondary | ICD-10-CM | POA: Diagnosis not present

## 2020-12-27 DIAGNOSIS — E876 Hypokalemia: Secondary | ICD-10-CM

## 2020-12-27 DIAGNOSIS — R112 Nausea with vomiting, unspecified: Secondary | ICD-10-CM

## 2020-12-27 DIAGNOSIS — R531 Weakness: Secondary | ICD-10-CM | POA: Diagnosis not present

## 2020-12-27 DIAGNOSIS — K5909 Other constipation: Secondary | ICD-10-CM

## 2020-12-27 LAB — CBC WITH DIFFERENTIAL (CANCER CENTER ONLY)
Abs Immature Granulocytes: 0.07 10*3/uL (ref 0.00–0.07)
Basophils Absolute: 0 10*3/uL (ref 0.0–0.1)
Basophils Relative: 0 %
Eosinophils Absolute: 0 10*3/uL (ref 0.0–0.5)
Eosinophils Relative: 0 %
HCT: 36.7 % (ref 36.0–46.0)
Hemoglobin: 12.2 g/dL (ref 12.0–15.0)
Immature Granulocytes: 1 %
Lymphocytes Relative: 8 %
Lymphs Abs: 0.7 10*3/uL (ref 0.7–4.0)
MCH: 29.3 pg (ref 26.0–34.0)
MCHC: 33.2 g/dL (ref 30.0–36.0)
MCV: 88.2 fL (ref 80.0–100.0)
Monocytes Absolute: 0.7 10*3/uL (ref 0.1–1.0)
Monocytes Relative: 7 %
Neutro Abs: 7.7 10*3/uL (ref 1.7–7.7)
Neutrophils Relative %: 84 %
Platelet Count: 363 10*3/uL (ref 150–400)
RBC: 4.16 MIL/uL (ref 3.87–5.11)
RDW: 15.2 % (ref 11.5–15.5)
WBC Count: 9.2 10*3/uL (ref 4.0–10.5)
nRBC: 0 % (ref 0.0–0.2)

## 2020-12-27 LAB — CMP (CANCER CENTER ONLY)
ALT: 10 U/L (ref 0–44)
AST: 18 U/L (ref 15–41)
Albumin: 3.3 g/dL — ABNORMAL LOW (ref 3.5–5.0)
Alkaline Phosphatase: 87 U/L (ref 38–126)
Anion gap: 14 (ref 5–15)
BUN: 30 mg/dL — ABNORMAL HIGH (ref 8–23)
CO2: 26 mmol/L (ref 22–32)
Calcium: 9.3 mg/dL (ref 8.9–10.3)
Chloride: 97 mmol/L — ABNORMAL LOW (ref 98–111)
Creatinine: 1.45 mg/dL — ABNORMAL HIGH (ref 0.44–1.00)
GFR, Estimated: 40 mL/min — ABNORMAL LOW (ref >60)
Glucose, Bld: 133 mg/dL — ABNORMAL HIGH (ref 70–99)
Potassium: 2.9 mmol/L — ABNORMAL LOW (ref 3.5–5.1)
Sodium: 137 mmol/L (ref 135–145)
Total Bilirubin: 0.5 mg/dL (ref 0.3–1.2)
Total Protein: 7.2 g/dL (ref 6.5–8.1)

## 2020-12-27 LAB — MAGNESIUM: Magnesium: 1.9 mg/dL (ref 1.7–2.4)

## 2020-12-27 MED ORDER — SODIUM CHLORIDE 0.9 % IV SOLN: Freq: Once | INTRAVENOUS | Status: AC

## 2020-12-27 MED ORDER — ONDANSETRON HCL 4 MG/2ML IJ SOLN
4.0000 mg | Freq: Once | INTRAMUSCULAR | Status: AC
Start: 2020-12-27 — End: 2020-12-27
  Administered 2020-12-27: 4 mg via INTRAVENOUS
  Filled 2020-12-27: qty 2

## 2020-12-27 MED ORDER — SODIUM CHLORIDE 0.9 % IV SOLN: Freq: Once | INTRAVENOUS | Status: DC

## 2020-12-27 MED ORDER — POTASSIUM CHLORIDE 10 MEQ/100ML IV SOLN
10.0000 meq | INTRAVENOUS | Status: AC
  Administered 2020-12-27 (×2): 10 meq via INTRAVENOUS
  Filled 2020-12-27 (×2): qty 100

## 2020-12-27 MED ORDER — SODIUM CHLORIDE 0.9% FLUSH
10.0000 mL | Freq: Once | INTRAVENOUS | Status: AC
  Administered 2020-12-27: 10 mL

## 2020-12-27 MED ORDER — HEPARIN SOD (PORK) LOCK FLUSH 100 UNIT/ML IV SOLN
500.0000 [IU] | Freq: Once | INTRAVENOUS | Status: AC
Start: 2020-12-27 — End: 2020-12-27
  Administered 2020-12-27: 500 [IU] via INTRAVENOUS

## 2020-12-27 MED ORDER — OXYCODONE HCL 10 MG PO TABS: 10.0000 mg | ORAL_TABLET | ORAL | 0 refills | Status: AC | PRN

## 2020-12-27 MED ORDER — ATENOLOL 50 MG PO TABS: 50.0000 mg | ORAL_TABLET | Freq: Every day | ORAL | 1 refills | Status: DC

## 2020-12-27 MED ORDER — SODIUM CHLORIDE 0.9% FLUSH
10.0000 mL | INTRAVENOUS | Status: DC | PRN
  Administered 2020-12-27: 10 mL via INTRAVENOUS

## 2020-12-27 NOTE — Assessment & Plan Note (Signed)
She has uncontrolled nausea vomiting and dehydrated She is not taking her medications correctly I gave her IV Zofran with improvement of her nausea I reinforced importance of taking her antiemetics correctly

## 2020-12-27 NOTE — Patient Instructions (Signed)
Dehydration, Adult Dehydration is condition in which there is not enough water or other fluids in the body. This happens when a person loses more fluids than he or she takes in. Important body parts cannot work right without the right amount of fluids. Anyloss of fluids from the body can cause dehydration. Dehydration can be mild, worse, or very bad. It should be treated right away tokeep it from getting very bad. What are the causes? This condition may be caused by: Conditions that cause loss of water or other fluids, such as: Watery poop (diarrhea). Vomiting. Sweating a lot. Peeing (urinating) a lot. Not drinking enough fluids, especially when you: Are ill. Are doing things that take a lot of energy to do. Other illnesses and conditions, such as fever or infection. Certain medicines, such as medicines that take extra fluid out of the body (diuretics). Lack of safe drinking water. Not being able to get enough water and food. What increases the risk? The following factors may make you more likely to develop this condition: Having a long-term (chronic) illness that has not been treated the right way, such as: Diabetes. Heart disease. Kidney disease. Being 106 years of age or older. Having a disability. Living in a place that is high above the ground or sea (high in altitude). The thinner, dried air causes more fluid loss. Doing exercises that put stress on your body for a long time. What are the signs or symptoms? Symptoms of dehydration depend on how bad it is. Mild or worse dehydration Thirst. Dry lips or dry mouth. Feeling dizzy or light-headed, especially when you stand up from sitting. Muscle cramps. Your body making: Dark pee (urine). Pee may be the color of tea. Less pee than normal. Less tears than normal. Headache. Very bad dehydration Changes in skin. Skin may: Be cold to the touch (clammy). Be blotchy or pale. Not go back to normal right after you lightly pinch it  and let it go. Little or no tears, pee, or sweat. Changes in vital signs, such as: Fast breathing. Low blood pressure. Weak pulse. Pulse that is more than 100 beats a minute when you are sitting still. Other changes, such as: Feeling very thirsty. Eyes that look hollow (sunken). Cold hands and feet. Being mixed up (confused). Being very tired (lethargic) or having trouble waking from sleep. Short-term weight loss. Loss of consciousness. How is this treated? Treatment for this condition depends on how bad it is. Treatment should start right away. Do not wait until your condition gets very bad. Very bad dehydration is an emergency. You will need to go to a hospital. Mild or worse dehydration can be treated at home. You may be asked to: Drink more fluids. Drink an oral rehydration solution (ORS). This drink helps get the right amounts of fluids and salts and minerals in the blood (electrolytes). Very bad dehydration can be treated: With fluids through an IV tube. By getting normal levels of salts and minerals in your blood. This is often done by giving salts and minerals through a tube. The tube is passed through your nose and into your stomach. By treating the root cause. Follow these instructions at home: Oral rehydration solution If told by your doctor, drink an ORS: Make an ORS. Use instructions on the package. Start by drinking small amounts, about  cup (120 mL) every 5-10 minutes. Slowly drink more until you have had the amount that your doctor said to have. Eating and drinking  Drink enough clear fluid to keep your pee pale yellow. If you were told to drink an ORS, finish the ORS first. Then, start slowly drinking other clear fluids. Drink fluids such as: Water. Do not drink only water. Doing that can make the salt (sodium) level in your body get too low. Water from ice chips you suck on. Fruit juice that you have added water to (diluted). Low-calorie sports  drinks. Eat foods that have the right amounts of salts and minerals, such as: Bananas. Oranges. Potatoes. Tomatoes. Spinach. Do not drink alcohol. Avoid: Drinks that have a lot of sugar. These include: High-calorie sports drinks. Fruit juice that you did not add water to. Soda. Caffeine. Foods that are greasy or have a lot of fat or sugar. General instructions Take over-the-counter and prescription medicines only as told by your doctor. Do not take salt tablets. Doing that can make the salt level in your body get too high. Return to your normal activities as told by your doctor. Ask your doctor what activities are safe for you. Keep all follow-up visits as told by your doctor. This is important. Contact a doctor if: You have pain in your belly (abdomen) and the pain: Gets worse. Stays in one place. You have a rash. You have a stiff neck. You get angry or annoyed (irritable) more easily than normal. You are more tired or have a harder time waking than normal. You feel: Weak or dizzy. Very thirsty. Get help right away if you have: Any symptoms of very bad dehydration. Symptoms of vomiting, such as: You cannot eat or drink without vomiting. Your vomiting gets worse or does not go away. Your vomit has blood or green stuff in it. Symptoms that get worse with treatment. A fever. A very bad headache. Problems with peeing or pooping (having a bowel movement), such as: Watery poop that gets worse or does not go away. Blood in your poop (stool). This may cause poop to look black and tarry. Not peeing in 6-8 hours. Peeing only a small amount of very dark pee in 6-8 hours. Trouble breathing. These symptoms may be an emergency. Do not wait to see if the symptoms will go away. Get medical help right away. Call your local emergency services (911 in the [place] Do not drive yourself to the hospital. Summary Dehydration is a condition in which there is not enough water or other fluids  in the body. This happens when a person loses more fluids than he or she takes in. Treatment for this condition depends on how bad it is. Treatment should be started right away. Do not wait until your condition gets very bad. Drink enough clear fluid to keep your pee pale yellow. If you were told to drink an oral rehydration solution (ORS), finish the ORS first. Then, start slowly drinking other clear fluids. Take over-the-counter and prescription medicines only as told by your doctor. Get help right away if you have any symptoms of very bad dehydration. This information is not intended to replace advice given to you by your health care provider. Make sure you discuss any questions you have with your healthcare provider.  Hypokalemia Hypokalemia means that the amount of potassium in the blood is lower than normal. Potassium is a chemical (electrolyte) that helps regulate the amount of fluid in the body. It also stimulates muscle tightening (contraction) and helps nerves work properly. Normally, most of the body's potassium is inside cells, and only a very small amount is in the  blood. Because the amount in the blood is so small, minorchanges to potassium levels in the blood can be life-threatening. What are the causes? This condition may be caused by: Antibiotic medicine. Diarrhea or vomiting. Taking too much of a medicine that helps you have a bowel movement (laxative) can cause diarrhea and lead to hypokalemia. Chronic kidney disease (CKD). Medicines that help the body get rid of excess fluid (diuretics). Eating disorders, such as bulimia. Low magnesium levels in the body. Sweating a lot. What are the signs or symptoms? Symptoms of this condition include: Weakness. Constipation. Fatigue. Muscle cramps. Mental confusion. Skipped heartbeats or irregular heartbeat (palpitations). Tingling or numbness. How is this diagnosed? This condition is diagnosed with a blood test. How is this  treated? This condition may be treated by: Taking potassium supplements by mouth. Adjusting the medicines that you take. Eating more foods that contain a lot of potassium. If your potassium level is very low, you may need to get potassium through anIV and be monitored in the hospital. Follow these instructions at home:  Take over-the-counter and prescription medicines only as told by your health care provider. This includes vitamins and supplements. Eat a healthy diet. A healthy diet includes fresh fruits and vegetables, whole grains, healthy fats, and lean proteins. If instructed, eat more foods that contain a lot of potassium. This includes: Nuts, such as peanuts and pistachios. Seeds, such as sunflower seeds and pumpkin seeds. Peas, lentils, and lima beans. Whole grain and bran cereals and breads. Fresh fruits and vegetables, such as apricots, avocado, bananas, cantaloupe, kiwi, oranges, tomatoes, asparagus, and potatoes. Orange juice. Tomato juice. Red meats. Yogurt. Keep all follow-up visits as told by your health care provider. This is important. Contact a health care provider if you: Have weakness that gets worse. Feel your heart pounding or racing. Vomit. Have diarrhea. Have diabetes (diabetes mellitus) and you have trouble keeping your blood sugar (glucose) in your target range. Get help right away if you: Have chest pain. Have shortness of breath. Have vomiting or diarrhea that lasts for more than 2 days. Faint. Summary Hypokalemia means that the amount of potassium in the blood is lower than normal. This condition is diagnosed with a blood test. Hypokalemia may be treated by taking potassium supplements, adjusting the medicines that you take, or eating more foods that are high in potassium. If your potassium level is very low, you may need to get potassium through an IV and be monitored in the hospital. This information is not intended to replace advice given to you by  your health care provider. Make sure you discuss any questions you have with your healthcare provider. Document Revised: 12/04/2017 Document Reviewed: 12/04/2017 [employer] Patient Education  [place] Revised: 12/04/2018 Document Reviewed: 12/04/2018 Elsevier Patient Education  [place].

## 2020-12-27 NOTE — Assessment & Plan Note (Signed)
She had recent severe constipation The patient is reluctant to take laxatives because of history of diarrhea

## 2020-12-27 NOTE — Progress Notes (Signed)
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[facility]) Her examination reveals some mild tenderness which I suspect is caused by recent severe constipation However, the patient is having severe uncontrolled nausea and vomiting to the point she is getting dehydrated with acute renal failure At this point in time, I think is prudent to rule out cancer recurrence I plan to order CT imaging next week and see her back to review test results Continue supportive care I will see her again next week for further follow-up  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 I have increased her dose of oxycodone and plan on repeating imaging study as above  Generalized weakness She has profound weakness She is tachycardic She is dehydrated I recommend IV fluid support today At the end of 1 hour infusion of 1 L normal saline, she remain dehydrated and tachycardic I recommend second dose of IV fluid with normal saline and she will return daily for the next 2 days to get IV fluid support  Hypokalemia, inadequate intake This is likely due to poor oral intake We discussed importance of regular diet I will give her IV potassium today while she is receiving IV fluids  Nausea with vomiting She has uncontrolled nausea vomiting and dehydrated She is not taking her medications correctly I gave her IV Zofran with improvement of her nausea I reinforced importance of taking her antiemetics correctly  Other constipation She had recent severe constipation The patient is reluctant to take laxatives because of history of diarrhea  Goals of care, counseling/discussion She is a very challenging patient, noncompliant, not taking medication correctly and refused admission today despite acute on chronic  renal failure and uncontrolled hypertension and profound dehydration Review medication with the patient extensively, both with verbal and documentation of written notes She will come back daily for IV fluid support I plan to see her again next week for further follow-up  Orders Placed This Encounter  Procedures   CT CHEST ABDOMEN PELVIS W CONTRAST    Standing Status:   Future    Standing Expiration Date:   12/27/2021    Order Specific Question:   Preferred imaging location?    Answer:   [facility]    Order Specific Question:   Radiology Contrast Protocol - do NOT remove file path    Answer:   [email or web address]    All questions were answered. The patient knows to call the clinic with any problems, questions or concerns. The total time spent in the appointment was 80 minutes encounter with patients including review of chart and various tests results, discussions about plan of care and coordination of care plan   [name], MD 12/27/2020 3:22 PM  INTERVAL HISTORY: Please see below for problem oriented charting. She is seen for treatment today Since last time I saw her, she documented very poor oral intake She has uncontrolled pain, daily nausea and vomiting She brought with her all her pill bottles which I have reviewed with the patient extensively I do not believe the patient is taking her medications correctly Despite uncontrolled nausea and vomiting, she has not taken her antiemetics She average about 20 ounces of fluid daily She ate very little She is taking her laxative and is no longer constipated The patient is tearful because she has not been feeling well  SUMMARY OF ONCOLOGIC HISTORY: Oncology History Overview Note  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     02/26/2020  Imaging   1. 3 mm nonobstructing renal stone within the left kidney. 2. Findings suggestive of a small hemorrhagic cyst within the lower pole of the right kidney, with an anterior right lower pole heterogeneous renal soft tissue mass. MRI correlation is recommended, as an underlying neoplastic process cannot be excluded. 3. Colonic diverticulosis. 4. Multiple exophytic uterine fibroids. 5. Evidence of prior cholecystectomy.   03/28/2020 Imaging   2.4 cm enhancing mass in the anterior right lower kidney, suspicious for solid renal neoplasm such as renal cell carcinoma.   Single right renal artery and vein.  No renal vein invasion.   Small retroperitoneal nodes, measuring up to 9 mm short axis, mildly prominent but technically within the upper limits of normal. Attention on follow-up is suggested.   4 x 5 mm nodule in the medial right lower lobe. This is nonspecific and unlikely to reflect a metastasis but warrants attention on follow-up. Consider dedicated CT chest in 3-6 months.   3 mm nonobstructing left lower pole renal calculus. No hydronephrosis.     04/04/2020 Imaging   1. 10 mm endometrial stripe thickness. This is considered abnormal in a postmenopausal female with bleeding. In the setting of post-menopausal bleeding, endometrial sampling is indicated to exclude carcinoma.  2. Multiple uterine fibroids including potential submucosal fibroid in the lower uterine segment.   04/19/2020 Pathology Results   A. ENDOMETRIUM, BIOPSY:  -  High-grade carcinoma  Immunohistochemical and morphometric analysis performed manually   The tumor cells are POSITIVE for Her2 (3+).    04/19/2020 Procedure   She was seen by Dr. Astrid Drafts on 04/19/2020.  At that visit her cervix was noted to be nodular and firm and abnormal appearing.  Pap taken at that visit was positive for adenocarcinoma, endometrial.  An endometrial Pipelle biopsy was taken at the same visit which revealed high-grade endometrial  cancer, suspicious for serous carcinoma.     05/12/2020 Cancer Staging   Staging form: Corpus Uteri - Carcinoma and Carcinosarcoma, AJCC 8th Edition - Clinical stage from 05/12/2020: FIGO Stage IVB (cT3, cN2, cM1) - Signed by [name], MD on 05/31/2020   05/30/2020 PET scan   1. Hypermetabolic lymph nodes identified in the left supraclavicular region/thoracic inlet, mediastinum, hilar regions, right retrocrural space, para-aortic retroperitoneum, and bilateral pelvic sidewall, compatible with metastatic disease. 2. Moderate volume ascites on this noncontrast CT study largely obscures peritoneal and omental disease although hypermetabolic plaque-like uptake in the anterior high abdomen is probably peritoneal with relatively bulky hypermetabolism in the omentum. These findings are also compatible with metastatic disease. 3. Hypermetabolic left upper lobe pulmonary nodule consistent with metastatic disease. Lung primary is considered less likely but not excluded. 4. Scattered areas of focal marrow uptake are suspicious for bony metastases although no underlying abnormality is evident on CT imaging. 5. Diffuse uptake in the uterine anatomy compatible with the patient's known history of endometrial carcinoma. 6. As above, moderate volume ascites is new in the interval. 7. Enhancing lesion lower pole right kidney seen on previous CT of 03/28/2020 is less conspicuous on today's noncontrast CT imaging. Diffuse normal background FDG accumulation could obscure hypermetabolism in this lesion previously characterized as suspicious for renal cell carcinoma. 8. Bibasilar collapse/consolidation in the lower lobes.  06/01/2020 Procedure   Ultrasound and fluoroscopically guided right internal jugular single lumen power port catheter insertion. Tip in the SVC/RA junction. Catheter ready for use.   06/02/2020 Echocardiogram    1. GLS -18.2%.  2. Left ventricular ejection fraction, by estimation, is 60 to 65%. The  left ventricle has normal function. The left ventricle has no regional wall motion abnormalities. There is mild left ventricular hypertrophy. Left ventricular diastolic parameters are consistent with Grade I diastolic dysfunction (impaired relaxation).  3. Right ventricular systolic function is normal. The right ventricular size is normal.  4. The mitral valve is normal in structure. No evidence of mitral valve regurgitation. No evidence of mitral stenosis.  5. The aortic valve is tricuspid. Aortic valve regurgitation is not visualized. No aortic stenosis is present.  6. The inferior vena cava is normal in size with greater than 50% respiratory variability, suggesting right atrial pressure of 3 mmHg.     06/03/2020 -  Chemotherapy    Patient is on Treatment Plan: UTERINE SEROUS CARCINOMA CARBOPLATIN + PACLITAXEL + TRASTUZUMAB Q21D X 6 CYCLES / TRASTUZUMAB Q21D       07/25/2020 Imaging   1. Interval response to therapy. There has been interval decrease in size of mediastinal, retrocrural, retroperitoneal, and left pelvic sidewall lymph nodes. 2. Decreased volume of ascites. Interval decrease in thickness of omental cake secondary to peritoneal disease. 3. Decrease in size of FDG avid tumor within the umbilicus. 4. Decrease in size of left upper lobe lung nodule. 5. Persistent left-sided hydronephrosis and hydroureter. No obstructing stone identified. 6. Previously noted solid enhancing lesion arising off the anterior cortex of the inferior pole of the right kidney is again noted and remains worrisome for renal cell carcinoma. 7. Aortic atherosclerosis.     08/08/2020 Echocardiogram    1. Left ventricular ejection fraction, by estimation, is 65 to 70%. The left ventricle has normal function. The left ventricle has no regional wall motion abnormalities. There is mild concentric left ventricular hypertrophy of the basal-septal segment. Left ventricular diastolic parameters are consistent with Grade I  diastolic dysfunction (impaired relaxation). The average left ventricular global longitudinal strain is -18.5 %. The global longitudinal strain is normal.  2. Right ventricular systolic function is normal. The right ventricular size is normal.  3. The mitral valve is normal in structure. No evidence of mitral valve regurgitation.  4. The aortic valve is tricuspid. Aortic valve regurgitation is not visualized. No aortic stenosis is present.  5. The inferior vena cava is normal in size with greater than 50% respiratory variability, suggesting right atrial pressure of 3 mmHg.   Comparison(s): A prior study was performed on 06/02/2020. No significant change from prior study. Prior images reviewed side by side. Similar LV deformation and strain and improved loading conditions (improved blood pressure).   11/03/2020 Imaging   1. Overall no significant interval change in exam. 2. Signs of peritoneal carcinomatosis are again noted with perihepatic ascites and omental caking. Not significantly changed in the interval. 3. Slight decrease in size left supraclavicular and mediastinal lymph nodes. Stable borderline retroperitoneal and left pelvic lymph nodes. 4. Small pulmonary nodules are stable. 5. Stable appearance of solid enhancing lesion arising off the anterior cortex of the inferior pole of right kidney compatible with renal cell carcinoma. 6. Stable appearance of mild left hydronephrosis and left hydroureter to just before the urinary bladder. No obstructing stone identified. 7. Aortic atherosclerosis.   11/16/2020 Echocardiogram    1. Left ventricular ejection fraction, by estimation,  is 55 to 60%. The left ventricle has normal function. The left ventricle has no regional wall motion abnormalities. There is moderate left ventricular hypertrophy. Left ventricular diastolic parameters are consistent with Grade I diastolic dysfunction (impaired relaxation). The average left ventricular global longitudinal  strain is -18.0 %. The global longitudinal strain is normal.  2. Right ventricular systolic function is normal. The right ventricular size is normal. Tricuspid regurgitation signal is inadequate for assessing PA pressure.  3. The mitral valve is normal in structure. No evidence of mitral valve regurgitation. No evidence of mitral stenosis.  4. The aortic valve is tricuspid. Aortic valve regurgitation is not visualized. Mild aortic valve sclerosis is present, with no evidence of aortic valve stenosis.  5. The inferior vena cava is normal in size with greater than 50% respiratory variability, suggesting right atrial pressure of 3 mmHg.     Metastasis to lymph nodes ([place])  05/31/2020 Initial Diagnosis   Metastasis to lymph nodes ([place])   06/03/2020 -  Chemotherapy    Patient is on Treatment Plan: UTERINE SEROUS CARCINOMA CARBOPLATIN + PACLITAXEL + TRASTUZUMAB Q21D X 6 CYCLES / TRASTUZUMAB Q21D       Metastasis to lung ([place])  05/31/2020 Initial Diagnosis   Metastasis to lung ([place])   06/03/2020 -  Chemotherapy    Patient is on Treatment Plan: UTERINE SEROUS CARCINOMA CARBOPLATIN + PACLITAXEL + TRASTUZUMAB Q21D X 6 CYCLES / TRASTUZUMAB Q21D         REVIEW OF SYSTEMS:   Constitutional: Denies fevers, chills or abnormal weight loss Eyes: Denies blurriness of vision Ears, nose, mouth, throat, and face: Denies mucositis or sore throat Respiratory: Denies cough, dyspnea or wheezes Cardiovascular: Denies palpitation, chest discomfort or lower extremity swelling Skin: Denies abnormal skin rashes Lymphatics: Denies new lymphadenopathy or easy bruising Behavioral/Psych: Mood is stable, no new changes  All other systems were reviewed with the patient and are negative.  I have reviewed the past medical history, past surgical history, social history and family history with the patient and they are unchanged from previous note.  ALLERGIES:  has No Known Allergies.  MEDICATIONS:  Current  Outpatient Medications  Medication Sig Dispense Refill   atenolol (TENORMIN) 50 MG tablet Take 1 tablet (50 mg total) by mouth daily. 30 tablet 1   amLODipine (NORVASC) 10 MG tablet TAKE 1 TABLET(10 MG) BY MOUTH DAILY 30 tablet 1   lidocaine-prilocaine (EMLA) cream Apply to affected area once 30 g 3   losartan (COZAAR) 100 MG tablet Take 1 tablet (100 mg total) by mouth daily. 90 tablet 1   magnesium oxide (MAG-OX) 400 (240 Mg) MG tablet Take 1 tablet (400 mg total) by mouth daily. 30 tablet 1   ondansetron (ZOFRAN) 8 MG tablet Take 1 tablet (8 mg total) by mouth every 8 (eight) hours as needed for refractory nausea / vomiting. Start on day 3 after carboplatin chemo. 30 tablet 1   oxyCODONE 10 MG TABS Take 1 tablet (10 mg total) by mouth every 4 (four) hours as needed for severe pain. 60 tablet 0   polyethylene glycol (MIRALAX / GLYCOLAX) 17 g packet Take 17 g by mouth daily.     prochlorperazine (COMPAZINE) 10 MG tablet TAKE 1 TABLET(10 MG) BY MOUTH EVERY 6 HOURS AS NEEDED FOR NAUSEA OR VOMITING 90 tablet 1   No current facility-administered medications for this visit.   Facility-Administered Medications Ordered in Other Visits  Medication Dose Route Frequency Provider Last Rate Last Admin   0.9 %  sodium chloride infusion   Intravenous Once [name], MD 500 mL/hr at 12/27/20 1424 New Bag at 12/27/20 1424    PHYSICAL EXAMINATION: ECOG PERFORMANCE STATUS: 2 - Symptomatic, <50% confined to bed  Vitals:   12/27/20 1049  BP: (!) 196/113  Pulse: (!) 113  Resp: 18  Temp: (!) 97.4 F (36.3 C)  SpO2: 100%   Filed Weights   12/27/20 1049  Weight: 178 lb 11.2 oz (81.1 kg)    GENERAL:alert, she is distress and appears uncomfortable and weak NEURO: alert & oriented x 3 with fluent speech, no focal motor/sensory deficits  LABORATORY DATA:  I have reviewed the data as listed    Component Value Date/Time   NA 137 12/27/2020 1139   NA 138 02/23/2020 1113   K 2.9 (L) 12/27/2020 1139    CL 97 (L) 12/27/2020 1139   CO2 26 12/27/2020 1139   GLUCOSE 133 (H) 12/27/2020 1139   BUN 30 (H) 12/27/2020 1139   BUN 15 02/23/2020 1113   CREATININE 1.45 (H) 12/27/2020 1139   CALCIUM 9.3 12/27/2020 1139   PROT 7.2 12/27/2020 1139   PROT 7.8 02/23/2020 1113   ALBUMIN 3.3 (L) 12/27/2020 1139   ALBUMIN 4.4 02/23/2020 1113   AST 18 12/27/2020 1139   ALT 10 12/27/2020 1139   ALKPHOS 87 12/27/2020 1139   BILITOT 0.5 12/27/2020 1139   GFRNONAA 40 (L) 12/27/2020 1139   GFRAA 91 02/23/2020 1113    No results found for: SPEP, UPEP  Lab Results  Component Value Date   WBC 9.2 12/27/2020   NEUTROABS 7.7 12/27/2020   HGB 12.2 12/27/2020   HCT 36.7 12/27/2020   MCV 88.2 12/27/2020   PLT 363 12/27/2020      Chemistry      Component Value Date/Time   NA 137 12/27/2020 1139   NA 138 02/23/2020 1113   K 2.9 (L) 12/27/2020 1139   CL 97 (L) 12/27/2020 1139   CO2 26 12/27/2020 1139   BUN 30 (H) 12/27/2020 1139   BUN 15 02/23/2020 1113   CREATININE 1.45 (H) 12/27/2020 1139      Component Value Date/Time   CALCIUM 9.3 12/27/2020 1139   ALKPHOS 87 12/27/2020 1139   AST 18 12/27/2020 1139   ALT 10 12/27/2020 1139   BILITOT 0.5 12/27/2020 1139

## 2020-12-27 NOTE — Progress Notes (Signed)
Per Dr. Alvy Bimler - okay for patient to be discharged with elevated blood pressure. Patient denies any headaches, blurred vision, or chest pain at this time.  Patient discharged in stable condition and assisted to transportation by RN.

## 2020-12-27 NOTE — Assessment & Plan Note (Signed)
She is a very challenging patient, noncompliant, not taking medication correctly and refused admission today despite acute on chronic renal failure and uncontrolled hypertension and profound dehydration Review medication with the patient extensively, both with verbal and documentation of written notes She will come back daily for IV fluid support I plan to see her again next week for further follow-up

## 2020-12-27 NOTE — Assessment & Plan Note (Signed)
She has severe uncontrolled cancer associated pain I have increased her dose of oxycodone and plan on repeating imaging study as above

## 2020-12-27 NOTE — Progress Notes (Signed)
Per Dr. Alvy Bimler, no immunotherapy treatment today. Pt. to receive IV fluids, antiemetic, and potassium chloride.

## 2020-12-27 NOTE — Assessment & Plan Note (Signed)
She has profound weakness She is tachycardic She is dehydrated I recommend IV fluid support today At the end of 1 hour infusion of 1 L normal saline, she remain dehydrated and tachycardic I recommend second dose of IV fluid with normal saline and she will return daily for the next 2 days to get IV fluid support

## 2020-12-27 NOTE — Assessment & Plan Note (Signed)
This is likely due to poor oral intake We discussed importance of regular diet I will give her IV potassium today while she is receiving IV fluids

## 2020-12-27 NOTE — Assessment & Plan Note (Signed)
Her examination reveals some mild tenderness which I suspect is caused by recent severe constipation However, the patient is having severe uncontrolled nausea and vomiting to the point she is getting dehydrated with acute renal failure At this point in time, I think is prudent to rule out cancer recurrence I plan to order CT imaging next week and see her back to review test results Continue supportive care I will see her again next week for further follow-up

## 2020-12-28 ENCOUNTER — Other Ambulatory Visit: Payer: Self-pay | Admitting: Hematology and Oncology

## 2020-12-28 ENCOUNTER — Inpatient Hospital Stay: Payer: 59

## 2020-12-28 ENCOUNTER — Ambulatory Visit: Payer: 59

## 2020-12-28 DIAGNOSIS — C7800 Secondary malignant neoplasm of unspecified lung: Secondary | ICD-10-CM

## 2020-12-28 DIAGNOSIS — C541 Malignant neoplasm of endometrium: Secondary | ICD-10-CM

## 2020-12-28 DIAGNOSIS — C778 Secondary and unspecified malignant neoplasm of lymph nodes of multiple regions: Secondary | ICD-10-CM

## 2020-12-28 MED ORDER — SODIUM CHLORIDE 0.9 % IV SOLN: Freq: Once | INTRAVENOUS | Status: DC

## 2020-12-28 MED ORDER — ONDANSETRON HCL 4 MG/2ML IJ SOLN
4.0000 mg | Freq: Once | INTRAMUSCULAR | Status: AC
  Administered 2020-12-28: 4 mg via INTRAVENOUS

## 2020-12-28 MED ORDER — SODIUM CHLORIDE 0.9 % IV SOLN: Freq: Once | INTRAVENOUS | Status: AC

## 2020-12-28 MED ORDER — ONDANSETRON HCL 4 MG/2ML IJ SOLN
4.0000 mg | Freq: Once | INTRAMUSCULAR | Status: DC
  Filled 2020-12-28: qty 2

## 2020-12-28 NOTE — Patient Instructions (Signed)

## 2020-12-29 ENCOUNTER — Other Ambulatory Visit: Payer: Self-pay | Admitting: Hematology and Oncology

## 2020-12-29 ENCOUNTER — Encounter: Payer: Self-pay | Admitting: Hematology and Oncology

## 2020-12-29 ENCOUNTER — Inpatient Hospital Stay: Payer: 59

## 2020-12-29 ENCOUNTER — Inpatient Hospital Stay (HOSPITAL_BASED_OUTPATIENT_CLINIC_OR_DEPARTMENT_OTHER): Payer: 59 | Admitting: Hematology and Oncology

## 2020-12-29 ENCOUNTER — Other Ambulatory Visit: Payer: Self-pay

## 2020-12-29 ENCOUNTER — Telehealth: Payer: Self-pay

## 2020-12-29 DIAGNOSIS — R112 Nausea with vomiting, unspecified: Secondary | ICD-10-CM

## 2020-12-29 DIAGNOSIS — K2100 Gastro-esophageal reflux disease with esophagitis, without bleeding: Secondary | ICD-10-CM

## 2020-12-29 DIAGNOSIS — K5909 Other constipation: Secondary | ICD-10-CM

## 2020-12-29 DIAGNOSIS — C778 Secondary and unspecified malignant neoplasm of lymph nodes of multiple regions: Secondary | ICD-10-CM

## 2020-12-29 DIAGNOSIS — E876 Hypokalemia: Secondary | ICD-10-CM

## 2020-12-29 DIAGNOSIS — C541 Malignant neoplasm of endometrium: Secondary | ICD-10-CM

## 2020-12-29 DIAGNOSIS — G893 Neoplasm related pain (acute) (chronic): Secondary | ICD-10-CM

## 2020-12-29 DIAGNOSIS — C7800 Secondary malignant neoplasm of unspecified lung: Secondary | ICD-10-CM

## 2020-12-29 DIAGNOSIS — K219 Gastro-esophageal reflux disease without esophagitis: Secondary | ICD-10-CM | POA: Insufficient documentation

## 2020-12-29 MED ORDER — SODIUM CHLORIDE 0.9 % IV SOLN
40.0000 mg | Freq: Once | INTRAVENOUS | Status: AC
  Administered 2020-12-29: 40 mg via INTRAVENOUS
  Filled 2020-12-29: qty 4

## 2020-12-29 MED ORDER — SODIUM CHLORIDE 0.9 % IV SOLN: Freq: Once | INTRAVENOUS | Status: AC

## 2020-12-29 MED ORDER — SODIUM CHLORIDE 0.9 % IV SOLN: Freq: Once | INTRAVENOUS | Status: DC

## 2020-12-29 MED ORDER — ONDANSETRON HCL 4 MG/2ML IJ SOLN
4.0000 mg | Freq: Once | INTRAMUSCULAR | Status: AC
  Administered 2020-12-29: 4 mg via INTRAVENOUS
  Filled 2020-12-29: qty 2

## 2020-12-29 MED ORDER — SODIUM CHLORIDE 0.9% FLUSH
10.0000 mL | Freq: Once | INTRAVENOUS | Status: AC
  Administered 2020-12-29: 10 mL via INTRAVENOUS

## 2020-12-29 MED ORDER — HEPARIN SOD (PORK) LOCK FLUSH 100 UNIT/ML IV SOLN
500.0000 [IU] | Freq: Once | INTRAVENOUS | Status: AC
  Administered 2020-12-29: 500 [IU] via INTRAVENOUS

## 2020-12-29 MED ORDER — ONDANSETRON HCL 4 MG/2ML IJ SOLN: 4.0000 mg | Freq: Once | INTRAMUSCULAR | Status: DC

## 2020-12-29 NOTE — Patient Instructions (Signed)

## 2020-12-29 NOTE — Progress Notes (Signed)
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[facility]) She is scheduled for repeat CT imaging next week Continue aggressive supportive care  Nausea with vomiting She continues to have severe nausea and vomiting She will continue daily IV fluids and IV Zofran She will also continue aggressive laxative therapy to avoid constipation as a contributing factor to her nausea and vomiting  Hypokalemia, inadequate intake She is eating better I plan to recheck her labs tomorrow  Cancer associated pain She has nonspecific lower back and abdominal pain As above, I plan to order imaging study She will continue intermittent pain medicine as needed  Other constipation I recommend MiraLAX twice daily and lactulose 3 times daily Will reassess tomorrow  GERD (gastroesophageal reflux disease) From her description, I suspect she has a component of some reflux I recommend adding Pepcid today  No orders of the defined types were placed in this encounter.   All questions were answered. The patient knows to call the clinic with any problems, questions or concerns. The total time spent in the appointment was 30 minutes encounter with patients including review of chart and various tests results, discussions about plan of care and coordination of care plan   [name], MD 12/29/2020 11:03 AM  INTERVAL HISTORY: Please see below for problem oriented charting. 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     02/26/2020 Imaging   1. 3 mm nonobstructing renal stone within the left kidney. 2. Findings suggestive of a small hemorrhagic cyst within the lower pole of the right kidney, with an anterior right lower pole heterogeneous renal soft tissue mass. MRI correlation is recommended, as an underlying neoplastic process cannot be excluded. 3. Colonic diverticulosis. 4. Multiple exophytic uterine fibroids. 5. Evidence of prior cholecystectomy.   03/28/2020 Imaging   2.4 cm enhancing mass in the anterior right lower kidney, suspicious for solid renal neoplasm such as renal cell carcinoma.   Single right renal artery and vein.  No renal vein invasion.   Small retroperitoneal nodes, measuring up to 9 mm short axis, mildly prominent but technically within the upper limits of normal. Attention on follow-up is suggested.   4 x 5 mm nodule in the medial right lower lobe. This is nonspecific and unlikely to reflect a metastasis but warrants attention on follow-up. Consider dedicated CT chest in 3-6 months.   3 mm nonobstructing left lower pole renal calculus. No hydronephrosis.     04/04/2020 Imaging   1. 10 mm endometrial stripe thickness. This is considered abnormal in a postmenopausal female with bleeding. In the setting of post-menopausal bleeding, endometrial sampling is indicated to exclude carcinoma.  2. Multiple uterine fibroids including potential submucosal  fibroid in the lower uterine segment.   04/19/2020 Pathology Results   A. ENDOMETRIUM, BIOPSY:  -  High-grade carcinoma  Immunohistochemical and morphometric analysis performed manually    The tumor cells are POSITIVE for Her2 (3+).    04/19/2020 Procedure   She was seen by Dr. Astrid Drafts on 04/19/2020.  At that visit her cervix was noted to be nodular and firm and abnormal appearing.  Pap taken at that visit was positive for adenocarcinoma, endometrial.  An endometrial Pipelle biopsy was taken at the same visit which revealed high-grade endometrial cancer, suspicious for serous carcinoma.     05/12/2020 Cancer Staging   Staging form: Corpus Uteri - Carcinoma and Carcinosarcoma, AJCC 8th Edition - Clinical stage from 05/12/2020: FIGO Stage IVB (cT3, cN2, cM1) - Signed by [name], MD on 05/31/2020   05/30/2020 PET scan   1. Hypermetabolic lymph nodes identified in the left supraclavicular region/thoracic inlet, mediastinum, hilar regions, right retrocrural space, para-aortic retroperitoneum, and bilateral pelvic sidewall, compatible with metastatic disease. 2. Moderate volume ascites on this noncontrast CT study largely obscures peritoneal and omental disease although hypermetabolic plaque-like uptake in the anterior high abdomen is probably peritoneal with relatively bulky hypermetabolism in the omentum. These findings are also compatible with metastatic disease. 3. Hypermetabolic left upper lobe pulmonary nodule consistent with metastatic disease. Lung primary is considered less likely but not excluded. 4. Scattered areas of focal marrow uptake are suspicious for bony metastases although no underlying abnormality is evident on CT imaging. 5. Diffuse uptake in the uterine anatomy compatible with the patient's known history of endometrial carcinoma. 6. As above, moderate volume ascites is new in the interval. 7. Enhancing lesion lower pole right kidney seen on previous CT of 03/28/2020 is less conspicuous on today's noncontrast CT imaging. Diffuse normal background FDG accumulation could obscure hypermetabolism in this lesion previously characterized as suspicious for renal cell  carcinoma. 8. Bibasilar collapse/consolidation in the lower lobes.   06/01/2020 Procedure   Ultrasound and fluoroscopically guided right internal jugular single lumen power port catheter insertion. Tip in the SVC/RA junction. Catheter ready for use.   06/02/2020 Echocardiogram    1. GLS -18.2%.  2. Left ventricular ejection fraction, by estimation, is 60 to 65%. The left ventricle has normal function. The left ventricle has no regional wall motion abnormalities. There is mild left ventricular hypertrophy. Left ventricular diastolic parameters are consistent with Grade I diastolic dysfunction (impaired relaxation).  3. Right ventricular systolic function is normal. The right ventricular size is normal.  4. The mitral valve is normal in structure. No evidence of mitral valve regurgitation. No evidence of mitral stenosis.  5. The aortic valve is tricuspid. Aortic valve regurgitation is not visualized. No aortic stenosis is present.  6. The inferior vena cava is normal in size with greater than 50% respiratory variability, suggesting right atrial pressure of 3 mmHg.     06/03/2020 -  Chemotherapy    Patient is on Treatment Plan: UTERINE SEROUS CARCINOMA CARBOPLATIN + PACLITAXEL + TRASTUZUMAB Q21D X 6 CYCLES / TRASTUZUMAB Q21D       07/25/2020 Imaging   1. Interval response to therapy. There has been interval decrease in size of mediastinal, retrocrural, retroperitoneal, and left pelvic sidewall lymph nodes. 2. Decreased volume of ascites. Interval decrease in thickness of omental cake secondary to peritoneal disease. 3. Decrease in size of FDG avid tumor within the umbilicus. 4. Decrease in size of left upper lobe lung nodule. 5. Persistent left-sided hydronephrosis and hydroureter. No obstructing stone  identified. 6. Previously noted solid enhancing lesion arising off the anterior cortex of the inferior pole of the right kidney is again noted and remains worrisome for renal cell carcinoma. 7.  Aortic atherosclerosis.     08/08/2020 Echocardiogram    1. Left ventricular ejection fraction, by estimation, is 65 to 70%. The left ventricle has normal function. The left ventricle has no regional wall motion abnormalities. There is mild concentric left ventricular hypertrophy of the basal-septal segment. Left ventricular diastolic parameters are consistent with Grade I diastolic dysfunction (impaired relaxation). The average left ventricular global longitudinal strain is -18.5 %. The global longitudinal strain is normal.  2. Right ventricular systolic function is normal. The right ventricular size is normal.  3. The mitral valve is normal in structure. No evidence of mitral valve regurgitation.  4. The aortic valve is tricuspid. Aortic valve regurgitation is not visualized. No aortic stenosis is present.  5. The inferior vena cava is normal in size with greater than 50% respiratory variability, suggesting right atrial pressure of 3 mmHg.   Comparison(s): A prior study was performed on 06/02/2020. No significant change from prior study. Prior images reviewed side by side. Similar LV deformation and strain and improved loading conditions (improved blood pressure).   11/03/2020 Imaging   1. Overall no significant interval change in exam. 2. Signs of peritoneal carcinomatosis are again noted with perihepatic ascites and omental caking. Not significantly changed in the interval. 3. Slight decrease in size left supraclavicular and mediastinal lymph nodes. Stable borderline retroperitoneal and left pelvic lymph nodes. 4. Small pulmonary nodules are stable. 5. Stable appearance of solid enhancing lesion arising off the anterior cortex of the inferior pole of right kidney compatible with renal cell carcinoma. 6. Stable appearance of mild left hydronephrosis and left hydroureter to just before the urinary bladder. No obstructing stone identified. 7. Aortic atherosclerosis.   11/16/2020 Echocardiogram     1. Left ventricular ejection fraction, by estimation, is 55 to 60%. The left ventricle has normal function. The left ventricle has no regional wall motion abnormalities. There is moderate left ventricular hypertrophy. Left ventricular diastolic parameters are consistent with Grade I diastolic dysfunction (impaired relaxation). The average left ventricular global longitudinal strain is -18.0 %. The global longitudinal strain is normal.  2. Right ventricular systolic function is normal. The right ventricular size is normal. Tricuspid regurgitation signal is inadequate for assessing PA pressure.  3. The mitral valve is normal in structure. No evidence of mitral valve regurgitation. No evidence of mitral stenosis.  4. The aortic valve is tricuspid. Aortic valve regurgitation is not visualized. Mild aortic valve sclerosis is present, with no evidence of aortic valve stenosis.  5. The inferior vena cava is normal in size with greater than 50% respiratory variability, suggesting right atrial pressure of 3 mmHg.     Metastasis to lymph nodes ([place])  05/31/2020 Initial Diagnosis   Metastasis to lymph nodes ([place])   06/03/2020 -  Chemotherapy    Patient is on Treatment Plan: UTERINE SEROUS CARCINOMA CARBOPLATIN + PACLITAXEL + TRASTUZUMAB Q21D X 6 CYCLES / TRASTUZUMAB Q21D       Metastasis to lung ([place])  05/31/2020 Initial Diagnosis   Metastasis to lung ([place])   06/03/2020 -  Chemotherapy    Patient is on Treatment Plan: UTERINE SEROUS CARCINOMA CARBOPLATIN + PACLITAXEL + TRASTUZUMAB Q21D X 6 CYCLES / TRASTUZUMAB Q21D         REVIEW OF SYSTEMS:   Constitutional: Denies fevers, chills or abnormal weight loss Eyes:  Denies blurriness of vision Ears, nose, mouth, throat, and face: Denies mucositis or sore throat Respiratory: Denies cough, dyspnea or wheezes Cardiovascular: Denies palpitation, chest discomfort or lower extremity swelling Skin: Denies abnormal skin rashes Lymphatics: Denies new  lymphadenopathy or easy bruising Neurological:Denies numbness, tingling or new weaknesses Behavioral/Psych: Mood is stable, no new changes  All other systems were reviewed with the patient and are negative.  I have reviewed the past medical history, past surgical history, social history and family history with the patient and they are unchanged from previous note.  ALLERGIES:  has No Known Allergies.  MEDICATIONS:  Current Outpatient Medications  Medication Sig Dispense Refill   amLODipine (NORVASC) 10 MG tablet TAKE 1 TABLET(10 MG) BY MOUTH DAILY 30 tablet 1   atenolol (TENORMIN) 50 MG tablet Take 1 tablet (50 mg total) by mouth daily. 30 tablet 1   lidocaine-prilocaine (EMLA) cream Apply to affected area once 30 g 3   losartan (COZAAR) 100 MG tablet Take 1 tablet (100 mg total) by mouth daily. 90 tablet 1   magnesium oxide (MAG-OX) 400 (240 Mg) MG tablet Take 1 tablet (400 mg total) by mouth daily. 30 tablet 1   ondansetron (ZOFRAN) 8 MG tablet Take 1 tablet (8 mg total) by mouth every 8 (eight) hours as needed for refractory nausea / vomiting. Start on day 3 after carboplatin chemo. 30 tablet 1   oxyCODONE 10 MG TABS Take 1 tablet (10 mg total) by mouth every 4 (four) hours as needed for severe pain. 60 tablet 0   polyethylene glycol (MIRALAX / GLYCOLAX) 17 g packet Take 17 g by mouth daily.     prochlorperazine (COMPAZINE) 10 MG tablet TAKE 1 TABLET(10 MG) BY MOUTH EVERY 6 HOURS AS NEEDED FOR NAUSEA OR VOMITING 90 tablet 1   No current facility-administered medications for this visit.    PHYSICAL EXAMINATION: ECOG PERFORMANCE STATUS: 2 - Symptomatic, <50% confined to bed GENERAL:alert, no distress and comfortable SKIN: skin color, texture, turgor are normal, no rashes or significant lesions EYES: normal, Conjunctiva are pink and non-injected, sclera clear OROPHARYNX:no exudate, no erythema and lips, buccal mucosa, and tongue normal  NECK: supple, thyroid normal size, non-tender,  without nodularity LYMPH:  no palpable lymphadenopathy in the cervical, axillary or inguinal LUNGS: clear to auscultation and percussion with normal breathing effort HEART: regular rate & rhythm and no murmurs and no lower extremity edema ABDOMEN:abdomen soft, non-tender and normal bowel sounds Musculoskeletal:no cyanosis of digits and no clubbing  NEURO: alert & oriented x 3 with fluent speech, no focal motor/sensory deficits  LABORATORY DATA:  I have reviewed the data as listed    Component Value Date/Time   NA 137 12/27/2020 1139   NA 138 02/23/2020 1113   K 2.9 (L) 12/27/2020 1139   CL 97 (L) 12/27/2020 1139   CO2 26 12/27/2020 1139   GLUCOSE 133 (H) 12/27/2020 1139   BUN 30 (H) 12/27/2020 1139   BUN 15 02/23/2020 1113   CREATININE 1.45 (H) 12/27/2020 1139   CALCIUM 9.3 12/27/2020 1139   PROT 7.2 12/27/2020 1139   PROT 7.8 02/23/2020 1113   ALBUMIN 3.3 (L) 12/27/2020 1139   ALBUMIN 4.4 02/23/2020 1113   AST 18 12/27/2020 1139   ALT 10 12/27/2020 1139   ALKPHOS 87 12/27/2020 1139   BILITOT 0.5 12/27/2020 1139   GFRNONAA 40 (L) 12/27/2020 1139   GFRAA 91 02/23/2020 1113    No results found for: SPEP, UPEP  Lab Results  Component Value Date  WBC 9.2 12/27/2020   NEUTROABS 7.7 12/27/2020   HGB 12.2 12/27/2020   HCT 36.7 12/27/2020   MCV 88.2 12/27/2020   PLT 363 12/27/2020      Chemistry      Component Value Date/Time   NA 137 12/27/2020 1139   NA 138 02/23/2020 1113   K 2.9 (L) 12/27/2020 1139   CL 97 (L) 12/27/2020 1139   CO2 26 12/27/2020 1139   BUN 30 (H) 12/27/2020 1139   BUN 15 02/23/2020 1113   CREATININE 1.45 (H) 12/27/2020 1139      Component Value Date/Time   CALCIUM 9.3 12/27/2020 1139   ALKPHOS 87 12/27/2020 1139   AST 18 12/27/2020 1139   ALT 10 12/27/2020 1139   BILITOT 0.5 12/27/2020 1139

## 2020-12-29 NOTE — Assessment & Plan Note (Signed)
She has nonspecific lower back and abdominal pain As above, I plan to order imaging study She will continue intermittent pain medicine as needed

## 2020-12-29 NOTE — Assessment & Plan Note (Signed)
I recommend MiraLAX twice daily and lactulose 3 times daily Will reassess tomorrow

## 2020-12-29 NOTE — Assessment & Plan Note (Signed)
She is scheduled for repeat CT imaging next week Continue aggressive supportive care

## 2020-12-29 NOTE — Assessment & Plan Note (Signed)
She continues to have severe nausea and vomiting She will continue daily IV fluids and IV Zofran She will also continue aggressive laxative therapy to avoid constipation as a contributing factor to her nausea and vomiting

## 2020-12-29 NOTE — Telephone Encounter (Signed)
Called and left a message, lab/port flush appt added for tomorrow at 2 pm. Ask her to call the office back for questions.

## 2020-12-29 NOTE — Assessment & Plan Note (Signed)
From her description, I suspect she has a component of some reflux I recommend adding Pepcid today

## 2020-12-29 NOTE — Assessment & Plan Note (Signed)
She is eating better I plan to recheck her labs tomorrow

## 2020-12-30 ENCOUNTER — Encounter: Payer: Self-pay | Admitting: Hematology and Oncology

## 2020-12-30 ENCOUNTER — Other Ambulatory Visit: Payer: Self-pay

## 2020-12-30 ENCOUNTER — Inpatient Hospital Stay (HOSPITAL_BASED_OUTPATIENT_CLINIC_OR_DEPARTMENT_OTHER): Payer: 59 | Admitting: Hematology and Oncology

## 2020-12-30 ENCOUNTER — Inpatient Hospital Stay: Payer: 59

## 2020-12-30 ENCOUNTER — Ambulatory Visit: Payer: 59

## 2020-12-30 VITALS — BP 136/57 | HR 58 | Temp 97.5°F | Resp 16

## 2020-12-30 DIAGNOSIS — C541 Malignant neoplasm of endometrium: Secondary | ICD-10-CM

## 2020-12-30 DIAGNOSIS — C7800 Secondary malignant neoplasm of unspecified lung: Secondary | ICD-10-CM

## 2020-12-30 DIAGNOSIS — G893 Neoplasm related pain (acute) (chronic): Secondary | ICD-10-CM

## 2020-12-30 DIAGNOSIS — E876 Hypokalemia: Secondary | ICD-10-CM

## 2020-12-30 DIAGNOSIS — K5909 Other constipation: Secondary | ICD-10-CM | POA: Diagnosis not present

## 2020-12-30 DIAGNOSIS — R112 Nausea with vomiting, unspecified: Secondary | ICD-10-CM

## 2020-12-30 DIAGNOSIS — C778 Secondary and unspecified malignant neoplasm of lymph nodes of multiple regions: Secondary | ICD-10-CM

## 2020-12-30 LAB — CBC WITH DIFFERENTIAL (CANCER CENTER ONLY)
Abs Immature Granulocytes: 0.04 10*3/uL (ref 0.00–0.07)
Basophils Absolute: 0 10*3/uL (ref 0.0–0.1)
Basophils Relative: 0 %
Eosinophils Absolute: 0 10*3/uL (ref 0.0–0.5)
Eosinophils Relative: 0 %
HCT: 33.4 % — ABNORMAL LOW (ref 36.0–46.0)
Hemoglobin: 10.9 g/dL — ABNORMAL LOW (ref 12.0–15.0)
Immature Granulocytes: 1 %
Lymphocytes Relative: 10 %
Lymphs Abs: 0.8 10*3/uL (ref 0.7–4.0)
MCH: 28.8 pg (ref 26.0–34.0)
MCHC: 32.6 g/dL (ref 30.0–36.0)
MCV: 88.4 fL (ref 80.0–100.0)
Monocytes Absolute: 0.9 10*3/uL (ref 0.1–1.0)
Monocytes Relative: 11 %
Neutro Abs: 6.4 10*3/uL (ref 1.7–7.7)
Neutrophils Relative %: 78 %
Platelet Count: 335 10*3/uL (ref 150–400)
RBC: 3.78 MIL/uL — ABNORMAL LOW (ref 3.87–5.11)
RDW: 15.3 % (ref 11.5–15.5)
WBC Count: 8.2 10*3/uL (ref 4.0–10.5)
nRBC: 0 % (ref 0.0–0.2)

## 2020-12-30 LAB — CMP (CANCER CENTER ONLY)
ALT: 7 U/L (ref 0–44)
AST: 11 U/L — ABNORMAL LOW (ref 15–41)
Albumin: 2.9 g/dL — ABNORMAL LOW (ref 3.5–5.0)
Alkaline Phosphatase: 84 U/L (ref 38–126)
Anion gap: 10 (ref 5–15)
BUN: 27 mg/dL — ABNORMAL HIGH (ref 8–23)
CO2: 25 mmol/L (ref 22–32)
Calcium: 8.8 mg/dL — ABNORMAL LOW (ref 8.9–10.3)
Chloride: 101 mmol/L (ref 98–111)
Creatinine: 1.44 mg/dL — ABNORMAL HIGH (ref 0.44–1.00)
GFR, Estimated: 41 mL/min — ABNORMAL LOW (ref >60)
Glucose, Bld: 95 mg/dL (ref 70–99)
Potassium: 3.1 mmol/L — ABNORMAL LOW (ref 3.5–5.1)
Sodium: 136 mmol/L (ref 135–145)
Total Bilirubin: 0.5 mg/dL (ref 0.3–1.2)
Total Protein: 6.3 g/dL — ABNORMAL LOW (ref 6.5–8.1)

## 2020-12-30 LAB — MAGNESIUM: Magnesium: 1.6 mg/dL — ABNORMAL LOW (ref 1.7–2.4)

## 2020-12-30 MED ORDER — SODIUM CHLORIDE 0.9% FLUSH
10.0000 mL | Freq: Once | INTRAVENOUS | Status: AC
  Administered 2020-12-30: 10 mL

## 2020-12-30 MED ORDER — ALTEPLASE 2 MG IJ SOLR
2.0000 mg | Freq: Once | INTRAMUSCULAR | Status: DC | PRN
Start: 2020-12-30 — End: 2020-12-30

## 2020-12-30 MED ORDER — SODIUM CHLORIDE 0.9% FLUSH: 3.0000 mL | Freq: Once | INTRAVENOUS | Status: DC | PRN

## 2020-12-30 MED ORDER — SODIUM CHLORIDE 0.9% FLUSH
10.0000 mL | Freq: Once | INTRAVENOUS | Status: AC | PRN
  Administered 2020-12-30: 10 mL

## 2020-12-30 MED ORDER — HEPARIN SOD (PORK) LOCK FLUSH 100 UNIT/ML IV SOLN
500.0000 [IU] | Freq: Once | INTRAVENOUS | Status: AC | PRN
  Administered 2020-12-30: 500 [IU]

## 2020-12-30 MED ORDER — SODIUM CHLORIDE 0.9 % IV SOLN
40.0000 mg | Freq: Once | INTRAVENOUS | Status: AC
  Administered 2020-12-30: 40 mg via INTRAVENOUS
  Filled 2020-12-30: qty 4

## 2020-12-30 MED ORDER — HEPARIN SOD (PORK) LOCK FLUSH 100 UNIT/ML IV SOLN
250.0000 [IU] | Freq: Once | INTRAVENOUS | Status: DC | PRN
Start: 2020-12-30 — End: 2020-12-30

## 2020-12-30 MED ORDER — HEPARIN SOD (PORK) LOCK FLUSH 100 UNIT/ML IV SOLN: 500.0000 [IU] | Freq: Once | INTRAVENOUS | Status: DC

## 2020-12-30 MED ORDER — SODIUM CHLORIDE 0.9 % IV SOLN
Freq: Once | INTRAVENOUS | Status: AC
Start: 2020-12-30 — End: 2020-12-30

## 2020-12-30 MED ORDER — SODIUM CHLORIDE 0.9% FLUSH: 10.0000 mL | Freq: Once | INTRAVENOUS | Status: DC

## 2020-12-30 MED ORDER — ONDANSETRON HCL 4 MG/2ML IJ SOLN
4.0000 mg | Freq: Once | INTRAMUSCULAR | Status: AC
  Administered 2020-12-30: 4 mg via INTRAVENOUS
  Filled 2020-12-30: qty 2

## 2020-12-30 MED FILL — Famotidine Inj 200 MG/20ML: INTRAVENOUS | Qty: 4 | Status: AC

## 2020-12-30 NOTE — Assessment & Plan Note (Signed)
Her labs are improved Monitor closely and I recommend frequent small meals

## 2020-12-30 NOTE — Assessment & Plan Note (Signed)
She has multifactorial pain I recommend pain medicine as needed As above, she is scheduled for CT imaging for further evaluation

## 2020-12-30 NOTE — Patient Instructions (Signed)

## 2020-12-30 NOTE — Assessment & Plan Note (Signed)
She continues to have severe nausea and vomiting She will continue daily IV fluids and IV Zofran She is taking oral Compazine before meals There is a component of heartburn and she will be getting IV Pepcid She will also continue aggressive laxative therapy to avoid constipation as a contributing factor to her nausea and vomiting

## 2020-12-30 NOTE — Assessment & Plan Note (Signed)
The patient continues to have difficulties tolerating oral intake due to poor appetite, recurrent sensation of heartburn, nausea and changes in bowel habit I am concerned about her wellbeing I recommend the patient to be admitted but she declined She is scheduled for daily IV fluids tomorrow, Monday and next week She is scheduled for CT imaging next week and I will see her back at the end of the week for further follow-up

## 2020-12-30 NOTE — Progress Notes (Signed)
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[facility]) The patient continues to have difficulties tolerating oral intake due to poor appetite, recurrent sensation of heartburn, nausea and changes in bowel habit I am concerned about her wellbeing I recommend the patient to be admitted but she declined She is scheduled for daily IV fluids tomorrow, Monday and next week She is scheduled for CT imaging next week and I will see her back at the end of the week for further follow-up  Cancer associated pain She has multifactorial pain I recommend pain medicine as needed As above, she is scheduled for CT imaging for further evaluation  Nausea with vomiting She continues to have severe nausea and vomiting She will continue daily IV fluids and IV Zofran She is taking oral Compazine before meals There is a component of heartburn and she will be getting IV Pepcid She will also continue aggressive laxative therapy to avoid constipation as a contributing factor to her nausea and vomiting  Other constipation I recommend her to continue taking MiraLAX twice daily and lactulose 3 times daily Will reassess next week  Hypokalemia, inadequate intake Her labs are improved Monitor closely and I recommend frequent small meals  No orders of the defined types were placed in this encounter.   All questions were answered. The patient knows to call the clinic with any problems, questions or concerns. The total time spent in the appointment was 30 minutes encounter with patients including review of chart and various tests results, discussions about plan of care and coordination of care plan   [name], MD 12/30/2020 3:47 PM  INTERVAL HISTORY: Please see below for problem oriented charting. she returns for treatment  follow-up She is seen in the infusion room Since I saw her, she continues to complain of heartburn and poor appetite She has altered taste sensation due to reflux and does not want to eat She ate very little yesterday She tries to Compazine 4 times daily before she eats She has not taken any Zofran According to the patient, the documented blood pressure from home is within normal range in the 130s She continues to have hip pain and abdominal pain that comes and goes Last night, she woke up to spit a lot of her saliva She had 1 small bowel movement yesterday and this morning The patient continues to decline admission  REVIEW OF SYSTEMS:   Constitutional: Denies fevers, chills or abnormal weight loss Eyes: Denies blurriness of vision Ears, nose, mouth, throat, and face: Denies mucositis or sore throat Respiratory: Denies cough, dyspnea or wheezes Cardiovascular: Denies palpitation, chest discomfort or lower extremity swelling Skin: Denies abnormal skin rashes Lymphatics: Denies new lymphadenopathy or easy bruising Behavioral/Psych: Mood is stable, no new changes  All other systems were reviewed with the patient and are negative.  I have reviewed the past medical history, past surgical history, social history and family history with the patient and they are unchanged from previous note.  ALLERGIES:  has No Known Allergies.  MEDICATIONS:  Current Outpatient Medications  Medication Sig Dispense Refill   amLODipine (NORVASC) 10 MG tablet TAKE 1 TABLET(10 MG) BY MOUTH DAILY 30 tablet 1   atenolol (TENORMIN) 50 MG tablet Take 1 tablet (50 mg total) by mouth daily. 30 tablet 1   chlorhexidine (PERIDEX) 0.12 % solution 15 mLs 2 (two)  times daily.     lidocaine-prilocaine (EMLA) cream Apply to affected area once 30 g 3   losartan (COZAAR) 100 MG tablet Take 1 tablet (100 mg total) by mouth daily. 90 tablet 1   magnesium oxide (MAG-OX) 400 (240 Mg) MG tablet Take 1 tablet (400 mg total) by  mouth daily. 30 tablet 1   ondansetron (ZOFRAN) 8 MG tablet Take 1 tablet (8 mg total) by mouth every 8 (eight) hours as needed for refractory nausea / vomiting. Start on day 3 after carboplatin chemo. 30 tablet 1   oxyCODONE 10 MG TABS Take 1 tablet (10 mg total) by mouth every 4 (four) hours as needed for severe pain. 60 tablet 0   polyethylene glycol (MIRALAX / GLYCOLAX) 17 g packet Take 17 g by mouth daily.     prochlorperazine (COMPAZINE) 10 MG tablet TAKE 1 TABLET(10 MG) BY MOUTH EVERY 6 HOURS AS NEEDED FOR NAUSEA OR VOMITING 90 tablet 1   No current facility-administered medications for this visit.    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     02/26/2020 Imaging   1. 3 mm nonobstructing renal stone within the left kidney. 2. Findings suggestive of a small hemorrhagic cyst within the lower pole of the right kidney, with an anterior right lower pole heterogeneous renal soft tissue mass. MRI correlation is recommended, as an underlying neoplastic process cannot be excluded. 3. Colonic diverticulosis. 4. Multiple exophytic uterine fibroids. 5. Evidence of prior cholecystectomy.   03/28/2020 Imaging   2.4 cm enhancing mass in the anterior right lower kidney, suspicious for solid renal neoplasm such as renal cell carcinoma.   Single right renal artery and vein.  No renal vein invasion.   Small retroperitoneal nodes, measuring up to 9 mm short axis, mildly prominent but technically within the upper limits of normal. Attention on follow-up is suggested.   4 x 5 mm nodule in the medial right lower lobe. This is nonspecific and unlikely to reflect a metastasis but warrants attention on follow-up. Consider dedicated CT chest in 3-6 months.   3 mm nonobstructing left lower pole  renal calculus. No hydronephrosis.     04/04/2020 Imaging   1. 10 mm endometrial stripe thickness. This is considered abnormal in a postmenopausal female with bleeding. In the setting of post-menopausal bleeding, endometrial sampling is indicated to exclude carcinoma.  2. Multiple uterine fibroids including potential submucosal fibroid in the lower uterine segment.   04/19/2020 Pathology Results   A. ENDOMETRIUM, BIOPSY:  -  High-grade carcinoma  Immunohistochemical and morphometric analysis performed manually   The tumor cells are POSITIVE for Her2 (3+).    04/19/2020 Procedure   She was seen by Dr. Astrid Drafts on 04/19/2020.  At that visit her cervix was noted to be nodular and firm and abnormal appearing.  Pap taken at that visit was positive for adenocarcinoma, endometrial.  An endometrial P[place] biopsy was taken at the same visit which revealed high-grade endometrial cancer, suspicious for serous carcinoma.     05/12/2020 Cancer Staging   Staging form: Corpus Uteri - Carcinoma and Carcinosarcoma, AJCC 8th Edition - Clinical stage from 05/12/2020: FIGO Stage IVB (cT3, cN2, cM1) - Signed by [name], MD on 05/31/2020   05/30/2020 PET scan   1. Hypermetabolic lymph nodes identified in the left supraclavicular region/thoracic inlet,  mediastinum, hilar regions, right retrocrural space, para-aortic retroperitoneum, and bilateral pelvic sidewall, compatible with metastatic disease. 2. Moderate volume ascites on this noncontrast CT study largely obscures peritoneal and omental disease although hypermetabolic plaque-like uptake in the anterior high abdomen is probably peritoneal with relatively bulky hypermetabolism in the omentum. These findings are also compatible with metastatic disease. 3. Hypermetabolic left upper lobe pulmonary nodule consistent with metastatic disease. Lung primary is considered less likely but not excluded. 4. Scattered areas of focal marrow uptake are suspicious for bony  metastases although no underlying abnormality is evident on CT imaging. 5. Diffuse uptake in the uterine anatomy compatible with the patient's known history of endometrial carcinoma. 6. As above, moderate volume ascites is new in the interval. 7. Enhancing lesion lower pole right kidney seen on previous CT of 03/28/2020 is less conspicuous on today's noncontrast CT imaging. Diffuse normal background FDG accumulation could obscure hypermetabolism in this lesion previously characterized as suspicious for renal cell carcinoma. 8. Bibasilar collapse/consolidation in the lower lobes.   06/01/2020 Procedure   Ultrasound and fluoroscopically guided right internal jugular single lumen power port catheter insertion. Tip in the SVC/RA junction. Catheter ready for use.   06/02/2020 Echocardiogram    1. GLS -18.2%.  2. Left ventricular ejection fraction, by estimation, is 60 to 65%. The left ventricle has normal function. The left ventricle has no regional wall motion abnormalities. There is mild left ventricular hypertrophy. Left ventricular diastolic parameters are consistent with Grade I diastolic dysfunction (impaired relaxation).  3. Right ventricular systolic function is normal. The right ventricular size is normal.  4. The mitral valve is normal in structure. No evidence of mitral valve regurgitation. No evidence of mitral stenosis.  5. The aortic valve is tricuspid. Aortic valve regurgitation is not visualized. No aortic stenosis is present.  6. The inferior vena cava is normal in size with greater than 50% respiratory variability, suggesting right atrial pressure of 3 mmHg.     06/03/2020 -  Chemotherapy    Patient is on Treatment Plan: UTERINE SEROUS CARCINOMA CARBOPLATIN + PACLITAXEL + TRASTUZUMAB Q21D X 6 CYCLES / TRASTUZUMAB Q21D       07/25/2020 Imaging   1. Interval response to therapy. There has been interval decrease in size of mediastinal, retrocrural, retroperitoneal, and left pelvic  sidewall lymph nodes. 2. Decreased volume of ascites. Interval decrease in thickness of omental cake secondary to peritoneal disease. 3. Decrease in size of FDG avid tumor within the umbilicus. 4. Decrease in size of left upper lobe lung nodule. 5. Persistent left-sided hydronephrosis and hydroureter. No obstructing stone identified. 6. Previously noted solid enhancing lesion arising off the anterior cortex of the inferior pole of the right kidney is again noted and remains worrisome for renal cell carcinoma. 7. Aortic atherosclerosis.     08/08/2020 Echocardiogram    1. Left ventricular ejection fraction, by estimation, is 65 to 70%. The left ventricle has normal function. The left ventricle has no regional wall motion abnormalities. There is mild concentric left ventricular hypertrophy of the basal-septal segment. Left ventricular diastolic parameters are consistent with Grade I diastolic dysfunction (impaired relaxation). The average left ventricular global longitudinal strain is -18.5 %. The global longitudinal strain is normal.  2. Right ventricular systolic function is normal. The right ventricular size is normal.  3. The mitral valve is normal in structure. No evidence of mitral valve regurgitation.  4. The aortic valve is tricuspid. Aortic valve regurgitation is not visualized. No aortic stenosis is present.  5.  The inferior vena cava is normal in size with greater than 50% respiratory variability, suggesting right atrial pressure of 3 mmHg.   Comparison(s): A prior study was performed on 06/02/2020. No significant change from prior study. Prior images reviewed side by side. Similar LV deformation and strain and improved loading conditions (improved blood pressure).   11/03/2020 Imaging   1. Overall no significant interval change in exam. 2. Signs of peritoneal carcinomatosis are again noted with perihepatic ascites and omental caking. Not significantly changed in the interval. 3. Slight  decrease in size left supraclavicular and mediastinal lymph nodes. Stable borderline retroperitoneal and left pelvic lymph nodes. 4. Small pulmonary nodules are stable. 5. Stable appearance of solid enhancing lesion arising off the anterior cortex of the inferior pole of right kidney compatible with renal cell carcinoma. 6. Stable appearance of mild left hydronephrosis and left hydroureter to just before the urinary bladder. No obstructing stone identified. 7. Aortic atherosclerosis.   11/16/2020 Echocardiogram    1. Left ventricular ejection fraction, by estimation, is 55 to 60%. The left ventricle has normal function. The left ventricle has no regional wall motion abnormalities. There is moderate left ventricular hypertrophy. Left ventricular diastolic parameters are consistent with Grade I diastolic dysfunction (impaired relaxation). The average left ventricular global longitudinal strain is -18.0 %. The global longitudinal strain is normal.  2. Right ventricular systolic function is normal. The right ventricular size is normal. Tricuspid regurgitation signal is inadequate for assessing PA pressure.  3. The mitral valve is normal in structure. No evidence of mitral valve regurgitation. No evidence of mitral stenosis.  4. The aortic valve is tricuspid. Aortic valve regurgitation is not visualized. Mild aortic valve sclerosis is present, with no evidence of aortic valve stenosis.  5. The inferior vena cava is normal in size with greater than 50% respiratory variability, suggesting right atrial pressure of 3 mmHg.     Metastasis to lymph nodes ([place])  05/31/2020 Initial Diagnosis   Metastasis to lymph nodes ([place])   06/03/2020 -  Chemotherapy    Patient is on Treatment Plan: UTERINE SEROUS CARCINOMA CARBOPLATIN + PACLITAXEL + TRASTUZUMAB Q21D X 6 CYCLES / TRASTUZUMAB Q21D       Metastasis to lung ([place])  05/31/2020 Initial Diagnosis   Metastasis to lung ([place])   06/03/2020 -  Chemotherapy     Patient is on Treatment Plan: UTERINE SEROUS CARCINOMA CARBOPLATIN + PACLITAXEL + TRASTUZUMAB Q21D X 6 CYCLES / TRASTUZUMAB Q21D         PHYSICAL EXAMINATION: ECOG PERFORMANCE STATUS: 2 - Symptomatic, <50% confined to bed GENERAL:alert, no distress and comfortable NEURO: alert & oriented x 3 with fluent speech, no focal motor/sensory deficits  LABORATORY DATA:  I have reviewed the data as listed    Component Value Date/Time   NA 136 12/30/2020 1407   NA 138 02/23/2020 1113   K 3.1 (L) 12/30/2020 1407   CL 101 12/30/2020 1407   CO2 25 12/30/2020 1407   GLUCOSE 95 12/30/2020 1407   BUN 27 (H) 12/30/2020 1407   BUN 15 02/23/2020 1113   CREATININE 1.44 (H) 12/30/2020 1407   CALCIUM 8.8 (L) 12/30/2020 1407   PROT 6.3 (L) 12/30/2020 1407   PROT 7.8 02/23/2020 1113   ALBUMIN 2.9 (L) 12/30/2020 1407   ALBUMIN 4.4 02/23/2020 1113   AST 11 (L) 12/30/2020 1407   ALT 7 12/30/2020 1407   ALKPHOS 84 12/30/2020 1407   BILITOT 0.5 12/30/2020 1407   GFRNONAA 41 (L) 12/30/2020 1407  GFRAA 91 02/23/2020 1113    No results found for: SPEP, UPEP  Lab Results  Component Value Date   WBC 8.2 12/30/2020   NEUTROABS 6.4 12/30/2020   HGB 10.9 (L) 12/30/2020   HCT 33.4 (L) 12/30/2020   MCV 88.4 12/30/2020   PLT 335 12/30/2020      Chemistry      Component Value Date/Time   NA 136 12/30/2020 1407   NA 138 02/23/2020 1113   K 3.1 (L) 12/30/2020 1407   CL 101 12/30/2020 1407   CO2 25 12/30/2020 1407   BUN 27 (H) 12/30/2020 1407   BUN 15 02/23/2020 1113   CREATININE 1.44 (H) 12/30/2020 1407      Component Value Date/Time   CALCIUM 8.8 (L) 12/30/2020 1407   ALKPHOS 84 12/30/2020 1407   AST 11 (L) 12/30/2020 1407   ALT 7 12/30/2020 1407   BILITOT 0.5 12/30/2020 1407

## 2020-12-30 NOTE — Assessment & Plan Note (Signed)
I recommend her to continue taking MiraLAX twice daily and lactulose 3 times daily Will reassess next week

## 2020-12-31 ENCOUNTER — Inpatient Hospital Stay: Payer: 59

## 2020-12-31 VITALS — BP 133/68 | HR 57 | Temp 98.5°F | Resp 18

## 2020-12-31 DIAGNOSIS — C541 Malignant neoplasm of endometrium: Secondary | ICD-10-CM

## 2020-12-31 DIAGNOSIS — D539 Nutritional anemia, unspecified: Secondary | ICD-10-CM

## 2020-12-31 DIAGNOSIS — C7800 Secondary malignant neoplasm of unspecified lung: Secondary | ICD-10-CM

## 2020-12-31 DIAGNOSIS — C778 Secondary and unspecified malignant neoplasm of lymph nodes of multiple regions: Secondary | ICD-10-CM

## 2020-12-31 MED ORDER — ONDANSETRON HCL 4 MG/2ML IJ SOLN
INTRAMUSCULAR | Status: AC
  Filled 2020-12-31: qty 2

## 2020-12-31 MED ORDER — HEPARIN SOD (PORK) LOCK FLUSH 100 UNIT/ML IV SOLN
500.0000 [IU] | Freq: Every day | INTRAVENOUS | Status: AC | PRN
  Administered 2020-12-31: 500 [IU]

## 2020-12-31 MED ORDER — ONDANSETRON HCL 4 MG/2ML IJ SOLN
4.0000 mg | Freq: Once | INTRAMUSCULAR | Status: AC
  Administered 2020-12-31: 4 mg via INTRAVENOUS

## 2020-12-31 MED ORDER — SODIUM CHLORIDE 0.9 % IV SOLN
40.0000 mg | Freq: Once | INTRAVENOUS | Status: AC
  Administered 2020-12-31: 40 mg via INTRAVENOUS

## 2020-12-31 MED ORDER — SODIUM CHLORIDE 0.9 % IV SOLN: Freq: Once | INTRAVENOUS | Status: AC

## 2020-12-31 MED ORDER — SODIUM CHLORIDE 0.9 % IV SOLN: 40.0000 mg | Freq: Once | INTRAVENOUS | Status: DC

## 2020-12-31 MED ORDER — SODIUM CHLORIDE 0.9% FLUSH
3.0000 mL | INTRAVENOUS | Status: AC | PRN
  Administered 2020-12-31: 3 mL

## 2020-12-31 NOTE — Patient Instructions (Signed)

## 2021-01-02 ENCOUNTER — Other Ambulatory Visit: Payer: Self-pay

## 2021-01-02 ENCOUNTER — Ambulatory Visit: Payer: 59

## 2021-01-02 ENCOUNTER — Telehealth: Payer: Self-pay

## 2021-01-02 ENCOUNTER — Observation Stay (HOSPITAL_COMMUNITY): Payer: 59

## 2021-01-02 ENCOUNTER — Emergency Department (HOSPITAL_COMMUNITY): Payer: 59

## 2021-01-02 ENCOUNTER — Encounter (HOSPITAL_COMMUNITY): Payer: Self-pay | Admitting: *Deleted

## 2021-01-02 ENCOUNTER — Inpatient Hospital Stay (HOSPITAL_COMMUNITY)
Admission: EM | Admit: 2021-01-02 | Discharge: 2021-01-19 | DRG: 754 | Disposition: A | Discharge status: Skilled Nursing Facility | Payer: 59 | Attending: Internal Medicine | Admitting: Internal Medicine

## 2021-01-02 ENCOUNTER — Ambulatory Visit: Payer: 59 | Admitting: Hematology and Oncology

## 2021-01-02 DIAGNOSIS — N179 Acute kidney failure, unspecified: Secondary | ICD-10-CM | POA: Diagnosis present

## 2021-01-02 DIAGNOSIS — E876 Hypokalemia: Secondary | ICD-10-CM | POA: Diagnosis not present

## 2021-01-02 DIAGNOSIS — Z515 Encounter for palliative care: Secondary | ICD-10-CM

## 2021-01-02 DIAGNOSIS — R112 Nausea with vomiting, unspecified: Secondary | ICD-10-CM | POA: Diagnosis not present

## 2021-01-02 DIAGNOSIS — E1122 Type 2 diabetes mellitus with diabetic chronic kidney disease: Secondary | ICD-10-CM | POA: Diagnosis present

## 2021-01-02 DIAGNOSIS — E875 Hyperkalemia: Secondary | ICD-10-CM | POA: Diagnosis not present

## 2021-01-02 DIAGNOSIS — N390 Urinary tract infection, site not specified: Secondary | ICD-10-CM

## 2021-01-02 DIAGNOSIS — C7800 Secondary malignant neoplasm of unspecified lung: Secondary | ICD-10-CM

## 2021-01-02 DIAGNOSIS — F419 Anxiety disorder, unspecified: Secondary | ICD-10-CM | POA: Diagnosis present

## 2021-01-02 DIAGNOSIS — R140 Abdominal distension (gaseous): Secondary | ICD-10-CM

## 2021-01-02 DIAGNOSIS — D61810 Antineoplastic chemotherapy induced pancytopenia: Secondary | ICD-10-CM | POA: Diagnosis not present

## 2021-01-02 DIAGNOSIS — R188 Other ascites: Secondary | ICD-10-CM | POA: Diagnosis present

## 2021-01-02 DIAGNOSIS — N1831 Chronic kidney disease, stage 3a: Secondary | ICD-10-CM

## 2021-01-02 DIAGNOSIS — K219 Gastro-esophageal reflux disease without esophagitis: Secondary | ICD-10-CM | POA: Diagnosis present

## 2021-01-02 DIAGNOSIS — Z20822 Contact with and (suspected) exposure to covid-19: Secondary | ICD-10-CM | POA: Diagnosis present

## 2021-01-02 DIAGNOSIS — N136 Pyonephrosis: Secondary | ICD-10-CM | POA: Diagnosis present

## 2021-01-02 DIAGNOSIS — C779 Secondary and unspecified malignant neoplasm of lymph node, unspecified: Secondary | ICD-10-CM | POA: Diagnosis present

## 2021-01-02 DIAGNOSIS — Z7189 Other specified counseling: Secondary | ICD-10-CM | POA: Diagnosis not present

## 2021-01-02 DIAGNOSIS — N139 Obstructive and reflux uropathy, unspecified: Secondary | ICD-10-CM

## 2021-01-02 DIAGNOSIS — Z9851 Tubal ligation status: Secondary | ICD-10-CM

## 2021-01-02 DIAGNOSIS — Z6841 Body Mass Index (BMI) 40.0 and over, adult: Secondary | ICD-10-CM

## 2021-01-02 DIAGNOSIS — Z0189 Encounter for other specified special examinations: Secondary | ICD-10-CM

## 2021-01-02 DIAGNOSIS — D61818 Other pancytopenia: Secondary | ICD-10-CM | POA: Diagnosis not present

## 2021-01-02 DIAGNOSIS — C541 Malignant neoplasm of endometrium: Secondary | ICD-10-CM | POA: Diagnosis not present

## 2021-01-02 DIAGNOSIS — E669 Obesity, unspecified: Secondary | ICD-10-CM | POA: Diagnosis present

## 2021-01-02 DIAGNOSIS — R630 Anorexia: Secondary | ICD-10-CM | POA: Diagnosis present

## 2021-01-02 DIAGNOSIS — N1330 Unspecified hydronephrosis: Secondary | ICD-10-CM

## 2021-01-02 DIAGNOSIS — D638 Anemia in other chronic diseases classified elsewhere: Secondary | ICD-10-CM | POA: Diagnosis present

## 2021-01-02 DIAGNOSIS — Z8616 Personal history of COVID-19: Secondary | ICD-10-CM | POA: Diagnosis not present

## 2021-01-02 DIAGNOSIS — R12 Heartburn: Secondary | ICD-10-CM | POA: Diagnosis present

## 2021-01-02 DIAGNOSIS — G620 Drug-induced polyneuropathy: Secondary | ICD-10-CM | POA: Diagnosis not present

## 2021-01-02 DIAGNOSIS — Z4659 Encounter for fitting and adjustment of other gastrointestinal appliance and device: Secondary | ICD-10-CM

## 2021-01-02 DIAGNOSIS — K567 Ileus, unspecified: Secondary | ICD-10-CM | POA: Diagnosis not present

## 2021-01-02 DIAGNOSIS — E1165 Type 2 diabetes mellitus with hyperglycemia: Secondary | ICD-10-CM | POA: Diagnosis not present

## 2021-01-02 DIAGNOSIS — Z66 Do not resuscitate: Secondary | ICD-10-CM | POA: Diagnosis not present

## 2021-01-02 DIAGNOSIS — C786 Secondary malignant neoplasm of retroperitoneum and peritoneum: Secondary | ICD-10-CM | POA: Diagnosis present

## 2021-01-02 DIAGNOSIS — R0602 Shortness of breath: Secondary | ICD-10-CM

## 2021-01-02 DIAGNOSIS — D709 Neutropenia, unspecified: Secondary | ICD-10-CM | POA: Diagnosis not present

## 2021-01-02 DIAGNOSIS — R600 Localized edema: Secondary | ICD-10-CM | POA: Diagnosis present

## 2021-01-02 DIAGNOSIS — I129 Hypertensive chronic kidney disease with stage 1 through stage 4 chronic kidney disease, or unspecified chronic kidney disease: Secondary | ICD-10-CM | POA: Diagnosis present

## 2021-01-02 DIAGNOSIS — N3001 Acute cystitis with hematuria: Secondary | ICD-10-CM | POA: Diagnosis not present

## 2021-01-02 DIAGNOSIS — K56609 Unspecified intestinal obstruction, unspecified as to partial versus complete obstruction: Secondary | ICD-10-CM | POA: Diagnosis not present

## 2021-01-02 DIAGNOSIS — G893 Neoplasm related pain (acute) (chronic): Secondary | ICD-10-CM | POA: Diagnosis present

## 2021-01-02 DIAGNOSIS — Z808 Family history of malignant neoplasm of other organs or systems: Secondary | ICD-10-CM

## 2021-01-02 DIAGNOSIS — E43 Unspecified severe protein-calorie malnutrition: Secondary | ICD-10-CM

## 2021-01-02 DIAGNOSIS — C778 Secondary and unspecified malignant neoplasm of lymph nodes of multiple regions: Secondary | ICD-10-CM

## 2021-01-02 DIAGNOSIS — M25569 Pain in unspecified knee: Secondary | ICD-10-CM

## 2021-01-02 DIAGNOSIS — Z79899 Other long term (current) drug therapy: Secondary | ICD-10-CM

## 2021-01-02 DIAGNOSIS — T451X5A Adverse effect of antineoplastic and immunosuppressive drugs, initial encounter: Secondary | ICD-10-CM | POA: Diagnosis not present

## 2021-01-02 LAB — URINALYSIS, ROUTINE W REFLEX MICROSCOPIC
Bilirubin Urine: NEGATIVE
Glucose, UA: NEGATIVE mg/dL
Ketones, ur: 5 mg/dL — AB
Nitrite: NEGATIVE
Protein, ur: 100 mg/dL — AB
RBC / HPF: >50 RBC/hpf — ABNORMAL HIGH (ref 0–5)
Specific Gravity, Urine: 1.015 (ref 1.005–1.030)
WBC, UA: >50 WBC/hpf — ABNORMAL HIGH (ref 0–5)
pH: 5 (ref 5.0–8.0)

## 2021-01-02 LAB — POC OCCULT BLOOD, ED: Fecal Occult Bld: NEGATIVE

## 2021-01-02 LAB — COMPREHENSIVE METABOLIC PANEL
ALT: 13 U/L (ref 0–44)
AST: 14 U/L — ABNORMAL LOW (ref 15–41)
Albumin: 2.9 g/dL — ABNORMAL LOW (ref 3.5–5.0)
Alkaline Phosphatase: 81 U/L (ref 38–126)
Anion gap: 11 (ref 5–15)
BUN: 34 mg/dL — ABNORMAL HIGH (ref 8–23)
CO2: 20 mmol/L — ABNORMAL LOW (ref 22–32)
Calcium: 8.5 mg/dL — ABNORMAL LOW (ref 8.9–10.3)
Chloride: 101 mmol/L (ref 98–111)
Creatinine, Ser: 1.3 mg/dL — ABNORMAL HIGH (ref 0.44–1.00)
GFR, Estimated: 46 mL/min — ABNORMAL LOW (ref >60)
Glucose, Bld: 105 mg/dL — ABNORMAL HIGH (ref 70–99)
Potassium: 3.1 mmol/L — ABNORMAL LOW (ref 3.5–5.1)
Sodium: 132 mmol/L — ABNORMAL LOW (ref 135–145)
Total Bilirubin: 1 mg/dL (ref 0.3–1.2)
Total Protein: 6.4 g/dL — ABNORMAL LOW (ref 6.5–8.1)

## 2021-01-02 LAB — CBC
HCT: 36.4 % (ref 36.0–46.0)
Hemoglobin: 11.8 g/dL — ABNORMAL LOW (ref 12.0–15.0)
MCH: 29.2 pg (ref 26.0–34.0)
MCHC: 32.4 g/dL (ref 30.0–36.0)
MCV: 90.1 fL (ref 80.0–100.0)
Platelets: 359 10*3/uL (ref 150–400)
RBC: 4.04 MIL/uL (ref 3.87–5.11)
RDW: 15.6 % — ABNORMAL HIGH (ref 11.5–15.5)
WBC: 8.8 10*3/uL (ref 4.0–10.5)
nRBC: 0 % (ref 0.0–0.2)

## 2021-01-02 LAB — HIV ANTIBODY (ROUTINE TESTING W REFLEX): HIV Screen 4th Generation wRfx: NONREACTIVE

## 2021-01-02 LAB — MAGNESIUM: Magnesium: 1.5 mg/dL — ABNORMAL LOW (ref 1.7–2.4)

## 2021-01-02 LAB — LIPASE, BLOOD: Lipase: 24 U/L (ref 11–51)

## 2021-01-02 MED ORDER — HYDROCODONE-ACETAMINOPHEN 5-325 MG PO TABS: 1.0000 | ORAL_TABLET | ORAL | Status: DC | PRN

## 2021-01-02 MED ORDER — IOHEXOL 350 MG/ML SOLN
100.0000 mL | Freq: Once | INTRAVENOUS | Status: AC | PRN
  Administered 2021-01-02: 80 mL via INTRAVENOUS

## 2021-01-02 MED ORDER — MORPHINE SULFATE (PF) 2 MG/ML IV SOLN
2.0000 mg | INTRAVENOUS | Status: DC | PRN
  Administered 2021-01-02 – 2021-01-08 (×36): 2 mg via INTRAVENOUS
  Filled 2021-01-02 (×36): qty 1

## 2021-01-02 MED ORDER — SODIUM CHLORIDE 0.9 % IV SOLN: 1.0000 g | Freq: Two times a day (BID) | INTRAVENOUS | Status: DC

## 2021-01-02 MED ORDER — ENOXAPARIN SODIUM 40 MG/0.4ML IJ SOSY
40.0000 mg | PREFILLED_SYRINGE | INTRAMUSCULAR | Status: DC
Start: 2021-01-02 — End: 2021-01-18
  Administered 2021-01-02 – 2021-01-17 (×16): 40 mg via SUBCUTANEOUS
  Filled 2021-01-02 (×16): qty 0.4

## 2021-01-02 MED ORDER — PROCHLORPERAZINE EDISYLATE 10 MG/2ML IJ SOLN
10.0000 mg | Freq: Four times a day (QID) | INTRAMUSCULAR | Status: DC | PRN
  Administered 2021-01-02 – 2021-01-18 (×22): 10 mg via INTRAVENOUS
  Filled 2021-01-02 (×23): qty 2

## 2021-01-02 MED ORDER — ACETAMINOPHEN 650 MG RE SUPP: 650.0000 mg | Freq: Four times a day (QID) | RECTAL | Status: DC | PRN

## 2021-01-02 MED ORDER — ONDANSETRON HCL 4 MG PO TABS: 4.0000 mg | ORAL_TABLET | Freq: Four times a day (QID) | ORAL | Status: DC | PRN

## 2021-01-02 MED ORDER — ACETAMINOPHEN 325 MG PO TABS: 650.0000 mg | ORAL_TABLET | Freq: Four times a day (QID) | ORAL | Status: DC | PRN

## 2021-01-02 MED ORDER — IPRATROPIUM-ALBUTEROL 0.5-2.5 (3) MG/3ML IN SOLN
RESPIRATORY_TRACT | Status: AC
  Filled 2021-01-02: qty 3

## 2021-01-02 MED ORDER — PANTOPRAZOLE SODIUM 40 MG IV SOLR
40.0000 mg | Freq: Once | INTRAVENOUS | Status: AC
  Administered 2021-01-02: 40 mg via INTRAVENOUS
  Filled 2021-01-02: qty 40

## 2021-01-02 MED ORDER — SODIUM CHLORIDE 0.9 % IV SOLN
2.0000 g | INTRAVENOUS | Status: AC
  Administered 2021-01-02 – 2021-01-04 (×3): 2 g via INTRAVENOUS
  Filled 2021-01-02 (×3): qty 20

## 2021-01-02 MED ORDER — SODIUM CHLORIDE 0.9 % IV BOLUS (SEPSIS)
1000.0000 mL | Freq: Once | INTRAVENOUS | Status: AC
  Administered 2021-01-02: 1000 mL via INTRAVENOUS

## 2021-01-02 MED ORDER — MORPHINE SULFATE (PF) 4 MG/ML IV SOLN
4.0000 mg | Freq: Once | INTRAVENOUS | Status: AC
  Administered 2021-01-02: 4 mg via INTRAVENOUS
  Filled 2021-01-02: qty 1

## 2021-01-02 MED ORDER — BISACODYL 5 MG PO TBEC: 5.0000 mg | DELAYED_RELEASE_TABLET | Freq: Every day | ORAL | Status: DC | PRN

## 2021-01-02 MED ORDER — ONDANSETRON HCL 4 MG/2ML IJ SOLN
4.0000 mg | Freq: Once | INTRAMUSCULAR | Status: AC
  Administered 2021-01-02: 4 mg via INTRAVENOUS
  Filled 2021-01-02: qty 2

## 2021-01-02 MED ORDER — METOPROLOL TARTRATE 5 MG/5ML IV SOLN
5.0000 mg | Freq: Four times a day (QID) | INTRAVENOUS | Status: DC | PRN
  Administered 2021-01-06 (×2): 5 mg via INTRAVENOUS
  Filled 2021-01-02 (×2): qty 5

## 2021-01-02 MED ORDER — ONDANSETRON HCL 4 MG/2ML IJ SOLN
4.0000 mg | Freq: Four times a day (QID) | INTRAMUSCULAR | Status: DC | PRN
  Administered 2021-01-02 – 2021-01-18 (×24): 4 mg via INTRAVENOUS
  Filled 2021-01-02 (×26): qty 2

## 2021-01-02 MED ORDER — MORPHINE SULFATE (PF) 2 MG/ML IV SOLN
2.0000 mg | INTRAVENOUS | Status: DC | PRN
  Administered 2021-01-02: 2 mg via INTRAVENOUS
  Filled 2021-01-02 (×2): qty 1

## 2021-01-02 MED ORDER — SODIUM CHLORIDE 0.9 % IV SOLN
1000.0000 mL | INTRAVENOUS | Status: DC
  Administered 2021-01-02 – 2021-01-03 (×3): 1000 mL via INTRAVENOUS

## 2021-01-02 MED ORDER — SENNOSIDES-DOCUSATE SODIUM 8.6-50 MG PO TABS: 1.0000 | ORAL_TABLET | Freq: Every evening | ORAL | Status: DC | PRN

## 2021-01-02 NOTE — ED Notes (Signed)
Pt placed on purewick 

## 2021-01-02 NOTE — ED Triage Notes (Addendum)
Pt complains of constipation, emesis, abdominal pain. Cannot keep lactulose or water down. History of cervical cancer, was told tumor was obstructing bowels and has been on laxatives. Receiving chemo, last treatment on 8/27.

## 2021-01-02 NOTE — ED Notes (Signed)
Family at bedside. 

## 2021-01-02 NOTE — Telephone Encounter (Signed)
Called regarding missed appt today. She is having severe abdominal pain and going to the ER at [facility] to be evaluated.

## 2021-01-02 NOTE — Progress Notes (Addendum)
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[facility]) The patient continues to have difficulties tolerating oral intake due to poor appetite, recurrent sensation of heartburn, nausea and changes in bowel habit She has been receiving supportive care with IV fluids in our office and has previously declined admission CT scan performed today consistent with disease progression Will consider palliative chemotherapy in the hospital Friday if condition improves I explained to the patient and her daughter that her condition will likely deteriorate without chemotherapy I prefer to wait at least 3 days to allow IV antibiotics for presumed UTI based on her abnormal urinalysis.  Urine culture is pending If she feels better by Friday morning, I will get her started on carboplatin, paclitaxel and Herceptin Carboplatin and paclitaxel was discontinued after treatment in June due to severe pancytopenia and nadired response seen on CT imaging Technically, she did not progress on carboplatin and paclitaxel We reviewed the NCCN guidelines We discussed the role of chemotherapy. The intent is of palliative intent.  We discussed some of the risks, benefits, side-effects of carboplatin & Taxol. Treatment is intravenous, every 3 weeks   Some of the short term side-effects included, though not limited to, including weight loss, life threatening infections, risk of allergic reactions, need for transfusions of blood products, nausea, vomiting, change in bowel habits, loss of hair, admission to hospital for various reasons, and risks of death.   Long term side-effects are also discussed including risks of infertility, permanent damage  to nerve function, hearing loss, chronic fatigue, kidney damage with possibility needing hemodialysis, and rare secondary malignancy including bone marrow disorders.  The patient is aware that the response rates discussed earlier is not guaranteed.  After a long discussion, patient made an informed decision to proceed with the prescribed plan of care.   Patient education material was dispensed.   Cancer associated pain She has multifactorial pain I recommend pain medicine as needed   Nausea with vomiting/ileus She continues to have severe nausea and vomiting Recommend IV fluids and antiemetics Recommend NG tube placement and follow-up on x ray tomorrow  Hydronephrosis Monitor renal function closely I spoke with urologist We can technically monitor this closely for now  UTI Recommend IV antibiotics for at least 3 days Follow-up on urine culture results    Hypokalemia, inadequate intake Replete per hospital  Goals of care Resolution of ileus, nausea vomiting, and UTI I reviewed palliative intent of treatment with the patient and her daughter and they are willing to try She is aware, if despite chemotherapy, that if she did not improve within 2 to 3 weeks time, her condition will be considered terminal at that point in time  Discharge planning Anticipate that she will be in the hospital for at least 5-7 days The patient is aware that I will return on Friday.  My nurse practitioner will check on the patient the next few days.  Please call if questions arise  [name], NP 01/02/2021 11:46 AM [name], MD  INTERVAL HISTORY: The patient was seen in the emergency department today Her daughter is at the bedside She developed worsening abdominal pain in her upper abdomen, nausea, vomiting, and constipation over the weekend She feels that her abdomen  is more distended at this time Last bowel movement was Saturday which she reports is being very loose She is really unable to  eat or keep in any liquids at this time Reports increased lower extremity swelling She has received pain medication and antiemetics so far and feels somewhat better  REVIEW OF SYSTEMS:   Constitutional: Denies fevers, chills  Eyes: Denies blurriness of vision Ears, nose, mouth, throat, and face: Denies mucositis or sore throat Respiratory: Denies cough, dyspnea or wheezes Cardiovascular: Denies palpitation, chest discomfort, reports increased lower extremity swelling Skin: Denies abnormal skin rashes Lymphatics: Denies new lymphadenopathy or easy bruising Behavioral/Psych: Mood is stable, no new changes  All other systems were reviewed with the patient and are negative.  I have reviewed the past medical history, past surgical history, social history and family history with the patient and they are unchanged from previous note.  ALLERGIES:  has No Known Allergies.  MEDICATIONS:  Current Facility-Administered Medications  Medication Dose Route Frequency Provider Last Rate Last Admin   0.9 %  sodium chloride infusion  1,000 mL Intravenous Continuous Dorie Rank, MD 125 mL/hr at 01/02/21 1054 1,000 mL at 01/02/21 1054   ipratropium-albuterol (DUONEB) 0.5-2.5 (3) MG/3ML nebulizer solution            Current Outpatient Medications  Medication Sig Dispense Refill   amLODipine (NORVASC) 10 MG tablet TAKE 1 TABLET(10 MG) BY MOUTH DAILY 30 tablet 1   atenolol (TENORMIN) 50 MG tablet Take 1 tablet (50 mg total) by mouth daily. 30 tablet 1   chlorhexidine (PERIDEX) 0.12 % solution 15 mLs 2 (two) times daily.     lidocaine-prilocaine (EMLA) cream Apply to affected area once 30 g 3   losartan (COZAAR) 100 MG tablet Take 1 tablet (100 mg total) by mouth daily. 90 tablet 1   magnesium oxide (MAG-OX) 400 (240 Mg) MG tablet Take 1 tablet (400 mg total) by mouth daily. 30 tablet 1   ondansetron (ZOFRAN) 8 MG tablet Take 1 tablet (8 mg total) by mouth every 8 (eight) hours as needed for refractory nausea  / vomiting. Start on day 3 after carboplatin chemo. 30 tablet 1   oxyCODONE 10 MG TABS Take 1 tablet (10 mg total) by mouth every 4 (four) hours as needed for severe pain. 60 tablet 0   polyethylene glycol (MIRALAX / GLYCOLAX) 17 g packet Take 17 g by mouth daily.     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     02/26/2020 Imaging   1. 3 mm nonobstructing renal stone within the left kidney. 2. Findings suggestive of a small hemorrhagic cyst within the lower pole of the right kidney, with an anterior right lower pole heterogeneous renal soft tissue mass. MRI correlation is recommended, as an underlying neoplastic process cannot be excluded. 3. Colonic diverticulosis. 4. Multiple exophytic uterine fibroids. 5. Evidence of prior cholecystectomy.   03/28/2020 Imaging   2.4 cm enhancing mass in the anterior right lower  kidney, suspicious for solid renal neoplasm such as renal cell carcinoma.   Single right renal artery and vein.  No renal vein invasion.   Small retroperitoneal nodes, measuring up to 9 mm short axis, mildly prominent but technically within the upper limits of normal. Attention on follow-up is suggested.   4 x 5 mm nodule in the medial right lower lobe. This is nonspecific and unlikely to reflect a metastasis but warrants attention on follow-up. Consider dedicated CT chest in 3-6 months.   3 mm nonobstructing left lower pole renal calculus. No hydronephrosis.      04/04/2020 Imaging   1. 10 mm endometrial stripe thickness. This is considered abnormal in a postmenopausal female with bleeding. In the setting of post-menopausal bleeding, endometrial sampling is indicated to exclude carcinoma.  2. Multiple uterine fibroids including potential submucosal fibroid in the lower uterine segment.   04/19/2020 Pathology Results   A. ENDOMETRIUM, BIOPSY:  -  High-grade carcinoma  Immunohistochemical and morphometric analysis performed manually   The tumor cells are POSITIVE for Her2 (3+).    04/19/2020 Procedure   She was seen by Dr. Astrid Drafts on 04/19/2020.  At that visit her cervix was noted to be nodular and firm and abnormal appearing.  Pap taken at that visit was positive for adenocarcinoma, endometrial.  An endometrial Pip[place] biopsy was taken at the same visit which revealed high-grade endometrial cancer, suspicious for serous carcinoma.     05/12/2020 Cancer Staging   Staging form: Corpus Uteri - Carcinoma and Carcinosarcoma, AJCC 8th Edition - Clinical stage from 05/12/2020: FIGO Stage IVB (cT3, cN2, cM1) - Signed by [name], MD on 05/31/2020   05/30/2020 PET scan   1. Hypermetabolic lymph nodes identified in the left supraclavicular region/thoracic inlet, mediastinum, hilar regions, right retrocrural space, para-aortic retroperitoneum, and bilateral pelvic sidewall, compatible with metastatic disease. 2. Moderate volume ascites on this noncontrast CT study largely obscures peritoneal and omental disease although hypermetabolic plaque-like uptake in the anterior high abdomen is probably peritoneal with relatively bulky hypermetabolism in the omentum. These findings are also compatible with metastatic disease. 3. Hypermetabolic left upper lobe pulmonary nodule consistent with metastatic disease. Lung primary is considered less likely but not excluded. 4. Scattered areas of focal marrow uptake are suspicious for bony metastases although no underlying  abnormality is evident on CT imaging. 5. Diffuse uptake in the uterine anatomy compatible with the patient's known history of endometrial carcinoma. 6. As above, moderate volume ascites is new in the interval. 7. Enhancing lesion lower pole right kidney seen on previous CT of 03/28/2020 is less conspicuous on today's noncontrast CT imaging. Diffuse normal background FDG accumulation could obscure hypermetabolism in this lesion previously characterized as suspicious for renal cell carcinoma. 8. Bibasilar collapse/consolidation in the lower lobes.   06/01/2020 Procedure   Ultrasound and fluoroscopically guided right internal jugular single lumen power port catheter insertion. Tip in the SVC/RA junction. Catheter ready for use.   06/02/2020 Echocardiogram    1. GLS -18.2%.  2. Left ventricular ejection fraction, by estimation, is 60 to 65%. The left ventricle has normal function. The left ventricle has no regional wall motion abnormalities. There is mild left ventricular hypertrophy. Left ventricular diastolic parameters are consistent with Grade I diastolic dysfunction (impaired relaxation).  3. Right ventricular systolic function is normal. The right ventricular size is normal.  4. The mitral valve is normal in structure. No evidence of mitral valve regurgitation. No evidence of mitral stenosis.  5. The aortic valve is tricuspid. Aortic  valve regurgitation is not visualized. No aortic stenosis is present.  6. The inferior vena cava is normal in size with greater than 50% respiratory variability, suggesting right atrial pressure of 3 mmHg.     06/03/2020 -  Chemotherapy    Patient is on Treatment Plan: UTERINE SEROUS CARCINOMA CARBOPLATIN + PACLITAXEL + TRASTUZUMAB Q21D X 6 CYCLES / TRASTUZUMAB Q21D       07/25/2020 Imaging   1. Interval response to therapy. There has been interval decrease in size of mediastinal, retrocrural, retroperitoneal, and left pelvic sidewall lymph nodes. 2. Decreased  volume of ascites. Interval decrease in thickness of omental cake secondary to peritoneal disease. 3. Decrease in size of FDG avid tumor within the umbilicus. 4. Decrease in size of left upper lobe lung nodule. 5. Persistent left-sided hydronephrosis and hydroureter. No obstructing stone identified. 6. Previously noted solid enhancing lesion arising off the anterior cortex of the inferior pole of the right kidney is again noted and remains worrisome for renal cell carcinoma. 7. Aortic atherosclerosis.     08/08/2020 Echocardiogram    1. Left ventricular ejection fraction, by estimation, is 65 to 70%. The left ventricle has normal function. The left ventricle has no regional wall motion abnormalities. There is mild concentric left ventricular hypertrophy of the basal-septal segment. Left ventricular diastolic parameters are consistent with Grade I diastolic dysfunction (impaired relaxation). The average left ventricular global longitudinal strain is -18.5 %. The global longitudinal strain is normal.  2. Right ventricular systolic function is normal. The right ventricular size is normal.  3. The mitral valve is normal in structure. No evidence of mitral valve regurgitation.  4. The aortic valve is tricuspid. Aortic valve regurgitation is not visualized. No aortic stenosis is present.  5. The inferior vena cava is normal in size with greater than 50% respiratory variability, suggesting right atrial pressure of 3 mmHg.   Comparison(s): A prior study was performed on 06/02/2020. No significant change from prior study. Prior images reviewed side by side. Similar LV deformation and strain and improved loading conditions (improved blood pressure).   11/03/2020 Imaging   1. Overall no significant interval change in exam. 2. Signs of peritoneal carcinomatosis are again noted with perihepatic ascites and omental caking. Not significantly changed in the interval. 3. Slight decrease in size left supraclavicular  and mediastinal lymph nodes. Stable borderline retroperitoneal and left pelvic lymph nodes. 4. Small pulmonary nodules are stable. 5. Stable appearance of solid enhancing lesion arising off the anterior cortex of the inferior pole of right kidney compatible with renal cell carcinoma. 6. Stable appearance of mild left hydronephrosis and left hydroureter to just before the urinary bladder. No obstructing stone identified. 7. Aortic atherosclerosis.   11/16/2020 Echocardiogram    1. Left ventricular ejection fraction, by estimation, is 55 to 60%. The left ventricle has normal function. The left ventricle has no regional wall motion abnormalities. There is moderate left ventricular hypertrophy. Left ventricular diastolic parameters are consistent with Grade I diastolic dysfunction (impaired relaxation). The average left ventricular global longitudinal strain is -18.0 %. The global longitudinal strain is normal.  2. Right ventricular systolic function is normal. The right ventricular size is normal. Tricuspid regurgitation signal is inadequate for assessing PA pressure.  3. The mitral valve is normal in structure. No evidence of mitral valve regurgitation. No evidence of mitral stenosis.  4. The aortic valve is tricuspid. Aortic valve regurgitation is not visualized. Mild aortic valve sclerosis is present, with no evidence of aortic valve stenosis.  5. The inferior vena cava is normal in size with greater than 50% respiratory variability, suggesting right atrial pressure of 3 mmHg.     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[facility])  05/31/2020 Initial Diagnosis   Metastasis to lung ([place])   06/03/2020 -  Chemotherapy    Patient is on Treatment Plan: UTERINE  SEROUS CARCINOMA CARBOPLATIN + PACLITAXEL + TRASTUZUMAB Q21D X 6 CYCLES / TRASTUZUMAB Q21D         PHYSICAL EXAMINATION: ECOG PERFORMANCE STATUS: 3 - Symptomatic, >50% confined to bed GENERAL:alert, no distress and comfortable ABD: Positive bowel sounds, distended, ascites present EXT: Pitting edema bilaterally NEURO: alert & oriented x 3 with fluent speech, no focal motor/sensory deficits  LABORATORY DATA:  I have reviewed the data as listed    Component Value Date/Time   NA 132 (L) 01/02/2021 0931   NA 138 02/23/2020 1113   K 3.1 (L) 01/02/2021 0931   CL 101 01/02/2021 0931   CO2 20 (L) 01/02/2021 0931   GLUCOSE 105 (H) 01/02/2021 0931   BUN 34 (H) 01/02/2021 0931   BUN 15 02/23/2020 1113   CREATININE 1.30 (H) 01/02/2021 0931   CREATININE 1.44 (H) 12/30/2020 1407   CALCIUM 8.5 (L) 01/02/2021 0931   PROT 6.4 (L) 01/02/2021 0931   PROT 7.8 02/23/2020 1113   ALBUMIN 2.9 (L) 01/02/2021 0931   ALBUMIN 4.4 02/23/2020 1113   AST 14 (L) 01/02/2021 0931   AST 11 (L) 12/30/2020 1407   ALT 13 01/02/2021 0931   ALT 7 12/30/2020 1407   ALKPHOS 81 01/02/2021 0931   BILITOT 1.0 01/02/2021 0931   BILITOT 0.5 12/30/2020 1407   GFRNONAA 46 (L) 01/02/2021 0931   GFRNONAA 41 (L) 12/30/2020 1407   GFRAA 91 02/23/2020 1113    No results found for: SPEP, UPEP  Lab Results  Component Value Date   WBC 8.8 01/02/2021   NEUTROABS 6.4 12/30/2020   HGB 11.8 (L) 01/02/2021   HCT 36.4 01/02/2021   MCV 90.1 01/02/2021   PLT 359 01/02/2021      Chemistry      Component Value Date/Time   NA 132 (L) 01/02/2021 0931   NA 138 02/23/2020 1113   K 3.1 (L) 01/02/2021 0931   CL 101 01/02/2021 0931   CO2 20 (L) 01/02/2021 0931   BUN 34 (H) 01/02/2021 0931   BUN 15 02/23/2020 1113   CREATININE 1.30 (H) 01/02/2021 0931   CREATININE 1.44 (H) 12/30/2020 1407      Component Value Date/Time   CALCIUM 8.5 (L) 01/02/2021 0931   ALKPHOS 81 01/02/2021 0931   AST 14 (L) 01/02/2021 0931   AST  11 (L) 12/30/2020 1407   ALT 13 01/02/2021 0931   ALT 7 12/30/2020 1407   BILITOT 1.0 01/02/2021 0931   BILITOT 0.5 12/30/2020 1407     I have reviewed CT imaging with the patient and daughter

## 2021-01-02 NOTE — H&P (Signed)
History and Physical    Amanda Lin NAT:[id number] DOB: 1955-09-27 DOA: 01/02/2021  PCP: Nicolette Bang, MD  Patient coming from: Home  I have personally briefly reviewed patient's old medical records in [facility].  Chief Complaint: Abdominal pain and vomiting  HPI: Amanda Lin is a 65 y.o. female with medical history significant for endometrial cancer (diagnosed January 2022), diabetes, hypertension, COVID infection early July 2022, who presents to the emergency department on 01/02/2021 with abdominal pain and vomiting. Onset was 3 weeks ago after getting over a Covid infection. She developed constipation at that time. Had lactulose, miralax, but did not help. Pain is located in the periumbilical and episgastric areas and radiates to the rest of the abdomen. It is up to 6/10 at times and is characterized as sharp. She is unable to keep anything down, even water. She is having numerous episodes of vomiting sometimes several times an hour. Vomit is sometimes yellow/green and sometimes dark brown; it smells like rotten fish. Symptoms are alleviated by nothing and exacerbated by eating. Associated symptoms: Constipation; last BM was 12/31/20 and was actually diarrhea. No bloody stool. Chills but no fever. No shortness of breath, cough, wheezing, chest pain, palpitations. She had mild burning with urination that began 3 days prior to admission; no hematuria. Left sided hip and side pain started several days before admission.  Has had weight loss from 189 lb to 178 lb over a week.  Treatments: Patient followed up with oncology and was given IV fluids as an outpatient a few days before admission.  She is given antiemetics without relief.  ED Course: Labs include potassium 3.1, creatinine 1.3, albumin 2.9, hemoglobin 11.8.  Urinalysis shows large amount of leukocytes, protein, WBCs, and RBCs.  CT scan shows ileus with possible evolving obstruction that cannot be entirely excluded,  progression of ascites and progression of omental soft tissue nodularity with caking, worsening of bilateral hydronephrosis with obstructive uropathy, and other findings. Patient was given IV pantoprazole, IV antiemetics, IV pain medication.   History:   Endometrial cancer diagnosed 05/2020; started treatment that month. Most recent Her2 treatment Saturday.   Review of Systems: As per HPI otherwise all other systems reviewed and are unremarable.  No headache or new focal weakness.    Past Medical History:  Diagnosis Date   Diabetes mellitus without complication ([place])    Endometrial cancer ([place])    Hypertension     Past Surgical History:  Procedure Laterality Date   IR IMAGING GUIDED PORT INSERTION  06/01/2020   TUBAL LIGATION Bilateral     Social History  reports that she has never smoked. She has never used smokeless tobacco. She reports current alcohol use. She reports that she does not use drugs.  No Known Allergies  Family History  Problem Relation Age of Onset   Cancer Mother        uterine diagnosed around 69   Prostate cancer Other      Home Medications  Prior to Admission medications   Medication Sig Start Date End Date Taking? Authorizing Provider  atenolol (TENORMIN) 50 MG tablet Take 1 tablet (50 mg total) by mouth daily. 12/27/20  Yes Gorsuch, Ni, MD  chlorhexidine (PERIDEX) 0.12 % solution 15 mLs 2 (two) times daily. 11/26/20  Yes [provider]  lidocaine-prilocaine (EMLA) cream Apply to affected area once 05/31/20  Yes Gorsuch, Ni, MD  losartan (COZAAR) 100 MG tablet Take 1 tablet (100 mg total) by mouth daily. 07/14/20  Yes Alvy Bimler,  Ni, MD  magnesium oxide (MAG-OX) 400 (240 Mg) MG tablet Take 1 tablet (400 mg total) by mouth daily. 10/20/20  Yes Gorsuch, Ni, MD  ondansetron (ZOFRAN) 8 MG tablet Take 1 tablet (8 mg total) by mouth every 8 (eight) hours as needed for refractory nausea / vomiting. Start on day 3 after carboplatin chemo. 05/31/20  Yes  Gorsuch, Ni, MD  oxyCODONE 10 MG TABS Take 1 tablet (10 mg total) by mouth every 4 (four) hours as needed for severe pain. 12/27/20  Yes Gorsuch, Ni, MD  polyethylene glycol (MIRALAX / GLYCOLAX) 17 g packet Take 17 g by mouth daily as needed for mild constipation.   Yes [provider]  prochlorperazine (COMPAZINE) 10 MG tablet TAKE 1 TABLET(10 MG) BY MOUTH EVERY 6 HOURS AS NEEDED FOR NAUSEA OR VOMITING Patient taking differently: Take 10 mg by mouth every 6 (six) hours as needed for nausea or vomiting. 12/06/20  Yes Gorsuch, Ni, MD  sennosides-docusate sodium (SENOKOT-S) 8.6-50 MG tablet Take 1 tablet by mouth daily as needed for constipation.   Yes [provider]  amLODipine (NORVASC) 10 MG tablet TAKE 1 TABLET(10 MG) BY MOUTH DAILY Patient not taking: No sig reported 12/15/20   [name], MD    Physical Exam: Vitals:   01/02/21 1015 01/02/21 1030 01/02/21 1045 01/02/21 1100  BP: (!) 151/63 (!) 159/66 (!) 160/68 (!) 159/69  Pulse: (!) 56 (!) 57 (!) 58 (!) 56  Resp: 19 15 (!) 21 14  Temp:      TempSrc:      SpO2: 99% 100% 100% 100%    Constitutional: calm, ill-appearing. Vitals:   01/02/21 1015 01/02/21 1030 01/02/21 1045 01/02/21 1100  BP: (!) 151/63 (!) 159/66 (!) 160/68 (!) 159/69  Pulse: (!) 56 (!) 57 (!) 58 (!) 56  Resp: 19 15 (!) 21 14  Temp:      TempSrc:      SpO2: 99% 100% 100% 100%   Eyes: Pupils equal and round, lids and conjunctivae without icterus or erythema. ENMT: Mucous membranes are dry. Posterior pharynx clear of any exudate or lesions. Nares patent without discharge or bleeding.  NG tube in place and draining fluid.  Normocephalic, atraumatic.  Normal dentition.  Neck: normal, supple, no masses, trachea midline.  Thyroid nontender, no masses appreciated, no thyromegaly. Respiratory: clear to auscultation bilaterally. Chest wall movements are symmetric. No wheezing, no crackles.  No rhonchi.  Normal respiratory effort. No accessory muscle use.   Cardiovascular: Regular rate and rhythm, no rubs / gallops.  2/6 systolic flow murmur.  Lower extremity edema: 2+ bilaterally.  Pulses: DP pulses 2+ bilaterally. No carotid bruits.  Capillary refill less than 3 seconds.  GI: soft, distended, hypoactive bowel sounds.  Tenderness especially in the periumbilical area.  Right CVA tenderness.  No hepatosplenomegaly. No rigidity, rebound, or guarding.  Musculoskeletal: no clubbing / cyanosis. No joint deformity upper and lower extremities. Good ROM, no contractures. Normal muscle tone.  No tenderness or deformity in the back bilaterally. Integument: no rashes, lesions, ulcers. No induration. Clean, dry, intact. Neurologic: CN 2-12 grossly intact. Sensation grossly intact to light touch. DTR 2+ bilaterally.  Babinski: Toes downgoing bilaterally.  Strength 4/5 in upper extremities and 3-/5 in lower extremities, symmetric.  Intact rapid alternating movements bilaterally.  No pronator drift. Psychiatric: Normal judgment and insight. Alert and oriented x 3. Normal mood.  Normal and appropriate affect. Lymphatic: No cervical lymphadenopathy. No supraclavicular lymphadenopathy.   Labs on Admission: I have personally reviewed  the following labs and imaging studies.  CBC: Recent Labs  Lab 12/27/20 1139 12/30/20 1407 01/02/21 0931  WBC 9.2 8.2 8.8  NEUTROABS 7.7 6.4  --   HGB 12.2 10.9* 11.8*  HCT 36.7 33.4* 36.4  MCV 88.2 88.4 90.1  PLT 363 335 433    Basic Metabolic Panel: Recent Labs  Lab 12/27/20 1139 12/30/20 1407 01/02/21 0931  NA 137 136 132*  K 2.9* 3.1* 3.1*  CL 97* 101 101  CO2 26 25 20*  GLUCOSE 133* 95 105*  BUN 30* 27* 34*  CREATININE 1.45* 1.44* 1.30*  CALCIUM 9.3 8.8* 8.5*  MG 1.9 1.6*  --     GFR: Estimated Creatinine Clearance: 42.2 mL/min (A) (by C-G formula based on SCr of 1.3 mg/dL (H)).  Liver Function Tests: Recent Labs  Lab 12/27/20 1139 12/30/20 1407 01/02/21 0931  AST 18 11* 14*  ALT 10 7 13   ALKPHOS 87  84 81  BILITOT 0.5 0.5 1.0  PROT 7.2 6.3* 6.4*  ALBUMIN 3.3* 2.9* 2.9*    Urine analysis:    Component Value Date/Time   COLORURINE YELLOW 01/02/2021 0931   APPEARANCEUR CLOUDY (A) 01/02/2021 0931   LABSPEC 1.015 01/02/2021 0931   PHURINE 5.0 01/02/2021 0931   GLUCOSEU NEGATIVE 01/02/2021 0931   HGBUR MODERATE (A) 01/02/2021 0931   BILIRUBINUR NEGATIVE 01/02/2021 0931   BILIRUBINUR moderate (A) 02/23/2020 0946   KETONESUR 5 (A) 01/02/2021 0931   PROTEINUR 100 (A) 01/02/2021 0931   UROBILINOGEN 0.2 02/23/2020 0946   NITRITE NEGATIVE 01/02/2021 0931   LEUKOCYTESUR LARGE (A) 01/02/2021 0931    Radiological Exams on Admission: CT ABDOMEN PELVIS W CONTRAST  Result Date: 01/02/2021 CLINICAL DATA:  Endometrial cancer.  Bowel obstruction suspected. EXAM: CT ABDOMEN AND PELVIS WITH CONTRAST TECHNIQUE: Multidetector CT imaging of the abdomen and pelvis was performed using the standard protocol following bolus administration of intravenous contrast. CONTRAST:  44m OMNIPAQUE IOHEXOL 350 MG/ML SOLN COMPARISON:  11/02/2020 FINDINGS: Lower chest: Basilar atelectasis. Hepatobiliary: No suspicious focal abnormality within the liver parenchyma. Gallbladder is surgically absent. No intrahepatic or extrahepatic biliary dilation. Pancreas: No focal mass lesion. No dilatation of the main duct. No intraparenchymal cyst. No peripancreatic edema. Spleen: No splenomegaly. No focal mass lesion. Adrenals/Urinary Tract: No adrenal nodule or mass. Mild right and moderate left hydronephrosis evident with decreased perfusion to the left kidney. 1.3 cm lesion lower pole right kidney, previously characterized as hemorrhagic cyst. More subtle enhancing lesion identified anterior lower pole right kidney, previously characterized as suspicious for neoplasm on CT without and with contrast dated 03/28/2020. Both ureters are dilated down to the level of the pelvic sidewall. Bladder is unremarkable. Stomach/Bowel: Stomach is  unremarkable. No gastric wall thickening. No evidence of outlet obstruction. Duodenum is normally positioned as is the ligament of Treitz. No small bowel wall thickening. Small bowel is mildly distended up to 3 cm diameter and diffusely fluid-filled. The terminal ileum is normal. The appendix is normal. No gross colonic mass. No colonic wall thickening. Vascular/Lymphatic: There is mild atherosclerotic calcification of the abdominal aorta without aneurysm. Small lymph nodes are seen in the gastrohepatic and hepatoduodenal ligament. Small lymph nodes evident in the retroperitoneal space of the abdomen along the pelvic sidewall bilaterally. Reproductive: Calcified fibroid noted in the uterus. Endometrium not well visualized. There is no adnexal mass. Other: Interval progression ascites with progression of omental soft tissue nodularity now demonstrating areas of confluent "caking" (see midline image 46/2). Enhancing peritoneal nodules are seen adjacent to the  inferior liver (35/2) and in the right pelvis (63/2). There are enhancing mesenteric nodules (see central mesentery on 52/2) with soft tissue nodularity in the region of the umbilicus measuring 2.3 x 2.2 cm on image 54/2. Moderate volume ascites seen around the liver and spleen and along the stomach with moderate volume ascites in the pelvis is well. Musculoskeletal: No worrisome lytic or sclerotic osseous abnormality. IMPRESSION: 1. Interval progression of ascites with progression of omental soft tissue nodularity now demonstrating areas of confluent caking. Peritoneal nodularity is also progressive in the interval. 2. Small bowel is mildly distended and fluid-filled up to 3 cm diameter and diffusely fluid-filled. Imaging features are compatible with ileus. Evolving obstruction not entirely excluded. 3. Interval worsening of mild right and moderate left hydronephrosis with decreased perfusion to the left kidney compatible with obstructive uropathy. Both ureters  are obstructed at the level of the pelvic sidewall. 4. Subtle enhancing lesion anterior lower pole right kidney, previously characterized as neoplasm on CT without and with contrast dated 03/28/2020. 5. Small lymph nodes in the gastrohepatic and hepatoduodenal ligament, in the retroperitoneal space of the abdomen, and along the pelvic sidewall bilaterally. 6. Calcified fibroid in the uterus. Endometrium not well visualized. 7. Aortic Atherosclerosis (ICD10-I70.0). Electronically Signed   By: Misty Stanley M.D.   On: 01/02/2021 11:56    Echocardiogram, 11/16/2020: IMPRESSIONS  1. Left ventricular ejection fraction, by estimation, is 55 to 60%. The  left ventricle has normal function. The left ventricle has no regional  wall motion abnormalities. There is moderate left ventricular hypertrophy.  Left ventricular diastolic  parameters are consistent with Grade I diastolic dysfunction (impaired  relaxation). The average left ventricular global longitudinal strain is  -18.0 %. The global longitudinal strain is normal.   2. Right ventricular systolic function is normal. The right ventricular  size is normal. Tricuspid regurgitation signal is inadequate for assessing  PA pressure.   3. The mitral valve is normal in structure. No evidence of mitral valve  regurgitation. No evidence of mitral stenosis.   4. The aortic valve is tricuspid. Aortic valve regurgitation is not  visualized. Mild aortic valve sclerosis is present, with no evidence of  aortic valve stenosis.   5. The inferior vena cava is normal in size with greater than 50%  respiratory variability, suggesting right atrial pressure of 3 mmHg.    Assessment/Plan Principal Problem:   Endometrial carcinoma (HCC) Active Problems:   Ileus ([place])   Acute cystitis with hematuria   Obstructive uropathy   Hypokalemia   Cancer associated pain   Nausea with vomiting   Principal Problem:   Endometrial carcinoma (HCC) Worsening.  Appreciate  oncology consult performed in the emergency department.  Plan: Per oncologist, the patient needs inpatient treatment because she will need IV antibiotics and then it is anticipated she will start chemotherapy.  Further plans per oncologist.  IV pain medication as needed.   Active Problems:   Ileus [facility]) May have a developing partial small bowel obstruction. Plan: NPO.  NG tube to low intermittent wall suction.  Bowel rest.  IV fluids.    Acute cystitis with hematuria Patient did have symptoms and has an abnormal urinalysis.  Plan: Cultures.  IV ceftriaxone.    Obstructive uropathy Bilateral.  Present on previous imaging but is worsening.  Plan: Emergency department physician consulted the urologist.  Further treatment depending on recommendations of the urologist.    Hypokalemia Likely due to vomiting.  Plan: Replace IV.    Cancer associated pain  Plan: IV morphine as needed.    Nausea with vomiting Failed outpatient management.  Plan: IV antiemetics.   DVT prophylaxis: Lovenox.  Code Status:   Full Code [place] Mam Daughter [phone]   [phone]  Discussed with daughter at bedside.    Disposition Plan:   Patient is from:  Home  Anticipated DC to:  Home  Anticipated DC date:  01/10/21  Anticipated DC barriers: Pain  Consults called:  ED physician consulted oncologist (Dr. Alvy Bimler) and urologist (Dr. Jeffie Pollock). Admission status:  Inpatient  Severity of Illness: The appropriate patient status for this patient is INPATIENT. Inpatient status is judged to be reasonable and necessary in order to provide the required intensity of service to ensure the patient's safety. The patient's presenting symptoms, physical exam findings, and initial radiographic and laboratory data in the context of their chronic comorbidities is felt to place them at high risk for further clinical deterioration. Furthermore, it is not anticipated that the patient will be medically  stable for discharge from the hospital within 2 midnights of admission. The following factors support the patient status of inpatient.   " The patient's presenting symptoms include abdominal pain, severe vomiting, weight loss, and inability to take p.o. " The worrisome physical exam findings include abdominal tenderness, hypoactive bowel sounds. " The initial radiographic and laboratory data are worrisome because of ileus that could be developing small bowel obstruction among other findings. " The chronic co-morbidities include endometrial cancer.   * I certify that at the point of admission it is my clinical judgment that the patient will require inpatient hospital care spanning beyond 2 midnights from the point of admission due to high intensity of service, high risk for further deterioration and high frequency of surveillance required.Tacey Ruiz MD [facility]  How to contact the [facility] Attending or Consulting provider [place] or covering provider during after hours [place], for this patient?   Check the care team in [facility] and look for a) attending/consulting [facility] provider listed and b) the [facility] team listed Log into [email or web address] and use [place]'s universal password to access. If you do not have the password, please contact the hospital operator. Locate the [facility] provider you are looking for under [facility] and page to a number that you can be directly reached. If you still have difficulty reaching the provider, please page the [facility] (Director on Call) for the Hospitalists listed on amion for assistance.  01/02/2021, 1:43 PM

## 2021-01-02 NOTE — Progress Notes (Signed)
Chaplain engaged in an initial visit with Amanda Lin and her oldest daughter.  Chaplain was able to look over Advanced Directive.  It is ready for notarization.  Chaplain was not able to obtain witnesses today.  Chaplain suggested we should wait until she has a bed on a unit to complete paperwork and be able to put it in her medical chart.    Chaplain will follow-up tomorrow.     01/02/21 1300  Clinical Encounter Type  Visited With Patient and family together  Visit Type Initial;Social support

## 2021-01-02 NOTE — ED Provider Notes (Signed)
[facility] DEPT Provider Note   CSN: [id number] Arrival date & time: 01/02/21  0900     History Chief Complaint  Patient presents with  . Abdominal Pain  . Constipation    Amanda Lin is a 65 y.o. female.   Abdominal Pain Associated symptoms: constipation   Constipation Associated symptoms: abdominal pain    Presents to the ED with complaints of abdominal pain nausea and vomiting.  Patient has a history of endometrial cancer.  She has been having increasing symptoms over the last couple of weeks.  Patient has tried taking laxatives without relief.  Patient has been nauseated and has been vomiting.  She is having diffuse abdominal pain that is more focused in the upper abdomen.  She has not been able to keep anything down.  No fevers.  Was in the oncology center on August 27 and was given IV fluids .  She was also seen by her oncologist on August 26.  She has been trying antiemetics without relief.  Patient's last CT scan was on June 29, at that time there was no significant change.  She did have plain films on August 18 without acute findings. Past Medical History:  Diagnosis Date  . Diabetes mellitus without complication ([place])   . Endometrial cancer ([place])   . Hypertension     Patient Active Problem List   Diagnosis Date Noted  . Vomiting, unspecified 01/02/2021  . GERD (gastroesophageal reflux disease) 12/29/2020  . Loose stools 09/22/2020  . Generalized weakness 09/22/2020  . Poor dentition 08/16/2020  . 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 Pancytopenia, acquired ([place]) 07/08/2020  . Hypomagnesemia 07/08/2020  . Hypokalemia, inadequate intake 07/08/2020  . Weight loss 06/24/2020  . Nausea with vomiting 06/07/2020  . 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 Goals of care, counseling/discussion 05/31/2020  . Other constipation 05/20/2020  . Deficiency anemia  05/12/2020  . Cancer associated pain 05/12/2020  . Diabetes mellitus ([place]) 05/12/2020  . Endometrial carcinoma ([place]) 04/25/2020  . Dysuria 04/20/2020  . Postmenopausal bleeding 04/19/2020  . Pap smear for cervical cancer screening 04/19/2020  . Screening mammogram for breast cancer 04/19/2020  . Elevated blood pressure reading 04/19/2020  . Essential hypertension 04/19/2020  . Right renal mass 02/26/2020  . Kidney stone 02/26/2020  . Hematuria 02/26/2020    Past Surgical History:  Procedure Laterality Date  . IR IMAGING GUIDED PORT INSERTION  06/01/2020  . TUBAL LIGATION Bilateral      OB History   No obstetric history on file.     Family History  Problem Relation Age of Onset  . Cancer Mother        uterine diagnosed around 70  . Prostate cancer Other     Social History   Tobacco Use  . Smoking status: Never  . Smokeless tobacco: Never  Vaping Use  . Vaping Use: Never used  Substance Use Topics  . Alcohol use: Yes    Comment: ocassionally  . Drug use: No    Home Medications Prior to Admission medications   Medication Sig Start Date End Date Taking? Authorizing Provider  amLODipine (NORVASC) 10 MG tablet TAKE 1 TABLET(10 MG) BY MOUTH DAILY 12/15/20   [name], MD  atenolol (TENORMIN) 50 MG tablet Take 1 tablet (50 mg total) by mouth daily. 12/27/20   [name], MD  chlorhexidine (PERIDEX) 0.12 % solution 15 mLs 2 (two) times daily. 11/26/20  [provider]  lidocaine-prilocaine (EMLA) cream Apply to affected area once 05/31/20   [name], MD  losartan (COZAAR) 100 MG tablet Take 1 tablet (100 mg total) by mouth daily. 07/14/20   [name], MD  magnesium oxide (MAG-OX) 400 (240 Mg) MG tablet Take 1 tablet (400 mg total) by mouth daily. 10/20/20   [name], MD  ondansetron (ZOFRAN) 8 MG tablet Take 1 tablet (8 mg total) by mouth every 8 (eight) hours as needed for refractory nausea / vomiting. Start on day 3 after carboplatin chemo. 05/31/20    [name], MD  oxyCODONE 10 MG TABS Take 1 tablet (10 mg total) by mouth every 4 (four) hours as needed for severe pain. 12/27/20   [name], MD  polyethylene glycol (MIRALAX / GLYCOLAX) 17 g packet Take 17 g by mouth daily.    [provider]  prochlorperazine (COMPAZINE) 10 MG tablet TAKE 1 TABLET(10 MG) BY MOUTH EVERY 6 HOURS AS NEEDED FOR NAUSEA OR VOMITING 12/06/20   [name], MD    Allergies    Patient has no known allergies.  Review of Systems   Review of Systems  Gastrointestinal:  Positive for abdominal pain and constipation.  All other systems reviewed and are negative.  Physical Exam Updated Vital Signs BP (!) 159/69   Pulse (!) 56   Temp 98 F (36.7 C) (Oral)   Resp 14   SpO2 100%   Physical Exam Vitals and nursing note reviewed.  Constitutional:      Appearance: She is well-developed. She is ill-appearing.  HENT:     Head: Normocephalic and atraumatic.     Right Ear: External ear normal.     Left Ear: External ear normal.  Eyes:     General: No scleral icterus.       Right eye: No discharge.        Left eye: No discharge.     Conjunctiva/sclera: Conjunctivae normal.  Neck:     Trachea: No tracheal deviation.  Cardiovascular:     Rate and Rhythm: Normal rate and regular rhythm.  Pulmonary:     Effort: Pulmonary effort is normal. No respiratory distress.     Breath sounds: Normal breath sounds. No stridor. No wheezing or rales.  Abdominal:     General: Bowel sounds are normal. There is no distension.     Palpations: Abdomen is soft.     Tenderness: There is abdominal tenderness in the epigastric area. There is no guarding or rebound.  Genitourinary:    Comments: No fecal impaction, no rectal mass Musculoskeletal:        General: No tenderness or deformity.     Cervical back: Neck supple.  Skin:    General: Skin is warm and dry.     Findings: No rash.  Neurological:     General: No focal deficit present.     Mental Status: She is alert.      Cranial Nerves: No cranial nerve deficit (no facial droop, extraocular movements intact, no slurred speech).     Sensory: No sensory deficit.     Motor: No abnormal muscle tone or seizure activity.     Coordination: Coordination normal.  Psychiatric:        Mood and Affect: Mood normal.    ED Results / Procedures / Treatments   Labs (all labs ordered are listed, but only abnormal results are displayed) Labs Reviewed  COMPREHENSIVE METABOLIC PANEL - Abnormal; Notable for the following components:  Result Value   Sodium 132 (*)    Potassium 3.1 (*)    CO2 20 (*)    Glucose, Bld 105 (*)    BUN 34 (*)    Creatinine, Ser 1.30 (*)    Calcium 8.5 (*)    Total Protein 6.4 (*)    Albumin 2.9 (*)    AST 14 (*)    GFR, Estimated 46 (*)    All other components within normal limits  CBC - Abnormal; Notable for the following components:   Hemoglobin 11.8 (*)    RDW 15.6 (*)    All other components within normal limits  URINALYSIS, ROUTINE W REFLEX MICROSCOPIC - Abnormal; Notable for the following components:   APPearance CLOUDY (*)    Hgb urine dipstick MODERATE (*)    Ketones, ur 5 (*)    Protein, ur 100 (*)    Leukocytes,Ua LARGE (*)    RBC / HPF >50 (*)    WBC, UA >50 (*)    Bacteria, UA RARE (*)    All other components within normal limits  URINE CULTURE  LIPASE, BLOOD  HIV ANTIBODY (ROUTINE TESTING W REFLEX)  POC OCCULT BLOOD, ED    EKG None  Radiology CT ABDOMEN PELVIS W CONTRAST  Result Date: 01/02/2021 CLINICAL DATA:  Endometrial cancer.  Bowel obstruction suspected. EXAM: CT ABDOMEN AND PELVIS WITH CONTRAST TECHNIQUE: Multidetector CT imaging of the abdomen and pelvis was performed using the standard protocol following bolus administration of intravenous contrast. CONTRAST:  27m OMNIPAQUE IOHEXOL 350 MG/ML SOLN COMPARISON:  11/02/2020 FINDINGS: Lower chest: Basilar atelectasis. Hepatobiliary: No suspicious focal abnormality within the liver parenchyma.  Gallbladder is surgically absent. No intrahepatic or extrahepatic biliary dilation. Pancreas: No focal mass lesion. No dilatation of the main duct. No intraparenchymal cyst. No peripancreatic edema. Spleen: No splenomegaly. No focal mass lesion. Adrenals/Urinary Tract: No adrenal nodule or mass. Mild right and moderate left hydronephrosis evident with decreased perfusion to the left kidney. 1.3 cm lesion lower pole right kidney, previously characterized as hemorrhagic cyst. More subtle enhancing lesion identified anterior lower pole right kidney, previously characterized as suspicious for neoplasm on CT without and with contrast dated 03/28/2020. Both ureters are dilated down to the level of the pelvic sidewall. Bladder is unremarkable. Stomach/Bowel: Stomach is unremarkable. No gastric wall thickening. No evidence of outlet obstruction. Duodenum is normally positioned as is the ligament of Treitz. No small bowel wall thickening. Small bowel is mildly distended up to 3 cm diameter and diffusely fluid-filled. The terminal ileum is normal. The appendix is normal. No gross colonic mass. No colonic wall thickening. Vascular/Lymphatic: There is mild atherosclerotic calcification of the abdominal aorta without aneurysm. Small lymph nodes are seen in the gastrohepatic and hepatoduodenal ligament. Small lymph nodes evident in the retroperitoneal space of the abdomen along the pelvic sidewall bilaterally. Reproductive: Calcified fibroid noted in the uterus. Endometrium not well visualized. There is no adnexal mass. Other: Interval progression ascites with progression of omental soft tissue nodularity now demonstrating areas of confluent "caking" (see midline image 46/2). Enhancing peritoneal nodules are seen adjacent to the inferior liver (35/2) and in the right pelvis (63/2). There are enhancing mesenteric nodules (see central mesentery on 52/2) with soft tissue nodularity in the region of the umbilicus measuring 2.3 x 2.2  cm on image 54/2. Moderate volume ascites seen around the liver and spleen and along the stomach with moderate volume ascites in the pelvis is well. Musculoskeletal: No worrisome lytic or sclerotic osseous abnormality. IMPRESSION:  1. Interval progression of ascites with progression of omental soft tissue nodularity now demonstrating areas of confluent caking. Peritoneal nodularity is also progressive in the interval. 2. Small bowel is mildly distended and fluid-filled up to 3 cm diameter and diffusely fluid-filled. Imaging features are compatible with ileus. Evolving obstruction not entirely excluded. 3. Interval worsening of mild right and moderate left hydronephrosis with decreased perfusion to the left kidney compatible with obstructive uropathy. Both ureters are obstructed at the level of the pelvic sidewall. 4. Subtle enhancing lesion anterior lower pole right kidney, previously characterized as neoplasm on CT without and with contrast dated 03/28/2020. 5. Small lymph nodes in the gastrohepatic and hepatoduodenal ligament, in the retroperitoneal space of the abdomen, and along the pelvic sidewall bilaterally. 6. Calcified fibroid in the uterus. Endometrium not well visualized. 7. Aortic Atherosclerosis (ICD10-I70.0). Electronically Signed   By: Misty Stanley M.D.   On: 01/02/2021 11:56    Procedures Procedures   Medications Ordered in ED Medications  sodium chloride 0.9 % bolus 1,000 mL (0 mLs Intravenous Stopped 01/02/21 1054)    Followed by  0.9 %  sodium chloride infusion (1,000 mLs Intravenous New Bag/Given 01/02/21 1054)  ipratropium-albuterol (DUONEB) 0.5-2.5 (3) MG/3ML nebulizer solution (has no administration in time range)  enoxaparin (LOVENOX) injection 40 mg (has no administration in time range)  acetaminophen (TYLENOL) tablet 650 mg (has no administration in time range)    Or  acetaminophen (TYLENOL) suppository 650 mg (has no administration in time range)  HYDROcodone-acetaminophen  (NORCO/VICODIN) 5-325 MG per tablet 1-2 tablet (has no administration in time range)  morphine 2 MG/ML injection 2 mg (has no administration in time range)  senna-docusate (Senokot-S) tablet 1 tablet (has no administration in time range)  bisacodyl (DULCOLAX) EC tablet 5 mg (has no administration in time range)  ondansetron (ZOFRAN) tablet 4 mg (has no administration in time range)    Or  ondansetron (ZOFRAN) injection 4 mg (has no administration in time range)  metoprolol tartrate (LOPRESSOR) injection 5 mg (has no administration in time range)  prochlorperazine (COMPAZINE) injection 10 mg (has no administration in time range)  morphine 4 MG/ML injection 4 mg (4 mg Intravenous Given 01/02/21 0947)  ondansetron (ZOFRAN) injection 4 mg (4 mg Intravenous Given 01/02/21 0947)  iohexol (OMNIPAQUE) 350 MG/ML injection 100 mL (80 mLs Intravenous Contrast Given 01/02/21 1108)  pantoprazole (PROTONIX) injection 40 mg (40 mg Intravenous Given 01/02/21 1140)    ED Course  I have reviewed the triage vital signs and the nursing notes.  Pertinent labs & imaging results that were available during my care of the patient were reviewed by me and considered in my medical decision making (see chart for details).  Clinical Course as of 01/02/21 1235  Mon Aug 29, [id number]  [id number] Metabolic panel shows stable renal insufficiency.  Urinalysis does suggest possible urine infection [JK]  1232 CBC unremarkable. [JK]    Clinical Course User Index [JK] Dorie Rank, MD   MDM Rules/Calculators/A&P                           Patient presented to the ED for evaluation of abdominal pain.  Presentation concerning for the possibility of obstruction, colitis diverticulitis.  Patient to continue with persistent nausea and vomiting in the ED.  CT scan was performed and it does show evidence of ileus and possible obstructive uropathy.  Her pain may be related to the ureteral obstruction versus ileus as well as her  endometrial cancer.   Patient was given IV fluids and antiemetics as well as pain medications.  I have consulted with hospitalist.  Oncology team has also been by to see the patient.  We will consult with urology Final Clinical Impression(s) / ED Diagnoses Final diagnoses:  Endometrial cancer ([place])  Ileus ([place])  Obstructive uropathy     Dorie Rank, MD 01/02/21 1235

## 2021-01-02 NOTE — Consult Note (Signed)
Subjective:    Consult requested by Dr. Domenica Reamer is a 65 yo female who I was asked to see for progressive bilateral hydronephrosis.   She is admitted with abdominal pain and nausea with an ileus noted on CT along with ascites and bilateral hydronephrosis, left > right to the pelvic side walls.   The hydro has been on prior imaging but has progressed.  She is voiding without difficulty and her Cr is actually down at 1.3 with recent IV fluids.  Her condition is poor and it is planned for her to receive a last chance course of therapy in the near future.   She doesn't report lateralization of the pain which is diffuse and progressive over the last several days.   ROS:  Review of Systems  Constitutional:  Positive for malaise/fatigue. Negative for chills and fever.  Gastrointestinal:  Positive for abdominal pain, constipation, nausea and vomiting.  All other systems reviewed and are negative.  No Known Allergies  Past Medical History:  Diagnosis Date   Diabetes mellitus without complication ([place])    Endometrial cancer ([place])    Hypertension     Past Surgical History:  Procedure Laterality Date   IR IMAGING GUIDED PORT INSERTION  06/01/2020   TUBAL LIGATION Bilateral     Social History   Socioeconomic History   Marital status: Married    Spouse name: Richard   Number of children: 2   Years of education: Not on file   Highest education level: Not on file  Occupational History   Occupation: retired  Tobacco Use   Smoking status: Never   Smokeless tobacco: Never  Vaping Use   Vaping Use: Never used  Substance and Sexual Activity   Alcohol use: Yes    Comment: ocassionally   Drug use: No   Sexual activity: Yes  Other Topics Concern   Not on file  Social History Narrative   Not on file   Social Determinants of Health   Financial Resource Strain: Not on file  Food Insecurity: Food Insecurity Present   Worried About [place] in the Last Year: Often  true   [place] in the Last Year: Sometimes true  Transportation Needs: No Transportation Needs   Lack of Transportation (Medical): No   Lack of Transportation (Non-Medical): No  Physical Activity: Not on file  Stress: Not on file  Social Connections: Not on file  Intimate Partner Violence: Not on file    Family History  Problem Relation Age of Onset   Cancer Mother        uterine diagnosed around 6   Prostate cancer Other     Anti-infectives: Anti-infectives (From admission, onward)    Start     Dose/Rate Route Frequency Ordered Stop   01/02/21 1400  cefTRIAXone (ROCEPHIN) 2 g in sodium chloride 0.9 % 100 mL IVPB        2 g 200 mL/hr over 30 Minutes Intravenous Every 24 hours 01/02/21 1256 01/05/21 1359   01/02/21 1300  cefTRIAXone (ROCEPHIN) 1 g in sodium chloride 0.9 % 100 mL IVPB  Status:  Discontinued        1 g 200 mL/hr over 30 Minutes Intravenous Every 12 hours 01/02/21 1248 01/02/21 1254       Current Facility-Administered Medications  Medication Dose Route Frequency Provider Last Rate Last Admin   0.9 %  sodium chloride infusion  1,000 mL Intravenous Continuous Dorie Rank, MD 125 mL/hr at 01/02/21 1054  1,000 mL at 01/02/21 1054   acetaminophen (TYLENOL) tablet 650 mg  650 mg Oral Q6H PRN Tacey Ruiz, MD       Or   acetaminophen (TYLENOL) suppository 650 mg  650 mg Rectal Q6H PRN Tacey Ruiz, MD       bisacodyl (DULCOLAX) EC tablet 5 mg  5 mg Oral Daily PRN Tacey Ruiz, MD       cefTRIAXone (ROCEPHIN) 2 g in sodium chloride 0.9 % 100 mL IVPB  2 g Intravenous Q24H Lenis Noon, RPH 200 mL/hr at 01/02/21 1415 2 g at 01/02/21 1415   enoxaparin (LOVENOX) injection 40 mg  40 mg Subcutaneous Q24H Tacey Ruiz, MD   40 mg at 01/02/21 1622   HYDROcodone-acetaminophen (NORCO/VICODIN) 5-325 MG per tablet 1-2 tablet  1-2 tablet Oral Q4H PRN Tacey Ruiz, MD       ipratropium-albuterol (DUONEB) 0.5-2.5 (3) MG/3ML nebulizer solution             metoprolol tartrate (LOPRESSOR) injection 5 mg  5 mg Intravenous Q6H PRN Tacey Ruiz, MD       morphine 2 MG/ML injection 2 mg  2 mg Intravenous Q2H PRN Tacey Ruiz, MD   2 mg at 01/02/21 1623   ondansetron (ZOFRAN) tablet 4 mg  4 mg Oral Q6H PRN Tacey Ruiz, MD       Or   ondansetron [facility]) injection 4 mg  4 mg Intravenous Q6H PRN Tacey Ruiz, MD   4 mg at 01/02/21 1415   prochlorperazine (COMPAZINE) injection 10 mg  10 mg Intravenous Q6H PRN Tacey Ruiz, MD   10 mg at 01/02/21 1622   senna-docusate (Senokot-S) tablet 1 tablet  1 tablet Oral QHS PRN Tacey Ruiz, MD       Current Outpatient Medications  Medication Sig Dispense Refill   atenolol (TENORMIN) 50 MG tablet Take 1 tablet (50 mg total) by mouth daily. 30 tablet 1   chlorhexidine (PERIDEX) 0.12 % solution 15 mLs 2 (two) times daily.     lidocaine-prilocaine (EMLA) cream Apply to affected area once 30 g 3   losartan (COZAAR) 100 MG tablet Take 1 tablet (100 mg total) by mouth daily. 90 tablet 1   magnesium oxide (MAG-OX) 400 (240 Mg) MG tablet Take 1 tablet (400 mg total) by mouth daily. 30 tablet 1   ondansetron (ZOFRAN) 8 MG tablet Take 1 tablet (8 mg total) by mouth every 8 (eight) hours as needed for refractory nausea / vomiting. Start on day 3 after carboplatin chemo. 30 tablet 1   oxyCODONE 10 MG TABS Take 1 tablet (10 mg total) by mouth every 4 (four) hours as needed for severe pain. 60 tablet 0   polyethylene glycol (MIRALAX / GLYCOLAX) 17 g packet Take 17 g by mouth daily as needed for mild constipation.     prochlorperazine (COMPAZINE) 10 MG tablet TAKE 1 TABLET(10 MG) BY MOUTH EVERY 6 HOURS AS NEEDED FOR NAUSEA OR VOMITING (Patient taking differently: Take 10 mg by mouth every 6 (six) hours as needed for nausea or vomiting.) 90 tablet 1   sennosides-docusate sodium (SENOKOT-S) 8.6-50 MG tablet Take 1 tablet by mouth daily as needed for constipation.     amLODipine (NORVASC) 10 MG tablet TAKE 1  TABLET(10 MG) BY MOUTH DAILY (Patient not taking: No sig reported) 30 tablet 1   Facility-Administered Medications Ordered in Other Encounters  Medication Dose Route Frequency Provider Last Rate Last Admin   ondansetron (ZOFRAN) 4 MG/2ML injection  Objective: Vital signs in last 24 hours: BP (!) 162/77 (BP Location: Right Arm)   Pulse (!) 58   Temp 98 F (36.7 C) (Oral)   Resp (!) 21   SpO2 99%   Intake/Output from previous day: No intake/output data recorded. Intake/Output this shift: No intake/output data recorded.   Physical Exam Constitutional:      Appearance: She is well-developed. She is ill-appearing.  Abdominal:     Palpations: Abdomen is soft.     Tenderness: There is generalized abdominal tenderness.  Neurological:     Mental Status: She is alert.    Lab Results:  Results for orders placed or performed during the hospital encounter of 01/02/21 (from the past 24 hour(s))  Lipase, blood     Status: None   Collection Time: 01/02/21  9:31 AM  Result Value Ref Range   Lipase 24 11 - 51 U/L  Comprehensive metabolic panel     Status: Abnormal   Collection Time: 01/02/21  9:31 AM  Result Value Ref Range   Sodium 132 (L) 135 - 145 mmol/L   Potassium 3.1 (L) 3.5 - 5.1 mmol/L   Chloride 101 98 - 111 mmol/L   CO2 20 (L) 22 - 32 mmol/L   Glucose, Bld 105 (H) 70 - 99 mg/dL   BUN 34 (H) 8 - 23 mg/dL   Creatinine, Ser 1.30 (H) 0.44 - 1.00 mg/dL   Calcium 8.5 (L) 8.9 - 10.3 mg/dL   Total Protein 6.4 (L) 6.5 - 8.1 g/dL   Albumin 2.9 (L) 3.5 - 5.0 g/dL   AST 14 (L) 15 - 41 U/L   ALT 13 0 - 44 U/L   Alkaline Phosphatase 81 38 - 126 U/L   Total Bilirubin 1.0 0.3 - 1.2 mg/dL   GFR, Estimated 46 (L) >60 mL/min   Anion gap 11 5 - 15  CBC     Status: Abnormal   Collection Time: 01/02/21  9:31 AM  Result Value Ref Range   WBC 8.8 4.0 - 10.5 K/uL   RBC 4.04 3.87 - 5.11 MIL/uL   Hemoglobin 11.8 (L) 12.0 - 15.0 g/dL   HCT 36.4 36.0 - 46.0 %   MCV 90.1 80.0 -  100.0 fL   MCH 29.2 26.0 - 34.0 pg   MCHC 32.4 30.0 - 36.0 g/dL   RDW 15.6 (H) 11.5 - 15.5 %   Platelets 359 150 - 400 K/uL   nRBC 0.0 0.0 - 0.2 %  Urinalysis, Routine w reflex microscopic Urine, Clean Catch     Status: Abnormal   Collection Time: 01/02/21  9:31 AM  Result Value Ref Range   Color, Urine YELLOW YELLOW   APPearance CLOUDY (A) CLEAR   Specific Gravity, Urine 1.015 1.005 - 1.030   pH 5.0 5.0 - 8.0   Glucose, UA NEGATIVE NEGATIVE mg/dL   Hgb urine dipstick MODERATE (A) NEGATIVE   Bilirubin Urine NEGATIVE NEGATIVE   Ketones, ur 5 (A) NEGATIVE mg/dL   Protein, ur 100 (A) NEGATIVE mg/dL   Nitrite NEGATIVE NEGATIVE   Leukocytes,Ua LARGE (A) NEGATIVE   RBC / HPF >50 (H) 0 - 5 RBC/hpf   WBC, UA >50 (H) 0 - 5 WBC/hpf   Bacteria, UA RARE (A) NONE SEEN   Squamous Epithelial / LPF 0-5 0 - 5   WBC Clumps PRESENT   POC occult blood, ED Provider will collect     Status: None   Collection Time: 01/02/21 10:22 AM  Result Value Ref Range  Fecal Occult Bld NEGATIVE NEGATIVE  HIV Antibody (routine testing w rflx)     Status: None   Collection Time: 01/02/21  1:07 PM  Result Value Ref Range   HIV Screen 4th Generation wRfx Non Reactive Non Reactive    BMET Recent Labs    01/02/21 0931  NA 132*  K 3.1*  CL 101  CO2 20*  GLUCOSE 105*  BUN 34*  CREATININE 1.30*  CALCIUM 8.5*   PT/INR No results for input(s): LABPROT, INR in the last 72 hours. ABG No results for input(s): PHART, HCO3 in the last 72 hours.  Invalid input(s): PCO2, PO2  Studies/Results: CT ABDOMEN PELVIS W CONTRAST  Result Date: 01/02/2021 CLINICAL DATA:  Endometrial cancer.  Bowel obstruction suspected. EXAM: CT ABDOMEN AND PELVIS WITH CONTRAST TECHNIQUE: Multidetector CT imaging of the abdomen and pelvis was performed using the standard protocol following bolus administration of intravenous contrast. CONTRAST:  65m OMNIPAQUE IOHEXOL 350 MG/ML SOLN COMPARISON:  11/02/2020 FINDINGS: Lower chest: Basilar  atelectasis. Hepatobiliary: No suspicious focal abnormality within the liver parenchyma. Gallbladder is surgically absent. No intrahepatic or extrahepatic biliary dilation. Pancreas: No focal mass lesion. No dilatation of the main duct. No intraparenchymal cyst. No peripancreatic edema. Spleen: No splenomegaly. No focal mass lesion. Adrenals/Urinary Tract: No adrenal nodule or mass. Mild right and moderate left hydronephrosis evident with decreased perfusion to the left kidney. 1.3 cm lesion lower pole right kidney, previously characterized as hemorrhagic cyst. More subtle enhancing lesion identified anterior lower pole right kidney, previously characterized as suspicious for neoplasm on CT without and with contrast dated 03/28/2020. Both ureters are dilated down to the level of the pelvic sidewall. Bladder is unremarkable. Stomach/Bowel: Stomach is unremarkable. No gastric wall thickening. No evidence of outlet obstruction. Duodenum is normally positioned as is the ligament of Treitz. No small bowel wall thickening. Small bowel is mildly distended up to 3 cm diameter and diffusely fluid-filled. The terminal ileum is normal. The appendix is normal. No gross colonic mass. No colonic wall thickening. Vascular/Lymphatic: There is mild atherosclerotic calcification of the abdominal aorta without aneurysm. Small lymph nodes are seen in the gastrohepatic and hepatoduodenal ligament. Small lymph nodes evident in the retroperitoneal space of the abdomen along the pelvic sidewall bilaterally. Reproductive: Calcified fibroid noted in the uterus. Endometrium not well visualized. There is no adnexal mass. Other: Interval progression ascites with progression of omental soft tissue nodularity now demonstrating areas of confluent "caking" (see midline image 46/2). Enhancing peritoneal nodules are seen adjacent to the inferior liver (35/2) and in the right pelvis (63/2). There are enhancing mesenteric nodules (see central mesentery  on 52/2) with soft tissue nodularity in the region of the umbilicus measuring 2.3 x 2.2 cm on image 54/2. Moderate volume ascites seen around the liver and spleen and along the stomach with moderate volume ascites in the pelvis is well. Musculoskeletal: No worrisome lytic or sclerotic osseous abnormality. IMPRESSION: 1. Interval progression of ascites with progression of omental soft tissue nodularity now demonstrating areas of confluent caking. Peritoneal nodularity is also progressive in the interval. 2. Small bowel is mildly distended and fluid-filled up to 3 cm diameter and diffusely fluid-filled. Imaging features are compatible with ileus. Evolving obstruction not entirely excluded. 3. Interval worsening of mild right and moderate left hydronephrosis with decreased perfusion to the left kidney compatible with obstructive uropathy. Both ureters are obstructed at the level of the pelvic sidewall. 4. Subtle enhancing lesion anterior lower pole right kidney, previously characterized as neoplasm on CT without  and with contrast dated 03/28/2020. 5. Small lymph nodes in the gastrohepatic and hepatoduodenal ligament, in the retroperitoneal space of the abdomen, and along the pelvic sidewall bilaterally. 6. Calcified fibroid in the uterus. Endometrium not well visualized. 7. Aortic Atherosclerosis (ICD10-I70.0). Electronically Signed   By: Misty Stanley M.D.   On: 01/02/2021 11:56   DG Abd Portable 1 View  Result Date: 01/02/2021 CLINICAL DATA:  Check gastric catheter placement EXAM: PORTABLE ABDOMEN - 1 VIEW COMPARISON:  None. FINDINGS: Gastric catheter is noted extending into the stomach. The proximal side port lies just beyond the gastroesophageal junction and could be advanced a few cm as necessary. Visualized lungs are hypoinflated but clear. Right chest wall port is noted. IMPRESSION: Gastric catheter in the stomach as described. Electronically Signed   By: Inez Catalina M.D.   On: 01/02/2021 14:01    I have  reviewed the pertinent records, labs and imaging and discussed the case with Dr. Alvy Bimler.   Assessment/Plan: Bilateral hydronephrosis with advanced endometrial cancer.   Her renal function is stable and is not clear that her pain is related to the obstruction.   I think at this point it would be best to monitor the condition without intervention unless it becomes more clear that her pain is associated with the obstruction or her renal function deteriorates.    Pyuria and hematuria.  This could be a contaminant as she reports no voiding symptoms.   A cath urinalysis could be considered if there is a concern for UTI.       No follow-ups on file.    CC: Dr. Tacey Ruiz and Dr. [name].      Irine Seal 01/02/2021 [phone]

## 2021-01-03 ENCOUNTER — Ambulatory Visit: Payer: 59

## 2021-01-03 ENCOUNTER — Ambulatory Visit (HOSPITAL_COMMUNITY): Payer: 59

## 2021-01-03 ENCOUNTER — Encounter (HOSPITAL_COMMUNITY): Payer: Self-pay

## 2021-01-03 ENCOUNTER — Inpatient Hospital Stay (HOSPITAL_COMMUNITY): Payer: 59

## 2021-01-03 ENCOUNTER — Inpatient Hospital Stay: Payer: 59

## 2021-01-03 DIAGNOSIS — C541 Malignant neoplasm of endometrium: Secondary | ICD-10-CM | POA: Diagnosis not present

## 2021-01-03 LAB — CBC
HCT: 34.3 % — ABNORMAL LOW (ref 36.0–46.0)
Hemoglobin: 11 g/dL — ABNORMAL LOW (ref 12.0–15.0)
MCH: 29 pg (ref 26.0–34.0)
MCHC: 32.1 g/dL (ref 30.0–36.0)
MCV: 90.5 fL (ref 80.0–100.0)
Platelets: 308 10*3/uL (ref 150–400)
RBC: 3.79 MIL/uL — ABNORMAL LOW (ref 3.87–5.11)
RDW: 15.9 % — ABNORMAL HIGH (ref 11.5–15.5)
WBC: 5.7 10*3/uL (ref 4.0–10.5)
nRBC: 0 % (ref 0.0–0.2)

## 2021-01-03 LAB — BASIC METABOLIC PANEL
Anion gap: 11 (ref 5–15)
BUN: 31 mg/dL — ABNORMAL HIGH (ref 8–23)
CO2: 19 mmol/L — ABNORMAL LOW (ref 22–32)
Calcium: 8.4 mg/dL — ABNORMAL LOW (ref 8.9–10.3)
Chloride: 113 mmol/L — ABNORMAL HIGH (ref 98–111)
Creatinine, Ser: 1.15 mg/dL — ABNORMAL HIGH (ref 0.44–1.00)
GFR, Estimated: 53 mL/min — ABNORMAL LOW (ref >60)
Glucose, Bld: 88 mg/dL (ref 70–99)
Potassium: 3 mmol/L — ABNORMAL LOW (ref 3.5–5.1)
Sodium: 143 mmol/L (ref 135–145)

## 2021-01-03 MED ORDER — MAGNESIUM SULFATE 2 GM/50ML IV SOLN
2.0000 g | Freq: Once | INTRAVENOUS | Status: AC
  Administered 2021-01-03: 2 g via INTRAVENOUS
  Filled 2021-01-03: qty 50

## 2021-01-03 MED ORDER — POTASSIUM CHLORIDE IN NACL 40-0.9 MEQ/L-% IV SOLN
INTRAVENOUS | Status: DC
  Filled 2021-01-03 (×3): qty 1000

## 2021-01-03 MED ORDER — POTASSIUM CHLORIDE 10 MEQ/100ML IV SOLN
10.0000 meq | INTRAVENOUS | Status: AC
  Administered 2021-01-03 (×4): 10 meq via INTRAVENOUS
  Filled 2021-01-03: qty 100

## 2021-01-03 MED ORDER — CHLORHEXIDINE GLUCONATE CLOTH 2 % EX PADS
6.0000 | MEDICATED_PAD | Freq: Every day | CUTANEOUS | Status: DC
  Administered 2021-01-03 – 2021-01-19 (×16): 6 via TOPICAL

## 2021-01-03 NOTE — Progress Notes (Signed)
Chaplain was able to have Advanced Directive documents notarized.  Chaplain provided Hume with the original copy and two other copies.  Chaplain also input one copy into her medical chart.    Chaplain provided support.     01/03/21 1400  Clinical Encounter Type  Visited With Patient  Visit Type Follow-up;Social support

## 2021-01-03 NOTE — Progress Notes (Signed)
PROGRESS NOTE    AMII AMBROSI  [id number] DOB: Jun 14, 1955 DOA: 01/02/2021 PCP: Nicolette Bang, MD     Brief Narrative:  Amanda Lin is a 65 y.o. female with medical history significant for endometrial cancer (diagnosed January 2022), diabetes, hypertension, COVID infection early July 2022, who presents to the emergency department on 01/02/2021 with abdominal pain and vomiting. Onset was 3 weeks ago after getting over a Covid infection. She developed constipation at that time. Had lactulose, miralax, but did not help. Pain is located in the periumbilical and episgastric areas and radiates to the rest of the abdomen. It is up to 6/10 at times and is characterized as sharp. She is unable to keep anything down, even water. She is having numerous episodes of vomiting sometimes several times an hour. Vomit is sometimes yellow/green and sometimes dark brown; it smells like rotten fish. CT scan shows ileus with possible evolving obstruction that cannot be entirely excluded, progression of ascites and progression of omental soft tissue nodularity with caking, worsening of bilateral hydronephrosis with obstructive uropathy, and other findings.  Patient was admitted to the hospital, NG tube placed and kept NPO.  Oncology and urology consulted.  New events last 24 hours / Subjective: States that she is feeling much better, pain and nausea are well controlled.  She states that she has had bowel movements this morning.  Assessment & Plan:   Principal Problem:   Endometrial carcinoma ([place]) Active Problems:   Cancer associated pain   Nausea with vomiting   Hypokalemia   Ileus ([facility])   Acute cystitis with hematuria   Obstructive uropathy   Ileus versus early SBO -Resolving -Repeat abdominal x-ray showed normal bowel gas pattern -Patient had bowel sounds and had bowel movements this morning -Clamp NG tube and start on clear liquids and monitor  Pyuria -Urine culture  pending -Empiric Rocephin  Progressive endometrial carcinoma -Appreciate oncology -Planning to start chemotherapy at the end of the week  Bilateral hydronephrosis left greater than right -Appreciate urology  HTN -Oral home medications including Norvasc, Tenormin, Cozaar on hold until patient can take p.o. consistently  CKD stage IIIa -Stable  Hypomagnesia, hypokalemia -Replace, trend  DVT prophylaxis:  enoxaparin (LOVENOX) injection 40 mg Start: 01/02/21 1600 SCDs Start: 01/02/21 1225  Code Status:     Code Status Orders  (From admission, onward)           Start     Ordered   01/02/21 1225  Full code  Continuous        01/02/21 1228           Code Status History     This patient has a current code status but no historical code status.      Family Communication: Daughter at bedside Disposition Plan:  Status is: Inpatient  Remains inpatient appropriate because:Persistent severe electrolyte disturbances and Inpatient level of care appropriate due to severity of illness  Dispo: The patient is from: Home              Anticipated d/c is to: Home              Patient currently is not medically stable to d/c.   Difficult to place patient No      Consultants:  Urology Oncology  Procedures:  None   Antimicrobials:  Anti-infectives (From admission, onward)    Start     Dose/Rate Route Frequency Ordered Stop   01/02/21 1400  cefTRIAXone (ROCEPHIN) 2 g in sodium chloride  0.9 % 100 mL IVPB        2 g 200 mL/hr over 30 Minutes Intravenous Every 24 hours 01/02/21 1256 01/05/21 1359   01/02/21 1300  cefTRIAXone (ROCEPHIN) 1 g in sodium chloride 0.9 % 100 mL IVPB  Status:  Discontinued        1 g 200 mL/hr over 30 Minutes Intravenous Every 12 hours 01/02/21 1248 01/02/21 1254        Objective: Vitals:   01/03/21 0040 01/03/21 0350 01/03/21 0600 01/03/21 0742  BP: (!) 148/63 (!) 135/55  (!) 132/59  Pulse: 70 63  62  Resp: '18 18  16  '$ Temp: 98.2  F (36.8 C) 97.7 F (36.5 C)  98.2 F (36.8 C)  TempSrc: Oral Oral  Oral  SpO2: 99% 98%  97%  Weight:   80.7 kg     Intake/Output Summary (Last 24 hours) at 01/03/2021 1224 Last data filed at 01/03/2021 0500 Gross per 24 hour  Intake 2377.58 ml  Output 650 ml  Net 1727.58 ml   Filed Weights   01/03/21 0600  Weight: 80.7 kg    Examination: General exam: Appears calm and comfortable  Respiratory system: Clear to auscultation. Respiratory effort normal. No respiratory distress. No conversational dyspnea.  Cardiovascular system: S1 & S2 heard, RRR. No murmurs. + Bilateral edema Gastrointestinal system: Abdomen is nondistended, soft and nontender. Normal bowel sounds heard.  NG tube in place Central nervous system: Alert and oriented. No focal neurological deficits. Speech clear.  Extremities: Symmetric in appearance  Skin: No rashes, lesions or ulcers on exposed skin  Psychiatry: Judgement and insight appear normal. Mood & affect appropriate.   Data Reviewed: I have personally reviewed following labs and imaging studies  CBC: Recent Labs  Lab 12/30/20 1407 01/02/21 0931 01/03/21 0440  WBC 8.2 8.8 5.7  NEUTROABS 6.4  --   --   HGB 10.9* 11.8* 11.0*  HCT 33.4* 36.4 34.3*  MCV 88.4 90.1 90.5  PLT 335 359 [id number]   Basic Metabolic Panel: Recent Labs  Lab 12/30/20 1407 01/02/21 0931 01/02/21 2234 01/03/21 0440  NA 136 132*  --  143  K 3.1* 3.1*  --  3.0*  CL 101 101  --  113*  CO2 25 20*  --  19*  GLUCOSE 95 105*  --  88  BUN 27* 34*  --  31*  CREATININE 1.44* 1.30*  --  1.15*  CALCIUM 8.8* 8.5*  --  8.4*  MG 1.6*  --  1.5*  --    GFR: Estimated Creatinine Clearance: 47.6 mL/min (A) (by C-G formula based on SCr of 1.15 mg/dL (H)). Liver Function Tests: Recent Labs  Lab 12/30/20 1407 01/02/21 0931  AST 11* 14*  ALT 7 13  ALKPHOS 84 81  BILITOT 0.5 1.0  PROT 6.3* 6.4*  ALBUMIN 2.9* 2.9*   Recent Labs  Lab 01/02/21 0931  LIPASE 24   No results for  input(s): AMMONIA in the last 168 hours. Coagulation Profile: No results for input(s): INR, PROTIME in the last 168 hours. Cardiac Enzymes: No results for input(s): CKTOTAL, CKMB, CKMBINDEX, TROPONINI in the last 168 hours. BNP (last 3 results) No results for input(s): PROBNP in the last 8760 hours. HbA1C: No results for input(s): HGBA1C in the last 72 hours. CBG: No results for input(s): GLUCAP in the last 168 hours. Lipid Profile: No results for input(s): CHOL, HDL, LDLCALC, TRIG, CHOLHDL, LDLDIRECT in the last 72 hours. Thyroid Function Tests: No results for input(s): TSH,  T4TOTAL, FREET4, T3FREE, THYROIDAB in the last 72 hours. Anemia Panel: No results for input(s): VITAMINB12, FOLATE, FERRITIN, TIBC, IRON, RETICCTPCT in the last 72 hours. Sepsis Labs: No results for input(s): PROCALCITON, LATICACIDVEN in the last 168 hours.  Recent Results (from the past 240 hour(s))  Culture, blood (routine x 2)     Status: None (Preliminary result)   Collection Time: 01/02/21  9:02 PM   Specimen: Left Antecubital; Blood  Result Value Ref Range Status   Specimen Description   Final    LEFT ANTECUBITAL Performed at [facility] [address], [place], [place] [address]    Special Requests   Final    BOTTLES DRAWN AEROBIC ONLY Blood Culture results may not be optimal due to an inadequate volume of blood received in culture bottles Performed at [facility] [address] [place], [place] [address]    Culture   Final    NO GROWTH < 12 HOURS Performed at [facility] [address] [place], [place] [address]    Report Status PENDING  Incomplete      Radiology Studies: DG Abd 1 View  Result Date: 01/03/2021 CLINICAL DATA:  Ileus EXAM: ABDOMEN - 1 VIEW COMPARISON:  Portable exam 0848 hours compared to 01/02/2021 FINDINGS: Tip of nasogastric tube projects over stomach. Excreted contrast material within urinary bladder with faint contrast  bilaterally in dilated renal collecting systems. Single nonspecific air-filled nondistended small bowel loops in mid abdomen. Bowel gas pattern otherwise normal. Osseous structures unremarkable. Surgical clips RIGHT upper quadrant likely reflect prior cholecystectomy. IMPRESSION: BILATERAL hydronephrosis. Normal bowel gas pattern. Electronically Signed   By: Lavonia Dana M.D.   On: 01/03/2021 10:44   CT ABDOMEN PELVIS W CONTRAST  Result Date: 01/02/2021 CLINICAL DATA:  Endometrial cancer.  Bowel obstruction suspected. EXAM: CT ABDOMEN AND PELVIS WITH CONTRAST TECHNIQUE: Multidetector CT imaging of the abdomen and pelvis was performed using the standard protocol following bolus administration of intravenous contrast. CONTRAST:  73m OMNIPAQUE IOHEXOL 350 MG/ML SOLN COMPARISON:  11/02/2020 FINDINGS: Lower chest: Basilar atelectasis. Hepatobiliary: No suspicious focal abnormality within the liver parenchyma. Gallbladder is surgically absent. No intrahepatic or extrahepatic biliary dilation. Pancreas: No focal mass lesion. No dilatation of the main duct. No intraparenchymal cyst. No peripancreatic edema. Spleen: No splenomegaly. No focal mass lesion. Adrenals/Urinary Tract: No adrenal nodule or mass. Mild right and moderate left hydronephrosis evident with decreased perfusion to the left kidney. 1.3 cm lesion lower pole right kidney, previously characterized as hemorrhagic cyst. More subtle enhancing lesion identified anterior lower pole right kidney, previously characterized as suspicious for neoplasm on CT without and with contrast dated 03/28/2020. Both ureters are dilated down to the level of the pelvic sidewall. Bladder is unremarkable. Stomach/Bowel: Stomach is unremarkable. No gastric wall thickening. No evidence of outlet obstruction. Duodenum is normally positioned as is the ligament of Treitz. No small bowel wall thickening. Small bowel is mildly distended up to 3 cm diameter and diffusely fluid-filled. The  terminal ileum is normal. The appendix is normal. No gross colonic mass. No colonic wall thickening. Vascular/Lymphatic: There is mild atherosclerotic calcification of the abdominal aorta without aneurysm. Small lymph nodes are seen in the gastrohepatic and hepatoduodenal ligament. Small lymph nodes evident in the retroperitoneal space of the abdomen along the pelvic sidewall bilaterally. Reproductive: Calcified fibroid noted in the uterus. Endometrium not well visualized. There is no adnexal mass. Other: Interval progression ascites with progression of omental soft tissue nodularity now demonstrating areas  of confluent "caking" (see midline image 46/2). Enhancing peritoneal nodules are seen adjacent to the inferior liver (35/2) and in the right pelvis (63/2). There are enhancing mesenteric nodules (see central mesentery on 52/2) with soft tissue nodularity in the region of the umbilicus measuring 2.3 x 2.2 cm on image 54/2. Moderate volume ascites seen around the liver and spleen and along the stomach with moderate volume ascites in the pelvis is well. Musculoskeletal: No worrisome lytic or sclerotic osseous abnormality. IMPRESSION: 1. Interval progression of ascites with progression of omental soft tissue nodularity now demonstrating areas of confluent caking. Peritoneal nodularity is also progressive in the interval. 2. Small bowel is mildly distended and fluid-filled up to 3 cm diameter and diffusely fluid-filled. Imaging features are compatible with ileus. Evolving obstruction not entirely excluded. 3. Interval worsening of mild right and moderate left hydronephrosis with decreased perfusion to the left kidney compatible with obstructive uropathy. Both ureters are obstructed at the level of the pelvic sidewall. 4. Subtle enhancing lesion anterior lower pole right kidney, previously characterized as neoplasm on CT without and with contrast dated 03/28/2020. 5. Small lymph nodes in the gastrohepatic and  hepatoduodenal ligament, in the retroperitoneal space of the abdomen, and along the pelvic sidewall bilaterally. 6. Calcified fibroid in the uterus. Endometrium not well visualized. 7. Aortic Atherosclerosis (ICD10-I70.0). Electronically Signed   By: Misty Stanley M.D.   On: 01/02/2021 11:56   DG Abd Portable 1 View  Result Date: 01/02/2021 CLINICAL DATA:  Check gastric catheter placement EXAM: PORTABLE ABDOMEN - 1 VIEW COMPARISON:  None. FINDINGS: Gastric catheter is noted extending into the stomach. The proximal side port lies just beyond the gastroesophageal junction and could be advanced a few cm as necessary. Visualized lungs are hypoinflated but clear. Right chest wall port is noted. IMPRESSION: Gastric catheter in the stomach as described. Electronically Signed   By: Inez Catalina M.D.   On: 01/02/2021 14:01      Scheduled Meds:  Chlorhexidine Gluconate Cloth  6 each Topical Daily   enoxaparin (LOVENOX) injection  40 mg Subcutaneous Q24H   Continuous Infusions:  0.9 % NaCl with KCl 40 mEq / L 125 mL/hr at 01/03/21 0941   cefTRIAXone (ROCEPHIN)  IV 2 g (01/02/21 1415)   potassium chloride 10 mEq (01/03/21 1131)     LOS: 1 day      Time spent: 30 minutes   Dessa Phi, DO [facility] 01/03/2021, 12:24 PM   Available via Epic secure chat 7am-7pm After these hours, please refer to coverage provider listed on [email or web address]

## 2021-01-03 NOTE — Plan of Care (Signed)

## 2021-01-03 NOTE — Progress Notes (Signed)
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[facility]) The patient continues to have difficulties tolerating oral intake due to poor appetite, recurrent sensation of heartburn, nausea and changes in bowel habit She has been receiving supportive care with IV fluids in our office and has previously declined admission CT scan 8/29 consistent with disease progression Will consider palliative chemotherapy in the hospital Friday if condition improves I explained to the patient and her daughter that her condition will likely deteriorate without chemotherapy I prefer to wait at least 3 days to allow IV antibiotics for presumed UTI based on her abnormal urinalysis.  Urine culture is pending If she feels better by Friday morning, I will get her started on carboplatin, paclitaxel and Herceptin Carboplatin and paclitaxel was discontinued after treatment in June due to severe pancytopenia and nadired response seen on CT imaging Technically, she did not progress on carboplatin and paclitaxel We reviewed the NCCN guidelines We discussed the role of chemotherapy. The intent is of palliative intent.  We discussed some of the risks, benefits, side-effects of carboplatin & Taxol. Treatment is intravenous, every 3 weeks   Some of the short term side-effects included, though not limited to, including weight loss, life threatening infections, risk of allergic reactions, need for transfusions of blood products, nausea, vomiting, change in bowel habits, loss of hair, admission to hospital for various reasons, and risks of death.   Long term side-effects are also discussed including risks of infertility, permanent damage to nerve function, hearing loss, chronic fatigue, kidney damage with possibility needing hemodialysis, and rare  secondary malignancy including bone marrow disorders.  The patient is aware that the response rates discussed earlier is not guaranteed.  After a long discussion, patient made an informed decision to proceed with the prescribed plan of care.   Patient education material was dispensed.   Cancer associated pain She has multifactorial pain I recommend pain medicine as needed   Nausea with vomiting/ileus She continues to have severe nausea and vomiting Recommend IV fluids and antiemetics No recurrent nausea or vomiting since NG tube placement Abdominal x-ray was taken this morning but not yet read  Hydronephrosis Monitor renal function closely Monitor for now  UTI Recommend IV antibiotics for at least 3 days Follow-up on urine culture results    Hypokalemia, inadequate intake Replete per hospital  Goals of care Resolution of ileus, nausea vomiting, and UTI I reviewed palliative intent of treatment with the patient and her daughter and they are willing to try She is aware, if despite chemotherapy, that if she did not improve within 2 to 3 weeks time, her condition will be considered terminal at that point in time  Discharge planning Anticipate that she will be in the hospital for at least 5-7 days  Amanda Bussing, NP 01/03/2021 7:52 AM  INTERVAL HISTORY: The patient reports improvement in her abdominal pain Feels that her abdomen is less distended at this time No nausea or vomiting since NG tube is in place Reports 2 liquid bowel movements this morning  REVIEW OF SYSTEMS:   Constitutional: Denies fevers, chills  Eyes: Denies blurriness of vision Ears, nose, mouth, throat, and face: Denies mucositis or sore throat Respiratory: Denies cough, dyspnea or wheezes Cardiovascular: Denies palpitation, chest discomfort, reports increased lower extremity swelling Skin: Denies abnormal skin rashes Lymphatics: Denies new lymphadenopathy or easy  bruising Behavioral/Psych: Mood is  stable, no new changes  All other systems were reviewed with the patient and are negative.  I have reviewed the past medical history, past surgical history, social history and family history with the patient and they are unchanged from previous note.  ALLERGIES:  has No Known Allergies.  MEDICATIONS:  Current Facility-Administered Medications  Medication Dose Route Frequency Provider Last Rate Last Admin   0.9 % NaCl with KCl 40 mEq / L  infusion   Intravenous Continuous Dessa Phi, DO       acetaminophen (TYLENOL) tablet 650 mg  650 mg Oral Q6H PRN Tacey Ruiz, MD       Or   acetaminophen (TYLENOL) suppository 650 mg  650 mg Rectal Q6H PRN Tacey Ruiz, MD       bisacodyl (DULCOLAX) EC tablet 5 mg  5 mg Oral Daily PRN Tacey Ruiz, MD       cefTRIAXone (ROCEPHIN) 2 g in sodium chloride 0.9 % 100 mL IVPB  2 g Intravenous Q24H Tacey Ruiz, MD 200 mL/hr at 01/02/21 1415 2 g at 01/02/21 1415   Chlorhexidine Gluconate Cloth 2 % PADS 6 each  6 each Topical Daily Dessa Phi, DO       enoxaparin (LOVENOX) injection 40 mg  40 mg Subcutaneous Q24H Tacey Ruiz, MD   40 mg at 01/02/21 1622   metoprolol tartrate (LOPRESSOR) injection 5 mg  5 mg Intravenous Q6H PRN Tacey Ruiz, MD       morphine 2 MG/ML injection 2 mg  2 mg Intravenous Q2H PRN Tacey Ruiz, MD   2 mg at 01/03/21 0328   ondansetron [facility]) injection 4 mg  4 mg Intravenous Q6H PRN Tacey Ruiz, MD   4 mg at 01/03/21 1884   potassium chloride 10 mEq in 100 mL IVPB  10 mEq Intravenous Q1 Hr x 4 Dessa Phi, DO       prochlorperazine (COMPAZINE) injection 10 mg  10 mg Intravenous Q6H PRN Tacey Ruiz, MD   10 mg at 01/02/21 1622   Facility-Administered Medications Ordered in Other Encounters  Medication Dose Route Frequency Provider Last Rate Last Admin   ondansetron (ZOFRAN) 4 MG/2ML injection             SUMMARY OF ONCOLOGIC HISTORY: Oncology History Overview Note  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     02/26/2020 Imaging   1. 3 mm nonobstructing renal stone within the left kidney. 2. Findings suggestive of a small hemorrhagic cyst within the lower pole of the right kidney, with an anterior right lower pole heterogeneous renal soft tissue mass. MRI correlation is recommended, as an underlying neoplastic process cannot be excluded. 3. Colonic diverticulosis. 4. Multiple exophytic uterine fibroids. 5. Evidence of prior cholecystectomy.   03/28/2020 Imaging   2.4 cm enhancing mass in the anterior right lower kidney, suspicious for solid renal neoplasm such as renal cell carcinoma.   Single right renal artery and vein.  No renal vein invasion.   Small retroperitoneal nodes, measuring up to 9 mm short axis, mildly prominent but technically within the upper limits of normal. Attention on follow-up is suggested.   4 x 5 mm nodule in the medial right lower lobe. This is nonspecific and unlikely to reflect a metastasis but warrants attention on follow-up. Consider dedicated CT chest in 3-6 months.   3  mm nonobstructing left lower pole renal calculus. No hydronephrosis.     04/04/2020 Imaging   1. 10 mm endometrial stripe thickness. This is considered abnormal in a postmenopausal female with bleeding. In the setting of post-menopausal bleeding, endometrial sampling is indicated to exclude carcinoma.  2. Multiple uterine fibroids including potential submucosal fibroid in the lower uterine segment.   04/19/2020 Pathology Results   A. ENDOMETRIUM, BIOPSY:  -  High-grade carcinoma  Immunohistochemical and morphometric analysis performed manually   The tumor cells are POSITIVE for Her2 (3+).    04/19/2020 Procedure   She was seen by Dr. Astrid Drafts on 04/19/2020.  At that visit her cervix was noted to be nodular and firm and  abnormal appearing.  Pap taken at that visit was positive for adenocarcinoma, endometrial.  An endometrial [place] biopsy was taken at the same visit which revealed high-grade endometrial cancer, suspicious for serous carcinoma.     05/12/2020 Cancer Staging   Staging form: Corpus Uteri - Carcinoma and Carcinosarcoma, AJCC 8th Edition - Clinical stage from 05/12/2020: FIGO Stage IVB (cT3, cN2, cM1) - Signed by [name], MD on 05/31/2020   05/30/2020 PET scan   1. Hypermetabolic lymph nodes identified in the left supraclavicular region/thoracic inlet, mediastinum, hilar regions, right retrocrural space, para-aortic retroperitoneum, and bilateral pelvic sidewall, compatible with metastatic disease. 2. Moderate volume ascites on this noncontrast CT study largely obscures peritoneal and omental disease although hypermetabolic plaque-like uptake in the anterior high abdomen is probably peritoneal with relatively bulky hypermetabolism in the omentum. These findings are also compatible with metastatic disease. 3. Hypermetabolic left upper lobe pulmonary nodule consistent with metastatic disease. Lung primary is considered less likely but not excluded. 4. Scattered areas of focal marrow uptake are suspicious for bony metastases although no underlying abnormality is evident on CT imaging. 5. Diffuse uptake in the uterine anatomy compatible with the patient's known history of endometrial carcinoma. 6. As above, moderate volume ascites is new in the interval. 7. Enhancing lesion lower pole right kidney seen on previous CT of 03/28/2020 is less conspicuous on today's noncontrast CT imaging. Diffuse normal background FDG accumulation could obscure hypermetabolism in this lesion previously characterized as suspicious for renal cell carcinoma. 8. Bibasilar collapse/consolidation in the lower lobes.   06/01/2020 Procedure   Ultrasound and fluoroscopically guided right internal jugular single lumen power port catheter  insertion. Tip in the SVC/RA junction. Catheter ready for use.   06/02/2020 Echocardiogram    1. GLS -18.2%.  2. Left ventricular ejection fraction, by estimation, is 60 to 65%. The left ventricle has normal function. The left ventricle has no regional wall motion abnormalities. There is mild left ventricular hypertrophy. Left ventricular diastolic parameters are consistent with Grade I diastolic dysfunction (impaired relaxation).  3. Right ventricular systolic function is normal. The right ventricular size is normal.  4. The mitral valve is normal in structure. No evidence of mitral valve regurgitation. No evidence of mitral stenosis.  5. The aortic valve is tricuspid. Aortic valve regurgitation is not visualized. No aortic stenosis is present.  6. The inferior vena cava is normal in size with greater than 50% respiratory variability, suggesting right atrial pressure of 3 mmHg.     06/03/2020 -  Chemotherapy    Patient is on Treatment Plan: UTERINE SEROUS CARCINOMA CARBOPLATIN + PACLITAXEL + TRASTUZUMAB Q21D X 6 CYCLES / TRASTUZUMAB Q21D       07/25/2020 Imaging   1. Interval response to therapy. There has been interval decrease in size of mediastinal,  retrocrural, retroperitoneal, and left pelvic sidewall lymph nodes. 2. Decreased volume of ascites. Interval decrease in thickness of omental cake secondary to peritoneal disease. 3. Decrease in size of FDG avid tumor within the umbilicus. 4. Decrease in size of left upper lobe lung nodule. 5. Persistent left-sided hydronephrosis and hydroureter. No obstructing stone identified. 6. Previously noted solid enhancing lesion arising off the anterior cortex of the inferior pole of the right kidney is again noted and remains worrisome for renal cell carcinoma. 7. Aortic atherosclerosis.     08/08/2020 Echocardiogram    1. Left ventricular ejection fraction, by estimation, is 65 to 70%. The left ventricle has normal function. The left ventricle has no  regional wall motion abnormalities. There is mild concentric left ventricular hypertrophy of the basal-septal segment. Left ventricular diastolic parameters are consistent with Grade I diastolic dysfunction (impaired relaxation). The average left ventricular global longitudinal strain is -18.5 %. The global longitudinal strain is normal.  2. Right ventricular systolic function is normal. The right ventricular size is normal.  3. The mitral valve is normal in structure. No evidence of mitral valve regurgitation.  4. The aortic valve is tricuspid. Aortic valve regurgitation is not visualized. No aortic stenosis is present.  5. The inferior vena cava is normal in size with greater than 50% respiratory variability, suggesting right atrial pressure of 3 mmHg.   Comparison(s): A prior study was performed on 06/02/2020. No significant change from prior study. Prior images reviewed side by side. Similar LV deformation and strain and improved loading conditions (improved blood pressure).   11/03/2020 Imaging   1. Overall no significant interval change in exam. 2. Signs of peritoneal carcinomatosis are again noted with perihepatic ascites and omental caking. Not significantly changed in the interval. 3. Slight decrease in size left supraclavicular and mediastinal lymph nodes. Stable borderline retroperitoneal and left pelvic lymph nodes. 4. Small pulmonary nodules are stable. 5. Stable appearance of solid enhancing lesion arising off the anterior cortex of the inferior pole of right kidney compatible with renal cell carcinoma. 6. Stable appearance of mild left hydronephrosis and left hydroureter to just before the urinary bladder. No obstructing stone identified. 7. Aortic atherosclerosis.   11/16/2020 Echocardiogram    1. Left ventricular ejection fraction, by estimation, is 55 to 60%. The left ventricle has normal function. The left ventricle has no regional wall motion abnormalities. There is moderate left  ventricular hypertrophy. Left ventricular diastolic parameters are consistent with Grade I diastolic dysfunction (impaired relaxation). The average left ventricular global longitudinal strain is -18.0 %. The global longitudinal strain is normal.  2. Right ventricular systolic function is normal. The right ventricular size is normal. Tricuspid regurgitation signal is inadequate for assessing PA pressure.  3. The mitral valve is normal in structure. No evidence of mitral valve regurgitation. No evidence of mitral stenosis.  4. The aortic valve is tricuspid. Aortic valve regurgitation is not visualized. Mild aortic valve sclerosis is present, with no evidence of aortic valve stenosis.  5. The inferior vena cava is normal in size with greater than 50% respiratory variability, suggesting right atrial pressure of 3 mmHg.     01/02/2021 Imaging   1. Interval progression of ascites with progression of omental soft tissue nodularity now demonstrating areas of confluent caking. Peritoneal nodularity is also progressive in the interval. 2. Small bowel is mildly distended and fluid-filled up to 3 cm diameter and diffusely fluid-filled. Imaging features are compatible with ileus. Evolving obstruction not entirely excluded. 3. Interval worsening of mild  right and moderate left hydronephrosis with decreased perfusion to the left kidney compatible with obstructive uropathy. Both ureters are obstructed at the level of the pelvic sidewall. 4. Subtle enhancing lesion anterior lower pole right kidney, previously characterized as neoplasm on CT without and with contrast dated 03/28/2020. 5. Small lymph nodes in the gastrohepatic and hepatoduodenal ligament, in the retroperitoneal space of the abdomen, and along the pelvic sidewall bilaterally. 6. Calcified fibroid in the uterus. Endometrium not well visualized. 7. Aortic Atherosclerosis (ICD10-I70.0).   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[facility])  05/31/2020 Initial Diagnosis   Metastasis to lung ([place])   06/03/2020 -  Chemotherapy    Patient is on Treatment Plan: UTERINE SEROUS CARCINOMA CARBOPLATIN + PACLITAXEL + TRASTUZUMAB Q21D X 6 CYCLES / TRASTUZUMAB Q21D         PHYSICAL EXAMINATION: ECOG PERFORMANCE STATUS: 3 - Symptomatic, >50% confined to bed GENERAL:alert, no distress and comfortable ABD: Positive bowel sounds, abdomen less distended today, soft EXT: Pitting edema bilaterally NEURO: alert & oriented x 3 with fluent speech, no focal motor/sensory deficits  LABORATORY DATA:  I have reviewed the data as listed    Component Value Date/Time   NA 143 01/03/2021 0440   NA 138 02/23/2020 1113   K 3.0 (L) 01/03/2021 0440   CL 113 (H) 01/03/2021 0440   CO2 19 (L) 01/03/2021 0440   GLUCOSE 88 01/03/2021 0440   BUN 31 (H) 01/03/2021 0440   BUN 15 02/23/2020 1113   CREATININE 1.15 (H) 01/03/2021 0440   CREATININE 1.44 (H) 12/30/2020 1407   CALCIUM 8.4 (L) 01/03/2021 0440   PROT 6.4 (L) 01/02/2021 0931   PROT 7.8 02/23/2020 1113   ALBUMIN 2.9 (L) 01/02/2021 0931   ALBUMIN 4.4 02/23/2020 1113   AST 14 (L) 01/02/2021 0931   AST 11 (L) 12/30/2020 1407   ALT 13 01/02/2021 0931   ALT 7 12/30/2020 1407   ALKPHOS 81 01/02/2021 0931   BILITOT 1.0 01/02/2021 0931   BILITOT 0.5 12/30/2020 1407   GFRNONAA 53 (L) 01/03/2021 0440   GFRNONAA 41 (L) 12/30/2020 1407   GFRAA 91 02/23/2020 1113    No results found for: SPEP, UPEP  Lab Results  Component Value Date   WBC 5.7 01/03/2021   NEUTROABS 6.4 12/30/2020   HGB 11.0 (L) 01/03/2021   HCT 34.3 (L) 01/03/2021   MCV 90.5 01/03/2021   PLT 308 01/03/2021      Chemistry      Component Value Date/Time   NA 143 01/03/2021 0440   NA 138  02/23/2020 1113   K 3.0 (L) 01/03/2021 0440   CL 113 (H) 01/03/2021 0440   CO2 19 (L) 01/03/2021 0440   BUN 31 (H) 01/03/2021 0440   BUN 15 02/23/2020 1113   CREATININE 1.15 (H) 01/03/2021 0440   CREATININE 1.44 (H) 12/30/2020 1407      Component Value Date/Time   CALCIUM 8.4 (L) 01/03/2021 0440   ALKPHOS 81 01/02/2021 0931   AST 14 (L) 01/02/2021 0931   AST 11 (L) 12/30/2020 1407   ALT 13 01/02/2021 0931   ALT 7 12/30/2020 1407   BILITOT 1.0 01/02/2021 0931   BILITOT 0.5 12/30/2020 1407

## 2021-01-04 DIAGNOSIS — C541 Malignant neoplasm of endometrium: Secondary | ICD-10-CM | POA: Diagnosis not present

## 2021-01-04 DIAGNOSIS — G893 Neoplasm related pain (acute) (chronic): Secondary | ICD-10-CM | POA: Diagnosis not present

## 2021-01-04 DIAGNOSIS — N1831 Chronic kidney disease, stage 3a: Secondary | ICD-10-CM

## 2021-01-04 DIAGNOSIS — E876 Hypokalemia: Secondary | ICD-10-CM | POA: Diagnosis not present

## 2021-01-04 DIAGNOSIS — N3001 Acute cystitis with hematuria: Secondary | ICD-10-CM | POA: Diagnosis not present

## 2021-01-04 LAB — BASIC METABOLIC PANEL
Anion gap: 7 (ref 5–15)
BUN: 26 mg/dL — ABNORMAL HIGH (ref 8–23)
CO2: 21 mmol/L — ABNORMAL LOW (ref 22–32)
Calcium: 8.4 mg/dL — ABNORMAL LOW (ref 8.9–10.3)
Chloride: 115 mmol/L — ABNORMAL HIGH (ref 98–111)
Creatinine, Ser: 1.05 mg/dL — ABNORMAL HIGH (ref 0.44–1.00)
GFR, Estimated: 59 mL/min — ABNORMAL LOW (ref >60)
Glucose, Bld: 94 mg/dL (ref 70–99)
Potassium: 4.3 mmol/L (ref 3.5–5.1)
Sodium: 143 mmol/L (ref 135–145)

## 2021-01-04 LAB — MAGNESIUM: Magnesium: 1.9 mg/dL (ref 1.7–2.4)

## 2021-01-04 LAB — URINE CULTURE: Culture: <10000 — AB

## 2021-01-04 MED ORDER — LACTATED RINGERS IV SOLN: INTRAVENOUS | Status: DC

## 2021-01-04 MED ORDER — ALUM & MAG HYDROXIDE-SIMETH 200-200-20 MG/5ML PO SUSP
30.0000 mL | Freq: Three times a day (TID) | ORAL | Status: DC | PRN
  Administered 2021-01-05 (×2): 30 mL via ORAL
  Filled 2021-01-04 (×2): qty 30

## 2021-01-04 MED ORDER — PANTOPRAZOLE SODIUM 40 MG IV SOLR
40.0000 mg | INTRAVENOUS | Status: DC
  Administered 2021-01-04 – 2021-01-19 (×16): 40 mg via INTRAVENOUS
  Filled 2021-01-04 (×14): qty 40

## 2021-01-04 NOTE — Progress Notes (Signed)
   01/04/21 1200  Mobility  Activity Ambulated in hall;Transferred to/from [facility]  Level of Assistance Contact guard assist, steadying assist  Assistive Device Front wheel walker  Distance Ambulated (ft) 40 ft  Mobility Ambulated with assistance in hallway  Mobility Response Tolerated well  Mobility performed by Mobility specialist  $Mobility charge 1 Mobility   Pt eager to ambulate upon entering the room. She was up in her chair, and family was present. Prior to ambulation, pt was transferred to the [facility]. Pt ambulated in the hall with RW about 53f. Pt stated she felt a little lightheaded. Took 1 rest break to regain her composure. No other complaints. Left pt in bed with call bell at side. She requested a purewick while she is in bed. Notified nurse of session and request.    KRudell CobbMobility Specialist Acute Rehab Services Office: [phone]

## 2021-01-04 NOTE — Progress Notes (Signed)
PROGRESS NOTE  Amanda Lin [id number] DOB: Sep 27, 1955   PCP: Nicolette Bang, MD  Patient is from: Home  DOA: 01/02/2021 LOS: 2  Chief complaints:  Chief Complaint  Patient presents with   Abdominal Pain   Constipation     Brief Narrative / Interim history: 65 year old F with PMH of endometrial cancer, DM-2, HTN and COVID-19 infection in 11/2020 presenting with abdominal pain and emesis for about 3 weeks.  CT abdomen and pelvis showed ileus with possible evolving bowel obstruction, progression of ascites and omental soft tissue nodularity with caking, worsening bilateral hydronephrosis with obstructive uropathy and other findings as noted on CT abdomen and pelvis.  Patient was started on NG tube.  Oncology consulted and planning chemotherapy once UTI and bowel obstruction improved.  Urology consulted about bilateral hydronephrosis I recommended supportive care as long as renal function remained stable.  Subjective: Seen and examined earlier this morning.  No major events overnight of this morning.  Feels better.  Reports soft stools and flatus.  She denies nausea or vomiting.  Abdominal pain improved.  Likes to have NG tube out.  Tolerating clear liquid diet.  She denies chest pain or dyspnea.  Objective: Vitals:   01/03/21 0742 01/03/21 1345 01/03/21 2050 01/04/21 0518  BP: (!) 132/59 139/66 (!) 141/58 (!) 148/54  Pulse: 62 65 62 66  Resp: '16 20 18 16  '$ Temp: 98.2 F (36.8 C) 97.9 F (36.6 C) 97.8 F (36.6 C) 97.7 F (36.5 C)  TempSrc: Oral Oral Oral Oral  SpO2: 97% 99% 97% 99%  Weight:    80.2 kg    Intake/Output Summary (Last 24 hours) at 01/04/2021 1546 Last data filed at 01/04/2021 1500 Gross per 24 hour  Intake 2516.06 ml  Output 700 ml  Net 1816.06 ml   Filed Weights   01/03/21 0600 01/04/21 0518  Weight: 80.7 kg 80.2 kg    Examination:  GENERAL: No apparent distress.  Nontoxic. HEENT: MMM.  Vision and hearing grossly intact.  NG tube  clamped. NECK: Supple.  No apparent JVD.  RESP:  No IWOB.  Fair aeration bilaterally. CVS:  RRR. Heart sounds normal.  ABD/GI/GU: BS+. Abd soft.  Mild diffuse tenderness.  No rebound or guarding. MSK/EXT:  Moves extremities. No apparent deformity. No edema.  SKIN: no apparent skin lesion or wound NEURO: Awake, alert and oriented appropriately.  No apparent focal neuro deficit. PSYCH: Calm. Normal affect.   Procedures:  Nasogastric tube  Microbiology summarized: Blood cultures NGTD. Urine culture with insignificant growth.  Assessment & Plan: Ileus versus early SBO-resolving. -Repeat abdominal x-ray showed normal bowel gas pattern -Continues to have bowel movements and flatus. -Discontinue NG tube -Continue clear liquid diet.  Will advance to full liquid diet in the morning -Mobilize in the hall. -Change NS/KCl to LR. -PPI and GI cocktail   Acute urinary tract infection: Presented with dysuria that seems to have resolved.UA with pyuria.  Urine culture with insignificant growth. -Completed 3 doses of IV ceftriaxone empirically  Progressive endometrial carcinoma -Appreciate oncology -Planning to start chemotherapy at the end of the week  Bilateral hydronephrosis left greater than right -Appreciate urology   Essential hypertension: Normotensive. -Continue holding home Norvasc, Tenormin, Cozaar  CKD-3A/azotemia Recent Labs    09/22/20 0857 10/20/20 1004 11/02/20 1025 11/17/20 0932 12/22/20 1219 12/27/20 1139 12/30/20 1407 01/02/21 0931 01/03/21 0440 01/04/21 0543  BUN '13 8 11 8 18 '$ 30* 27* 34* 31* 26*  CREATININE 1.49* 0.86 0.77 0.77 1.14* 1.45* 1.44* 1.30* 1.15*  1.05*  -Continue monitoring    Hypokalemia/hypomagnesemia: Resolved. -Change IVF as above. -Recheck in the morning  Class I obesity Body mass index is 33.41 kg/m.         DVT prophylaxis:  enoxaparin (LOVENOX) injection 40 mg Start: 01/02/21 1600 SCDs Start: 01/02/21 1225  Code Status: Full  code Family Communication: Updated patient's family at bedside. Level of care: Med-Surg Status is: Inpatient  Remains inpatient appropriate because:Ongoing diagnostic testing needed not appropriate for outpatient work up, IV treatments appropriate due to intensity of illness or inability to take PO, and Inpatient level of care appropriate due to severity of illness  Dispo: The patient is from: Home              Anticipated d/c is to: Home              Patient currently is not medically stable to d/c.   Difficult to place patient No       Consultants:  Oncology Urology   Sch Meds:  Scheduled Meds:  Chlorhexidine Gluconate Cloth  6 each Topical Daily   enoxaparin (LOVENOX) injection  40 mg Subcutaneous Q24H   pantoprazole (PROTONIX) IV  40 mg Intravenous Q24H   Continuous Infusions:  lactated ringers 100 mL/hr at 01/04/21 0810   PRN Meds:.acetaminophen **OR** acetaminophen, alum & mag hydroxide-simeth, bisacodyl, metoprolol tartrate, morphine injection, [DISCONTINUED] ondansetron **OR** ondansetron (ZOFRAN) IV, prochlorperazine  Antimicrobials: Anti-infectives (From admission, onward)    Start     Dose/Rate Route Frequency Ordered Stop   01/02/21 1400  cefTRIAXone (ROCEPHIN) 2 g in sodium chloride 0.9 % 100 mL IVPB        2 g 200 mL/hr over 30 Minutes Intravenous Every 24 hours 01/02/21 1256 01/04/21 1342   01/02/21 1300  cefTRIAXone (ROCEPHIN) 1 g in sodium chloride 0.9 % 100 mL IVPB  Status:  Discontinued        1 g 200 mL/hr over 30 Minutes Intravenous Every 12 hours 01/02/21 1248 01/02/21 1254        I have personally reviewed the following labs and images: CBC: Recent Labs  Lab 12/30/20 1407 01/02/21 0931 01/03/21 0440  WBC 8.2 8.8 5.7  NEUTROABS 6.4  --   --   HGB 10.9* 11.8* 11.0*  HCT 33.4* 36.4 34.3*  MCV 88.4 90.1 90.5  PLT 335 359 308   BMP &GFR Recent Labs  Lab 12/30/20 1407 01/02/21 0931 01/02/21 2234 01/03/21 0440 01/04/21 0543  NA 136  132*  --  143 143  K 3.1* 3.1*  --  3.0* 4.3  CL 101 101  --  113* 115*  CO2 25 20*  --  19* 21*  GLUCOSE 95 105*  --  88 94  BUN 27* 34*  --  31* 26*  CREATININE 1.44* 1.30*  --  1.15* 1.05*  CALCIUM 8.8* 8.5*  --  8.4* 8.4*  MG 1.6*  --  1.5*  --  1.9   Estimated Creatinine Clearance: 52 mL/min (A) (by C-G formula based on SCr of 1.05 mg/dL (H)). Liver & Pancreas: Recent Labs  Lab 12/30/20 1407 01/02/21 0931  AST 11* 14*  ALT 7 13  ALKPHOS 84 81  BILITOT 0.5 1.0  PROT 6.3* 6.4*  ALBUMIN 2.9* 2.9*   Recent Labs  Lab 01/02/21 0931  LIPASE 24   No results for input(s): AMMONIA in the last 168 hours. Diabetic: No results for input(s): HGBA1C in the last 72 hours. No results for input(s): GLUCAP in the last  168 hours. Cardiac Enzymes: No results for input(s): CKTOTAL, CKMB, CKMBINDEX, TROPONINI in the last 168 hours. No results for input(s): PROBNP in the last 8760 hours. Coagulation Profile: No results for input(s): INR, PROTIME in the last 168 hours. Thyroid Function Tests: No results for input(s): TSH, T4TOTAL, FREET4, T3FREE, THYROIDAB in the last 72 hours. Lipid Profile: No results for input(s): CHOL, HDL, LDLCALC, TRIG, CHOLHDL, LDLDIRECT in the last 72 hours. Anemia Panel: No results for input(s): VITAMINB12, FOLATE, FERRITIN, TIBC, IRON, RETICCTPCT in the last 72 hours. Urine analysis:    Component Value Date/Time   COLORURINE YELLOW 01/02/2021 0931   APPEARANCEUR CLOUDY (A) 01/02/2021 0931   LABSPEC 1.015 01/02/2021 0931   PHURINE 5.0 01/02/2021 0931   GLUCOSEU NEGATIVE 01/02/2021 0931   HGBUR MODERATE (A) 01/02/2021 0931   BILIRUBINUR NEGATIVE 01/02/2021 0931   BILIRUBINUR moderate (A) 02/23/2020 0946   KETONESUR 5 (A) 01/02/2021 0931   PROTEINUR 100 (A) 01/02/2021 0931   UROBILINOGEN 0.2 02/23/2020 0946   NITRITE NEGATIVE 01/02/2021 0931   LEUKOCYTESUR LARGE (A) 01/02/2021 0931   Sepsis Labs: Invalid input(s): PROCALCITONIN,  [place]  Microbiology: Recent Results (from the past 240 hour(s))  Urine Culture     Status: Abnormal   Collection Time: 01/02/21 12:35 PM   Specimen: Urine, Clean Catch  Result Value Ref Range Status   Specimen Description   Final    URINE, CLEAN CATCH Performed at [facility], [place] [address] [place], [place] [address]    Special Requests   Final    NONE Performed at [facility], [place] [address], [place], [place] [address]    Culture (A)  Final    <10,000 COLONIES/mL INSIGNIFICANT GROWTH Performed at [place] [address], [place], [place] [address]    Report Status 01/04/2021 FINAL  Final  Culture, blood (routine x 2)     Status: None (Preliminary result)   Collection Time: 01/02/21  9:02 PM   Specimen: Left Antecubital; Blood  Result Value Ref Range Status   Specimen Description   Final    LEFT ANTECUBITAL Performed at [place] [address] [place], [place] [address]    Special Requests   Final    BOTTLES DRAWN AEROBIC ONLY Blood Culture results may not be optimal due to an inadequate volume of blood received in culture bottles Performed at [facility] [address], [place], [place] [address]    Culture   Final    NO GROWTH 1 DAY Performed at [facility], [place] [address] [place],  [address]    Report Status PENDING  Incomplete    Radiology Studies: No results found.    Haidar Muse T. [place]  If 7PM-7AM, please contact night-coverage [email or web address] 01/04/2021, 3:46 PM

## 2021-01-04 NOTE — Progress Notes (Signed)
NGT removed at 1315

## 2021-01-05 ENCOUNTER — Inpatient Hospital Stay (HOSPITAL_COMMUNITY): Payer: 59

## 2021-01-05 DIAGNOSIS — N3001 Acute cystitis with hematuria: Secondary | ICD-10-CM | POA: Diagnosis not present

## 2021-01-05 DIAGNOSIS — G893 Neoplasm related pain (acute) (chronic): Secondary | ICD-10-CM | POA: Diagnosis not present

## 2021-01-05 DIAGNOSIS — C541 Malignant neoplasm of endometrium: Secondary | ICD-10-CM | POA: Diagnosis not present

## 2021-01-05 DIAGNOSIS — E876 Hypokalemia: Secondary | ICD-10-CM | POA: Diagnosis not present

## 2021-01-05 LAB — RENAL FUNCTION PANEL
Albumin: 2.3 g/dL — ABNORMAL LOW (ref 3.5–5.0)
Anion gap: 4 — ABNORMAL LOW (ref 5–15)
BUN: 20 mg/dL (ref 8–23)
CO2: 22 mmol/L (ref 22–32)
Calcium: 8.8 mg/dL — ABNORMAL LOW (ref 8.9–10.3)
Chloride: 114 mmol/L — ABNORMAL HIGH (ref 98–111)
Creatinine, Ser: 0.85 mg/dL (ref 0.44–1.00)
GFR, Estimated: >60 mL/min (ref >60)
Glucose, Bld: 92 mg/dL (ref 70–99)
Phosphorus: 2.3 mg/dL — ABNORMAL LOW (ref 2.5–4.6)
Potassium: 3.9 mmol/L (ref 3.5–5.1)
Sodium: 140 mmol/L (ref 135–145)

## 2021-01-05 LAB — CBC
HCT: 32.1 % — ABNORMAL LOW (ref 36.0–46.0)
Hemoglobin: 10.1 g/dL — ABNORMAL LOW (ref 12.0–15.0)
MCH: 28.5 pg (ref 26.0–34.0)
MCHC: 31.5 g/dL (ref 30.0–36.0)
MCV: 90.4 fL (ref 80.0–100.0)
Platelets: 273 10*3/uL (ref 150–400)
RBC: 3.55 MIL/uL — ABNORMAL LOW (ref 3.87–5.11)
RDW: 16.1 % — ABNORMAL HIGH (ref 11.5–15.5)
WBC: 9 10*3/uL (ref 4.0–10.5)
nRBC: 0 % (ref 0.0–0.2)

## 2021-01-05 LAB — MAGNESIUM: Magnesium: 1.7 mg/dL (ref 1.7–2.4)

## 2021-01-05 MED ORDER — PHENOL 1.4 % MT LIQD
1.0000 | OROMUCOSAL | Status: DC | PRN
  Administered 2021-01-05 – 2021-01-06 (×2): 1 via OROMUCOSAL
  Filled 2021-01-05: qty 177

## 2021-01-05 MED ORDER — BISACODYL 10 MG RE SUPP
10.0000 mg | Freq: Every day | RECTAL | Status: DC | PRN
  Administered 2021-01-13 – 2021-01-17 (×3): 10 mg via RECTAL
  Filled 2021-01-05 (×3): qty 1

## 2021-01-05 MED ORDER — KCL-LACTATED RINGERS-D5W 20 MEQ/L IV SOLN
INTRAVENOUS | Status: AC
  Filled 2021-01-05 (×3): qty 1000

## 2021-01-05 MED ORDER — MAGNESIUM SULFATE 2 GM/50ML IV SOLN
2.0000 g | Freq: Once | INTRAVENOUS | Status: AC
  Administered 2021-01-05: 2 g via INTRAVENOUS
  Filled 2021-01-05: qty 50

## 2021-01-05 NOTE — Progress Notes (Signed)
PROGRESS NOTE  Amanda Lin [id number] DOB: 05-25-1955   PCP: Nicolette Bang, MD  Patient is from: Home  DOA: 01/02/2021 LOS: 3  Chief complaints:  Chief Complaint  Patient presents with   Abdominal Pain   Constipation     Brief Narrative / Interim history: 65 year old F with PMH of endometrial cancer, DM-2, HTN and COVID-19 infection in 11/2020 presenting with abdominal pain and emesis for about 3 weeks.  CT abdomen and pelvis showed ileus with possible evolving bowel obstruction, progression of ascites and omental soft tissue nodularity with caking, worsening bilateral hydronephrosis with obstructive uropathy and other findings as noted on CT abdomen and pelvis.  Patient was started on NG tube which was discontinued on 8/31.  Oncology consulted and planning chemotherapy once UTI and bowel obstruction improved.    Urology consulted about bilateral hydronephrosis and recommended supportive care as long as renal function remained stable.  Subjective: Seen and examined earlier this morning.  No major events overnight.  Had small volume emesis after she had a Jello earlier this morning.  Has not a bowel movement in 2 days.  Reports passing small flatus.  Intermittent abdominal pain across her lower abdomen.  She rates her pain 4/10.  Denies chest pain or dyspnea.  No UTI symptoms.  Objective: Vitals:   01/04/21 0518 01/04/21 1846 01/04/21 2036 01/05/21 0454  BP: (!) 148/54 (!) 162/67 (!) 177/65 (!) 170/61  Pulse: 66 62 (!) 58 63  Resp: '16 16 20 18  '$ Temp: 97.7 F (36.5 C) 98.2 F (36.8 C) 98.2 F (36.8 C) 98.1 F (36.7 C)  TempSrc: Oral Oral Oral Axillary  SpO2: 99% 95% 100%   Weight: 80.2 kg       Intake/Output Summary (Last 24 hours) at 01/05/2021 1448 Last data filed at 01/05/2021 0600 Gross per 24 hour  Intake 2172.51 ml  Output --  Net 2172.51 ml   Filed Weights   01/03/21 0600 01/04/21 0518  Weight: 80.7 kg 80.2 kg    Examination:  GENERAL: No  apparent distress.  Nontoxic. HEENT: MMM.  Vision and hearing grossly intact.  NECK: Supple.  No apparent JVD.  RESP:  No IWOB.  Fair aeration bilaterally. CVS:  RRR. Heart sounds normal.  ABD/GI/GU: BS+. Abd soft.  Nondistended.  Mild tenderness across lower abdomen MSK/EXT:  Moves extremities. No apparent deformity. No edema.  SKIN: no apparent skin lesion or wound NEURO: Awake and alert. Oriented appropriately.  No apparent focal neuro deficit. PSYCH: Calm. Normal affect.   Procedures:  Nasogastric tube 8/28-8/31  Microbiology summarized: Blood cultures NGTD. Urine culture with insignificant growth.  Assessment & Plan: Ileus versus early SBO-noted on CT abdomen and pelvis on 8/29.  Repeat abdominal x-ray on 8/30 with normal bowel gas pattern.  NG tube removed on 8/31.  Small-volume emesis this morning -Continue clear liquid diet -Two-view abdominal x-ray -Mobilize in the hall. -Continue IV fluid -PPI and GI cocktail   Acute UTI: Heart dysuria.  UA with pyuria.  Urine culture with insignificant growth. -Completed 3 doses of IV ceftriaxone empirically  Progressive endometrial carcinoma -Appreciate oncology -Planning to start chemo on 9/2  Bilateral hydronephrosis left greater than right -Appreciate urology-supportive care as long as renal function remained stable.   Essential hypertension: Normotensive. -Continue holding home Norvasc, Tenormin, Cozaar  CKD-3A/azotemia Recent Labs    10/20/20 1004 11/02/20 1025 11/17/20 0932 12/22/20 1219 12/27/20 1139 12/30/20 1407 01/02/21 0931 01/03/21 0440 01/04/21 0543 01/05/21 0516  BUN '8 11 8 18 '$ 30*  27* 34* 31* 26* 20  CREATININE 0.86 0.77 0.77 1.14* 1.45* 1.44* 1.30* 1.15* 1.05* 0.85  -Continue monitoring  Hypokalemia/hypomagnesemia: Hypokalemia resolved.  Mg 1.7. -IV magnesium sulfate 2 g x 1  Class I obesity Body mass index is 33.41 kg/m.         DVT prophylaxis:  enoxaparin (LOVENOX) injection 40 mg Start:  01/02/21 1600 SCDs Start: 01/02/21 1225  Code Status: Full code Family Communication: Updated patient's sister at bedside. Level of care: Med-Surg Status is: Inpatient  Remains inpatient appropriate because:Ongoing diagnostic testing needed not appropriate for outpatient work up, IV treatments appropriate due to intensity of illness or inability to take PO, and Inpatient level of care appropriate due to severity of illness  Dispo: The patient is from: Home              Anticipated d/c is to: Home              Patient currently is not medically stable to d/c.   Difficult to place patient No       Consultants:  Oncology Urology   Sch Meds:  Scheduled Meds:  Chlorhexidine Gluconate Cloth  6 each Topical Daily   enoxaparin (LOVENOX) injection  40 mg Subcutaneous Q24H   pantoprazole (PROTONIX) IV  40 mg Intravenous Q24H   Continuous Infusions:  lactated ringers 100 mL/hr at 01/05/21 0344   PRN Meds:.acetaminophen **OR** acetaminophen, alum & mag hydroxide-simeth, bisacodyl, metoprolol tartrate, morphine injection, [DISCONTINUED] ondansetron **OR** ondansetron (ZOFRAN) IV, prochlorperazine  Antimicrobials: Anti-infectives (From admission, onward)    Start     Dose/Rate Route Frequency Ordered Stop   01/02/21 1400  cefTRIAXone (ROCEPHIN) 2 g in sodium chloride 0.9 % 100 mL IVPB        2 g 200 mL/hr over 30 Minutes Intravenous Every 24 hours 01/02/21 1256 01/04/21 1342   01/02/21 1300  cefTRIAXone (ROCEPHIN) 1 g in sodium chloride 0.9 % 100 mL IVPB  Status:  Discontinued        1 g 200 mL/hr over 30 Minutes Intravenous Every 12 hours 01/02/21 1248 01/02/21 1254        I have personally reviewed the following labs and images: CBC: Recent Labs  Lab 12/30/20 1407 01/02/21 0931 01/03/21 0440 01/05/21 0516  WBC 8.2 8.8 5.7 9.0  NEUTROABS 6.4  --   --   --   HGB 10.9* 11.8* 11.0* 10.1*  HCT 33.4* 36.4 34.3* 32.1*  MCV 88.4 90.1 90.5 90.4  PLT 335 359 308 273   BMP  &GFR Recent Labs  Lab 12/30/20 1407 01/02/21 0931 01/02/21 2234 01/03/21 0440 01/04/21 0543 01/05/21 0516  NA 136 132*  --  143 143 140  K 3.1* 3.1*  --  3.0* 4.3 3.9  CL 101 101  --  113* 115* 114*  CO2 25 20*  --  19* 21* 22  GLUCOSE 95 105*  --  88 94 92  BUN 27* 34*  --  31* 26* 20  CREATININE 1.44* 1.30*  --  1.15* 1.05* 0.85  CALCIUM 8.8* 8.5*  --  8.4* 8.4* 8.8*  MG 1.6*  --  1.5*  --  1.9 1.7  PHOS  --   --   --   --   --  2.3*   Estimated Creatinine Clearance: 64.2 mL/min (by C-G formula based on SCr of 0.85 mg/dL). Liver & Pancreas: Recent Labs  Lab 12/30/20 1407 01/02/21 0931 01/05/21 0516  AST 11* 14*  --   ALT 7  13  --   ALKPHOS 84 81  --   BILITOT 0.5 1.0  --   PROT 6.3* 6.4*  --   ALBUMIN 2.9* 2.9* 2.3*   Recent Labs  Lab 01/02/21 0931  LIPASE 24   No results for input(s): AMMONIA in the last 168 hours. Diabetic: No results for input(s): HGBA1C in the last 72 hours. No results for input(s): GLUCAP in the last 168 hours. Cardiac Enzymes: No results for input(s): CKTOTAL, CKMB, CKMBINDEX, TROPONINI in the last 168 hours. No results for input(s): PROBNP in the last 8760 hours. Coagulation Profile: No results for input(s): INR, PROTIME in the last 168 hours. Thyroid Function Tests: No results for input(s): TSH, T4TOTAL, FREET4, T3FREE, THYROIDAB in the last 72 hours. Lipid Profile: No results for input(s): CHOL, HDL, LDLCALC, TRIG, CHOLHDL, LDLDIRECT in the last 72 hours. Anemia Panel: No results for input(s): VITAMINB12, FOLATE, FERRITIN, TIBC, IRON, RETICCTPCT in the last 72 hours. Urine analysis:    Component Value Date/Time   COLORURINE YELLOW 01/02/2021 0931   APPEARANCEUR CLOUDY (A) 01/02/2021 0931   LABSPEC 1.015 01/02/2021 0931   PHURINE 5.0 01/02/2021 0931   GLUCOSEU NEGATIVE 01/02/2021 0931   HGBUR MODERATE (A) 01/02/2021 0931   BILIRUBINUR NEGATIVE 01/02/2021 0931   BILIRUBINUR moderate (A) 02/23/2020 0946   KETONESUR 5 (A)  01/02/2021 0931   PROTEINUR 100 (A) 01/02/2021 0931   UROBILINOGEN 0.2 02/23/2020 0946   NITRITE NEGATIVE 01/02/2021 0931   LEUKOCYTESUR LARGE (A) 01/02/2021 0931   Sepsis Labs: Invalid input(s): PROCALCITONIN, [place]  Microbiology: Recent Results (from the past 240 hour(s))  Urine Culture     Status: Abnormal   Collection Time: 01/02/21 12:35 PM   Specimen: Urine, Clean Catch  Result Value Ref Range Status   Specimen Description   Final    URINE, CLEAN CATCH Performed at [facility], [place] [address], [place], [place] [address]    Special Requests   Final    NONE Performed at [facility], [place] [address], [place], [place] [address]    Culture (A)  Final    <10,000 COLONIES/mL INSIGNIFICANT GROWTH Performed at [facility] [address], [place], [place] [address]    Report Status 01/04/2021 FINAL  Final  Culture, blood (routine x 2)     Status: None (Preliminary result)   Collection Time: 01/02/21  9:02 PM   Specimen: Left Antecubital; Blood  Result Value Ref Range Status   Specimen Description   Final    LEFT ANTECUBITAL Performed at [facility] [address] [place], [place] [address]    Special Requests   Final    BOTTLES DRAWN AEROBIC ONLY Blood Culture results may not be optimal due to an inadequate volume of blood received in culture bottles Performed at [facility] [address], [place],  [address]    Culture   Final    NO GROWTH 2 DAYS Performed at [facility] [address], [place],  [address]    Report Status PENDING  Incomplete    Radiology Studies: No results found.    Gawain Crombie T. [place]  If 7PM-7AM, please contact night-coverage [email or web address] 01/05/2021, 2:48 PM

## 2021-01-05 NOTE — Progress Notes (Signed)
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[facility]) The patient continues to have difficulties tolerating oral intake due to poor appetite, recurrent sensation of heartburn, nausea and changes in bowel habit She has been receiving supportive care with IV fluids in our office and has previously declined admission CT scan 8/29 consistent with disease progression Will consider palliative chemotherapy in the hospital Friday if condition improves I explained to the patient and her daughter that her condition will likely deteriorate without chemotherapy I prefer to wait at least 3 days to allow IV antibiotics for presumed UTI based on her abnormal urinalysis.  Urine culture is pending If she feels better by Friday morning, I will get her started on carboplatin, paclitaxel and Herceptin Carboplatin and paclitaxel was discontinued after treatment in June due to severe pancytopenia and nadired response seen on CT imaging Technically, she did not progress on carboplatin and paclitaxel We reviewed the NCCN guidelines We discussed the role of chemotherapy. The intent is of palliative intent.  We discussed some of the risks, benefits, side-effects of carboplatin & Taxol. Treatment is intravenous, every 3 weeks   Some of the short term side-effects included, though not limited to, including weight loss, life threatening infections, risk of allergic reactions, need for transfusions of blood products, nausea, vomiting, change in bowel habits, loss of hair, admission to hospital for various reasons, and risks of death.   Long term side-effects are also discussed including risks of infertility, permanent damage to nerve function, hearing loss, chronic fatigue, kidney damage with possibility needing hemodialysis, and rare  secondary malignancy including bone marrow disorders.  The patient is aware that the response rates discussed earlier is not guaranteed.  After a long discussion, patient made an informed decision to proceed with the prescribed plan of care.   Patient education material was dispensed.   Cancer associated pain She has multifactorial pain I recommend pain medicine as needed   Nausea with vomiting/ileus She continues to have severe nausea and vomiting Recommend IV fluids and antiemetics Has mild nausea, but no vomiting Okay to keep on clear liquids and would not advance diet at this time  Hydronephrosis Monitor renal function closely Monitor for now  UTI Recommend IV antibiotics for at least 3 days Follow-up on urine culture results    Hypokalemia, inadequate intake Replete per hospitalist  Goals of care Resolution of ileus, nausea vomiting, and UTI I reviewed palliative intent of treatment with the patient and her daughter and they are willing to try She is aware, if despite chemotherapy, that if she did not improve within 2 to 3 weeks time, her condition will be considered terminal at that point in time  Discharge planning Anticipate that she will be in the hospital for at least 5-7 days  [name], NP 01/05/2021 9:11 AM  INTERVAL HISTORY: NG tube was removed yesterday She reports mild abdominal pain which she rates 4/10 Reports mild nausea but no vomiting No BM since Tuesday Positive flatus Was able to take in a small amount of Jell-O yesterday Has been able to ambulate in her room and sit up in the recliner  REVIEW OF SYSTEMS:   Constitutional: Denies fevers, chills  Eyes: Denies blurriness of vision Ears, nose, mouth, throat, and face: Denies mucositis or sore throat Respiratory: Denies cough, dyspnea or wheezes Cardiovascular: Denies palpitation, chest  discomfort, reports increased lower extremity swelling Skin: Denies abnormal skin rashes Lymphatics:  Denies new lymphadenopathy or easy bruising Behavioral/Psych: Mood is stable, no new changes  All other systems were reviewed with the patient and are negative.  I have reviewed the past medical history, past surgical history, social history and family history with the patient and they are unchanged from previous note.  ALLERGIES:  has No Known Allergies.  MEDICATIONS:  Current Facility-Administered Medications  Medication Dose Route Frequency Provider Last Rate Last Admin   acetaminophen (TYLENOL) tablet 650 mg  650 mg Oral Q6H PRN Tacey Ruiz, MD       Or   acetaminophen (TYLENOL) suppository 650 mg  650 mg Rectal Q6H PRN Tacey Ruiz, MD       alum & mag hydroxide-simeth (MAALOX/MYLANTA) 200-200-20 MG/5ML suspension 30 mL  30 mL Oral Q8H PRN Cyndia Skeeters, Taye T, MD   30 mL at 01/05/21 0049   bisacodyl (DULCOLAX) EC tablet 5 mg  5 mg Oral Daily PRN Tacey Ruiz, MD       Chlorhexidine Gluconate Cloth 2 % PADS 6 each  6 each Topical Daily Dessa Phi, DO   6 each at 01/04/21 1034   enoxaparin (LOVENOX) injection 40 mg  40 mg Subcutaneous Q24H Tacey Ruiz, MD   40 mg at 01/04/21 1505   lactated ringers infusion   Intravenous Continuous Wendee Beavers T, MD 100 mL/hr at 01/05/21 0344 New Bag at 01/05/21 0344   magnesium sulfate IVPB 2 g 50 mL  2 g Intravenous Once Wendee Beavers T, MD       metoprolol tartrate (LOPRESSOR) injection 5 mg  5 mg Intravenous Q6H PRN Tacey Ruiz, MD       morphine 2 MG/ML injection 2 mg  2 mg Intravenous Q2H PRN Tacey Ruiz, MD   2 mg at 01/05/21 8527   ondansetron (ZOFRAN) injection 4 mg  4 mg Intravenous Q6H PRN Tacey Ruiz, MD   4 mg at 01/05/21 0457   pantoprazole (PROTONIX) injection 40 mg  40 mg Intravenous Q24H Wendee Beavers T, MD   40 mg at 01/04/21 1452   prochlorperazine (COMPAZINE) injection 10 mg  10 mg Intravenous Q6H PRN Tacey Ruiz, MD   10 mg at 01/04/21 2321   Facility-Administered Medications Ordered in Other Encounters   Medication Dose Route Frequency Provider Last Rate Last Admin   ondansetron (ZOFRAN) 4 MG/2ML injection             SUMMARY OF ONCOLOGIC HISTORY: Oncology History Overview Note  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     02/26/2020 Imaging   1. 3 mm nonobstructing renal stone within the left kidney. 2. Findings suggestive of a small hemorrhagic cyst within the lower pole of the right kidney, with an anterior right lower pole heterogeneous renal soft tissue mass. MRI correlation is recommended, as an underlying neoplastic process cannot be excluded. 3. Colonic diverticulosis. 4. Multiple exophytic uterine fibroids. 5. Evidence of prior cholecystectomy.   03/28/2020 Imaging   2.4 cm enhancing mass in the anterior right lower kidney, suspicious for solid renal neoplasm such as renal cell carcinoma.   Single right renal artery and vein.  No renal vein invasion.   Small retroperitoneal nodes, measuring up to 9 mm short axis, mildly prominent but technically within the upper limits of normal. Attention on follow-up is suggested.  4 x 5 mm nodule in the medial right lower lobe. This is nonspecific and unlikely to reflect a metastasis but warrants attention on follow-up. Consider dedicated CT chest in 3-6 months.   3 mm nonobstructing left lower pole renal calculus. No hydronephrosis.     04/04/2020 Imaging   1. 10 mm endometrial stripe thickness. This is considered abnormal in a postmenopausal female with bleeding. In the setting of post-menopausal bleeding, endometrial sampling is indicated to exclude carcinoma.  2. Multiple uterine fibroids including potential submucosal fibroid in the lower uterine segment.   04/19/2020 Pathology Results   A. ENDOMETRIUM, BIOPSY:  -  High-grade carcinoma  Immunohistochemical and morphometric analysis  performed manually   The tumor cells are POSITIVE for Her2 (3+).    04/19/2020 Procedure   She was seen by Dr. Astrid Drafts on 04/19/2020.  At that visit her cervix was noted to be nodular and firm and abnormal appearing.  Pap taken at that visit was positive for adenocarcinoma, endometrial.  An endometrial Pipelle biopsy was taken at the same visit which revealed high-grade endometrial cancer, suspicious for serous carcinoma.     05/12/2020 Cancer Staging   Staging form: Corpus Uteri - Carcinoma and Carcinosarcoma, AJCC 8th Edition - Clinical stage from 05/12/2020: FIGO Stage IVB (cT3, cN2, cM1) - Signed by [name], MD on 05/31/2020   05/30/2020 PET scan   1. Hypermetabolic lymph nodes identified in the left supraclavicular region/thoracic inlet, mediastinum, hilar regions, right retrocrural space, para-aortic retroperitoneum, and bilateral pelvic sidewall, compatible with metastatic disease. 2. Moderate volume ascites on this noncontrast CT study largely obscures peritoneal and omental disease although hypermetabolic plaque-like uptake in the anterior high abdomen is probably peritoneal with relatively bulky hypermetabolism in the omentum. These findings are also compatible with metastatic disease. 3. Hypermetabolic left upper lobe pulmonary nodule consistent with metastatic disease. Lung primary is considered less likely but not excluded. 4. Scattered areas of focal marrow uptake are suspicious for bony metastases although no underlying abnormality is evident on CT imaging. 5. Diffuse uptake in the uterine anatomy compatible with the patient's known history of endometrial carcinoma. 6. As above, moderate volume ascites is new in the interval. 7. Enhancing lesion lower pole right kidney seen on previous CT of 03/28/2020 is less conspicuous on today's noncontrast CT imaging. Diffuse normal background FDG accumulation could obscure hypermetabolism in this lesion previously characterized as suspicious for  renal cell carcinoma. 8. Bibasilar collapse/consolidation in the lower lobes.   06/01/2020 Procedure   Ultrasound and fluoroscopically guided right internal jugular single lumen power port catheter insertion. Tip in the SVC/RA junction. Catheter ready for use.   06/02/2020 Echocardiogram    1. GLS -18.2%.  2. Left ventricular ejection fraction, by estimation, is 60 to 65%. The left ventricle has normal function. The left ventricle has no regional wall motion abnormalities. There is mild left ventricular hypertrophy. Left ventricular diastolic parameters are consistent with Grade I diastolic dysfunction (impaired relaxation).  3. Right ventricular systolic function is normal. The right ventricular size is normal.  4. The mitral valve is normal in structure. No evidence of mitral valve regurgitation. No evidence of mitral stenosis.  5. The aortic valve is tricuspid. Aortic valve regurgitation is not visualized. No aortic stenosis is present.  6. The inferior vena cava is normal in size with greater than 50% respiratory variability, suggesting right atrial pressure of 3 mmHg.     06/03/2020 -  Chemotherapy    Patient is on Treatment Plan: UTERINE SEROUS CARCINOMA CARBOPLATIN +  PACLITAXEL + TRASTUZUMAB Q21D X 6 CYCLES / TRASTUZUMAB Q21D       07/25/2020 Imaging   1. Interval response to therapy. There has been interval decrease in size of mediastinal, retrocrural, retroperitoneal, and left pelvic sidewall lymph nodes. 2. Decreased volume of ascites. Interval decrease in thickness of omental cake secondary to peritoneal disease. 3. Decrease in size of FDG avid tumor within the umbilicus. 4. Decrease in size of left upper lobe lung nodule. 5. Persistent left-sided hydronephrosis and hydroureter. No obstructing stone identified. 6. Previously noted solid enhancing lesion arising off the anterior cortex of the inferior pole of the right kidney is again noted and remains worrisome for renal cell  carcinoma. 7. Aortic atherosclerosis.     08/08/2020 Echocardiogram    1. Left ventricular ejection fraction, by estimation, is 65 to 70%. The left ventricle has normal function. The left ventricle has no regional wall motion abnormalities. There is mild concentric left ventricular hypertrophy of the basal-septal segment. Left ventricular diastolic parameters are consistent with Grade I diastolic dysfunction (impaired relaxation). The average left ventricular global longitudinal strain is -18.5 %. The global longitudinal strain is normal.  2. Right ventricular systolic function is normal. The right ventricular size is normal.  3. The mitral valve is normal in structure. No evidence of mitral valve regurgitation.  4. The aortic valve is tricuspid. Aortic valve regurgitation is not visualized. No aortic stenosis is present.  5. The inferior vena cava is normal in size with greater than 50% respiratory variability, suggesting right atrial pressure of 3 mmHg.   Comparison(s): A prior study was performed on 06/02/2020. No significant change from prior study. Prior images reviewed side by side. Similar LV deformation and strain and improved loading conditions (improved blood pressure).   11/03/2020 Imaging   1. Overall no significant interval change in exam. 2. Signs of peritoneal carcinomatosis are again noted with perihepatic ascites and omental caking. Not significantly changed in the interval. 3. Slight decrease in size left supraclavicular and mediastinal lymph nodes. Stable borderline retroperitoneal and left pelvic lymph nodes. 4. Small pulmonary nodules are stable. 5. Stable appearance of solid enhancing lesion arising off the anterior cortex of the inferior pole of right kidney compatible with renal cell carcinoma. 6. Stable appearance of mild left hydronephrosis and left hydroureter to just before the urinary bladder. No obstructing stone identified. 7. Aortic atherosclerosis.   11/16/2020  Echocardiogram    1. Left ventricular ejection fraction, by estimation, is 55 to 60%. The left ventricle has normal function. The left ventricle has no regional wall motion abnormalities. There is moderate left ventricular hypertrophy. Left ventricular diastolic parameters are consistent with Grade I diastolic dysfunction (impaired relaxation). The average left ventricular global longitudinal strain is -18.0 %. The global longitudinal strain is normal.  2. Right ventricular systolic function is normal. The right ventricular size is normal. Tricuspid regurgitation signal is inadequate for assessing PA pressure.  3. The mitral valve is normal in structure. No evidence of mitral valve regurgitation. No evidence of mitral stenosis.  4. The aortic valve is tricuspid. Aortic valve regurgitation is not visualized. Mild aortic valve sclerosis is present, with no evidence of aortic valve stenosis.  5. The inferior vena cava is normal in size with greater than 50% respiratory variability, suggesting right atrial pressure of 3 mmHg.     01/02/2021 Imaging   1. Interval progression of ascites with progression of omental soft tissue nodularity now demonstrating areas of confluent caking. Peritoneal nodularity is also progressive in  the interval. 2. Small bowel is mildly distended and fluid-filled up to 3 cm diameter and diffusely fluid-filled. Imaging features are compatible with ileus. Evolving obstruction not entirely excluded. 3. Interval worsening of mild right and moderate left hydronephrosis with decreased perfusion to the left kidney compatible with obstructive uropathy. Both ureters are obstructed at the level of the pelvic sidewall. 4. Subtle enhancing lesion anterior lower pole right kidney, previously characterized as neoplasm on CT without and with contrast dated 03/28/2020. 5. Small lymph nodes in the gastrohepatic and hepatoduodenal ligament, in the retroperitoneal space of the abdomen, and along the  pelvic sidewall bilaterally. 6. Calcified fibroid in the uterus. Endometrium not well visualized. 7. Aortic Atherosclerosis (ICD10-I70.0).   Metastasis to lymph nodes ([place])  05/31/2020 Initial Diagnosis   Metastasis to lymph nodes ([place])   06/03/2020 -  Chemotherapy    Patient is on Treatment Plan: UTERINE SEROUS CARCINOMA CARBOPLATIN + PACLITAXEL + TRASTUZUMAB Q21D X 6 CYCLES / TRASTUZUMAB Q21D       Metastasis to lung ([place])  05/31/2020 Initial Diagnosis   Metastasis to lung ([place])   06/03/2020 -  Chemotherapy    Patient is on Treatment Plan: UTERINE SEROUS CARCINOMA CARBOPLATIN + PACLITAXEL + TRASTUZUMAB Q21D X 6 CYCLES / TRASTUZUMAB Q21D         PHYSICAL EXAMINATION: ECOG PERFORMANCE STATUS: 3 - Symptomatic, >50% confined to bed GENERAL:alert, no distress and comfortable ABD: Positive bowel sounds, abdomen less distended today, soft EXT: Pitting edema bilaterally NEURO: alert & oriented x 3 with fluent speech, no focal motor/sensory deficits  LABORATORY DATA:  I have reviewed the data as listed    Component Value Date/Time   NA 140 01/05/2021 0516   NA 138 02/23/2020 1113   K 3.9 01/05/2021 0516   CL 114 (H) 01/05/2021 0516   CO2 22 01/05/2021 0516   GLUCOSE 92 01/05/2021 0516   BUN 20 01/05/2021 0516   BUN 15 02/23/2020 1113   CREATININE 0.85 01/05/2021 0516   CREATININE 1.44 (H) 12/30/2020 1407   CALCIUM 8.8 (L) 01/05/2021 0516   PROT 6.4 (L) 01/02/2021 0931   PROT 7.8 02/23/2020 1113   ALBUMIN 2.3 (L) 01/05/2021 0516   ALBUMIN 4.4 02/23/2020 1113   AST 14 (L) 01/02/2021 0931   AST 11 (L) 12/30/2020 1407   ALT 13 01/02/2021 0931   ALT 7 12/30/2020 1407   ALKPHOS 81 01/02/2021 0931   BILITOT 1.0 01/02/2021 0931   BILITOT 0.5 12/30/2020 1407   GFRNONAA >60 01/05/2021 0516   GFRNONAA 41 (L) 12/30/2020 1407   GFRAA 91 02/23/2020 1113    No results found for: SPEP, UPEP  Lab Results  Component Value Date   WBC 9.0 01/05/2021   NEUTROABS 6.4 12/30/2020    HGB 10.1 (L) 01/05/2021   HCT 32.1 (L) 01/05/2021   MCV 90.4 01/05/2021   PLT 273 01/05/2021      Chemistry      Component Value Date/Time   NA 140 01/05/2021 0516   NA 138 02/23/2020 1113   K 3.9 01/05/2021 0516   CL 114 (H) 01/05/2021 0516   CO2 22 01/05/2021 0516   BUN 20 01/05/2021 0516   BUN 15 02/23/2020 1113   CREATININE 0.85 01/05/2021 0516   CREATININE 1.44 (H) 12/30/2020 1407      Component Value Date/Time   CALCIUM 8.8 (L) 01/05/2021 0516   ALKPHOS 81 01/02/2021 0931   AST 14 (L) 01/02/2021 0931   AST 11 (L) 12/30/2020 1407   ALT 13  01/02/2021 0931   ALT 7 12/30/2020 1407   BILITOT 1.0 01/02/2021 0931   BILITOT 0.5 12/30/2020 1407

## 2021-01-05 NOTE — Plan of Care (Signed)
  Problem: Clinical Measurements: Goal: Ability to maintain clinical measurements within normal limits will improve Outcome: Progressing Goal: Will remain free from infection Outcome: Progressing   

## 2021-01-05 NOTE — Progress Notes (Signed)
Mobility Specialist - Progress Note     01/05/21 1433  Mobility  Activity Ambulated in hall  Level of Assistance Minimal assist, patient does 75% or more  Assistive Device Front wheel walker  Distance Ambulated (ft) 30 ft  Mobility Ambulated with assistance in hallway  Mobility Response Tolerated well  Mobility performed by Mobility specialist  $Mobility charge 1 Mobility   Upon entry pt was eager to ambulate, but stated feeling nauseous. Pt rested for about 1 minute before attempting to sit EOB with Min A. RW utilized to help pt stand from EOB and ambulate ~30 ft in hallway. Pt requires extra time and encouragement while ambulating due to generalized weakness and c/o of her legs feeling "heavier than usual". At end of session, pt was returned to recliner and left with call bell at side and food tray in front of her.   [place] Specialist [facility] Phone: [phone] 01/05/21, 2:37 PM

## 2021-01-06 ENCOUNTER — Inpatient Hospital Stay: Payer: 59 | Admitting: Hematology and Oncology

## 2021-01-06 DIAGNOSIS — C541 Malignant neoplasm of endometrium: Secondary | ICD-10-CM | POA: Diagnosis not present

## 2021-01-06 DIAGNOSIS — E876 Hypokalemia: Secondary | ICD-10-CM | POA: Diagnosis not present

## 2021-01-06 DIAGNOSIS — E43 Unspecified severe protein-calorie malnutrition: Secondary | ICD-10-CM

## 2021-01-06 DIAGNOSIS — N3001 Acute cystitis with hematuria: Secondary | ICD-10-CM | POA: Diagnosis not present

## 2021-01-06 DIAGNOSIS — G893 Neoplasm related pain (acute) (chronic): Secondary | ICD-10-CM | POA: Diagnosis not present

## 2021-01-06 LAB — CBC WITH DIFFERENTIAL/PLATELET
Abs Immature Granulocytes: 0.2 10*3/uL — ABNORMAL HIGH (ref 0.00–0.07)
Basophils Absolute: 0 10*3/uL (ref 0.0–0.1)
Basophils Relative: 0 %
Eosinophils Absolute: 0 10*3/uL (ref 0.0–0.5)
Eosinophils Relative: 1 %
HCT: 32.8 % — ABNORMAL LOW (ref 36.0–46.0)
Hemoglobin: 10.6 g/dL — ABNORMAL LOW (ref 12.0–15.0)
Immature Granulocytes: 2 %
Lymphocytes Relative: 12 %
Lymphs Abs: 1 10*3/uL (ref 0.7–4.0)
MCH: 29 pg (ref 26.0–34.0)
MCHC: 32.3 g/dL (ref 30.0–36.0)
MCV: 89.9 fL (ref 80.0–100.0)
Monocytes Absolute: 1.1 10*3/uL — ABNORMAL HIGH (ref 0.1–1.0)
Monocytes Relative: 12 %
Neutro Abs: 6.2 10*3/uL (ref 1.7–7.7)
Neutrophils Relative %: 73 %
Platelets: 235 10*3/uL (ref 150–400)
RBC: 3.65 MIL/uL — ABNORMAL LOW (ref 3.87–5.11)
RDW: 16.1 % — ABNORMAL HIGH (ref 11.5–15.5)
WBC: 8.6 10*3/uL (ref 4.0–10.5)
nRBC: 0 % (ref 0.0–0.2)

## 2021-01-06 LAB — COMPREHENSIVE METABOLIC PANEL
ALT: 9 U/L (ref 0–44)
AST: 11 U/L — ABNORMAL LOW (ref 15–41)
Albumin: 2.3 g/dL — ABNORMAL LOW (ref 3.5–5.0)
Alkaline Phosphatase: 71 U/L (ref 38–126)
Anion gap: 7 (ref 5–15)
BUN: 17 mg/dL (ref 8–23)
CO2: 23 mmol/L (ref 22–32)
Calcium: 8.7 mg/dL — ABNORMAL LOW (ref 8.9–10.3)
Chloride: 108 mmol/L (ref 98–111)
Creatinine, Ser: 0.98 mg/dL (ref 0.44–1.00)
GFR, Estimated: >60 mL/min (ref >60)
Glucose, Bld: 123 mg/dL — ABNORMAL HIGH (ref 70–99)
Potassium: 4.1 mmol/L (ref 3.5–5.1)
Sodium: 138 mmol/L (ref 135–145)
Total Bilirubin: 0.4 mg/dL (ref 0.3–1.2)
Total Protein: 5.4 g/dL — ABNORMAL LOW (ref 6.5–8.1)

## 2021-01-06 LAB — MAGNESIUM: Magnesium: 1.9 mg/dL (ref 1.7–2.4)

## 2021-01-06 LAB — GLUCOSE, CAPILLARY
Glucose-Capillary: 135 mg/dL — ABNORMAL HIGH (ref 70–99)
Glucose-Capillary: 129 mg/dL — ABNORMAL HIGH (ref 70–99)

## 2021-01-06 LAB — PHOSPHORUS: Phosphorus: 2.5 mg/dL (ref 2.5–4.6)

## 2021-01-06 MED ORDER — PALONOSETRON HCL INJECTION 0.25 MG/5ML
0.2500 mg | Freq: Once | INTRAVENOUS | Status: AC
  Administered 2021-01-06: 0.25 mg via INTRAVENOUS
  Filled 2021-01-06: qty 5

## 2021-01-06 MED ORDER — PACLITAXEL CHEMO INJECTION 100 MG/16.7ML
87.5000 mg/m2 | Freq: Once | INTRAVENOUS | Status: AC
  Administered 2021-01-06: 162 mg via INTRAVENOUS
  Filled 2021-01-06: qty 27

## 2021-01-06 MED ORDER — FAMOTIDINE IN NACL 20-0.9 MG/50ML-% IV SOLN
20.0000 mg | Freq: Once | INTRAVENOUS | Status: AC
  Administered 2021-01-06: 20 mg via INTRAVENOUS
  Filled 2021-01-06: qty 50

## 2021-01-06 MED ORDER — SODIUM CHLORIDE 0.9 % IV SOLN
396.8000 mg | Freq: Once | INTRAVENOUS | Status: AC
  Administered 2021-01-06: 400 mg via INTRAVENOUS
  Filled 2021-01-06: qty 40

## 2021-01-06 MED ORDER — INSULIN ASPART 100 UNIT/ML IJ SOLN: 0.0000 [IU] | Freq: Three times a day (TID) | INTRAMUSCULAR | Status: DC

## 2021-01-06 MED ORDER — SODIUM CHLORIDE 0.9 % IV SOLN
150.0000 mg | Freq: Once | INTRAVENOUS | Status: AC
  Administered 2021-01-06: 150 mg via INTRAVENOUS
  Filled 2021-01-06: qty 5

## 2021-01-06 MED ORDER — INSULIN ASPART 100 UNIT/ML IJ SOLN
0.0000 [IU] | Freq: Four times a day (QID) | INTRAMUSCULAR | Status: DC
  Administered 2021-01-07: 2 [IU] via SUBCUTANEOUS
  Administered 2021-01-07: 1 [IU] via SUBCUTANEOUS
  Administered 2021-01-07 – 2021-01-08 (×2): 2 [IU] via SUBCUTANEOUS
  Administered 2021-01-08: 1 [IU] via SUBCUTANEOUS
  Administered 2021-01-08: 2 [IU] via SUBCUTANEOUS
  Administered 2021-01-09 (×3): 1 [IU] via SUBCUTANEOUS
  Administered 2021-01-09 – 2021-01-10 (×2): 2 [IU] via SUBCUTANEOUS
  Administered 2021-01-10: 1 [IU] via SUBCUTANEOUS
  Administered 2021-01-10: 2 [IU] via SUBCUTANEOUS
  Administered 2021-01-10 – 2021-01-12 (×6): 1 [IU] via SUBCUTANEOUS
  Administered 2021-01-13: 2 [IU] via SUBCUTANEOUS
  Administered 2021-01-14: 1 [IU] via SUBCUTANEOUS
  Administered 2021-01-14: 2 [IU] via SUBCUTANEOUS
  Administered 2021-01-15 – 2021-01-16 (×5): 1 [IU] via SUBCUTANEOUS
  Administered 2021-01-16: 7 [IU] via SUBCUTANEOUS
  Administered 2021-01-16: 3 [IU] via SUBCUTANEOUS
  Administered 2021-01-17: 5 [IU] via SUBCUTANEOUS
  Administered 2021-01-17: 3 [IU] via SUBCUTANEOUS

## 2021-01-06 MED ORDER — SODIUM PHOSPHATES 45 MMOLE/15ML IV SOLN
10.0000 mmol | Freq: Once | INTRAVENOUS | Status: AC
  Administered 2021-01-06: 10 mmol via INTRAVENOUS
  Filled 2021-01-06: qty 3.33

## 2021-01-06 MED ORDER — TRASTUZUMAB-ANNS CHEMO 150 MG IV SOLR
6.0000 mg/kg | Freq: Once | INTRAVENOUS | Status: AC
  Administered 2021-01-06: 483 mg via INTRAVENOUS
  Filled 2021-01-06: qty 20
  Filled 2021-01-06: qty 23

## 2021-01-06 MED ORDER — SODIUM CHLORIDE 0.9 % IV SOLN
87.5000 mg/m2 | Freq: Once | INTRAVENOUS | Status: DC
  Filled 2021-01-06 (×2): qty 27

## 2021-01-06 MED ORDER — ENALAPRILAT 1.25 MG/ML IV SOLN
1.2500 mg | Freq: Once | INTRAVENOUS | Status: AC
  Administered 2021-01-06: 1.25 mg via INTRAVENOUS
  Filled 2021-01-06: qty 1

## 2021-01-06 MED ORDER — KCL-LACTATED RINGERS-D5W 20 MEQ/L IV SOLN
INTRAVENOUS | Status: AC
  Filled 2021-01-06 (×3): qty 1000

## 2021-01-06 MED ORDER — SODIUM CHLORIDE 0.9 % IV SOLN: Freq: Once | INTRAVENOUS | Status: AC

## 2021-01-06 MED ORDER — TRAVASOL 10 % IV SOLN
INTRAVENOUS | Status: AC
  Filled 2021-01-06: qty 480

## 2021-01-06 MED ORDER — ACETAMINOPHEN 325 MG PO TABS: 650.0000 mg | ORAL_TABLET | Freq: Once | ORAL | Status: DC

## 2021-01-06 MED ORDER — SODIUM CHLORIDE 0.9 % IV SOLN
10.0000 mg | Freq: Once | INTRAVENOUS | Status: AC
  Administered 2021-01-06: 10 mg via INTRAVENOUS
  Filled 2021-01-06: qty 1

## 2021-01-06 MED ORDER — DIPHENHYDRAMINE HCL 50 MG/ML IJ SOLN
25.0000 mg | Freq: Once | INTRAMUSCULAR | Status: AC
  Administered 2021-01-06: 25 mg via INTRAVENOUS
  Filled 2021-01-06: qty 1

## 2021-01-06 MED ORDER — MAGNESIUM SULFATE IN D5W 1-5 GM/100ML-% IV SOLN
1.0000 g | Freq: Once | INTRAVENOUS | Status: AC
  Administered 2021-01-06: 1 g via INTRAVENOUS
  Filled 2021-01-06: qty 100

## 2021-01-06 NOTE — Progress Notes (Signed)
OK to infuse Taxol over 1 - 1.2 hrs per Dr. Alvy Bimler (dose basis = 87.5 mg/m2)  Kennith Center, Pharm.D., CPP 01/06/2021'@2'$ :34 PM

## 2021-01-06 NOTE — Progress Notes (Signed)
No reload of trastuzumab today per Dr. Alvy Bimler.  Kennith Center, Pharm.D., CPP 01/06/2021'@11'$ :23 AM

## 2021-01-06 NOTE — Progress Notes (Signed)
PROGRESS NOTE  Amanda Lin [id number] DOB: August 04, 1955   PCP: Nicolette Bang, MD  Patient is from: Home  DOA: 01/02/2021 LOS: 4  Chief complaints:  Chief Complaint  Patient presents with   Abdominal Pain   Constipation     Brief Narrative / Interim history: 65 year old F with PMH of endometrial cancer, DM-2, HTN and COVID-19 infection in 11/2020 presenting with abdominal pain and emesis for about 3 weeks.  CT abdomen and pelvis showed ileus with possible evolving bowel obstruction, progression of ascites and omental soft tissue nodularity with caking, worsening bilateral hydronephrosis with obstructive uropathy and other findings as noted on CT abdomen and pelvis.  Patient was started on NG tube which was discontinued on 8/31.  She had emesis on 9/1.  KUB with increased small bowel distention with multiple fluid levels and paucity of distal gas concerning for bowel obstruction.  NG tube replaced on 9/1.  Oncology consulted and planning chemotherapy once UTI and bowel obstruction improved.    Urology consulted about bilateral hydronephrosis and recommended supportive care as long as renal function remained stable.  Subjective: Seen and examined earlier this morning.  No major events overnight of this morning.  No major complaints other than sore throat from NG tube.  She denies nausea, vomiting or abdominal pain.  Denies chest pain or dyspnea.  No BM or flatus yet.  Objective: Vitals:   01/05/21 2140 01/06/21 0034 01/06/21 0459 01/06/21 0903  BP: (!) 181/80 (!) 168/81 (!) 181/76   Pulse: 73 65 71   Resp: 16  16   Temp: 97.9 F (36.6 C)  97.6 F (36.4 C)   TempSrc: Oral  Oral   SpO2: 98%  100%   Weight:   81.1 kg 82 kg    Intake/Output Summary (Last 24 hours) at 01/06/2021 1824 Last data filed at 01/06/2021 1721 Gross per 24 hour  Intake 3152.87 ml  Output 1250 ml  Net 1902.87 ml   Filed Weights   01/04/21 0518 01/06/21 0459 01/06/21 0903  Weight: 80.2 kg  81.1 kg 82 kg    Examination:  GENERAL: No apparent distress.  Nontoxic. HEENT: MMM.  Vision and hearing grossly intact.  NGT to wall suction.  Bilious output in canister NECK: Supple.  No apparent JVD.  RESP:  No IWOB.  Fair aeration bilaterally. CVS:  RRR. Heart sounds normal.  ABD/GI/GU: BS+. Abd soft, NTND but firm.  MSK/EXT:  Moves extremities. No apparent deformity. No edema.  SKIN: no apparent skin lesion or wound NEURO: Awake and alert. Oriented appropriately.  No apparent focal neuro deficit. PSYCH: Calm. Normal affect.   Procedures:  Nasogastric tube 8/28-8/31  Microbiology summarized: Blood cultures NGTD. Urine culture with insignificant growth.  Assessment & Plan: Possible small bowel obstruction-noted on CT abdomen and pelvis on 8/29.  Repeat abdominal x-ray on 8/30 with normal bowel gas pattern.  NG tube removed on 8/31.  Then she had emesis on 9/1.  Repeat KUB with increased small bowel distention, multiple fluid levels and paucity of distal gas concerning for obstruction. -NGT replaced on 9/1. -Remains n.p.o. except sips and ice chips. -Mobilize in the hall every 4 hours while awake. -Continue IV fluid -IV PPI -Started TPN on 9/2.   Acute UTI: Heart dysuria.  UA with pyuria.  Urine culture with insignificant growth. -Completed 3 doses of IV ceftriaxone empirically  Progressive endometrial carcinoma -Appreciate oncology-started palliative chemotherapy on 9/2.  Bilateral hydronephrosis left greater than right -Appreciate urology-supportive care as long as renal  function remained stable.   Essential hypertension: Normotensive. -Continue holding home Norvasc, Tenormin, Cozaar  CKD-3A/azotemia Recent Labs    11/02/20 1025 11/17/20 0932 12/22/20 1219 12/27/20 1139 12/30/20 1407 01/02/21 0931 01/03/21 0440 01/04/21 0543 01/05/21 0516 01/06/21 0458  BUN '11 8 18 '$ 30* 27* 34* 31* 26* 20 17  CREATININE 0.77 0.77 1.14* 1.45* 1.44* 1.30* 1.15* 1.05* 0.85  0.98  -Continue monitoring  Hypokalemia/hypomagnesemia: Resolved.  Goal of care: Patient with grim prognosis with advanced endometrial carcinoma.  Currently on palliative chemotherapy -Palliative care consulted  Class I obesity/severe protein calorie malnutrition Body mass index is 34.16 kg/m. Nutrition Problem: Inadequate oral intake Etiology: inability to eat Signs/Symptoms: NPO status Interventions: TPN   DVT prophylaxis:  enoxaparin (LOVENOX) injection 40 mg Start: 01/02/21 1600 SCDs Start: 01/02/21 1225  Code Status: Full code Family Communication: Updated patient's sister at bedside Level of care: Med-Surg Status is: Inpatient  Remains inpatient appropriate because:Ongoing diagnostic testing needed not appropriate for outpatient work up, IV treatments appropriate due to intensity of illness or inability to take PO, and Inpatient level of care appropriate due to severity of illness  Dispo: The patient is from: Home              Anticipated d/c is to: Home              Patient currently is not medically stable to d/c.   Difficult to place patient No       Consultants:  Oncology Urology   Sch Meds:  Scheduled Meds:  acetaminophen  650 mg Oral Once   Chlorhexidine Gluconate Cloth  6 each Topical Daily   enoxaparin (LOVENOX) injection  40 mg Subcutaneous Q24H   insulin aspart  0-9 Units Subcutaneous Q6H   pantoprazole (PROTONIX) IV  40 mg Intravenous Q24H   Continuous Infusions:  dextrose 5% lactated ringers with KCl 20 mEq/L     sodium phosphate  Dextrose 5% IVPB 10 mmol (01/06/21 1821)   TPN ADULT (ION) 40 mL/hr at 01/06/21 1721   PRN Meds:.acetaminophen **OR** acetaminophen, alum & mag hydroxide-simeth, bisacodyl, metoprolol tartrate, morphine injection, [DISCONTINUED] ondansetron **OR** ondansetron (ZOFRAN) IV, phenol, prochlorperazine  Antimicrobials: Anti-infectives (From admission, onward)    Start     Dose/Rate Route Frequency Ordered Stop    01/02/21 1400  cefTRIAXone (ROCEPHIN) 2 g in sodium chloride 0.9 % 100 mL IVPB        2 g 200 mL/hr over 30 Minutes Intravenous Every 24 hours 01/02/21 1256 01/04/21 1342   01/02/21 1300  cefTRIAXone (ROCEPHIN) 1 g in sodium chloride 0.9 % 100 mL IVPB  Status:  Discontinued        1 g 200 mL/hr over 30 Minutes Intravenous Every 12 hours 01/02/21 1248 01/02/21 1254        I have personally reviewed the following labs and images: CBC: Recent Labs  Lab 01/02/21 0931 01/03/21 0440 01/05/21 0516 01/06/21 0458  WBC 8.8 5.7 9.0 8.6  NEUTROABS  --   --   --  6.2  HGB 11.8* 11.0* 10.1* 10.6*  HCT 36.4 34.3* 32.1* 32.8*  MCV 90.1 90.5 90.4 89.9  PLT 359 308 273 235   BMP &GFR Recent Labs  Lab 01/02/21 0931 01/02/21 2234 01/03/21 0440 01/04/21 0543 01/05/21 0516 01/06/21 0458  NA 132*  --  143 143 140 138  K 3.1*  --  3.0* 4.3 3.9 4.1  CL 101  --  113* 115* 114* 108  CO2 20*  --  19* 21*  22 23  GLUCOSE 105*  --  88 94 92 123*  BUN 34*  --  31* 26* 20 17  CREATININE 1.30*  --  1.15* 1.05* 0.85 0.98  CALCIUM 8.5*  --  8.4* 8.4* 8.8* 8.7*  MG  --  1.5*  --  1.9 1.7 1.9  PHOS  --   --   --   --  2.3* 2.5   Estimated Creatinine Clearance: 56.3 mL/min (by C-G formula based on SCr of 0.98 mg/dL). Liver & Pancreas: Recent Labs  Lab 01/02/21 0931 01/05/21 0516 01/06/21 0458  AST 14*  --  11*  ALT 13  --  9  ALKPHOS 81  --  71  BILITOT 1.0  --  0.4  PROT 6.4*  --  5.4*  ALBUMIN 2.9* 2.3* 2.3*   Recent Labs  Lab 01/02/21 0931  LIPASE 24   No results for input(s): AMMONIA in the last 168 hours. Diabetic: No results for input(s): HGBA1C in the last 72 hours. Recent Labs  Lab 01/06/21 1234 01/06/21 1738  GLUCAP 135* 129*   Cardiac Enzymes: No results for input(s): CKTOTAL, CKMB, CKMBINDEX, TROPONINI in the last 168 hours. No results for input(s): PROBNP in the last 8760 hours. Coagulation Profile: No results for input(s): INR, PROTIME in the last 168  hours. Thyroid Function Tests: No results for input(s): TSH, T4TOTAL, FREET4, T3FREE, THYROIDAB in the last 72 hours. Lipid Profile: No results for input(s): CHOL, HDL, LDLCALC, TRIG, CHOLHDL, LDLDIRECT in the last 72 hours. Anemia Panel: No results for input(s): VITAMINB12, FOLATE, FERRITIN, TIBC, IRON, RETICCTPCT in the last 72 hours. Urine analysis:    Component Value Date/Time   COLORURINE YELLOW 01/02/2021 0931   APPEARANCEUR CLOUDY (A) 01/02/2021 0931   LABSPEC 1.015 01/02/2021 0931   PHURINE 5.0 01/02/2021 0931   GLUCOSEU NEGATIVE 01/02/2021 0931   HGBUR MODERATE (A) 01/02/2021 0931   BILIRUBINUR NEGATIVE 01/02/2021 0931   BILIRUBINUR moderate (A) 02/23/2020 0946   KETONESUR 5 (A) 01/02/2021 0931   PROTEINUR 100 (A) 01/02/2021 0931   UROBILINOGEN 0.2 02/23/2020 0946   NITRITE NEGATIVE 01/02/2021 0931   LEUKOCYTESUR LARGE (A) 01/02/2021 0931   Sepsis Labs: Invalid input(s): PROCALCITONIN, [place]  Microbiology: Recent Results (from the past 240 hour(s))  Urine Culture     Status: Abnormal   Collection Time: 01/02/21 12:35 PM   Specimen: Urine, Clean Catch  Result Value Ref Range Status   Specimen Description   Final    URINE, CLEAN CATCH Performed at [facility], [place] [address] [place], [place] [phone]    Special Requests   Final    NONE Performed at [facility], [place] [address], [place], [place] [address]    Culture (A)  Final    <10,000 COLONIES/mL INSIGNIFICANT GROWTH Performed at [place] [address], [place], [place] [address]    Report Status 01/04/2021 FINAL  Final  Culture, blood (routine x 2)     Status: None (Preliminary result)   Collection Time: 01/02/21  9:02 PM   Specimen: Left Antecubital; Blood  Result Value Ref Range Status   Specimen Description   Final    LEFT ANTECUBITAL Performed at [place] [address], [place], [place] [address]    Special Requests    Final    BOTTLES DRAWN AEROBIC ONLY Blood Culture results may not be optimal due to an inadequate volume of blood received in culture bottles Performed at [facility],  [place] [address] [place], [place] [address]    Culture   Final    NO GROWTH 3 DAYS Performed at [facility], [place] [address], [place], [place] [address]    Report Status PENDING  Incomplete    Radiology Studies: No results found.    Brezlyn Manrique T. [place]  If 7PM-7AM, please contact night-coverage [email or web address] 01/06/2021, 6:24 PM

## 2021-01-06 NOTE — Progress Notes (Signed)
PHARMACY - TOTAL PARENTERAL NUTRITION CONSULT NOTE   Indication: Prolonged ileus  Patient Measurements: Weight: 81.1 kg (178 lb 12.7 oz)   Body mass index is 33.78 kg/m. Usual Weight:   Assessment: Patient is a 65 y.o F with endometrial cancer presented to the ED on 01/02/21 with c/o abdominal pain and n/v.  Abd CT on 8/29 showed findings consistent with ileus and disease progress. Plan is to start chemotherapy with this admission and to start TPN on 9/2 for ileus.  Glucose / Insulin: not on insulin - cbgs (goal <150): 123 from CMET; CL and CO2 wnl Electrolytes: Na 138, K 4.1, corrCa 10, Phos 2.5, Mag 1.9 - goal K >4, mag >2 for ileus Renal: scr 0.98 (crcl~56) Hepatic: AST low, TBili wnl Intake / Output; MIVF:  - D5 LR with 20 meq KCL @ 125 ml/hr - I/O: +718 ml GI Imaging: - 8/29 abd CT: Interval progression of ascites with progression of omental soft tissue nodularity now demonstrating areas of confluent caking. Small bowel is mildly distended and fluid-filled up to 3 cm diameter and diffusely fluid-filled. Imaging features are compatible with ileus. Interval worsening of mild right and moderate left hydronephrosis with decreased perfusion to the left kidney compatible with obstructive uropathy GI Surgeries / Procedures:  - has NG tube  Central access: port-a-cath TPN start date: 01/06/21  Nutritional Goals: Goal TPN rate is  100 mL/hr (provides 120 g of protein and 2352 kcals per day)  RD Assessment (01/06/21): Kcal:  [id number] kcal Protein:  115-130 gm Fluid:   > 2.2 L/day  Current Nutrition:  Clear liquids  Plan:   Now: - magnesium sulfate 1gm IV x1 - sodium phosphate 10 mmol IV x1  - Start TPN at 40 mL/hr at 1800 - Electrolytes in TPN: Na 59mq/L, K 522m/L, Ca 51m69mL, Mg 51mE30m, and Phos 151mm36m. Cl:Ac 1:1 - Add standard MVI and trace elements to TPN - Initiate Sensitive q6h SSI and adjust as needed  - Reduce MIVF to 80 mL/hr at 1800 - Monitor TPN labs on  Mon/Thurs - cmet, phos, mag on 9/3  Jayle Solarz P 01/06/2021,8:27 AM

## 2021-01-06 NOTE — Progress Notes (Signed)
Initial Nutrition Assessment  DOCUMENTATION CODES:   Obesity unspecified  INTERVENTION:  - TPN initiation and advancement per Pharmacist. - Monitor magnesium, potassium, and phosphorus daily for at least 3 days, MD to replete as needed, as pt is at risk for refeeding syndrome given >/= 3 weeks without adequate nutrition.   NUTRITION DIAGNOSIS:   Inadequate oral intake related to inability to eat as evidenced by NPO status.  GOAL:   Patient will meet greater than or equal to 90% of their needs  MONITOR:   Labs, Weight trends, I & O's, Other (Comment) (TPN regimen)  REASON FOR ASSESSMENT:   Consult New TPN/TNA  ASSESSMENT:   65 y.o. female with medical history of endometrial cancer (diagnosed January 2022), DM, HTN, and COVID infection early July 2022. She presented to the ED on 8/29 due to abdominal pain and vomiting x3 weeks and combination of constipation and diarrhea.  Able to communicate with Pharmacist before and after visit to patient's room.  Patient was on CLD from 1225 on 8/30-1703 on 9/1. She did not like the broth but was consuming some juice, [place] ices, and water while on this diet.  For several weeks PTA she was living with her daughter. She was only able to consume items such as melon and liquids for the past 2-3 weeks, but nearly every time she consumed anything, to include water, she would vomit 5-15 minutes after intake. Sometimes she would feel nauseated before vomiting. She did sometimes feel like items were getting caught in mid-chest and this would lead to several episodes of vomiting.   She has NGT in R nare to LIS with ~500 ml output. Tube placed yesterday evening and placement is noted to be gastric.  She denies any abdominal pain or nausea at the time of RD visit.  Patient has port in place in R upper chest. Her daughter with whom she lives is at bedside. Discussed plan for TPN with both of them and they are very appreciative. All questions  answered.  Weight today is 181 lb and weight on 11/17/20 was 191 lb. This indicates 10 lb weight loss (5% body weight) in the past 1.5 months; not significant for time frame but could be inaccurate given BLE edema.    Labs reviewed; Ca: 8.7 mg/dl. Medications reviewed; 20 mg IV pepcid x1 dose 9/2, sliding scale novolog, 1 g IV Mg sulfate x1 run 9/2, 40 mg IV protonix/day, 10 mmol IV NaPhos x1 run 9/2. IVF; D5-LR-20 mEq KCl @ 125 ml/hr (510 kcal/24 hrs) to decrease to 80 ml/hr (326 kcal/24 hrs) at 1800.    NUTRITION - FOCUSED PHYSICAL EXAM:  Completed; no muscle or fat depletions noted at this time; moderate edema to BLE.  Diet Order:   Diet Order             Diet NPO time specified Except for: Sips with Meds  Diet effective now                   EDUCATION NEEDS:   Education needs have been addressed  Skin:  Skin Assessment: Reviewed RN Assessment  Last BM:  8/31  Height:   Ht Readings from Last 1 Encounters:  12/27/20 '5\' 1"'$  (1.549 m)    Weight:   Wt Readings from Last 1 Encounters:  01/06/21 82 kg     Estimated Nutritional Needs:  Kcal:  2285-2500 kcal Protein:  115-130 grams Fluid:  >/= 2.2 L/day      Jarome Matin, MS, RD,  LDN, CNSC Inpatient Clinical Dietitian RD pager # available in [place]  After hours/weekend pager # available in [facility]

## 2021-01-06 NOTE — Progress Notes (Signed)
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 Treatment is intravenous, every 3 week She is ready to proceed Continue supportive care I do not anticipate clinical benefit for 5 to 7 days  Cancer associated pain She has multifactorial pain I recommend pain medicine as needed   Nausea with vomiting/ileus secondary to abdominal carcinomatosis She continues to have severe nausea and vomiting Recommend IV fluids and antiemetics Do not remove NG tube until next week  Severe protein calorie malnutrition Will consult pharmacist to initiate TPN  Hydronephrosis Monitor renal function closely I spoke with urologist We can technically monitor this closely for now   UTI, adequately treated    Goals of care Resolution of ileus, nausea vomiting, and UTI I reviewed palliative intent of treatment with the patient and her daughter and they are willing to try She is aware, if despite chemotherapy, that if she did not improve within 2 to 3 weeks time, her condition will be considered terminal at that point in time   Discharge planning She will likely be here for 5 to 7 days I will return to check on her next week, Tuesday If questions arise, please do not hesitate to consult oncology team over the weekend  All questions were answered. The patient knows to call the clinic with any problems, questions or concerns.   The total time spent in the appointment was 40 minutes encounter with patients including review of chart and various tests results, discussions about plan of care and coordination of care  plan  [name], MD 01/06/2021 7:44 AM  Subjective:  Events over the last 3 days were reviewed She had replacement of NG tube due to severe recurrent nausea and vomiting Pain is ok, afebrile  Objective:  Vitals:   01/06/21 0034 01/06/21 0459  BP: (!) 168/81 (!) 181/76  Pulse: 65 71  Resp:  16  Temp:  97.6 F (36.4 C)  SpO2:  100%     Intake/Output Summary (Last 24 hours) at 01/06/2021 0744 Last data filed at 01/06/2021 0500 Gross per 24 hour  Intake 1118.75 ml  Output 400 ml  Net 718.75 ml    GENERAL:alert, no distress and comfortable, NG tube in situ SKIN: skin color, texture, turgor are normal, no rashes or significant lesions EYES: normal, Conjunctiva are pink and non-injected, sclera clear OROPHARYNX:no exudate, no erythema and lips, buccal mucosa, and tongue normal  NECK: supple, thyroid normal size, non-tender, without nodularity LYMPH:  no palpable lymphadenopathy in the cervical, axillary or inguinal LUNGS: clear to auscultation and percussion with normal breathing effort HEART: regular rate & rhythm and no murmurs and no lower extremity edema ABDOMEN:abdomen soft, non-tender, reduced bowel sounds Musculoskeletal:no cyanosis of digits and no clubbing  NEURO: alert & oriented x 3 with fluent speech, no focal motor/sensory deficits   Labs:  Recent Labs    02/23/20 1113 02/26/20 1007 12/30/20 1407 01/02/21 0931 01/03/21 0440 01/04/21 0543 01/05/21 0516 01/06/21 0458  NA 138   < > 136 132*   < > 143 140 138  K 4.2   < > 3.1* 3.1*   < > 4.3 3.9 4.1  CL 102   < > 101 101   < > 115* 114* 108  CO2 19*   < > 25 20*   < > 21* 22 23  GLUCOSE 138*   < > 95 105*   < > 94 92 123*  BUN 15   < > 27* 34*   < > 26* 20 17  CREATININE 0.80   < > 1.44* 1.30*   < > 1.05* 0.85 0.98  CALCIUM 9.6   < > 8.8* 8.5*   < > 8.4* 8.8* 8.7*  GFRNONAA 79   < > 41* 46*   < > 59* >60 >60  GFRAA 91  --   --   --   --   --   --   --   PROT 7.8   < > 6.3* 6.4*  --   --   --  5.4*  ALBUMIN  4.4   < > 2.9* 2.9*  --   --  2.3* 2.3*  AST 14   < > 11* 14*  --   --   --  11*  ALT 19   < > 7 13  --   --   --  9  ALKPHOS 115   < > 84 81  --   --   --  71  BILITOT 0.6   < > 0.5 1.0  --   --   --  0.4   < > = values in this interval not displayed.    Studies:  DG Abd 1 View  Result Date: 01/03/2021 CLINICAL DATA:  Ileus EXAM: ABDOMEN - 1 VIEW COMPARISON:  Portable exam 0848 hours compared to 01/02/2021 FINDINGS: Tip of nasogastric tube projects over stomach. Excreted contrast material within urinary bladder with faint contrast bilaterally in dilated renal collecting systems. Single nonspecific air-filled nondistended small bowel loops in mid abdomen. Bowel gas pattern otherwise normal. Osseous structures unremarkable. Surgical clips RIGHT upper quadrant likely reflect prior cholecystectomy. IMPRESSION: BILATERAL hydronephrosis. Normal bowel gas pattern. Electronically Signed   By: Lavonia Dana M.D.   On: 01/03/2021 10:44   CT ABDOMEN PELVIS W CONTRAST  Result Date: 01/02/2021 CLINICAL DATA:  Endometrial cancer.  Bowel obstruction suspected. EXAM: CT ABDOMEN AND PELVIS WITH CONTRAST TECHNIQUE: Multidetector CT imaging of the abdomen and pelvis was performed using the standard protocol following bolus administration of intravenous contrast. CONTRAST:  35m OMNIPAQUE IOHEXOL 350 MG/ML SOLN COMPARISON:  11/02/2020 FINDINGS: Lower chest: Basilar atelectasis. Hepatobiliary: No suspicious focal abnormality within the liver parenchyma. Gallbladder is surgically absent. No intrahepatic or extrahepatic biliary dilation. Pancreas: No focal mass lesion. No dilatation of the main duct. No intraparenchymal cyst. No peripancreatic edema. Spleen: No splenomegaly. No focal mass lesion. Adrenals/Urinary Tract: No adrenal nodule or mass. Mild right and moderate left hydronephrosis evident with decreased perfusion to the left kidney. 1.3 cm lesion lower pole right kidney, previously characterized as hemorrhagic cyst.  More subtle enhancing lesion identified anterior lower pole right kidney, previously characterized as suspicious for neoplasm on CT without and with contrast dated 03/28/2020. Both ureters are dilated down to the level of the pelvic sidewall. Bladder is unremarkable. Stomach/Bowel: Stomach is unremarkable. No gastric wall thickening. No evidence of outlet obstruction. Duodenum is normally positioned as is the ligament of Treitz. No small bowel wall thickening. Small bowel is mildly distended up to 3 cm diameter and diffusely fluid-filled. The terminal ileum is normal. The appendix is normal. No gross colonic mass. No colonic wall thickening. Vascular/Lymphatic:  There is mild atherosclerotic calcification of the abdominal aorta without aneurysm. Small lymph nodes are seen in the gastrohepatic and hepatoduodenal ligament. Small lymph nodes evident in the retroperitoneal space of the abdomen along the pelvic sidewall bilaterally. Reproductive: Calcified fibroid noted in the uterus. Endometrium not well visualized. There is no adnexal mass. Other: Interval progression ascites with progression of omental soft tissue nodularity now demonstrating areas of confluent "caking" (see midline image 46/2). Enhancing peritoneal nodules are seen adjacent to the inferior liver (35/2) and in the right pelvis (63/2). There are enhancing mesenteric nodules (see central mesentery on 52/2) with soft tissue nodularity in the region of the umbilicus measuring 2.3 x 2.2 cm on image 54/2. Moderate volume ascites seen around the liver and spleen and along the stomach with moderate volume ascites in the pelvis is well. Musculoskeletal: No worrisome lytic or sclerotic osseous abnormality. IMPRESSION: 1. Interval progression of ascites with progression of omental soft tissue nodularity now demonstrating areas of confluent caking. Peritoneal nodularity is also progressive in the interval. 2. Small bowel is mildly distended and fluid-filled up to  3 cm diameter and diffusely fluid-filled. Imaging features are compatible with ileus. Evolving obstruction not entirely excluded. 3. Interval worsening of mild right and moderate left hydronephrosis with decreased perfusion to the left kidney compatible with obstructive uropathy. Both ureters are obstructed at the level of the pelvic sidewall. 4. Subtle enhancing lesion anterior lower pole right kidney, previously characterized as neoplasm on CT without and with contrast dated 03/28/2020. 5. Small lymph nodes in the gastrohepatic and hepatoduodenal ligament, in the retroperitoneal space of the abdomen, and along the pelvic sidewall bilaterally. 6. Calcified fibroid in the uterus. Endometrium not well visualized. 7. Aortic Atherosclerosis (ICD10-I70.0). Electronically Signed   By: Misty Stanley M.D.   On: 01/02/2021 11:56   DG Abd 2 Views  Result Date: 01/05/2021 CLINICAL DATA:  Abdomen pain and emesis EXAM: ABDOMEN - 2 VIEW COMPARISON:  01/03/2021, CT 01/02/2021 FINDINGS: Trace pleural effusion left lung base. No free air beneath the diaphragm. Increased small bowel distension, measuring up to 4.1 cm with multiple fluid levels. Paucity of distal gas. IMPRESSION: 1. Increased small bowel distension with multiple fluid levels and paucity of distal gas, suspect for bowel obstruction Electronically Signed   By: Donavan Foil M.D.   On: 01/05/2021 16:34   DG Abd 2 Views  Result Date: 12/22/2020 CLINICAL DATA:  Right lower quadrant abdominal pain with nausea, vomiting, and constipation. History of endometrial cancer. EXAM: ABDOMEN - 2 VIEW COMPARISON:  None. FINDINGS: The bowel gas pattern is normal. There is no evidence of free air. No radio-opaque calculi or other significant radiographic abnormality is seen. Surgical clips in the right upper quadrant and pelvis. No acute osseous abnormality. IMPRESSION: Negative. Electronically Signed   By: Titus Dubin M.D.   On: 12/22/2020 12:03   DG Abd Portable 1  View  Result Date: 01/02/2021 CLINICAL DATA:  Check gastric catheter placement EXAM: PORTABLE ABDOMEN - 1 VIEW COMPARISON:  None. FINDINGS: Gastric catheter is noted extending into the stomach. The proximal side port lies just beyond the gastroesophageal junction and could be advanced a few cm as necessary. Visualized lungs are hypoinflated but clear. Right chest wall port is noted. IMPRESSION: Gastric catheter in the stomach as described. Electronically Signed   By: Inez Catalina M.D.   On: 01/02/2021 14:01

## 2021-01-06 NOTE — Progress Notes (Signed)
Asked to assist with administering chemotherapy. On arrival to room, patient awake and oriented. Daughter in room. Patient aware of plan of care and agrees to proceed with chemotherapy treatment today. Labs and echo reviewed. Premedicated and chemotherapy administered as documented in [facility]. After carboplatin initiated, report given to primary RN, Selinda Eon. Patient in no distress and tolerating infusion with no adverse events.

## 2021-01-06 NOTE — Progress Notes (Signed)
   01/06/21 1200  Mobility  Activity Ambulated in hall  Level of Assistance Contact guard assist, steadying assist  Assistive Device Front wheel walker  Distance Ambulated (ft) 45 ft  Mobility Ambulated with assistance in room  Mobility Response Tolerated well  Mobility performed by Mobility specialist  $Mobility charge 1 Mobility   RN removed NG tube and purewick prior to ambulation. Pt required min A to get EOB and to stand. Pt ambulated about 63f with RW, tolerated well. She stated her legs felt stiff, otherwise no other complaints. Took 3 standing rest breaks during mobility. Pt left in bed with bed alarm on, call bell at side, and daughter present. RN notified of return from mobility and to reattach NG tube and purewick.   KAkronSpecialist Acute Rehab Services Office: [phone]

## 2021-01-07 ENCOUNTER — Encounter: Payer: Self-pay | Admitting: Hematology and Oncology

## 2021-01-07 DIAGNOSIS — G893 Neoplasm related pain (acute) (chronic): Secondary | ICD-10-CM | POA: Diagnosis not present

## 2021-01-07 DIAGNOSIS — Z7189 Other specified counseling: Secondary | ICD-10-CM | POA: Diagnosis not present

## 2021-01-07 DIAGNOSIS — N3001 Acute cystitis with hematuria: Secondary | ICD-10-CM | POA: Diagnosis not present

## 2021-01-07 DIAGNOSIS — E876 Hypokalemia: Secondary | ICD-10-CM | POA: Diagnosis not present

## 2021-01-07 DIAGNOSIS — R112 Nausea with vomiting, unspecified: Secondary | ICD-10-CM | POA: Diagnosis not present

## 2021-01-07 DIAGNOSIS — C541 Malignant neoplasm of endometrium: Secondary | ICD-10-CM | POA: Diagnosis not present

## 2021-01-07 DIAGNOSIS — Z515 Encounter for palliative care: Secondary | ICD-10-CM | POA: Diagnosis not present

## 2021-01-07 LAB — COMPREHENSIVE METABOLIC PANEL
ALT: 10 U/L (ref 0–44)
AST: 15 U/L (ref 15–41)
Albumin: 2.2 g/dL — ABNORMAL LOW (ref 3.5–5.0)
Alkaline Phosphatase: 66 U/L (ref 38–126)
Anion gap: 3 — ABNORMAL LOW (ref 5–15)
BUN: 15 mg/dL (ref 8–23)
CO2: 22 mmol/L (ref 22–32)
Calcium: 8.7 mg/dL — ABNORMAL LOW (ref 8.9–10.3)
Chloride: 112 mmol/L — ABNORMAL HIGH (ref 98–111)
Creatinine, Ser: 0.97 mg/dL (ref 0.44–1.00)
GFR, Estimated: >60 mL/min (ref >60)
Glucose, Bld: 190 mg/dL — ABNORMAL HIGH (ref 70–99)
Potassium: 4.6 mmol/L (ref 3.5–5.1)
Sodium: 137 mmol/L (ref 135–145)
Total Bilirubin: 0.3 mg/dL (ref 0.3–1.2)
Total Protein: 5.5 g/dL — ABNORMAL LOW (ref 6.5–8.1)

## 2021-01-07 LAB — GLUCOSE, CAPILLARY
Glucose-Capillary: 194 mg/dL — ABNORMAL HIGH (ref 70–99)
Glucose-Capillary: 155 mg/dL — ABNORMAL HIGH (ref 70–99)
Glucose-Capillary: 132 mg/dL — ABNORMAL HIGH (ref 70–99)

## 2021-01-07 LAB — PHOSPHORUS: Phosphorus: 3 mg/dL (ref 2.5–4.6)

## 2021-01-07 LAB — TRIGLYCERIDES: Triglycerides: 108 mg/dL (ref <150)

## 2021-01-07 LAB — MAGNESIUM: Magnesium: 1.8 mg/dL (ref 1.7–2.4)

## 2021-01-07 MED ORDER — TRAVASOL 10 % IV SOLN
INTRAVENOUS | Status: AC
  Filled 2021-01-07: qty 900

## 2021-01-07 MED ORDER — MAGNESIUM SULFATE 2 GM/50ML IV SOLN
2.0000 g | Freq: Once | INTRAVENOUS | Status: AC
  Administered 2021-01-07: 2 g via INTRAVENOUS
  Filled 2021-01-07: qty 50

## 2021-01-07 MED ORDER — KCL-LACTATED RINGERS-D5W 20 MEQ/L IV SOLN
INTRAVENOUS | Status: AC
  Filled 2021-01-07 (×2): qty 1000

## 2021-01-07 MED ORDER — LABETALOL HCL 5 MG/ML IV SOLN
10.0000 mg | INTRAVENOUS | Status: DC | PRN
  Administered 2021-01-12 – 2021-01-16 (×4): 10 mg via INTRAVENOUS
  Filled 2021-01-07 (×8): qty 4

## 2021-01-07 MED ORDER — CLONIDINE HCL 0.1 MG/24HR TD PTWK
0.1000 mg | MEDICATED_PATCH | TRANSDERMAL | Status: DC
  Administered 2021-01-07 – 2021-01-14 (×2): 0.1 mg via TRANSDERMAL
  Filled 2021-01-07 (×2): qty 1

## 2021-01-07 NOTE — Progress Notes (Signed)
PROGRESS NOTE  Amanda Lin [id number] DOB: 08-Jun-1955   PCP: Nicolette Bang, MD  Patient is from: Home  DOA: 01/02/2021 LOS: 5  Chief complaints:  Chief Complaint  Patient presents with   Abdominal Pain   Constipation     Brief Narrative / Interim history: 65 year old F with PMH of endometrial cancer, DM-2, HTN and COVID-19 infection in 11/2020 presenting with abdominal pain and emesis for about 3 weeks.  CT abdomen and pelvis showed ileus with possible evolving bowel obstruction, progression of ascites and omental soft tissue nodularity with caking, worsening bilateral hydronephrosis with obstructive uropathy and other findings as noted on CT abdomen and pelvis.  Patient was started on NG tube which was discontinued on 8/31.  She had emesis on 9/1.  KUB with increased small bowel distention with multiple fluid levels and paucity of distal gas concerning for bowel obstruction.  NG tube replaced on 9/1.  TPN initiated on 9/2.  Oncology consulted and started palliative chemotherapy on 9/2.  Subjective: Seen and examined earlier this morning.  No major events overnight of this morning.  She had some abdominal pain last night that has resolved with pain medication.  She denies nausea and vomiting.  No flatus or bowel movement yet.  She denies chest pain or dyspnea.  Objective: Vitals:   01/06/21 2301 01/07/21 0025 01/07/21 0432 01/07/21 1045  BP: (!) 196/98 (!) 176/77 (!) 183/94 (!) 171/74  Pulse: 65 65 64 64  Resp: '18  14 16  '$ Temp: 97.7 F (36.5 C)  97.9 F (36.6 C) 97.7 F (36.5 C)  TempSrc: Axillary  Oral Oral  SpO2: 96%  100% 100%  Weight:   82.1 kg     Intake/Output Summary (Last 24 hours) at 01/07/2021 1431 Last data filed at 01/07/2021 0441 Gross per 24 hour  Intake 2501.91 ml  Output 900 ml  Net 1601.91 ml   Filed Weights   01/06/21 0459 01/06/21 0903 01/07/21 0432  Weight: 81.1 kg 82 kg 82.1 kg    Examination:  GENERAL: No apparent distress.   Nontoxic. HEENT: MMM.  Vision and hearing grossly intact.  NGT to wall suction.  Bilious output in canister. NECK: Supple.  No apparent JVD.  RESP:  No IWOB.  Fair aeration bilaterally. CVS:  RRR. Heart sounds normal.  ABD/GI/GU: BS+. Abd soft, NTND but slightly firm.  MSK/EXT:  Moves extremities. No apparent deformity.  2+ nonpitting edema. SKIN: no apparent skin lesion or wound NEURO: Awake and alert. Oriented appropriately.  No apparent focal neuro deficit. PSYCH: Calm. Normal affect.   Procedures:  Nasogastric tube 8/28-8/31  Microbiology summarized: Blood cultures NGTD. Urine culture with insignificant growth.  Assessment & Plan: Possible small bowel obstruction-noted on CT abdomen and pelvis on 8/29.  Repeat abdominal x-ray on 8/30 with normal bowel gas pattern.  NG tube removed on 8/31.  Then she had emesis on 9/1.  Repeat KUB with increased small bowel distention, multiple fluid levels and paucity of distal gas concerning for obstruction. -NGT replaced on 9/1. -Remains n.p.o. except sips and ice chips. -Mobilize in the hall every 4 hours while awake. -On IV fluid and TPN 9/2>> -IV PPI   Acute UTI: Heart dysuria.  UA with pyuria.  Urine culture with insignificant growth. -Completed 3 doses of IV ceftriaxone empirically  Progressive endometrial carcinoma -Appreciate oncology-started palliative chemotherapy on 9/2.  Bilateral hydronephrosis left greater than right -Appreciate urology-supportive care as long as renal function remained stable.   Essential hypertension: SBP elevated to  180s. -Clonidine patch while NPO -As needed labetalol with parameters  CKD-3A/azotemia Recent Labs    11/17/20 0932 12/22/20 1219 12/27/20 1139 12/30/20 1407 01/02/21 0931 01/03/21 0440 01/04/21 0543 01/05/21 0516 01/06/21 0458 01/07/21 0647  BUN 8 18 30* 27* 34* 31* 26* '20 17 15  '$ CREATININE 0.77 1.14* 1.45* 1.44* 1.30* 1.15* 1.05* 0.85 0.98 0.97  -Continue  monitoring  Hypokalemia/hypomagnesemia: Resolved.  Goal of care: grim prognosis with advanced endometrial carcinoma.  Currently on palliative chemo -Appreciate help by palliative care and oncology  Class I obesity/severe protein calorie malnutrition Body mass index is 34.2 kg/m. Nutrition Problem: Inadequate oral intake Etiology: inability to eat Signs/Symptoms: NPO status Interventions: TPN   DVT prophylaxis:  enoxaparin (LOVENOX) injection 40 mg Start: 01/02/21 1600 SCDs Start: 01/02/21 1225  Code Status: Full code Family Communication: Updated patient's sister at bedside on 9/2.  None at bedside today. Level of care: Med-Surg Status is: Inpatient  Remains inpatient appropriate because:Ongoing diagnostic testing needed not appropriate for outpatient work up, IV treatments appropriate due to intensity of illness or inability to take PO, and Inpatient level of care appropriate due to severity of illness  Dispo: The patient is from: Home              Anticipated d/c is to: Home              Patient currently is not medically stable to d/c.   Difficult to place patient No       Consultants:  Oncology Urology   Sch Meds:  Scheduled Meds:  acetaminophen  650 mg Oral Once   Chlorhexidine Gluconate Cloth  6 each Topical Daily   cloNIDine  0.1 mg Transdermal Weekly   enoxaparin (LOVENOX) injection  40 mg Subcutaneous Q24H   insulin aspart  0-9 Units Subcutaneous Q6H   pantoprazole (PROTONIX) IV  40 mg Intravenous Q24H   Continuous Infusions:  dextrose 5% lactated ringers with KCl 20 mEq/L 80 mL/hr at 01/07/21 0025   dextrose 5% lactated ringers with KCl 20 mEq/L     TPN ADULT (ION) 40 mL/hr at 01/06/21 1721   TPN ADULT (ION)     PRN Meds:.acetaminophen **OR** acetaminophen, alum & mag hydroxide-simeth, bisacodyl, labetalol, morphine injection, [DISCONTINUED] ondansetron **OR** ondansetron (ZOFRAN) IV, phenol, prochlorperazine  Antimicrobials: Anti-infectives (From  admission, onward)    Start     Dose/Rate Route Frequency Ordered Stop   01/02/21 1400  cefTRIAXone (ROCEPHIN) 2 g in sodium chloride 0.9 % 100 mL IVPB        2 g 200 mL/hr over 30 Minutes Intravenous Every 24 hours 01/02/21 1256 01/04/21 1342   01/02/21 1300  cefTRIAXone (ROCEPHIN) 1 g in sodium chloride 0.9 % 100 mL IVPB  Status:  Discontinued        1 g 200 mL/hr over 30 Minutes Intravenous Every 12 hours 01/02/21 1248 01/02/21 1254        I have personally reviewed the following labs and images: CBC: Recent Labs  Lab 01/02/21 0931 01/03/21 0440 01/05/21 0516 01/06/21 0458  WBC 8.8 5.7 9.0 8.6  NEUTROABS  --   --   --  6.2  HGB 11.8* 11.0* 10.1* 10.6*  HCT 36.4 34.3* 32.1* 32.8*  MCV 90.1 90.5 90.4 89.9  PLT 359 308 273 235   BMP &GFR Recent Labs  Lab 01/02/21 2234 01/03/21 0440 01/04/21 0543 01/05/21 0516 01/06/21 0458 01/07/21 0647  NA  --  143 143 140 138 137  K  --  3.0*  4.3 3.9 4.1 4.6  CL  --  113* 115* 114* 108 112*  CO2  --  19* 21* '22 23 22  '$ GLUCOSE  --  88 94 92 123* 190*  BUN  --  31* 26* '20 17 15  '$ CREATININE  --  1.15* 1.05* 0.85 0.98 0.97  CALCIUM  --  8.4* 8.4* 8.8* 8.7* 8.7*  MG 1.5*  --  1.9 1.7 1.9 1.8  PHOS  --   --   --  2.3* 2.5 3.0   Estimated Creatinine Clearance: 56.9 mL/min (by C-G formula based on SCr of 0.97 mg/dL). Liver & Pancreas: Recent Labs  Lab 01/02/21 0931 01/05/21 0516 01/06/21 0458 01/07/21 0647  AST 14*  --  11* 15  ALT 13  --  9 10  ALKPHOS 81  --  71 66  BILITOT 1.0  --  0.4 0.3  PROT 6.4*  --  5.4* 5.5*  ALBUMIN 2.9* 2.3* 2.3* 2.2*   Recent Labs  Lab 01/02/21 0931  LIPASE 24   No results for input(s): AMMONIA in the last 168 hours. Diabetic: No results for input(s): HGBA1C in the last 72 hours. Recent Labs  Lab 01/06/21 1234 01/06/21 1738 01/07/21 0436  GLUCAP 135* 129* 194*   Cardiac Enzymes: No results for input(s): CKTOTAL, CKMB, CKMBINDEX, TROPONINI in the last 168 hours. No results for  input(s): PROBNP in the last 8760 hours. Coagulation Profile: No results for input(s): INR, PROTIME in the last 168 hours. Thyroid Function Tests: No results for input(s): TSH, T4TOTAL, FREET4, T3FREE, THYROIDAB in the last 72 hours. Lipid Profile: Recent Labs    01/07/21 1216  TRIG 108   Anemia Panel: No results for input(s): VITAMINB12, FOLATE, FERRITIN, TIBC, IRON, RETICCTPCT in the last 72 hours. Urine analysis:    Component Value Date/Time   COLORURINE YELLOW 01/02/2021 0931   APPEARANCEUR CLOUDY (A) 01/02/2021 0931   LABSPEC 1.015 01/02/2021 0931   PHURINE 5.0 01/02/2021 0931   GLUCOSEU NEGATIVE 01/02/2021 0931   HGBUR MODERATE (A) 01/02/2021 0931   BILIRUBINUR NEGATIVE 01/02/2021 0931   BILIRUBINUR moderate (A) 02/23/2020 0946   KETONESUR 5 (A) 01/02/2021 0931   PROTEINUR 100 (A) 01/02/2021 0931   UROBILINOGEN 0.2 02/23/2020 0946   NITRITE NEGATIVE 01/02/2021 0931   LEUKOCYTESUR LARGE (A) 01/02/2021 0931   Sepsis Labs: Invalid input(s): PROCALCITONIN, [place]  Microbiology: Recent Results (from the past 240 hour(s))  Urine Culture     Status: Abnormal   Collection Time: 01/02/21 12:35 PM   Specimen: Urine, Clean Catch  Result Value Ref Range Status   Specimen Description   Final    URINE, CLEAN CATCH Performed at [facility], [place] [address], [place], [place] [address]    Special Requests   Final    NONE Performed at [facility], [place] [address], [place], [place] [address]    Culture (A)  Final    <10,000 COLONIES/mL INSIGNIFICANT GROWTH Performed at [employer] [address] [place], [place] [address]    Report Status 01/04/2021 FINAL  Final  Culture, blood (routine x 2)     Status: None (Preliminary result)   Collection Time: 01/02/21  9:02 PM   Specimen: Left Antecubital; Blood  Result Value Ref Range Status   Specimen Description   Final    LEFT ANTECUBITAL Performed at [place] [address], [place], [place] [address]    Special Requests   Final    BOTTLES DRAWN  AEROBIC ONLY Blood Culture results may not be optimal due to an inadequate volume of blood received in culture bottles Performed at [facility], [place] [address] [place], [place] [address]    Culture   Final    NO GROWTH 4 DAYS Performed at [facility], [place] [address], [place], [place] [address]    Report Status PENDING  Incomplete    Radiology Studies: No results found.    Amanda Lin  If 7PM-7AM, please contact night-coverage [email or web address] 01/07/2021, 2:31 PM

## 2021-01-07 NOTE — Consult Note (Signed)
Consultation Note Date: 01/07/2021   Patient Name: Amanda Lin  DOB: 08/23/1955  MRN: [id number]  Age / Sex: 65 y.o., female  PCP: Amanda Bang, MD Referring Physician: Mercy Riding, MD  Reason for Consultation: Establishing goals of care  HPI/Patient Profile: 65 y.o. female   admitted on 01/02/2021   Clinical Assessment and Goals of Care: 65 year old lady originally from [place] and [place], history of endometrial cancer diabetes hypertension COVID-19 infection in July.  Patient presented with vomiting and abdominal pain CT scan showed ileus possible evolving bowel obstruction progression of ascites omental caking worsening bilateral hydronephrosis with obstructive uropathy.  Patient admitted to hospital medicine service, being followed along with medical oncology.  Patient has been started on carboplatin and Taxol and Herceptin.  Remains on NG tube because of abdominal carcinomatosis and recent ongoing nausea vomiting. Palliative medicine consultation has been requested for ongoing goals of care discussions. Amanda Lin is awake alert resting in bed.  I introduced myself and palliative care as follows: Palliative medicine is specialized medical care for people living with serious illness. It focuses on providing relief from the symptoms and stress of a serious illness. The goal is to improve quality of life for both the patient and the family. Goals of care: Broad aims of medical therapy in relation to the patient's values and preferences. Our aim is to provide medical care aimed at enabling patients to achieve the goals that matter most to them, given the circumstances of their particular medical situation and their constraints.  Brief life review performed.  Patient is originally from [place] and [place].  Her daughter was born in [place] and her son was born here in [place], [place].  She is married.  She has 2 sisters who live in [place] and [place].  She states that her family and faith are very important to her. Goals wishes and values attempted to be explored.  Discussed about patient's underlying serious illness as well as scope of current hospitalization.  Offered active listening and supportive care.  All of her questions addressed to the best of my ability.  HCPOA Advance care planning documents are noted on the chart, these have been scanned into [employer] by the undersigned.  Patient elects her daughter to be her chosen healthcare power of attorney agent.  SUMMARY OF RECOMMENDATIONS   Full code/full scope care for now.  Patient understands the serious nature of her illness however remains invested in continuing with TPN and chemotherapy for now. Continue efforts at symptom management.  Morphine IV as needed.  Remains on TPN.  Remains on NG tube.  Ice pack or heat as per patient preference for discomfort in left upper extremity/antecubital fossa.  Advance care planning documents have been completed and scanned into the chart. Thank you for the consult.  Code Status/Advance Care Planning: Full code   Symptom Management:    Palliative Prophylaxis:  Frequent Pain Assessment  Additional Recommendations (Limitations, Scope, Preferences): Full Scope Treatment  Psycho-social/Spiritual:  Desire for further  Chaplaincy support:yes Additional Recommendations: Caregiving  Support/Resources  Prognosis:  Unable to determine  Discharge Planning: To Be Determined      Primary Diagnoses: Present on Admission:  Cancer associated pain  Ileus ([place])  Endometrial carcinoma ([place])  Nausea with vomiting  Acute cystitis with hematuria  Obstructive uropathy  Hypokalemia   I have reviewed the medical record, interviewed the patient and family, and examined the patient. The following aspects are pertinent.  Past Medical History:  Diagnosis Date   Diabetes  mellitus without complication ([place])    Endometrial cancer ([place])    Hypertension    Social History   Socioeconomic History   Marital status: Married    Spouse name: Amanda Lin   Number of children: 2   Years of education: Not on file   Highest education level: Not on file  Occupational History   Occupation: retired  Tobacco Use   Smoking status: Never   Smokeless tobacco: Never  Vaping Use   Vaping Use: Never used  Substance and Sexual Activity   Alcohol use: Yes    Comment: ocassionally   Drug use: No   Sexual activity: Yes  Other Topics Concern   Not on file  Social History Narrative   Not on file   Social Determinants of Health   Financial Resource Strain: Not on file  Food Insecurity: Food Insecurity Present   Worried About [place] in the Last Year: Often true   [place] in the Last Year: Sometimes true  Transportation Needs: No Transportation Needs   Lack of Transportation (Medical): No   Lack of Transportation (Non-Medical): No  Physical Activity: Not on file  Stress: Not on file  Social Connections: Not on file   Family History  Problem Relation Age of Onset   Cancer Mother        uterine diagnosed around 79   Prostate cancer Other    Scheduled Meds:  acetaminophen  650 mg Oral Once   Chlorhexidine Gluconate Cloth  6 each Topical Daily   cloNIDine  0.1 mg Transdermal Weekly   enoxaparin (LOVENOX) injection  40 mg Subcutaneous Q24H   insulin aspart  0-9 Units Subcutaneous Q6H   pantoprazole (PROTONIX) IV  40 mg Intravenous Q24H   Continuous Infusions:  dextrose 5% lactated ringers with KCl 20 mEq/L 80 mL/hr at 01/07/21 0025   dextrose 5% lactated ringers with KCl 20 mEq/L     TPN ADULT (ION) 40 mL/hr at 01/06/21 1721   TPN ADULT (ION)     PRN Meds:.acetaminophen **OR** acetaminophen, alum & mag hydroxide-simeth, bisacodyl, labetalol, morphine injection, [DISCONTINUED] ondansetron **OR** ondansetron (ZOFRAN) IV, phenol,  prochlorperazine Medications Prior to Admission:  Prior to Admission medications   Medication Sig Start Date End Date Taking? Authorizing Provider  atenolol (TENORMIN) 50 MG tablet Take 1 tablet (50 mg total) by mouth daily. 12/27/20  Yes Gorsuch, Ni, MD  chlorhexidine (PERIDEX) 0.12 % solution 15 mLs 2 (two) times daily. 11/26/20  Yes [provider]  lidocaine-prilocaine (EMLA) cream Apply to affected area once 05/31/20  Yes Gorsuch, Ni, MD  losartan (COZAAR) 100 MG tablet Take 1 tablet (100 mg total) by mouth daily. 07/14/20  Yes Gorsuch, Ni, MD  magnesium oxide (MAG-OX) 400 (240 Mg) MG tablet Take 1 tablet (400 mg total) by mouth daily. 10/20/20  Yes Gorsuch, Ni, MD  ondansetron (ZOFRAN) 8 MG tablet Take 1 tablet (8 mg total) by mouth every 8 (eight) hours as needed for refractory nausea /  vomiting. Start on day 3 after carboplatin chemo. 05/31/20  Yes Gorsuch, Ni, MD  oxyCODONE 10 MG TABS Take 1 tablet (10 mg total) by mouth every 4 (four) hours as needed for severe pain. 12/27/20  Yes Gorsuch, Ni, MD  polyethylene glycol (MIRALAX / GLYCOLAX) 17 g packet Take 17 g by mouth daily as needed for mild constipation.   Yes [provider]  prochlorperazine (COMPAZINE) 10 MG tablet TAKE 1 TABLET(10 MG) BY MOUTH EVERY 6 HOURS AS NEEDED FOR NAUSEA OR VOMITING Patient taking differently: Take 10 mg by mouth every 6 (six) hours as needed for nausea or vomiting. 12/06/20  Yes Gorsuch, Ni, MD  sennosides-docusate sodium (SENOKOT-S) 8.6-50 MG tablet Take 1 tablet by mouth daily as needed for constipation.   Yes [provider]  amLODipine (NORVASC) 10 MG tablet TAKE 1 TABLET(10 MG) BY MOUTH DAILY Patient not taking: No sig reported 12/15/20   [name], MD   No Known Allergies Review of Systems Pain in left upper extremity at the site of IV infiltration. Physical Exam Awake alert oriented resting in bed Has NG tube complains of sore throat and states that throat spray is helping No  acute distress Regular work of breathing Abdomen is soft, mild generalized tenderness, patient states that she is not passing gas however does since the feeling that she is having some vomiting within her stomach. Trace edema No focal deficits Mood and affect is within normal limits  Vital Signs: BP (!) 171/74 (BP Location: Right Arm)   Pulse 64   Temp 97.7 F (36.5 C) (Oral)   Resp 16   Wt 82.1 kg   SpO2 100%   BMI 34.20 kg/m  Pain Scale: 0-10   Pain Score: 3    SpO2: SpO2: 100 % O2 Device:SpO2: 100 % O2 Flow Rate: .   IO: Intake/output summary:  Intake/Output Summary (Last 24 hours) at 01/07/2021 1311 Last data filed at 01/07/2021 0441 Gross per 24 hour  Intake 2501.91 ml  Output 900 ml  Net 1601.91 ml    LBM: Last BM Date: 01/04/21 Baseline Weight: Weight: 80.7 kg Most recent weight: Weight: 82.1 kg     Palliative Assessment/Data:   PPS 40%  Time In: 12 Time Out: 1300 Time Total: 60  Greater than 50%  of this time was spent counseling and coordinating care related to the above assessment and plan.  Signed by: Loistine Chance, MD   Please contact Palliative Medicine Team phone at [phone] for questions and concerns.  For individual provider: See Shea Evans

## 2021-01-07 NOTE — Progress Notes (Signed)
PHARMACY - TOTAL PARENTERAL NUTRITION CONSULT NOTE   Indication: Prolonged ileus  Patient Measurements: Weight: 82.1 kg (181 lb)   Body mass index is 34.2 kg/m. Usual Weight:   Assessment: Patient is a 65 y.o F with endometrial cancer presented to the ED on 01/02/21 with c/o abdominal pain and n/v.  Abd CT on 8/29 showed findings consistent with ileus and disease progress. Plan is to start chemotherapy with this admission and to start TPN on 9/2 for ileus.  Glucose / Insulin: not on insulin - cbgs (goal <150): range 129 - 194; CL and CO2 wnl - 2 units of insulin given in past 24 hours  Electrolytes: Magnesium 1.8, Other electrolytes WNL, corrCa 10,   - goal K >4, mag >2 for ileus Renal: scr <1  Hepatic: AST low, TBili wnl Intake / Output; MIVF:  - D5 LR with 20 meq KCL @ 80  ml/hr - I/O: +1201 ml GI Imaging: - 8/29 abd CT: Interval progression of ascites with progression of omental soft tissue nodularity now demonstrating areas of confluent caking. Small bowel is mildly distended and fluid-filled up to 3 cm diameter and diffusely fluid-filled. Imaging features are compatible with ileus. Interval worsening of mild right and moderate left hydronephrosis with decreased perfusion to the left kidney compatible with obstructive uropathy GI Surgeries / Procedures:  - has NG tube  Central access: port-a-cath TPN start date: 01/06/21  Nutritional Goals: Goal TPN rate is  100 mL/hr (provides 120 g of protein and 2352 kcals per day)  RD Assessment (01/06/21): Kcal:  [id number] kcal Protein:  115-130 gm Fluid:   > 2.2 L/day  Current Nutrition:  Clear liquids  Plan:   Now: - magnesium sulfate 2 grams x 1    At 18:00  Increase TPN to  75  mL/hr at 1800 Electrolytes in TPN: Na 1104mq/L, K 420m/L, Ca 104m39mL, Mg 8 mEq/L, and Phos 1104m60mL. Cl:Ac 1:1 Add standard MVI and trace elements to TPN Continue  Sensitive q6h SSI and adjust as needed  Reduce MIVF to 50  mL/hr at 1800 Monitor TPN  labs on Mon/Thurs Bmet, phos, mag  with AM labs     NikoRoyetta AsalarmD, BCPS 01/07/2021 9:36 AM

## 2021-01-08 ENCOUNTER — Inpatient Hospital Stay (HOSPITAL_COMMUNITY): Payer: 59

## 2021-01-08 ENCOUNTER — Inpatient Hospital Stay: Payer: Self-pay

## 2021-01-08 DIAGNOSIS — C541 Malignant neoplasm of endometrium: Secondary | ICD-10-CM | POA: Diagnosis not present

## 2021-01-08 DIAGNOSIS — N3001 Acute cystitis with hematuria: Secondary | ICD-10-CM | POA: Diagnosis not present

## 2021-01-08 DIAGNOSIS — G893 Neoplasm related pain (acute) (chronic): Secondary | ICD-10-CM | POA: Diagnosis not present

## 2021-01-08 DIAGNOSIS — E876 Hypokalemia: Secondary | ICD-10-CM | POA: Diagnosis not present

## 2021-01-08 LAB — GLUCOSE, CAPILLARY
Glucose-Capillary: 108 mg/dL — ABNORMAL HIGH (ref 70–99)
Glucose-Capillary: 133 mg/dL — ABNORMAL HIGH (ref 70–99)
Glucose-Capillary: 161 mg/dL — ABNORMAL HIGH (ref 70–99)
Glucose-Capillary: 164 mg/dL — ABNORMAL HIGH (ref 70–99)

## 2021-01-08 LAB — BASIC METABOLIC PANEL
Anion gap: 4 — ABNORMAL LOW (ref 5–15)
BUN: 25 mg/dL — ABNORMAL HIGH (ref 8–23)
CO2: 23 mmol/L (ref 22–32)
Calcium: 8.8 mg/dL — ABNORMAL LOW (ref 8.9–10.3)
Chloride: 111 mmol/L (ref 98–111)
Creatinine, Ser: 0.99 mg/dL (ref 0.44–1.00)
GFR, Estimated: >60 mL/min (ref >60)
Glucose, Bld: 138 mg/dL — ABNORMAL HIGH (ref 70–99)
Potassium: 4.6 mmol/L (ref 3.5–5.1)
Sodium: 138 mmol/L (ref 135–145)

## 2021-01-08 LAB — CULTURE, BLOOD (ROUTINE X 2): Culture: NO GROWTH

## 2021-01-08 LAB — PHOSPHORUS: Phosphorus: 2.9 mg/dL (ref 2.5–4.6)

## 2021-01-08 LAB — MAGNESIUM: Magnesium: 2.2 mg/dL (ref 1.7–2.4)

## 2021-01-08 MED ORDER — MORPHINE SULFATE (PF) 2 MG/ML IV SOLN
2.0000 mg | INTRAVENOUS | Status: DC | PRN
  Administered 2021-01-08 – 2021-01-16 (×57): 2 mg via INTRAVENOUS
  Filled 2021-01-08 (×59): qty 1

## 2021-01-08 MED ORDER — KETOROLAC TROMETHAMINE 15 MG/ML IJ SOLN
15.0000 mg | Freq: Three times a day (TID) | INTRAMUSCULAR | Status: AC | PRN
  Administered 2021-01-09 – 2021-01-11 (×3): 15 mg via INTRAVENOUS
  Filled 2021-01-08 (×3): qty 1

## 2021-01-08 MED ORDER — KCL-LACTATED RINGERS-D5W 20 MEQ/L IV SOLN
INTRAVENOUS | Status: DC
  Filled 2021-01-08 (×2): qty 1000

## 2021-01-08 MED ORDER — TRAVASOL 10 % IV SOLN
INTRAVENOUS | Status: AC
  Filled 2021-01-08: qty 1200

## 2021-01-08 MED ORDER — SODIUM CHLORIDE 0.9% FLUSH
10.0000 mL | Freq: Two times a day (BID) | INTRAVENOUS | Status: DC
  Administered 2021-01-08 – 2021-01-09 (×2): 10 mL
  Administered 2021-01-09: 20 mL
  Administered 2021-01-10 – 2021-01-19 (×18): 10 mL

## 2021-01-08 MED ORDER — SODIUM CHLORIDE 0.9% FLUSH
10.0000 mL | INTRAVENOUS | Status: DC | PRN
  Administered 2021-01-11: 10 mL

## 2021-01-08 NOTE — Progress Notes (Signed)
PHARMACY - TOTAL PARENTERAL NUTRITION CONSULT NOTE   Indication: Prolonged ileus  Patient Measurements: Weight: 102.4 kg (225 lb 12 oz)   Body mass index is 42.66 kg/m. Usual Weight:   Assessment: Patient is a 65 y.o F with endometrial cancer presented to the ED on 01/02/21 with c/o abdominal pain and n/v.  Abd CT on 8/29 showed findings consistent with ileus and disease progress. Plan is to start chemotherapy with this admission and to start TPN on 9/2 for ileus.  Glucose / Insulin: not on insulin - cbgs (goal <150): range 132 - 164; CL and CO2 wnl - 7 units of insulin given in past 24 hours  Electrolytes: Electrolytes WNL, corrCa 10,   - goal K >4, mag >2 for ileus Renal: scr <1  Hepatic: LFT WNL, TBili wnl. Triglycerides 108 ( 9/3)  Intake / Output; MIVF:  - D5 LR with 20 meq KCL @ 50  ml/hr - I/O: +1201 ml GI Imaging: - 8/29 abd CT: Interval progression of ascites with progression of omental soft tissue nodularity now demonstrating areas of confluent caking. Small bowel is mildly distended and fluid-filled up to 3 cm diameter and diffusely fluid-filled. Imaging features are compatible with ileus. Interval worsening of mild right and moderate left hydronephrosis with decreased perfusion to the left kidney compatible with obstructive uropathy GI Surgeries / Procedures:  - has NG tube  Central access: port-a-cath TPN start date: 01/06/21  Nutritional Goals: Goal TPN rate is  100 mL/hr (provides 120 g of protein and 2352 kcals per day)  RD Assessment (01/06/21): Kcal:  [id number] kcal Protein:  115-130 gm Fluid:   > 2.2 L/day  Current Nutrition:  Clear liquids  Plan:     At 18:00  Increase TPN to 100  mL/hr at 1800, the goal rate  Electrolytes in TPN: Na 18mq/L, K 430m/L, Ca 22m66mL, Mg 8 mEq/L, and Phos 122m44mL. Cl:Ac 1:1 Add standard MVI and trace elements to TPN Continue  Sensitive q6h SSI and adjust as needed  Reduce MIVF to  25 mL/hr at 1800 Monitor TPN labs on  Mon/Thurs    NikoRoyetta AsalarmD, BCPS 01/08/2021 9:40 AM

## 2021-01-08 NOTE — Progress Notes (Signed)
Secure chat sent to Dr. Cyndia Skeeters regarding a possible PICC placement order. Pt has very poor vasculature, and continues to lose PIV access. Awaiting MD response.

## 2021-01-08 NOTE — Progress Notes (Signed)
Peripherally Inserted Central Catheter Placement  The IV Nurse has discussed with the patient and/or persons authorized to consent for the patient, the purpose of this procedure and the potential benefits and risks involved with this procedure.  The benefits include less needle sticks, lab draws from the catheter, and the patient may be discharged home with the catheter. Risks include, but not limited to, infection, bleeding, blood clot (thrombus formation), and puncture of an artery; nerve damage and irregular heartbeat and possibility to perform a PICC exchange if needed/ordered by physician.  Alternatives to this procedure were also discussed.  B[facility] Power PICC patient education guide, fact sheet on infection prevention and patient information card has been provided to patient /or left at bedside.    PICC Placement Documentation  PICC Single Lumen 01/08/21 Right Brachial 37 cm 0 cm (Active)  Indication for Insertion or Continuance of Line Limited venous access - need for IV therapy >5 days (PICC only) 01/08/21 1720  Exposed Catheter (cm) 0 cm 01/08/21 1720  Site Assessment Clean;Dry;Intact 01/08/21 1720  Line Status Flushed;Saline locked;Blood return noted 01/08/21 1720  Dressing Type Transparent 01/08/21 1720  Dressing Status Clean;Dry;Intact 01/08/21 1720  Antimicrobial disc in place? Yes 01/08/21 1720  Safety Lock Not Applicable [id number]  Line Care Tubing changed;Connections checked and tightened 01/08/21 1720  Line Adjustment (NICU/IV Team Only) No 01/08/21 1720  Dressing Intervention New dressing 01/08/21 1720  Dressing Change Due 01/15/21 01/08/21 1720       Rolena Infante 01/08/2021, 5:22 PM

## 2021-01-08 NOTE — Progress Notes (Signed)
PROGRESS NOTE  ALENIA STINAR [id number] DOB: [birth year]   PCP: Nicolette Bang, MD  Patient is from: Home  DOA: 01/02/2021 LOS: 6  Chief complaints:  Chief Complaint  Patient presents with   Abdominal Pain   Constipation     Brief Narrative / Interim history: 65 year old F with PMH of endometrial cancer, DM-2, HTN and COVID-19 infection in 11/2020 presenting with abdominal pain and emesis for about 3 weeks.  CT abdomen and pelvis showed ileus with possible evolving bowel obstruction, progression of ascites and omental soft tissue nodularity with caking, worsening bilateral hydronephrosis with obstructive uropathy and other findings as noted on CT abdomen and pelvis.  Patient was started on NG tube which was discontinued on 8/31.  She had emesis on 9/1.  KUB with increased small bowel distention with multiple fluid levels and paucity of distal gas concerning for bowel obstruction.  NG tube replaced on 9/1.  TPN initiated on 9/2.  Oncology consulted and started palliative chemotherapy on 9/2.  Subjective: Seen and examined earlier this morning.  Had abdominal pain last night that has improved with pain medication.  Also swelling in left arm from previous IV line.  She has BLE edema as well.  She denies chest pain, dyspnea, nausea or vomiting.  Currently no abdominal pain.  She is asking if there is a medication she can try for pain other than Dilaudid.  No bowel movement or flatus yet.  Objective: Vitals:   01/07/21 1045 01/07/21 1755 01/07/21 2124 01/08/21 0557  BP: (!) 171/74 (!) 166/70 (!) 176/73 (!) 170/75  Pulse: 64 (!) 59 70 72  Resp: '16 16 14 14  '$ Temp: 97.7 F (36.5 C) (!) 97.5 F (36.4 C) 98.1 F (36.7 C) 98 F (36.7 C)  TempSrc: Oral Oral Oral Oral  SpO2: 100% 96% 97% 100%  Weight:    102.4 kg    Intake/Output Summary (Last 24 hours) at 01/08/2021 1338 Last data filed at 01/08/2021 [id number] Gross per 24 hour  Intake 1229.44 ml  Output 1850 ml  Net -620.56  ml   Filed Weights   01/06/21 0903 01/07/21 0432 01/08/21 0557  Weight: 82 kg 82.1 kg 102.4 kg    Examination:  GENERAL: No apparent distress.  Nontoxic. HEENT: MMM.  Vision and hearing grossly intact.  NGT to wall suction. NECK: Supple.  No apparent JVD.  RESP: On RA.  No IWOB.  Fair aeration bilaterally. CVS:  RRR. Heart sounds normal.  ABD/GI/GU: BS+. Abd soft, NTND but slightly firm.  MSK/EXT:  Moves extremities. No apparent deformity.  2+ nonpitting BLE edema.  LUE swelling SKIN: no apparent skin lesion or wound NEURO: Awake and alert. Oriented appropriately.  No apparent focal neuro deficit. PSYCH: Calm. Normal affect.   Procedures:  Nasogastric tube 8/28-8/31  Microbiology summarized: Blood cultures NGTD. Urine culture with insignificant growth.  Assessment & Plan: Possible small bowel obstruction-noted on CT abdomen and pelvis on 8/29.  Repeat abdominal x-ray on 8/30 with normal bowel gas pattern.  NG tube removed on 8/31.  Then she had emesis on 9/1.  Repeat KUB with increased small bowel distention, multiple fluid levels and paucity of distal gas concerning for obstruction. -NGT replaced on 9/1. -Remains n.p.o. except sips and ice chips. -Mobilize in the hall every 4 hours while awake. -On IV fluid and TPN 9/2>> -Continue IV PPI -Place PICC line due to poor access -Try low-dose Toradol for pain.   Acute UTI: Heart dysuria.  UA with pyuria.  Urine culture  with insignificant growth. -Completed 3 doses of IV ceftriaxone empirically  Progressive endometrial carcinoma -Appreciate oncology-started palliative chemotherapy on 9/2.  Bilateral hydronephrosis left greater than right -Appreciate urology-supportive care as long as renal function remained stable.   Essential hypertension: SBP elevated but improving. -Clonidine patch while NPO-May have to increase -As needed labetalol with parameters  CKD-3A/azotemia Recent Labs    12/22/20 1219 12/27/20 1139  12/30/20 1407 01/02/21 0931 01/03/21 0440 01/04/21 0543 01/05/21 0516 01/06/21 0458 01/07/21 0647 01/08/21 0756  BUN 18 30* 27* 34* 31* 26* '20 17 15 '$ 25*  CREATININE 1.14* 1.45* 1.44* 1.30* 1.15* 1.05* 0.85 0.98 0.97 0.99  -Continue monitoring  Hypokalemia/hypomagnesemia: Resolved.  Goal of care: grim prognosis with advanced endometrial carcinoma.  Currently on palliative chemo -Appreciate help by palliative care and oncology  Class I obesity/severe protein calorie malnutrition Body mass index is 42.66 kg/m. Nutrition Problem: Inadequate oral intake Etiology: inability to eat Signs/Symptoms: NPO status Interventions: TPN   DVT prophylaxis:  enoxaparin (LOVENOX) injection 40 mg Start: 01/02/21 1600 SCDs Start: 01/02/21 1225  Code Status: Full code Family Communication: Patient and Therapist, sports.  None at bedside. Level of care: Med-Surg Status is: Inpatient  Remains inpatient appropriate because:Ongoing diagnostic testing needed not appropriate for outpatient work up, IV treatments appropriate due to intensity of illness or inability to take PO, and Inpatient level of care appropriate due to severity of illness  Dispo: The patient is from: Home              Anticipated d/c is to: Home              Patient currently is not medically stable to d/c.   Difficult to place patient No       Consultants:  Oncology Urology   Sch Meds:  Scheduled Meds:  acetaminophen  650 mg Oral Once   Chlorhexidine Gluconate Cloth  6 each Topical Daily   cloNIDine  0.1 mg Transdermal Weekly   enoxaparin (LOVENOX) injection  40 mg Subcutaneous Q24H   insulin aspart  0-9 Units Subcutaneous Q6H   pantoprazole (PROTONIX) IV  40 mg Intravenous Q24H   Continuous Infusions:  dextrose 5% lactated ringers with KCl 20 mEq/L 50 mL/hr at 01/07/21 1716   dextrose 5% lactated ringers with KCl 20 mEq/L     TPN ADULT (ION) 75 mL/hr at 01/07/21 1745   TPN ADULT (ION)     PRN Meds:.acetaminophen **OR**  acetaminophen, alum & mag hydroxide-simeth, bisacodyl, ketorolac, labetalol, morphine injection, [DISCONTINUED] ondansetron **OR** ondansetron (ZOFRAN) IV, phenol, prochlorperazine  Antimicrobials: Anti-infectives (From admission, onward)    Start     Dose/Rate Route Frequency Ordered Stop   01/02/21 1400  cefTRIAXone (ROCEPHIN) 2 g in sodium chloride 0.9 % 100 mL IVPB        2 g 200 mL/hr over 30 Minutes Intravenous Every 24 hours 01/02/21 1256 01/04/21 1342   01/02/21 1300  cefTRIAXone (ROCEPHIN) 1 g in sodium chloride 0.9 % 100 mL IVPB  Status:  Discontinued        1 g 200 mL/hr over 30 Minutes Intravenous Every 12 hours 01/02/21 1248 01/02/21 1254        I have personally reviewed the following labs and images: CBC: Recent Labs  Lab 01/02/21 0931 01/03/21 0440 01/05/21 0516 01/06/21 0458  WBC 8.8 5.7 9.0 8.6  NEUTROABS  --   --   --  6.2  HGB 11.8* 11.0* 10.1* 10.6*  HCT 36.4 34.3* 32.1* 32.8*  MCV 90.1 90.5 90.4  89.9  PLT 359 308 273 235   BMP &GFR Recent Labs  Lab 01/04/21 0543 01/05/21 0516 01/06/21 0458 01/07/21 0647 01/08/21 0756  NA 143 140 138 137 138  K 4.3 3.9 4.1 4.6 4.6  CL 115* 114* 108 112* 111  CO2 21* '22 23 22 23  '$ GLUCOSE 94 92 123* 190* 138*  BUN 26* '20 17 15 '$ 25*  CREATININE 1.05* 0.85 0.98 0.97 0.99  CALCIUM 8.4* 8.8* 8.7* 8.7* 8.8*  MG 1.9 1.7 1.9 1.8 2.2  PHOS  --  2.3* 2.5 3.0 2.9   Estimated Creatinine Clearance: 63.1 mL/min (by C-G formula based on SCr of 0.99 mg/dL). Liver & Pancreas: Recent Labs  Lab 01/02/21 0931 01/05/21 0516 01/06/21 0458 01/07/21 0647  AST 14*  --  11* 15  ALT 13  --  9 10  ALKPHOS 81  --  71 66  BILITOT 1.0  --  0.4 0.3  PROT 6.4*  --  5.4* 5.5*  ALBUMIN 2.9* 2.3* 2.3* 2.2*   Recent Labs  Lab 01/02/21 0931  LIPASE 24   No results for input(s): AMMONIA in the last 168 hours. Diabetic: No results for input(s): HGBA1C in the last 72 hours. Recent Labs  Lab 01/07/21 1245 01/07/21 1751  01/08/21 0007 01/08/21 0600 01/08/21 1213  GLUCAP 155* 132* 161* 164* 133*   Cardiac Enzymes: No results for input(s): CKTOTAL, CKMB, CKMBINDEX, TROPONINI in the last 168 hours. No results for input(s): PROBNP in the last 8760 hours. Coagulation Profile: No results for input(s): INR, PROTIME in the last 168 hours. Thyroid Function Tests: No results for input(s): TSH, T4TOTAL, FREET4, T3FREE, THYROIDAB in the last 72 hours. Lipid Profile: Recent Labs    01/07/21 1216  TRIG 108   Anemia Panel: No results for input(s): VITAMINB12, FOLATE, FERRITIN, TIBC, IRON, RETICCTPCT in the last 72 hours. Urine analysis:    Component Value Date/Time   COLORURINE YELLOW 01/02/2021 0931   APPEARANCEUR CLOUDY (A) 01/02/2021 0931   LABSPEC 1.015 01/02/2021 0931   PHURINE 5.0 01/02/2021 0931   GLUCOSEU NEGATIVE 01/02/2021 0931   HGBUR MODERATE (A) 01/02/2021 0931   BILIRUBINUR NEGATIVE 01/02/2021 0931   BILIRUBINUR moderate (A) 02/23/2020 0946   KETONESUR 5 (A) 01/02/2021 0931   PROTEINUR 100 (A) 01/02/2021 0931   UROBILINOGEN 0.2 02/23/2020 0946   NITRITE NEGATIVE 01/02/2021 0931   LEUKOCYTESUR LARGE (A) 01/02/2021 0931   Sepsis Labs: Invalid input(s): PROCALCITONIN, [place]  Microbiology: Recent Results (from the past 240 hour(s))  Urine Culture     Status: Abnormal   Collection Time: 01/02/21 12:35 PM   Specimen: Urine, Clean Catch  Result Value Ref Range Status   Specimen Description   Final    URINE, CLEAN CATCH Performed at [facility], [place] [address] [place], [place] [address]    Special Requests   Final    NONE Performed at [facility], [place] [address], [place], [place] [address]    Culture (A)  Final    <10,000 COLONIES/mL INSIGNIFICANT GROWTH Performed at [place] [address] [place], [place] [address]    Report Status 01/04/2021 FINAL  Final  Culture, blood (routine x 2)     Status: None   Collection Time:  01/02/21  9:02 PM   Specimen: Left Antecubital; Blood  Result Value Ref Range Status   Specimen Description   Final    LEFT ANTECUBITAL Performed at [place] [address], [place], [place] [address]  Special Requests   Final    BOTTLES DRAWN AEROBIC ONLY Blood Culture results may not be optimal due to an inadequate volume of blood received in culture bottles Performed at [facility] [address], [place], [place] [address]    Culture   Final    NO GROWTH 5 DAYS Performed at [facility], [place] [address] [place], [place] [address]    Report Status 01/08/2021 FINAL  Final    Radiology Studies: [place] EKG SITE RITE  Result Date: 01/08/2021 If Site Rite image not attached, placement could not be confirmed due to current cardiac rhythm.     Denetta Fei T. [place]  If 7PM-7AM, please contact night-coverage [email or web address] 01/08/2021, 1:38 PM

## 2021-01-09 DIAGNOSIS — E876 Hypokalemia: Secondary | ICD-10-CM | POA: Diagnosis not present

## 2021-01-09 DIAGNOSIS — G893 Neoplasm related pain (acute) (chronic): Secondary | ICD-10-CM | POA: Diagnosis not present

## 2021-01-09 DIAGNOSIS — C541 Malignant neoplasm of endometrium: Secondary | ICD-10-CM | POA: Diagnosis not present

## 2021-01-09 DIAGNOSIS — N3001 Acute cystitis with hematuria: Secondary | ICD-10-CM | POA: Diagnosis not present

## 2021-01-09 LAB — COMPREHENSIVE METABOLIC PANEL
ALT: 11 U/L (ref 0–44)
AST: 14 U/L — ABNORMAL LOW (ref 15–41)
Albumin: 2.1 g/dL — ABNORMAL LOW (ref 3.5–5.0)
Alkaline Phosphatase: 57 U/L (ref 38–126)
Anion gap: 7 (ref 5–15)
BUN: 33 mg/dL — ABNORMAL HIGH (ref 8–23)
CO2: 22 mmol/L (ref 22–32)
Calcium: 9 mg/dL (ref 8.9–10.3)
Chloride: 110 mmol/L (ref 98–111)
Creatinine, Ser: 0.85 mg/dL (ref 0.44–1.00)
GFR, Estimated: >60 mL/min (ref >60)
Glucose, Bld: 178 mg/dL — ABNORMAL HIGH (ref 70–99)
Potassium: 4.8 mmol/L (ref 3.5–5.1)
Sodium: 139 mmol/L (ref 135–145)
Total Bilirubin: 0.2 mg/dL — ABNORMAL LOW (ref 0.3–1.2)
Total Protein: 5.1 g/dL — ABNORMAL LOW (ref 6.5–8.1)

## 2021-01-09 LAB — GLUCOSE, CAPILLARY
Glucose-Capillary: 142 mg/dL — ABNORMAL HIGH (ref 70–99)
Glucose-Capillary: 142 mg/dL — ABNORMAL HIGH (ref 70–99)
Glucose-Capillary: 148 mg/dL — ABNORMAL HIGH (ref 70–99)
Glucose-Capillary: 148 mg/dL — ABNORMAL HIGH (ref 70–99)
Glucose-Capillary: 168 mg/dL — ABNORMAL HIGH (ref 70–99)

## 2021-01-09 LAB — MAGNESIUM: Magnesium: 2.2 mg/dL (ref 1.7–2.4)

## 2021-01-09 LAB — PHOSPHORUS: Phosphorus: 3.2 mg/dL (ref 2.5–4.6)

## 2021-01-09 LAB — TRIGLYCERIDES: Triglycerides: 156 mg/dL — ABNORMAL HIGH (ref <150)

## 2021-01-09 MED ORDER — MAGIC MOUTHWASH W/LIDOCAINE
5.0000 mL | Freq: Four times a day (QID) | ORAL | Status: AC
  Administered 2021-01-09 – 2021-01-14 (×9): 5 mL via ORAL
  Filled 2021-01-09 (×20): qty 5

## 2021-01-09 MED ORDER — TRAVASOL 10 % IV SOLN
INTRAVENOUS | Status: AC
  Filled 2021-01-09: qty 1200

## 2021-01-09 NOTE — Progress Notes (Addendum)
Mobility Specialist - Progress Note   01/09/21 1040  Mobility  Activity Transferred:  Bed to chair  Level of Assistance Minimal assist, patient does 75% or more  Assistive Device None  Mobility Sit up in bed/chair position for meals  Mobility Response Tolerated fair  Mobility performed by Mobility specialist;Nurse tech  $Mobility charge 1 Mobility     Upon entry pt reported pain in feet due to increase edema. Pt was eager to ambulate, but could not stand EOB without Min A. Pt agreed to move from bed to recliner and required Min A from both mobility specialist and NT. Pt was left in chair after session with call bell at side and chair alarm on. RN informed of session and encouraged to order PT.   [place] Specialist Acute Rehabilitation Services Phone: [phone] 01/09/21, 10:44 AM

## 2021-01-09 NOTE — Progress Notes (Signed)
Daily Progress Note   Patient Name: Amanda Lin       Date: 01/09/2021 DOB: 02-Dec-1955  Age: 65 y.o. MRN#: [id number] Attending Physician: Mercy Riding, MD Primary Care Physician: Nicolette Bang, MD Admit Date: 01/02/2021  Reason for Consultation/Follow-up: Establishing goals of care  Subjective:  Awake resting in bed. States that she sat in chair for a few hours over the weekend. Complains of pain in feet, pain in LUE along with some swelling. She is trying to take PRN IV Morphine and also elevating her LUE. She now has a PICC line in her Right upper extremity. Denies nausea or vomiting, still has NGT and green biliary appearing material in cannister. She is tolerating ice chips and they help soothe the throat discomfort from the NGT. Patient is looking forward to having her daughter come visit. Remains on TPN.   Length of Stay: 7  Current Medications: Scheduled Meds:  . acetaminophen  650 mg Oral Once  . Chlorhexidine Gluconate Cloth  6 each Topical Daily  . cloNIDine  0.1 mg Transdermal Weekly  . enoxaparin (LOVENOX) injection  40 mg Subcutaneous Q24H  . insulin aspart  0-9 Units Subcutaneous Q6H  . magic mouthwash w/lidocaine  5 mL Oral QID  . pantoprazole (PROTONIX) IV  40 mg Intravenous Q24H  . sodium chloride flush  10-40 mL Intracatheter Q12H    Continuous Infusions: . dextrose 5% lactated ringers with KCl 20 mEq/L 25 mL/hr at 01/09/21 0227  . TPN ADULT (ION) 100 mL/hr at 01/09/21 0140  . TPN ADULT (ION)      PRN Meds: acetaminophen **OR** acetaminophen, alum & mag hydroxide-simeth, bisacodyl, ketorolac, labetalol, morphine injection, [DISCONTINUED] ondansetron **OR** ondansetron (ZOFRAN) IV, phenol, prochlorperazine, sodium chloride flush  Physical Exam          Awake alert Mild generalized distress Has NGT Is on TPN Has edema LUE and in LE Appears chronically ill Abdomen mildly distended   Vital Signs: BP (!) 147/69 (BP Location: Left Arm)   Pulse 96   Temp 98 F (36.7 C) (Oral)   Resp 18   Wt 102.4 kg   SpO2 100%   BMI 42.66 kg/m  SpO2: SpO2: 100 % O2 Device: O2 Device: Room Air O2 Flow Rate:    Intake/output summary:  Intake/Output Summary (Last 24 hours) at  01/09/2021 1313 Last data filed at 01/09/2021 0545 Gross per 24 hour  Intake 1457.86 ml  Output 1600 ml  Net -142.14 ml   LBM: Last BM Date: 12/22/20 Baseline Weight: Weight: 80.7 kg Most recent weight: Weight: 102.4 kg       Palliative Assessment/Data:      Patient Active Problem List   Diagnosis Date Noted  . Ileus ([place]) 01/02/2021  . Acute cystitis with hematuria 01/02/2021  . Obstructive uropathy 01/02/2021  . GERD (gastroesophageal reflux disease) 12/29/2020  . Loose stools 09/22/2020  . Generalized weakness 09/22/2020  . Poor dentition 08/16/2020  . 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 Pancytopenia, acquired ([place]) 07/08/2020  . Hypomagnesemia 07/08/2020  . Hypokalemia 07/08/2020  . Weight loss 06/24/2020  . Nausea with vomiting 06/07/2020  . Metastasis to lymph nodes ([place]) 05/31/2020  . Metastasis to lung ([place]) 05/31/2020  . Goals of care, counseling/discussion 05/31/2020  . Other constipation 05/20/2020  . Deficiency anemia 05/12/2020  . Cancer associated pain 05/12/2020  . Diabetes mellitus ([place]) 05/12/2020  . Endometrial carcinoma ([place]) 04/25/2020  . Dysuria 04/20/2020  . Postmenopausal bleeding 04/19/2020  . Pap smear for cervical cancer screening 04/19/2020  . Screening mammogram for breast cancer 04/19/2020  . Elevated blood pressure reading 04/19/2020  . Essential hypertension 04/19/2020  . Right renal mass 02/26/2020  . Kidney stone 02/26/2020  . Hematuria 02/26/2020    Palliative Care Assessment & Plan    Patient Profile: 65 year old lady with endometrial cancer diabetes hypertension recent COVID-19 infection in July, admitted with vomiting abdominal pain.  CT scan showing ileus possible evolving bowel obstruction, progression of ascites and omental caking with worsening bilateral hydronephrosis with obstructive uropathy.  Admitted to hospital medicine service, being followed by medical oncology and started on carboplatin Taxol and Herceptin.  Remains on NG tube and TPN.  Palliative services following for ongoing goals of care discussions.  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  Recommendations/Plan:  Full code full scope care for now, patient on TPN and chemotherapy, has NGT.  Advance care planning documents on chart Daughter is HCPOA Recommend PT Palliative care to follow.   Goals of Care and Additional Recommendations: Limitations on Scope of Treatment: Full Scope Treatment  Code Status:    Code Status Orders  (From admission, onward)           Start     Ordered   01/02/21 1225  Full code  Continuous        01/02/21 1228           Code Status History     This patient has a current code status but no historical code status.       Prognosis:  Unable to determine  Discharge Planning: To Be Determined  Care plan was discussed with  patient.    Thank you for allowing the Palliative Medicine Team to assist in the care of this patient.   Time In: 12 Time Out: 12.25 Total Time 25 Prolonged Time Billed No       Greater than 50%  of this time was spent counseling and coordinating care related to the above assessment and plan.  Loistine Chance, MD  Please contact Palliative Medicine Team phone at [phone] for  questions and concerns.

## 2021-01-09 NOTE — Progress Notes (Signed)
PHARMACY - TOTAL PARENTERAL NUTRITION CONSULT NOTE   Indication: Prolonged ileus  Patient Measurements: Weight: 102.4 kg (225 lb 12 oz)   Body mass index is 42.66 kg/m. Usual Weight:   Assessment: Patient is a 65 y.o F with endometrial cancer presented to the ED on 01/02/21 with c/o abdominal pain and n/v.  Abd CT on 8/29 showed findings consistent with ileus and disease progress. Plan is to start chemotherapy with this admission and to start TPN on 9/2 for ileus.  Glucose / Insulin: not on insulin - cbgs (goal <150): range 108 - 168; CL and CO2 wnl - 3 units of insulin given in past 24 hours  Electrolytes: Electrolytes WNL, corrCa 10.5,   - goal K >4, mag >2 for ileus Renal: scr <1  Hepatic: LFT WNL, TBili wnl. Triglycerides 108 ( 9/3), 156 ( 9/5)  Intake / Output; MIVF:  - D5 LR with 20 meq KCL @ 25  ml/hr - I/O: -620.6 ml GI Imaging: - 8/29 abd CT: Interval progression of ascites with progression of omental soft tissue nodularity now demonstrating areas of confluent caking. Small bowel is mildly distended and fluid-filled up to 3 cm diameter and diffusely fluid-filled. Imaging features are compatible with ileus. Interval worsening of mild right and moderate left hydronephrosis with decreased perfusion to the left kidney compatible with obstructive uropathy GI Surgeries / Procedures:  - has NG tube  Central access: port-a-cath TPN start date: 01/06/21  Nutritional Goals: Goal TPN rate is  100 mL/hr (provides 120 g of protein and 2352 kcals per day)  RD Assessment (01/06/21): Kcal:  [id number] kcal Protein:  115-130 gm Fluid:   > 2.2 L/day  Current Nutrition:  Clear liquids  Plan:     At 18:00  Continue  TPN to 100  mL/hr at 1800, the goal rate  Electrolytes in TPN: Na 60mq/L, K  35 mEq/L, Ca 551m/L, Mg 8 mEq/L, and Phos 1548m/L. Cl:Ac 1:1 Add standard MVI and trace elements to TPN Continue  Sensitive q6h SSI and adjust as needed  MIVF to  25 mL/hr at 1800 BMP,  magnesium, phosphorus with AM labs  Monitor TPN labs on Mon/Thurs    NikRoyetta AsalharmD, BCPS 01/09/2021 7:23 AM

## 2021-01-09 NOTE — Progress Notes (Signed)
PROGRESS NOTE  Amanda Lin [id number] DOB: August 05, 1955   PCP: Nicolette Bang, MD  Patient is from: Home  DOA: 01/02/2021 LOS: 7  Chief complaints:  Chief Complaint  Patient presents with   Abdominal Pain   Constipation     Brief Narrative / Interim history: 65 year old F with PMH of endometrial cancer, DM-2, HTN and COVID-19 infection in 11/2020 presenting with abdominal pain and emesis for about 3 weeks.  CT abdomen and pelvis showed ileus with possible evolving bowel obstruction, progression of ascites and omental soft tissue nodularity with caking, worsening bilateral hydronephrosis with obstructive uropathy and other findings as noted on CT abdomen and pelvis.  Patient was started on NG tube which was discontinued on 8/31.  She had emesis on 9/1.  KUB with increased small bowel distention with multiple fluid levels and paucity of distal gas concerning for bowel obstruction.  NG tube replaced on 9/1.  TPN initiated on 9/2.  Oncology consulted and started palliative chemotherapy on 9/2.  Subjective: Seen and examined earlier this morning.  No major events overnight of this morning.  No complaints other than sore throat.  She has been using Chloraseptic spray.  She denies chest pain, shortness of breath, nausea, vomiting and abdominal pain.  No BM or flatus yet.  Objective: Vitals:   01/08/21 1808 01/08/21 2122 01/09/21 0542 01/09/21 1452  BP: (!) 179/77 (!) 192/91 (!) 147/69 (!) 149/66  Pulse: 64 85 96 72  Resp: '16 16 18 16  '$ Temp: 98 F (36.7 C) 98.2 F (36.8 C) 98 F (36.7 C) 97.7 F (36.5 C)  TempSrc: Oral Oral Oral Oral  SpO2: 97% 99% 100% 97%  Weight:        Intake/Output Summary (Last 24 hours) at 01/09/2021 1600 Last data filed at 01/09/2021 0545 Gross per 24 hour  Intake 1457.86 ml  Output 1600 ml  Net -142.14 ml   Filed Weights   01/06/21 0903 01/07/21 0432 01/08/21 0557  Weight: 82 kg 82.1 kg 102.4 kg    Examination:  GENERAL: No  apparent distress.  Nontoxic. HEENT: MMM.  No oropharyngeal lesion.  Vision and hearing grossly intact.  NECK: Supple.  No apparent JVD.  RESP:  No IWOB.  Fair aeration bilaterally. CVS:  RRR. Heart sounds normal.  ABD/GI/GU: BS+. Abd soft, NTND.  MSK/EXT:  Moves extremities. No apparent deformity.  BLE edema.  LUE swelling improved. SKIN: no apparent skin lesion or wound NEURO: Awake and alert. Oriented appropriately.  No apparent focal neuro deficit. PSYCH: Calm. Normal affect.   Procedures:  Nasogastric tube 8/28-8/31  Microbiology summarized: Blood cultures NGTD. Urine culture with insignificant growth.  Assessment & Plan: Possible small bowel obstruction-noted on CT abdomen and pelvis on 8/29.  Repeat abdominal x-ray on 8/30 with normal bowel gas pattern.  NG tube removed on 8/31.  Then she had emesis on 9/1.  Repeat KUB with increased small bowel distention, multiple fluid levels and paucity of distal gas concerning for obstruction. -NGT replaced on 9/1. -Remains n.p.o. except sips and ice chips. -Mobilize in the hall every 4 hours while awake. -On IV fluid and TPN 9/2>> -Continue IV PPI -Low-dose Toradol and IV morphine as needed for pain   Acute UTI: Heart dysuria.  UA with pyuria.  Urine culture with insignificant growth. -Completed 3 doses of IV ceftriaxone empirically  Progressive endometrial carcinoma -Appreciate oncology-started palliative chemotherapy on 9/2. -Appreciate help by palliative care as well  Bilateral hydronephrosis left greater than right -Appreciate urology-supportive care as  long as renal function remained stable.   Essential hypertension: SBP elevated but improving. -Clonidine patch while NPO -As needed labetalol with parameters  CKD-3A/azotemia Recent Labs    12/27/20 1139 12/30/20 1407 01/02/21 0931 01/03/21 0440 01/04/21 0543 01/05/21 0516 01/06/21 0458 01/07/21 0647 01/08/21 0756 01/09/21 0429  BUN 30* 27* 34* 31* 26* '20 17 15  '$ 25* 33*  CREATININE 1.45* 1.44* 1.30* 1.15* 1.05* 0.85 0.98 0.97 0.99 0.85  -Continue monitoring  Hypokalemia/hypomagnesemia: Resolved.  Sore throat: Likely from NG tube.  No oropharyngeal lesion on exam -Continue Chloraseptic spray -Added Magic mouthwash with lidocaine  Goal of care: grim prognosis with advanced endometrial carcinoma.  Currently on palliative chemo -Appreciate help by palliative care and oncology  Class I obesity/severe protein calorie malnutrition Body mass index is 42.66 kg/m. Nutrition Problem: Inadequate oral intake Etiology: inability to eat Signs/Symptoms: NPO status Interventions: TPN   DVT prophylaxis:  enoxaparin (LOVENOX) injection 40 mg Start: 01/02/21 1600 SCDs Start: 01/02/21 1225  Code Status: Full code Family Communication: Patient and Therapist, sports.  None at bedside. Level of care: Med-Surg Status is: Inpatient  Remains inpatient appropriate because:Ongoing diagnostic testing needed not appropriate for outpatient work up, IV treatments appropriate due to intensity of illness or inability to take PO, and Inpatient level of care appropriate due to severity of illness  Dispo: The patient is from: Home              Anticipated d/c is to: Home              Patient currently is not medically stable to d/c.   Difficult to place patient No       Consultants:  Oncology Urology Palliative medicine   Sch Meds:  Scheduled Meds:  acetaminophen  650 mg Oral Once   Chlorhexidine Gluconate Cloth  6 each Topical Daily   cloNIDine  0.1 mg Transdermal Weekly   enoxaparin (LOVENOX) injection  40 mg Subcutaneous Q24H   insulin aspart  0-9 Units Subcutaneous Q6H   magic mouthwash w/lidocaine  5 mL Oral QID   pantoprazole (PROTONIX) IV  40 mg Intravenous Q24H   sodium chloride flush  10-40 mL Intracatheter Q12H   Continuous Infusions:  dextrose 5% lactated ringers with KCl 20 mEq/L 25 mL/hr at 01/09/21 0227   TPN ADULT (ION) 100 mL/hr at 01/09/21 0140    TPN ADULT (ION)     PRN Meds:.acetaminophen **OR** acetaminophen, alum & mag hydroxide-simeth, bisacodyl, ketorolac, labetalol, morphine injection, [DISCONTINUED] ondansetron **OR** ondansetron (ZOFRAN) IV, phenol, prochlorperazine, sodium chloride flush  Antimicrobials: Anti-infectives (From admission, onward)    Start     Dose/Rate Route Frequency Ordered Stop   01/02/21 1400  cefTRIAXone (ROCEPHIN) 2 g in sodium chloride 0.9 % 100 mL IVPB        2 g 200 mL/hr over 30 Minutes Intravenous Every 24 hours 01/02/21 1256 01/04/21 1342   01/02/21 1300  cefTRIAXone (ROCEPHIN) 1 g in sodium chloride 0.9 % 100 mL IVPB  Status:  Discontinued        1 g 200 mL/hr over 30 Minutes Intravenous Every 12 hours 01/02/21 1248 01/02/21 1254        I have personally reviewed the following labs and images: CBC: Recent Labs  Lab 01/03/21 0440 01/05/21 0516 01/06/21 0458  WBC 5.7 9.0 8.6  NEUTROABS  --   --  6.2  HGB 11.0* 10.1* 10.6*  HCT 34.3* 32.1* 32.8*  MCV 90.5 90.4 89.9  PLT 308 273 235   BMP &  GFR Recent Labs  Lab 01/05/21 0516 01/06/21 0458 01/07/21 0647 01/08/21 0756 01/09/21 0429  NA 140 138 137 138 139  K 3.9 4.1 4.6 4.6 4.8  CL 114* 108 112* 111 110  CO2 '22 23 22 23 22  '$ GLUCOSE 92 123* 190* 138* 178*  BUN '20 17 15 '$ 25* 33*  CREATININE 0.85 0.98 0.97 0.99 0.85  CALCIUM 8.8* 8.7* 8.7* 8.8* 9.0  MG 1.7 1.9 1.8 2.2 2.2  PHOS 2.3* 2.5 3.0 2.9 3.2   Estimated Creatinine Clearance: 73.5 mL/min (by C-G formula based on SCr of 0.85 mg/dL). Liver & Pancreas: Recent Labs  Lab 01/05/21 0516 01/06/21 0458 01/07/21 0647 01/09/21 0429  AST  --  11* 15 14*  ALT  --  '9 10 11  '$ ALKPHOS  --  71 66 57  BILITOT  --  0.4 0.3 0.2*  PROT  --  5.4* 5.5* 5.1*  ALBUMIN 2.3* 2.3* 2.2* 2.1*   No results for input(s): LIPASE, AMYLASE in the last 168 hours.  No results for input(s): AMMONIA in the last 168 hours. Diabetic: No results for input(s): HGBA1C in the last 72 hours. Recent  Labs  Lab 01/08/21 1213 01/08/21 1802 01/09/21 0006 01/09/21 0543 01/09/21 1221  GLUCAP 133* 108* 148* 168* 148*   Cardiac Enzymes: No results for input(s): CKTOTAL, CKMB, CKMBINDEX, TROPONINI in the last 168 hours. No results for input(s): PROBNP in the last 8760 hours. Coagulation Profile: No results for input(s): INR, PROTIME in the last 168 hours. Thyroid Function Tests: No results for input(s): TSH, T4TOTAL, FREET4, T3FREE, THYROIDAB in the last 72 hours. Lipid Profile: Recent Labs    01/07/21 1216 01/09/21 0429  TRIG 108 156*   Anemia Panel: No results for input(s): VITAMINB12, FOLATE, FERRITIN, TIBC, IRON, RETICCTPCT in the last 72 hours. Urine analysis:    Component Value Date/Time   COLORURINE YELLOW 01/02/2021 0931   APPEARANCEUR CLOUDY (A) 01/02/2021 0931   LABSPEC 1.015 01/02/2021 0931   PHURINE 5.0 01/02/2021 0931   GLUCOSEU NEGATIVE 01/02/2021 0931   HGBUR MODERATE (A) 01/02/2021 0931   BILIRUBINUR NEGATIVE 01/02/2021 0931   BILIRUBINUR moderate (A) 02/23/2020 0946   KETONESUR 5 (A) 01/02/2021 0931   PROTEINUR 100 (A) 01/02/2021 0931   UROBILINOGEN 0.2 02/23/2020 0946   NITRITE NEGATIVE 01/02/2021 0931   LEUKOCYTESUR LARGE (A) 01/02/2021 0931   Sepsis Labs: Invalid input(s): PROCALCITONIN, [place]  Microbiology: Recent Results (from the past 240 hour(s))  Urine Culture     Status: Abnormal   Collection Time: 01/02/21 12:35 PM   Specimen: Urine, Clean Catch  Result Value Ref Range Status   Specimen Description   Final    URINE, CLEAN CATCH Performed at [facility], [place] [address] [place], [place] [address]    Special Requests   Final    NONE Performed at [facility], [place] [address] [place], [place] [address]    Culture (A)  Final    <10,000 COLONIES/mL INSIGNIFICANT GROWTH Performed at [place] [address], [place], [place] [address]    Report Status 01/04/2021 FINAL  Final   Culture, blood (routine x 2)     Status: None   Collection Time: 01/02/21  9:02 PM   Specimen: Left Antecubital; Blood  Result Value Ref Range Status   Specimen Description   Final    LEFT ANTECUBITAL Performed at [place] [address], [place], [place] [address]    Special Requests   Final  BOTTLES DRAWN AEROBIC ONLY Blood Culture results may not be optimal due to an inadequate volume of blood received in culture bottles Performed at [facility], [place] [address] [place], [place] [address]    Culture   Final    NO GROWTH 5 DAYS Performed at [facility], [place] [address] [place], [place] [address]    Report Status 01/08/2021 FINAL  Final    Radiology Studies: No results found.    Numan Zylstra T. [place]  If 7PM-7AM, please contact night-coverage [email or web address] 01/09/2021, 4:00 PM

## 2021-01-10 DIAGNOSIS — N3001 Acute cystitis with hematuria: Secondary | ICD-10-CM | POA: Diagnosis not present

## 2021-01-10 DIAGNOSIS — K56609 Unspecified intestinal obstruction, unspecified as to partial versus complete obstruction: Secondary | ICD-10-CM | POA: Diagnosis not present

## 2021-01-10 DIAGNOSIS — G893 Neoplasm related pain (acute) (chronic): Secondary | ICD-10-CM | POA: Diagnosis not present

## 2021-01-10 DIAGNOSIS — Z7189 Other specified counseling: Secondary | ICD-10-CM | POA: Diagnosis not present

## 2021-01-10 DIAGNOSIS — C541 Malignant neoplasm of endometrium: Secondary | ICD-10-CM | POA: Diagnosis not present

## 2021-01-10 DIAGNOSIS — E876 Hypokalemia: Secondary | ICD-10-CM | POA: Diagnosis not present

## 2021-01-10 LAB — BASIC METABOLIC PANEL
Anion gap: 6 (ref 5–15)
BUN: 41 mg/dL — ABNORMAL HIGH (ref 8–23)
CO2: 23 mmol/L (ref 22–32)
Calcium: 9 mg/dL (ref 8.9–10.3)
Chloride: 110 mmol/L (ref 98–111)
Creatinine, Ser: 0.87 mg/dL (ref 0.44–1.00)
GFR, Estimated: >60 mL/min (ref >60)
Glucose, Bld: 166 mg/dL — ABNORMAL HIGH (ref 70–99)
Potassium: 4.9 mmol/L (ref 3.5–5.1)
Sodium: 139 mmol/L (ref 135–145)

## 2021-01-10 LAB — GLUCOSE, CAPILLARY
Glucose-Capillary: 151 mg/dL — ABNORMAL HIGH (ref 70–99)
Glucose-Capillary: 160 mg/dL — ABNORMAL HIGH (ref 70–99)
Glucose-Capillary: 129 mg/dL — ABNORMAL HIGH (ref 70–99)
Glucose-Capillary: 122 mg/dL — ABNORMAL HIGH (ref 70–99)

## 2021-01-10 LAB — PHOSPHORUS: Phosphorus: 3.6 mg/dL (ref 2.5–4.6)

## 2021-01-10 LAB — MAGNESIUM: Magnesium: 2.2 mg/dL (ref 1.7–2.4)

## 2021-01-10 MED ORDER — TRACE MINERALS CU-MN-SE-ZN 300-55-60-3000 MCG/ML IV SOLN
INTRAVENOUS | Status: DC
  Filled 2021-01-10: qty 1200

## 2021-01-10 NOTE — Evaluation (Signed)
Occupational Therapy Evaluation Patient Details Name: Amanda Lin MRN: [id number] DOB: [birth year] Today's Date: 01/10/2021    History of Present Illness Patient is a 65 year old female who presented to the hospital on 8/29 with abdominal pain and vomitting. patient was found to have UTI, ileus and hydronephrosis. PMH: endometiral cancer, diabetes, HTN, COVID 19 infection 7/22   Clinical Impression   Patient is a 65 year old female who was admitted for above. Patient was living at home with MI for ADLs with daughter. Currently patient is TD for LB dressing tasks with mod A for bed mobility and max A for sit to stand. Patient was noted to have increased pain in LLE, decreased activity tolerance, decreased endurance, decreased standing balance impacting ability to engage in ADLs at prior level. Patient would continue to benefit from skilled OT services at this time while admitted and after d/c to address noted deficits in order to improve overall safety and independence in ADLs.      Follow Up Recommendations  Home health OT;Supervision/Assistance - 24 hour    Equipment Recommendations  3 in 1 bedside commode;Other (comment) (total hip kit)    Recommendations for Other Services       Precautions / Restrictions        Mobility Bed Mobility Overal bed mobility: Needs Assistance Bed Mobility: Supine to Sit;Rolling Rolling: Min assist   Supine to sit: Max assist;HOB elevated     General bed mobility comments: patient needed increased time and physical assistance of BLE to advance to edge of bed. LLE more than RLE.    Transfers Overall transfer level: Needs assistance Equipment used: Rolling walker (2 wheeled) Transfers: Sit to/from Stand Sit to Stand: Mod assist;From elevated surface         General transfer comment: patient required two attempts with education on shifting weight fowards v.s. leaning back    Balance Overall balance assessment: Needs  assistance Sitting-balance support: Feet supported Sitting balance-Leahy Scale: Fair     Standing balance support: Bilateral upper extremity supported Standing balance-Leahy Scale: Poor Standing balance comment: with rolling walker. patient was noted to have increased anxiousness with standing still.                           ADL either performed or assessed with clinical judgement   ADL Overall ADL's : Needs assistance/impaired Eating/Feeding: NPO Eating/Feeding Details (indicate cue type and reason): npo currently Grooming: Oral care;Wash/dry face;Bed level;Set up   Upper Body Bathing: Moderate assistance;Sitting   Lower Body Bathing: Sitting/lateral leans;Maximal assistance Lower Body Bathing Details (indicate cue type and reason): increased edema and pain in BLE impacting ability to participate in ADLs Upper Body Dressing : Minimal assistance;Sitting   Lower Body Dressing: Maximal assistance;Bed level Lower Body Dressing Details (indicate cue type and reason): patient was unable to assist with gripper socks with increased edema and pain with flexion of bilateral knees   Toilet Transfer Details (indicate cue type and reason): deferred on this date. Toileting- Clothing Manipulation and Hygiene: Total assistance;Sit to/from stand Toileting - Clothing Manipulation Details (indicate cue type and reason): patient was anxious with idea of taking hand off walker on this date.       General ADL Comments: patient was educated on total hip kit beng good idea. daughter to look into it. patient was able to stand for over 1 min with min A with eduaction on deep breathing and relaxing. daughter participated in encoueraging patient  to deep breath and calm while working hard.     Vision Baseline Vision/History: 1 Wears glasses Patient Visual Report: No change from baseline       Perception     Praxis      Pertinent Vitals/Pain Pain Assessment: Faces Faces Pain Scale: Hurts  even more Pain Location: LLE Pain Descriptors / Indicators: Constant;Guarding;Grimacing Pain Intervention(s): Monitored during session;Limited activity within patient's tolerance     Hand Dominance     Extremity/Trunk Assessment Upper Extremity Assessment Upper Extremity Assessment: Overall WFL for tasks assessed   Lower Extremity Assessment Lower Extremity Assessment: Defer to PT evaluation   Cervical / Trunk Assessment Cervical / Trunk Assessment: Kyphotic   Communication Communication Communication: No difficulties   Cognition Arousal/Alertness: Awake/alert Behavior During Therapy: WFL for tasks assessed/performed Overall Cognitive Status: Within Functional Limits for tasks assessed                                     General Comments       Exercises     Shoulder Instructions      Home Living Family/patient expects to be discharged to:: Private residence Living Arrangements: Children Available Help at Discharge: Family;Available 24 hours/day (daughter works from home) Type of Home: House Home Access: Stairs to enter [employer] of Steps: about 20 steps per daughter report   Home Layout: Two level;Able to live on main level with bedroom/bathroom         Bathroom Toilet: Standard     Home Equipment: Kasandra Knudsen - single point   Additional Comments: plan is for patient to go back home with daughter. some conflicting reports of home set up with mentions of some of patients house as well during session.      Prior Functioning/Environment Level of Independence: Independent        Comments: patient was able to get to and from bathroom with no AE. patients daughter would encourage use of cane with longer distances. patient was able to complete ADls MI at daughters house.        OT Problem List: Decreased strength;Decreased knowledge of use of DME or AE;Increased edema;Decreased activity tolerance;Impaired balance (sitting and/or  standing);Decreased safety awareness;Cardiopulmonary status limiting activity      OT Treatment/Interventions: Self-care/ADL training;Energy conservation;Therapeutic exercise;Therapeutic activities;Patient/family education;Cognitive remediation/compensation;Balance training;DME and/or AE instruction    OT Goals(Current goals can be found in the care plan section) Acute Rehab OT Goals Patient Stated Goal: to walk with less assistance OT Goal Formulation: With patient/family Time For Goal Achievement: 01/24/21 Potential to Achieve Goals: Good  OT Frequency: Min 2X/week   Barriers to D/C: Decreased caregiver support          Co-evaluation              AM-PAC OT "6 Clicks" Daily Activity     Outcome Measure Help from another person eating meals?: Total Help from another person taking care of personal grooming?: A Little Help from another person toileting, which includes using toliet, bedpan, or urinal?: Total Help from another person bathing (including washing, rinsing, drying)?: A Lot Help from another person to put on and taking off regular upper body clothing?: A Little Help from another person to put on and taking off regular lower body clothing?: A Lot 6 Click Score: 12   End of Session Equipment Utilized During Treatment: Gait belt;Rolling walker  Activity Tolerance: Patient tolerated treatment well Patient left: in  bed;with call bell/phone within reach;with family/visitor present  OT Visit Diagnosis: Unsteadiness on feet (R26.81);Pain;Muscle weakness (generalized) (M62.81) Pain - Right/Left: Left Pain - part of body: Leg                Time: 4142-3953 OT Time Calculation (min): 35 min Charges:  OT General Charges $OT Visit: 1 Visit OT Evaluation $OT Eval Moderate Complexity: 1 Mod OT Treatments $Self Care/Home Management : 8-22 mins  Jackelyn Poling OTR/L, MS Acute Rehabilitation Department Office# [phone] Pager# [phone]   [place] 01/10/2021, 10:40 AM

## 2021-01-10 NOTE — Progress Notes (Addendum)
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 She had NG tube replaced due to persistent signs and symptoms of carcinomatosis secondary to recurrent endometrial cancer She has completed 3 days course of IV antibiotics for UTI She had received carboplatin & Taxol & Herceptin last week on January 06, 2021 I do not anticipate clinical benefit for 5 to 7 days Recommend continue supportive care for the next 2 weeks If she has no improvement in the next 2 weeks, then we will be transitioning her care to comfort measures  Cancer associated pain She has multifactorial pain I recommend pain medicine as needed   Nausea with vomiting/ileus secondary to abdominal carcinomatosis She continues to intermittent nausea Recommend IV fluids and antiemetics as needed Do not remove NG tube until later this week I have reviewed her x-ray from January 08, 2021, not worse I recommend a trial of clamping NG tube today  Physical deconditioning She has generalized weakness and is inclined not to do therapy because of pain and weakness I encouraged the patient to continue to participate with physical therapy while on treatment  Severe protein calorie malnutrition Continue TPN  Hydronephrosis Monitor renal function closely I spoke with urologist We can technically monitor this closely for now   UTI, adequately treated    Goals of care Resolution of ileus, nausea vomiting, and UTI I reviewed palliative intent of treatment with the patient and her daughter and they are willing to try She is aware, if despite chemotherapy, that if she did not improve within 2 to 3 weeks time, her condition will be considered terminal at that point in time   Discharge planning She will likely be here for 5 to 7 days I will return to check on her  tomorrow  [name], NP 01/10/2021 8:26 AM  [name], MD  Subjective:  NG tube remains in place Denies abdominal pain, nausea, vomiting States that her bowels are not moving and she is not passing any flatus She has lower extremity edema and left upper extremity edema She was unable to ambulate yesterday but was able to sit up in the recliner Her daughter is by the bedside who also encouraged her  Objective:  Vitals:   01/09/21 2101 01/10/21 0514  BP: (!) 148/68 (!) 136/52  Pulse: 93 82  Resp: 14 15  Temp: 98 F (36.7 C) 97.8 F (36.6 C)  SpO2: 100% 96%     Intake/Output Summary (Last 24 hours) at 01/10/2021 [id number] Last data filed at 01/10/2021 0615 Gross per 24 hour  Intake 1433.41 ml  Output 1500 ml  Net -66.59 ml    GENERAL:alert, no distress and comfortable, NG tube in situ SKIN: skin color, texture, turgor are normal, no rashes or significant lesions EYES: normal, Conjunctiva are pink and non-injected, sclera clear OROPHARYNX:no exudate, no erythema and lips, buccal mucosa, and tongue normal  NECK: supple, thyroid normal size, non-tender, without nodularity LYMPH:  no palpable lymphadenopathy in the cervical, axillary or inguinal LUNGS: clear to auscultation and percussion with normal breathing effort HEART: regular rate & rhythm and no murmurs and pitting edema in the bilateral lower extremity ABDOMEN:abdomen soft, non-tender, reduced bowel sounds Musculoskeletal:no cyanosis of digits and no clubbing  NEURO: alert & oriented x 3 with fluent speech, no focal motor/sensory deficits   Labs:  Recent Labs  02/23/20 1113 02/26/20 1007 01/06/21 0458 01/07/21 0647 01/08/21 0756 01/09/21 0429 01/10/21 0454  NA 138   < > 138 137 138 139 139  K 4.2   < > 4.1 4.6 4.6 4.8 4.9  CL 102   < > 108 112* 111 110 110  CO2 19*   < > '23 22 23 22 23  '$ GLUCOSE 138*   < > 123* 190* 138* 178* 166*  BUN 15   < > 17 15 25* 33* 41*  CREATININE 0.80   < > 0.98 0.97 0.99 0.85  0.87  CALCIUM 9.6   < > 8.7* 8.7* 8.8* 9.0 9.0  GFRNONAA 79   < > >60 >60 >60 >60 >60  GFRAA 91  --   --   --   --   --   --   PROT 7.8   < > 5.4* 5.5*  --  5.1*  --   ALBUMIN 4.4   < > 2.3* 2.2*  --  2.1*  --   AST 14   < > 11* 15  --  14*  --   ALT 19   < > 9 10  --  11  --   ALKPHOS 115   < > 71 66  --  57  --   BILITOT 0.6   < > 0.4 0.3  --  0.2*  --    < > = values in this interval not displayed.    Studies:  DG Abd 1 View  Result Date: 01/03/2021 CLINICAL DATA:  Ileus EXAM: ABDOMEN - 1 VIEW COMPARISON:  Portable exam 0848 hours compared to 01/02/2021 FINDINGS: Tip of nasogastric tube projects over stomach. Excreted contrast material within urinary bladder with faint contrast bilaterally in dilated renal collecting systems. Single nonspecific air-filled nondistended small bowel loops in mid abdomen. Bowel gas pattern otherwise normal. Osseous structures unremarkable. Surgical clips RIGHT upper quadrant likely reflect prior cholecystectomy. IMPRESSION: BILATERAL hydronephrosis. Normal bowel gas pattern. Electronically Signed   By: Lavonia Dana M.D.   On: 01/03/2021 10:44   CT ABDOMEN PELVIS W CONTRAST  Result Date: 01/02/2021 CLINICAL DATA:  Endometrial cancer.  Bowel obstruction suspected. EXAM: CT ABDOMEN AND PELVIS WITH CONTRAST TECHNIQUE: Multidetector CT imaging of the abdomen and pelvis was performed using the standard protocol following bolus administration of intravenous contrast. CONTRAST:  42m OMNIPAQUE IOHEXOL 350 MG/ML SOLN COMPARISON:  11/02/2020 FINDINGS: Lower chest: Basilar atelectasis. Hepatobiliary: No suspicious focal abnormality within the liver parenchyma. Gallbladder is surgically absent. No intrahepatic or extrahepatic biliary dilation. Pancreas: No focal mass lesion. No dilatation of the main duct. No intraparenchymal cyst. No peripancreatic edema. Spleen: No splenomegaly. No focal mass lesion. Adrenals/Urinary Tract: No adrenal nodule or mass. Mild right and moderate  left hydronephrosis evident with decreased perfusion to the left kidney. 1.3 cm lesion lower pole right kidney, previously characterized as hemorrhagic cyst. More subtle enhancing lesion identified anterior lower pole right kidney, previously characterized as suspicious for neoplasm on CT without and with contrast dated 03/28/2020. Both ureters are dilated down to the level of the pelvic sidewall. Bladder is unremarkable. Stomach/Bowel: Stomach is unremarkable. No gastric wall thickening. No evidence of outlet obstruction. Duodenum is normally positioned as is the ligament of Treitz. No small bowel wall thickening. Small bowel is mildly distended up to 3 cm diameter and diffusely fluid-filled. The terminal ileum is normal. The appendix is normal. No gross colonic mass. No colonic wall thickening. Vascular/Lymphatic: There is mild atherosclerotic calcification of the abdominal  aorta without aneurysm. Small lymph nodes are seen in the gastrohepatic and hepatoduodenal ligament. Small lymph nodes evident in the retroperitoneal space of the abdomen along the pelvic sidewall bilaterally. Reproductive: Calcified fibroid noted in the uterus. Endometrium not well visualized. There is no adnexal mass. Other: Interval progression ascites with progression of omental soft tissue nodularity now demonstrating areas of confluent "caking" (see midline image 46/2). Enhancing peritoneal nodules are seen adjacent to the inferior liver (35/2) and in the right pelvis (63/2). There are enhancing mesenteric nodules (see central mesentery on 52/2) with soft tissue nodularity in the region of the umbilicus measuring 2.3 x 2.2 cm on image 54/2. Moderate volume ascites seen around the liver and spleen and along the stomach with moderate volume ascites in the pelvis is well. Musculoskeletal: No worrisome lytic or sclerotic osseous abnormality. IMPRESSION: 1. Interval progression of ascites with progression of omental soft tissue nodularity now  demonstrating areas of confluent caking. Peritoneal nodularity is also progressive in the interval. 2. Small bowel is mildly distended and fluid-filled up to 3 cm diameter and diffusely fluid-filled. Imaging features are compatible with ileus. Evolving obstruction not entirely excluded. 3. Interval worsening of mild right and moderate left hydronephrosis with decreased perfusion to the left kidney compatible with obstructive uropathy. Both ureters are obstructed at the level of the pelvic sidewall. 4. Subtle enhancing lesion anterior lower pole right kidney, previously characterized as neoplasm on CT without and with contrast dated 03/28/2020. 5. Small lymph nodes in the gastrohepatic and hepatoduodenal ligament, in the retroperitoneal space of the abdomen, and along the pelvic sidewall bilaterally. 6. Calcified fibroid in the uterus. Endometrium not well visualized. 7. Aortic Atherosclerosis (ICD10-I70.0). Electronically Signed   By: Misty Stanley M.D.   On: 01/02/2021 11:56   DG Abd 2 Views  Result Date: 01/05/2021 CLINICAL DATA:  Abdomen pain and emesis EXAM: ABDOMEN - 2 VIEW COMPARISON:  01/03/2021, CT 01/02/2021 FINDINGS: Trace pleural effusion left lung base. No free air beneath the diaphragm. Increased small bowel distension, measuring up to 4.1 cm with multiple fluid levels. Paucity of distal gas. IMPRESSION: 1. Increased small bowel distension with multiple fluid levels and paucity of distal gas, suspect for bowel obstruction Electronically Signed   By: Donavan Foil M.D.   On: 01/05/2021 16:34   DG Abd 2 Views  Result Date: 12/22/2020 CLINICAL DATA:  Right lower quadrant abdominal pain with nausea, vomiting, and constipation. History of endometrial cancer. EXAM: ABDOMEN - 2 VIEW COMPARISON:  None. FINDINGS: The bowel gas pattern is normal. There is no evidence of free air. No radio-opaque calculi or other significant radiographic abnormality is seen. Surgical clips in the right upper quadrant and  pelvis. No acute osseous abnormality. IMPRESSION: Negative. Electronically Signed   By: Titus Dubin M.D.   On: 12/22/2020 12:03   DG Abd Portable 1V  Result Date: 01/08/2021 CLINICAL DATA:  Nasogastric tube adjustment EXAM: PORTABLE ABDOMEN - 1 VIEW COMPARISON:  01/05/2021 FINDINGS: Nasogastric tube placement to the decompressed stomach. A few gas-filled nondilated small bowel loops in the mid abdomen. Colon decompressed. Cholecystectomy clips. IMPRESSION: Nasogastric tube to decompressed stomach. Electronically Signed   By: Lucrezia Europe M.D.   On: 01/08/2021 15:30   DG Abd Portable 1 View  Result Date: 01/02/2021 CLINICAL DATA:  Check gastric catheter placement EXAM: PORTABLE ABDOMEN - 1 VIEW COMPARISON:  None. FINDINGS: Gastric catheter is noted extending into the stomach. The proximal side port lies just beyond the gastroesophageal junction and could be advanced a few  cm as necessary. Visualized lungs are hypoinflated but clear. Right chest wall port is noted. IMPRESSION: Gastric catheter in the stomach as described. Electronically Signed   By: Inez Catalina M.D.   On: 01/02/2021 14:01   [place] EKG SITE RITE  Result Date: 01/08/2021 If Site Rite image not attached, placement could not be confirmed due to current cardiac rhythm.

## 2021-01-10 NOTE — Evaluation (Addendum)
Physical Therapy Evaluation Patient Details Name: Amanda Lin MRN: [id number] DOB: 05/23/1955 Today's Date: 01/10/2021   History of Present Illness  65 y.o. female  who presents to the emergency department on 01/02/2021 with abdominal pain and vomiting. Dx of UTI, ileus, hydronephrosis. Pt with medical history significant for endometrial cancer (diagnosed January 2022), diabetes, hypertension, COVID infection early July 2022.  Clinical Impression  Pt admitted with above diagnosis. Pt ambulated 28' with RW, distance limited by fatigue and by B foot pain. She reports she will have 24* assist available from her daughter at home.  Pt currently with functional limitations due to the deficits listed below (see PT Problem List). Pt will benefit from skilled PT to increase their independence and safety with mobility to allow discharge to the venue listed below.       Follow Up Recommendations Home health PT; supervision for mobility    Equipment Recommendations  Other (comment) (rollator)    Recommendations for Other Services       Precautions / Restrictions Precautions Precautions: Fall Precaution Comments: central port, denies h/o falls in past 6 months Restrictions Weight Bearing Restrictions: No      Mobility  Bed Mobility Overal bed mobility: Needs Assistance Bed Mobility: Supine to Sit Rolling: Min assist   Supine to sit: HOB elevated;Mod assist     General bed mobility comments: patient needed increased time and physical assistance of BLE to advance to edge of bed. LLE more than RLE. And assist to raise trunk.    Transfers Overall transfer level: Needs assistance Equipment used: Rolling walker (2 wheeled) Transfers: Sit to/from Stand Sit to Stand: Mod assist;From elevated surface         General transfer comment: Increased time/effort. Mod A to power up. Sit to stand x 3 trials. VC for forward momentum.  Ambulation/Gait Ambulation/Gait assistance: Min  guard Gait Distance (Feet): 18 Feet Assistive device: Rolling walker (2 wheeled) Gait Pattern/deviations: Step-through pattern;Decreased stride length Gait velocity: decr   General Gait Details: steady with RW, distance limited by fatigue and B foot pain  Stairs            Wheelchair Mobility    Modified Rankin (Stroke Patients Only)       Balance Overall balance assessment: Needs assistance Sitting-balance support: Feet supported Sitting balance-Leahy Scale: Fair     Standing balance support: Bilateral upper extremity supported Standing balance-Leahy Scale: Poor Standing balance comment: with rolling walker. relies on BUE support                             Pertinent Vitals/Pain Pain Assessment: Faces Pain Score: 5  Faces Pain Scale: Hurts even more Pain Location: B feet at rest and in standing Pain Descriptors / Indicators: Constant;Guarding;Grimacing Pain Intervention(s): Limited activity within patient's tolerance;Monitored during session;Repositioned;Patient requesting pain meds-RN notified    Home Living Family/patient expects to be discharged to:: Private residence Living Arrangements: Children Available Help at Discharge: Family;Available 24 hours/day (daughter works from home) Type of Home: House Home Access: Level entry   [employer] of Steps: no stairs at [place]: Two level;Able to live on main level with bedroom/bathroom Home Equipment: Kasandra Knudsen - single point;Shower seat Additional Comments: plan is for patient to go back home with daughter    Prior Function Level of Independence: Needs assistance         Comments: patient was able to get to and from bathroom with no AE.  Used cane when going out. Daughter assisted pt in/out of shower     Hand Dominance        Extremity/Trunk Assessment   Upper Extremity Assessment Upper Extremity Assessment: Defer to OT evaluation    Lower Extremity  Assessment Lower Extremity Assessment: LLE deficits/detail LLE Deficits / Details: knee ext -4/5    Cervical / Trunk Assessment Cervical / Trunk Assessment: Kyphotic  Communication   Communication: No difficulties  Cognition Arousal/Alertness: Awake/alert Behavior During Therapy: WFL for tasks assessed/performed Overall Cognitive Status: Within Functional Limits for tasks assessed                                        General Comments      Exercises General Exercises - Lower Extremity Long Arc Quad: AROM;Both;10 reps;Seated   Assessment/Plan    PT Assessment Patient needs continued PT services  PT Problem List Decreased mobility;Decreased balance;Decreased activity tolerance;Pain       PT Treatment Interventions      PT Goals (Current goals can be found in the Care Plan section)  Acute Rehab PT Goals Patient Stated Goal: increase endurance PT Goal Formulation: With patient Time For Goal Achievement: 01/24/21 Potential to Achieve Goals: Good    Frequency Min 3X/week   Barriers to discharge        Co-evaluation               AM-PAC PT "6 Clicks" Mobility  Outcome Measure Help needed turning from your back to your side while in a flat bed without using bedrails?: A Little Help needed moving from lying on your back to sitting on the side of a flat bed without using bedrails?: A Lot Help needed moving to and from a bed to a chair (including a wheelchair)?: A Little Help needed standing up from a chair using your arms (e.g., wheelchair or bedside chair)?: A Lot Help needed to walk in hospital room?: A Little Help needed climbing 3-5 steps with a railing? : A Lot 6 Click Score: 15    End of Session Equipment Utilized During Treatment: Gait belt Activity Tolerance: Patient limited by fatigue Patient left: in bed;with call bell/phone within reach;with family/visitor present Nurse Communication: Mobility status;Other (comment) (pt needs new  purewick) PT Visit Diagnosis: Difficulty in walking, not elsewhere classified (R26.2)    Time: [id number] PT Time Calculation (min) (ACUTE ONLY): 34 min   Charges:   PT Evaluation $PT Eval Moderate Complexity: 1 Mod PT Treatments $Therapeutic Activity: 8-22 mins        Blondell Reveal Kistler PT 01/10/2021  Acute Rehabilitation Services Pager [phone] Office [phone]

## 2021-01-10 NOTE — Progress Notes (Signed)
PHARMACY - TOTAL PARENTERAL NUTRITION CONSULT NOTE   Indication: Prolonged ileus  Patient Measurements: Weight: 106.9 kg (235 lb 10.8 oz)   Body mass index is 44.53 kg/m. Usual Weight:   Assessment: Patient is a 65 y.o F with endometrial cancer presented to the ED on 01/02/21 with c/o abdominal pain and n/v.  Abd CT on 8/29 showed findings consistent with ileus and disease progress. Plan is to start chemotherapy with this admission and to start TPN on 9/2 for ileus.  Glucose / Insulin: not on insulin - cbgs (goal <150): range 142-166 - 5 units of insulin given in past 24 hours  Electrolytes: Electrolytes WNL, corrCa 10.5,   - goal K >4, mag >2 for ileus Renal: scr <1  Hepatic: LFT WNL, TBili wnl. Triglycerides 108 ( 9/3), 156 ( 9/5)  Intake / Output; MIVF:  - D5 LR with 20 meq KCL @ 25  ml/hr - I/O: inaccurate d/t unmeasured urine occurrences GI Imaging: - 8/29 abd CT: Interval progression of ascites with progression of omental soft tissue nodularity now demonstrating areas of confluent caking. Small bowel is mildly distended and fluid-filled up to 3 cm diameter and diffusely fluid-filled. Imaging features are compatible with ileus. Interval worsening of mild right and moderate left hydronephrosis with decreased perfusion to the left kidney compatible with obstructive uropathy GI Surgeries / Procedures:  - has NG tube  Central access: port-a-cath TPN start date: 01/06/21  Nutritional Goals: Goal TPN rate is  100 mL/hr (provides 120 g of protein and 2352 kcals per day)  RD Assessment (01/06/21): Kcal:  [id number] kcal Protein:  115-130 gm Fluid:   > 2.2 L/day  Current Nutrition:  Clear liquids  Plan:  Continue TPN at goal rate of 100 ml/hr Electrolytes in TPN: Na 66mq/L, K  35 mEq/L, Ca 560m/L, Mg 8 mEq/L, and Phos 1572m/L. Cl:Ac 1:1 Add standard MVI and trace elements to TPN Continue  Sensitive q6h SSI and adjust as needed  Continue MIVF at  25 mL/hr BMP, magnesium,  phosphorus with AM labs  Monitor TPN labs on Mon/Thurs   ChrNapoleon Form/10/2020 7:06 AM

## 2021-01-10 NOTE — Progress Notes (Signed)
PROGRESS NOTE  Amanda Lin [id number] DOB: 1956-03-10   PCP: Nicolette Bang, MD  Patient is from: Home  DOA: 01/02/2021 LOS: 8  Chief complaints:  Chief Complaint  Patient presents with   Abdominal Pain   Constipation     Brief Narrative / Interim history: 65 year old F with PMH of endometrial cancer, DM-2, HTN and COVID-19 infection in 11/2020 presenting with abdominal pain and emesis for about 3 weeks.  CT abdomen and pelvis showed ileus with possible evolving bowel obstruction, progression of ascites and omental soft tissue nodularity with caking, worsening bilateral hydronephrosis with obstructive uropathy and other findings as noted on CT abdomen and pelvis.  Patient was started on NG tube which was discontinued on 8/31.  She had emesis on 9/1.  KUB with increased small bowel distention with multiple fluid levels and paucity of distal gas concerning for bowel obstruction.  NG tube replaced on 9/1.  TPN initiated on 9/2.   Oncology consulted and started palliative chemotherapy on 9/2.  Per oncology, plan to transition to comfort care if no improvement in the next 2 weeks  Subjective: Seen and examined earlier this morning.  No major events overnight or this morning.  She says she could not fall asleep last night.  She was uncomfortable in bed.  She denies nausea, vomiting or abdominal pain.  Sore throat improved with Magic mouthwash.  Objective: Vitals:   01/09/21 2101 01/10/21 0500 01/10/21 0514 01/10/21 1450  BP: (!) 148/68  (!) 136/52 (!) 150/73  Pulse: 93  82 90  Resp: '14  15 17  '$ Temp: 98 F (36.7 C)  97.8 F (36.6 C) 98 F (36.7 C)  TempSrc: Oral  Oral   SpO2: 100%  96% 100%  Weight:  106.9 kg      Intake/Output Summary (Last 24 hours) at 01/10/2021 1504 Last data filed at 01/10/2021 1200 Gross per 24 hour  Intake 1443.41 ml  Output 1950 ml  Net -506.59 ml   Filed Weights   01/07/21 0432 01/08/21 0557 01/10/21 0500  Weight: 82.1 kg 102.4 kg  106.9 kg    Examination:  GENERAL: No apparent distress.  Nontoxic. HEENT: MMM.  Vision and hearing grossly intact.  NGT to wall suction. NECK: Supple.  No apparent JVD.  RESP: On RA.  No IWOB.  Fair aeration bilaterally. CVS:  RRR. Heart sounds normal.  ABD/GI/GU: BS+. Abd soft, NTND.  MSK/EXT:  Moves extremities. No apparent deformity.  BLE edema. SKIN: Small blisters on LUE from IV fluid infiltration/swelling NEURO: Awake and alert. Oriented appropriately.  No apparent focal neuro deficit. PSYCH: Calm. Normal affect.   Procedures:  Nasogastric tube 8/28-8/31  Microbiology summarized: Blood cultures NGTD. Urine culture with insignificant growth.  Assessment & Plan: Possible small bowel obstruction-noted on CT abdomen and pelvis on 8/29.  Repeat abdominal x-ray on 8/30 with normal bowel gas pattern.  NG tube removed on 8/31.  Then she had emesis on 9/1.  Repeat KUB with increased small bowel distention, multiple fluid levels and paucity of distal gas concerning for obstruction. -NGT replaced on 9/1.  KUB on 9/4 better.  Clamping trial on 9/6.   -Oncology recommends keeping NGT until later this week.  May consider SBO protocol with p.o. contrast before removing NG tube. -Remains n.p.o. except sips and ice chips. -Mobilize in the hall every 4 hours while awake. -TPN 9/2>> -Continue IV PPI -Low-dose Toradol and IV morphine as needed for pain   Acute UTI: Heart dysuria.  UA with pyuria.  Urine culture with insignificant growth. -Completed 3 doses of IV ceftriaxone empirically  Progressive endometrial carcinoma -Appreciate oncology-started palliative chemotherapy on 9/2. -Appreciate help by palliative care as well -"Plan for comfort measures if no improvement in the next 2 weeks".  Bilateral hydronephrosis left greater than right -Appreciate urology-supportive care as long as renal function remained stable.   Essential hypertension: Normotensive. -Continue clonidine patch  while NPO -As needed labetalol with parameters  CKD-3A/azotemia Recent Labs    12/30/20 1407 01/02/21 0931 01/03/21 0440 01/04/21 0543 01/05/21 0516 01/06/21 0458 01/07/21 0647 01/08/21 0756 01/09/21 0429 01/10/21 0454  BUN 27* 34* 31* 26* '20 17 15 '$ 25* 33* 41*  CREATININE 1.44* 1.30* 1.15* 1.05* 0.85 0.98 0.97 0.99 0.85 0.87  -Continue monitoring  Hypokalemia/hypomagnesemia: Resolved.  Sore throat: Likely from NG tube.  No oropharyngeal lesion on exam.  Improved with Magic mouthwash. -Continue Chloraseptic spray and Magic mouthwash  Physical deconditioning: -PT/OT  BLE edema: Likely due to hypoalbuminemia and malignancy.  Anemia is not low enough to cause this degree of edema. -Check TSH -Leg elevation  Goal of care: grim prognosis with advanced endometrial carcinoma.  Currently on palliative chemo -Appreciate help by palliative care and oncology  Class I obesity/severe protein calorie malnutrition Body mass index is 44.53 kg/m. Nutrition Problem: Inadequate oral intake Etiology: inability to eat Signs/Symptoms: NPO status Interventions: TPN   DVT prophylaxis:  enoxaparin (LOVENOX) injection 40 mg Start: 01/02/21 1600 SCDs Start: 01/02/21 1225  Code Status: Full code Family Communication: Patient and Therapist, sports.  None at bedside. Level of care: Med-Surg Status is: Inpatient  Remains inpatient appropriate because:Ongoing diagnostic testing needed not appropriate for outpatient work up, IV treatments appropriate due to intensity of illness or inability to take PO, and Inpatient level of care appropriate due to severity of illness  Dispo: The patient is from: Home              Anticipated d/c is to: Home              Patient currently is not medically stable to d/c.   Difficult to place patient No       Consultants:  Urology-signed off Oncology Palliative medicine   Sch Meds:  Scheduled Meds:  acetaminophen  650 mg Oral Once   Chlorhexidine Gluconate  Cloth  6 each Topical Daily   cloNIDine  0.1 mg Transdermal Weekly   enoxaparin (LOVENOX) injection  40 mg Subcutaneous Q24H   insulin aspart  0-9 Units Subcutaneous Q6H   magic mouthwash w/lidocaine  5 mL Oral QID   pantoprazole (PROTONIX) IV  40 mg Intravenous Q24H   sodium chloride flush  10-40 mL Intracatheter Q12H   Continuous Infusions:  dextrose 5% lactated ringers with KCl 20 mEq/L 25 mL/hr at 01/10/21 0929   TPN ADULT (ION) 100 mL/hr at 01/09/21 1802   TPN ADULT (ION)     PRN Meds:.acetaminophen **OR** acetaminophen, alum & mag hydroxide-simeth, bisacodyl, ketorolac, labetalol, morphine injection, [DISCONTINUED] ondansetron **OR** ondansetron (ZOFRAN) IV, phenol, prochlorperazine, sodium chloride flush  Antimicrobials: Anti-infectives (From admission, onward)    Start     Dose/Rate Route Frequency Ordered Stop   01/02/21 1400  cefTRIAXone (ROCEPHIN) 2 g in sodium chloride 0.9 % 100 mL IVPB        2 g 200 mL/hr over 30 Minutes Intravenous Every 24 hours 01/02/21 1256 01/04/21 1342   01/02/21 1300  cefTRIAXone (ROCEPHIN) 1 g in sodium chloride 0.9 % 100 mL IVPB  Status:  Discontinued  1 g 200 mL/hr over 30 Minutes Intravenous Every 12 hours 01/02/21 1248 01/02/21 1254        I have personally reviewed the following labs and images: CBC: Recent Labs  Lab 01/05/21 0516 01/06/21 0458  WBC 9.0 8.6  NEUTROABS  --  6.2  HGB 10.1* 10.6*  HCT 32.1* 32.8*  MCV 90.4 89.9  PLT 273 235   BMP &GFR Recent Labs  Lab 01/06/21 0458 01/07/21 0647 01/08/21 0756 01/09/21 0429 01/10/21 0454  NA 138 137 138 139 139  K 4.1 4.6 4.6 4.8 4.9  CL 108 112* 111 110 110  CO2 '23 22 23 22 23  '$ GLUCOSE 123* 190* 138* 178* 166*  BUN 17 15 25* 33* 41*  CREATININE 0.98 0.97 0.99 0.85 0.87  CALCIUM 8.7* 8.7* 8.8* 9.0 9.0  MG 1.9 1.8 2.2 2.2 2.2  PHOS 2.5 3.0 2.9 3.2 3.6   Estimated Creatinine Clearance: 73.6 mL/min (by C-G formula based on SCr of 0.87 mg/dL). Liver &  Pancreas: Recent Labs  Lab 01/05/21 0516 01/06/21 0458 01/07/21 0647 01/09/21 0429  AST  --  11* 15 14*  ALT  --  '9 10 11  '$ ALKPHOS  --  71 66 57  BILITOT  --  0.4 0.3 0.2*  PROT  --  5.4* 5.5* 5.1*  ALBUMIN 2.3* 2.3* 2.2* 2.1*   No results for input(s): LIPASE, AMYLASE in the last 168 hours.  No results for input(s): AMMONIA in the last 168 hours. Diabetic: No results for input(s): HGBA1C in the last 72 hours. Recent Labs  Lab 01/09/21 1221 01/09/21 1731 01/09/21 2329 01/10/21 0516 01/10/21 1159  GLUCAP 148* 142* 142* 151* 160*   Cardiac Enzymes: No results for input(s): CKTOTAL, CKMB, CKMBINDEX, TROPONINI in the last 168 hours. No results for input(s): PROBNP in the last 8760 hours. Coagulation Profile: No results for input(s): INR, PROTIME in the last 168 hours. Thyroid Function Tests: No results for input(s): TSH, T4TOTAL, FREET4, T3FREE, THYROIDAB in the last 72 hours. Lipid Profile: Recent Labs    01/09/21 0429  TRIG 156*   Anemia Panel: No results for input(s): VITAMINB12, FOLATE, FERRITIN, TIBC, IRON, RETICCTPCT in the last 72 hours. Urine analysis:    Component Value Date/Time   COLORURINE YELLOW 01/02/2021 0931   APPEARANCEUR CLOUDY (A) 01/02/2021 0931   LABSPEC 1.015 01/02/2021 0931   PHURINE 5.0 01/02/2021 0931   GLUCOSEU NEGATIVE 01/02/2021 0931   HGBUR MODERATE (A) 01/02/2021 0931   BILIRUBINUR NEGATIVE 01/02/2021 0931   BILIRUBINUR moderate (A) 02/23/2020 0946   KETONESUR 5 (A) 01/02/2021 0931   PROTEINUR 100 (A) 01/02/2021 0931   UROBILINOGEN 0.2 02/23/2020 0946   NITRITE NEGATIVE 01/02/2021 0931   LEUKOCYTESUR LARGE (A) 01/02/2021 0931   Sepsis Labs: Invalid input(s): PROCALCITONIN, [place]  Microbiology: Recent Results (from the past 240 hour(s))  Urine Culture     Status: Abnormal   Collection Time: 01/02/21 12:35 PM   Specimen: Urine, Clean Catch  Result Value Ref Range Status   Specimen Description   Final    URINE, CLEAN  CATCH Performed at [facility], [place] [address] [place], [place] [address]    Special Requests   Final    NONE Performed at [facility], [place] [address], [place], [place] [address]    Culture (A)  Final    <10,000 COLONIES/mL INSIGNIFICANT GROWTH Performed at [facility] [address], [place], [place] [address]    Report Status 01/04/2021 FINAL  Final  Culture, blood (routine x 2)     Status: None   Collection Time: 01/02/21  9:02 PM   Specimen: Left Antecubital; Blood  Result Value Ref Range Status   Specimen Description   Final    LEFT ANTECUBITAL Performed at [place] [address] [place], [place] [address]    Special Requests   Final    BOTTLES DRAWN AEROBIC ONLY Blood Culture results may not be optimal due to an inadequate volume of blood received in culture bottles Performed at [place] [address] [place], [place] [address]    Culture   Final    NO GROWTH 5 DAYS Performed at [facility], [place] [address] [place], [place] [address]    Report Status 01/08/2021 FINAL  Final    Radiology Studies: No results found.    Kingslee Dowse T. [place]  If 7PM-7AM, please contact night-coverage [email or web address] 01/10/2021, 3:04 PM

## 2021-01-10 NOTE — Progress Notes (Signed)
Daily Progress Note   Patient Name: Amanda Lin       Date: 01/10/2021 DOB: [birth year]  Age: 65 y.o. MRN#: [id number] Attending Physician: Mercy Riding, MD Primary Care Physician: Nicolette Bang, MD Admit Date: 01/02/2021  Reason for Consultation/Follow-up: Establishing goals of care  Subjective: Awake and resting in bed.  Friend is at the bedside as well.    She reports feeling very swollen in her hands and feet (not a new problem) but otherwise states pain is controlled and denies needs.  Reviewed plan to see how she does over the 2 weeks while waiting to see how she responds to treatment she received of carboplatin/Taxol/Herceptin.  Length of Stay: 8  Current Medications: Scheduled Meds:  . acetaminophen  650 mg Oral Once  . Chlorhexidine Gluconate Cloth  6 each Topical Daily  . cloNIDine  0.1 mg Transdermal Weekly  . enoxaparin (LOVENOX) injection  40 mg Subcutaneous Q24H  . insulin aspart  0-9 Units Subcutaneous Q6H  . magic mouthwash w/lidocaine  5 mL Oral QID  . pantoprazole (PROTONIX) IV  40 mg Intravenous Q24H  . sodium chloride flush  10-40 mL Intracatheter Q12H    Continuous Infusions: . dextrose 5% lactated ringers with KCl 20 mEq/L 25 mL/hr at 01/10/21 2012  . TPN ADULT (ION) 100 mL/hr at 01/10/21 1745    PRN Meds: acetaminophen **OR** acetaminophen, alum & mag hydroxide-simeth, bisacodyl, ketorolac, labetalol, morphine injection, [DISCONTINUED] ondansetron **OR** ondansetron (ZOFRAN) IV, phenol, prochlorperazine, sodium chloride flush  Physical Exam         Awake alert Mild generalized distress Has NGT Is on TPN Has edema LUE and in LE Appears chronically ill Abdomen mildly distended   Vital Signs: BP (!) 150/73 (BP Location: Left Arm)    Pulse 90   Temp 98 F (36.7 C)   Resp 17   Wt 106.9 kg   SpO2 100%   BMI 44.53 kg/m  SpO2: SpO2: 100 % O2 Device: O2 Device: Room Air O2 Flow Rate:    Intake/output summary:  Intake/Output Summary (Last 24 hours) at 01/10/2021 2259 Last data filed at 01/10/2021 2012 Gross per 24 hour  Intake 3336.12 ml  Output 2150 ml  Net 1186.12 ml    LBM: Last BM Date: 12/22/20 Baseline Weight: Weight: 80.7 kg Most recent weight: Weight:  106.9 kg       Palliative Assessment/Data:      Patient Active Problem List   Diagnosis Date Noted  . Ileus ([place]) 01/02/2021  . Acute cystitis with hematuria 01/02/2021  . Obstructive uropathy 01/02/2021  . GERD (gastroesophageal reflux disease) 12/29/2020  . Loose stools 09/22/2020  . Generalized weakness 09/22/2020  . Poor dentition 08/16/2020  . 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 Pancytopenia, acquired ([place]) 07/08/2020  . Hypomagnesemia 07/08/2020  . Hypokalemia 07/08/2020  . Weight loss 06/24/2020  . Nausea with vomiting 06/07/2020  . Metastasis to lymph nodes ([place]) 05/31/2020  . Metastasis to lung ([place]) 05/31/2020  . Goals of care, counseling/discussion 05/31/2020  . Other constipation 05/20/2020  . Deficiency anemia 05/12/2020  . Cancer associated pain 05/12/2020  . Diabetes mellitus ([place]) 05/12/2020  . Endometrial carcinoma ([place]) 04/25/2020  . Dysuria 04/20/2020  . Postmenopausal bleeding 04/19/2020  . Pap smear for cervical cancer screening 04/19/2020  . Screening mammogram for breast cancer 04/19/2020  . Elevated blood pressure reading 04/19/2020  . Essential hypertension 04/19/2020  . Right renal mass 02/26/2020  . Kidney stone 02/26/2020  . Hematuria 02/26/2020    Palliative Care Assessment & Plan   Patient Profile: 65 year old lady with endometrial cancer diabetes hypertension recent COVID-19 infection in July, admitted with vomiting abdominal pain.  CT scan showing ileus possible evolving  bowel obstruction, progression of ascites and omental caking with worsening bilateral hydronephrosis with obstructive uropathy.  Admitted to hospital medicine service, being followed by medical oncology and started on carboplatin Taxol and Herceptin.  Remains on NG tube and TPN.  Palliative services following for ongoing goals of care discussions.  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  Recommendations/Plan:  Full code full scope care for now, patient on TPN and received chemotherapy on Sept. 2.  Will need to determine response over the next couple of weeks.  If she does not improve, plan is to transition to comfort. Advance care planning documents on chart Daughter is HCPOA Recommend PT Palliative care to follow peripherally.  Symptoms currently well managed.  Please call if there are palliative specific needs with which we can be of assistance.   Goals of Care and Additional Recommendations: Limitations on Scope of Treatment: Full Scope Treatment  Code Status:    Code Status Orders  (From admission, onward)           Start     Ordered   01/02/21 1225  Full code  Continuous        01/02/21 1228           Code Status History     This patient has a current code status but no historical code status.       Prognosis:  Unable to determine  Discharge Planning: To Be Determined  Care plan was discussed with  patient.    Thank you for allowing the Palliative Medicine Team to assist in the care of this patient.   Time In: 1210 Time Out: 1240 Total Time 30 Prolonged Time Billed No    Greater than 50%  of this time was spent counseling and coordinating care related to the above assessment and plan.   Micheline Rough,  MD  Please contact Palliative Medicine Team phone at [phone] for questions and concerns.

## 2021-01-11 ENCOUNTER — Inpatient Hospital Stay (HOSPITAL_COMMUNITY): Payer: 59

## 2021-01-11 DIAGNOSIS — Z7189 Other specified counseling: Secondary | ICD-10-CM

## 2021-01-11 DIAGNOSIS — K56609 Unspecified intestinal obstruction, unspecified as to partial versus complete obstruction: Secondary | ICD-10-CM

## 2021-01-11 DIAGNOSIS — G893 Neoplasm related pain (acute) (chronic): Secondary | ICD-10-CM | POA: Diagnosis not present

## 2021-01-11 DIAGNOSIS — Z515 Encounter for palliative care: Secondary | ICD-10-CM

## 2021-01-11 DIAGNOSIS — C541 Malignant neoplasm of endometrium: Secondary | ICD-10-CM | POA: Diagnosis not present

## 2021-01-11 LAB — PHOSPHORUS: Phosphorus: 3.8 mg/dL (ref 2.5–4.6)

## 2021-01-11 LAB — BASIC METABOLIC PANEL
Anion gap: 3 — ABNORMAL LOW (ref 5–15)
BUN: 43 mg/dL — ABNORMAL HIGH (ref 8–23)
CO2: 23 mmol/L (ref 22–32)
Calcium: 8.7 mg/dL — ABNORMAL LOW (ref 8.9–10.3)
Chloride: 109 mmol/L (ref 98–111)
Creatinine, Ser: 0.81 mg/dL (ref 0.44–1.00)
GFR, Estimated: >60 mL/min (ref >60)
Glucose, Bld: 146 mg/dL — ABNORMAL HIGH (ref 70–99)
Potassium: 4.8 mmol/L (ref 3.5–5.1)
Sodium: 135 mmol/L (ref 135–145)

## 2021-01-11 LAB — CBC WITH DIFFERENTIAL/PLATELET
Abs Immature Granulocytes: 0.03 10*3/uL (ref 0.00–0.07)
Basophils Absolute: 0 10*3/uL (ref 0.0–0.1)
Basophils Relative: 0 %
Eosinophils Absolute: 0 10*3/uL (ref 0.0–0.5)
Eosinophils Relative: 0 %
HCT: 28.3 % — ABNORMAL LOW (ref 36.0–46.0)
Hemoglobin: 8.8 g/dL — ABNORMAL LOW (ref 12.0–15.0)
Immature Granulocytes: 1 %
Lymphocytes Relative: 14 %
Lymphs Abs: 0.7 10*3/uL (ref 0.7–4.0)
MCH: 28.8 pg (ref 26.0–34.0)
MCHC: 31.1 g/dL (ref 30.0–36.0)
MCV: 92.5 fL (ref 80.0–100.0)
Monocytes Absolute: 0.1 10*3/uL (ref 0.1–1.0)
Monocytes Relative: 2 %
Neutro Abs: 4.3 10*3/uL (ref 1.7–7.7)
Neutrophils Relative %: 83 %
Platelets: 131 10*3/uL — ABNORMAL LOW (ref 150–400)
RBC: 3.06 MIL/uL — ABNORMAL LOW (ref 3.87–5.11)
RDW: 16.5 % — ABNORMAL HIGH (ref 11.5–15.5)
WBC: 5.1 10*3/uL (ref 4.0–10.5)
nRBC: 0 % (ref 0.0–0.2)

## 2021-01-11 LAB — GLUCOSE, CAPILLARY
Glucose-Capillary: 118 mg/dL — ABNORMAL HIGH (ref 70–99)
Glucose-Capillary: 126 mg/dL — ABNORMAL HIGH (ref 70–99)
Glucose-Capillary: 137 mg/dL — ABNORMAL HIGH (ref 70–99)
Glucose-Capillary: 138 mg/dL — ABNORMAL HIGH (ref 70–99)

## 2021-01-11 LAB — MAGNESIUM: Magnesium: 2.1 mg/dL (ref 1.7–2.4)

## 2021-01-11 MED ORDER — LORAZEPAM 1 MG PO TABS
1.0000 mg | ORAL_TABLET | Freq: Three times a day (TID) | ORAL | Status: DC | PRN
Start: 2021-01-11 — End: 2021-01-11
  Administered 2021-01-11: 1 mg via ORAL
  Filled 2021-01-11: qty 1

## 2021-01-11 MED ORDER — TRAVASOL 10 % IV SOLN: INTRAVENOUS | Status: AC

## 2021-01-11 MED ORDER — LORAZEPAM 0.5 MG PO TABS
0.5000 mg | ORAL_TABLET | Freq: Two times a day (BID) | ORAL | Status: DC | PRN
  Administered 2021-01-11: 0.5 mg via ORAL
  Filled 2021-01-11: qty 1

## 2021-01-11 MED ORDER — TRACE MINERALS CU-MN-SE-ZN 300-55-60-3000 MCG/ML IV SOLN
INTRAVENOUS | Status: AC
  Filled 2021-01-11: qty 1209.6

## 2021-01-11 NOTE — Progress Notes (Signed)
PHARMACY - TOTAL PARENTERAL NUTRITION CONSULT NOTE   Indication: Prolonged ileus  Patient Measurements: Weight: 107.7 kg (237 lb 7 oz)   Body mass index is 44.86 kg/m. Usual Weight:   Assessment: Patient is a 65 y.o F with endometrial cancer presented to the ED on 01/02/21 with c/o abdominal pain and n/v.  Abd CT on 8/29 showed findings consistent with ileus and disease progress. Plan is to start chemotherapy with this admission and to start TPN on 9/2 for ileus.  Glucose / Insulin: not on insulin - cbgs (goal <150): range 122-160 - 5 units of insulin given in past 24 hours  Electrolytes: Electrolytes WNL, corrCa 10.2,   - goal K >4, mag >2 for ileus Renal: scr <1  Hepatic: LFT WNL, TBili wnl. Triglycerides 108 ( 9/3), 156 ( 9/5)  Intake / Output; MIVF:  - D5 LR with 20 meq KCL @ 25  ml/hr - I/O: UOP consistent aroung 1100- 1300 ml/day. Net + 1.7L yesterday. Up 8 L this admission GI Imaging: - 8/29 abd CT: Interval progression of ascites with progression of omental soft tissue nodularity now demonstrating areas of confluent caking. Small bowel is mildly distended and fluid-filled up to 3 cm diameter and diffusely fluid-filled. Imaging features are compatible with ileus. Interval worsening of mild right and moderate left hydronephrosis with decreased perfusion to the left kidney compatible with obstructive uropathy GI Surgeries / Procedures:  - has NG tube  Central access: port-a-cath TPN start date: 01/06/21  Nutritional Goals: Goal TPN rate is  100 mL/hr (provides 120 g of protein and 2352 kcals per day)  RD Assessment (01/06/21): Kcal:  [id number] kcal Protein:  115-130 gm Fluid:   > 2.2 L/day  Current Nutrition:  Clear liquids  Plan:  D/C MIVF and concentrate TPN Concentrate TPN and reduce rate to 90 ml/hr (2160 ml/day) providing 121 g protein and 2307 kcal Electrolytes in TPN: Na 23mq/L, K  35 mEq/L, Ca 515m/L, Mg 8 mEq/L, and Phos 1513m/L. Cl:Ac 1:1 Add standard MVI and  trace elements to TPN Continue  Sensitive q6h SSI and adjust as needed  D/C MIVF at  25 mL/hr Monitor TPN labs on Mon/Thurs   [place] Form/11/2020 7:08 AM

## 2021-01-11 NOTE — Progress Notes (Signed)
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 She had NG tube replaced due to persistent signs and symptoms of carcinomatosis secondary to recurrent endometrial cancer She has completed 3 days course of IV antibiotics for UTI She had received carboplatin & Taxol & Herceptin last week on January 06, 2021 I do not anticipate clinical benefit for 5 to 7 days Recommend continue supportive care for the next 2 weeks If she has no improvement in the next 2 weeks, then we will be transitioning her care to comfort measures   Cancer associated pain She has multifactorial pain I recommend pain medicine as needed   Nausea with vomiting/ileus secondary to abdominal carcinomatosis She continues to intermittent nausea Recommend IV fluids and antiemetics as needed Do not remove NG tube until later this week I have reviewed her x-ray from January 11, 2021, not worse Her NG tube was off suction since 01/10/2021 I recommend we continue to have it off suction for another 24 hours She denies significant nausea this morning   Physical deconditioning She has generalized weakness and is inclined not to do therapy because of pain and weakness I encouraged the patient to continue to participate with physical therapy while on treatment   Severe protein calorie malnutrition Continue TPN   Acquired pancytopenia Likely due to recent chemo She does not need transfusion support Observe closely  Hydronephrosis Monitor renal function closely I spoke with urologist We can technically monitor this closely for now   UTI, adequately treated    Goals of care Resolution of ileus, nausea vomiting inability to eat 80% of required calorie intake by mouth I reviewed palliative intent of treatment with the patient and her daughter and they are willing to try She is aware, if despite chemotherapy, that if she did not  improve within 2 to 3 weeks time, her condition will be considered terminal at that point in time   Discharge planning She will likely be here for 5 to 7 days I will return to check on her tomorrow  All questions were answered. The patient knows to call the clinic with any problems, questions or concerns.   The total time spent in the appointment was 25 minutes encounter with patients including review of chart and various tests results, discussions about plan of care and coordination of care plan  [name], MD 01/11/2021 10:34 AM  Subjective:  Soon after her NG was off suction yesterday, she complained of nausea The patient is incredibly anxious Her daughter is by the bedside She felt much calmer this morning She denies nausea She has intermittent pain No bowel movement  Objective:  Vitals:   01/10/21 2321 01/11/21 0454  BP: (!) 146/68 (!) 158/81  Pulse: 86 90  Resp: 18 20  Temp: 97.9 F (36.6 C) 97.7 F (36.5 C)  SpO2: 97% 100%     Intake/Output Summary (Last 24 hours) at 01/11/2021 1034 Last data filed at 01/11/2021 0800 Gross per 24 hour  Intake 3057.31 ml  Output 1450 ml  Net 1607.31 ml    GENERAL:alert, no distress and comfortable, NG in situ NEURO: alert & oriented x 3 with fluent speech   Labs:  Recent Labs    02/23/20 1113 02/26/20 1007 01/06/21 0458 01/07/21 0647 01/08/21 0756 01/09/21 0429 01/10/21 0454 01/11/21 0300  NA 138   < > 138 137   < > 139 139 135  K 4.2   < >  4.1 4.6   < > 4.8 4.9 4.8  CL 102   < > 108 112*   < > 110 110 109  CO2 19*   < > 23 22   < > '22 23 23  '$ GLUCOSE 138*   < > 123* 190*   < > 178* 166* 146*  BUN 15   < > 17 15   < > 33* 41* 43*  CREATININE 0.80   < > 0.98 0.97   < > 0.85 0.87 0.81  CALCIUM 9.6   < > 8.7* 8.7*   < > 9.0 9.0 8.7*  GFRNONAA 79   < > >60 >60   < > >60 >60 >60  GFRAA 91  --   --   --   --   --   --   --   PROT 7.8   < > 5.4* 5.5*  --  5.1*  --   --   ALBUMIN 4.4   < > 2.3* 2.2*  --  2.1*  --   --   AST  14   < > 11* 15  --  14*  --   --   ALT 19   < > 9 10  --  11  --   --   ALKPHOS 115   < > 71 66  --  57  --   --   BILITOT 0.6   < > 0.4 0.3  --  0.2*  --   --    < > = values in this interval not displayed.    Studies: I have reviewed her x-ray from this morning DG Abd 1 View  Result Date: 01/03/2021 CLINICAL DATA:  Ileus EXAM: ABDOMEN - 1 VIEW COMPARISON:  Portable exam 0848 hours compared to 01/02/2021 FINDINGS: Tip of nasogastric tube projects over stomach. Excreted contrast material within urinary bladder with faint contrast bilaterally in dilated renal collecting systems. Single nonspecific air-filled nondistended small bowel loops in mid abdomen. Bowel gas pattern otherwise normal. Osseous structures unremarkable. Surgical clips RIGHT upper quadrant likely reflect prior cholecystectomy. IMPRESSION: BILATERAL hydronephrosis. Normal bowel gas pattern. Electronically Signed   By: Lavonia Dana M.D.   On: 01/03/2021 10:44   CT ABDOMEN PELVIS W CONTRAST  Result Date: 01/02/2021 CLINICAL DATA:  Endometrial cancer.  Bowel obstruction suspected. EXAM: CT ABDOMEN AND PELVIS WITH CONTRAST TECHNIQUE: Multidetector CT imaging of the abdomen and pelvis was performed using the standard protocol following bolus administration of intravenous contrast. CONTRAST:  43m OMNIPAQUE IOHEXOL 350 MG/ML SOLN COMPARISON:  11/02/2020 FINDINGS: Lower chest: Basilar atelectasis. Hepatobiliary: No suspicious focal abnormality within the liver parenchyma. Gallbladder is surgically absent. No intrahepatic or extrahepatic biliary dilation. Pancreas: No focal mass lesion. No dilatation of the main duct. No intraparenchymal cyst. No peripancreatic edema. Spleen: No splenomegaly. No focal mass lesion. Adrenals/Urinary Tract: No adrenal nodule or mass. Mild right and moderate left hydronephrosis evident with decreased perfusion to the left kidney. 1.3 cm lesion lower pole right kidney, previously characterized as hemorrhagic cyst.  More subtle enhancing lesion identified anterior lower pole right kidney, previously characterized as suspicious for neoplasm on CT without and with contrast dated 03/28/2020. Both ureters are dilated down to the level of the pelvic sidewall. Bladder is unremarkable. Stomach/Bowel: Stomach is unremarkable. No gastric wall thickening. No evidence of outlet obstruction. Duodenum is normally positioned as is the ligament of Treitz. No small bowel wall thickening. Small bowel is mildly distended up to 3 cm diameter and diffusely  fluid-filled. The terminal ileum is normal. The appendix is normal. No gross colonic mass. No colonic wall thickening. Vascular/Lymphatic: There is mild atherosclerotic calcification of the abdominal aorta without aneurysm. Small lymph nodes are seen in the gastrohepatic and hepatoduodenal ligament. Small lymph nodes evident in the retroperitoneal space of the abdomen along the pelvic sidewall bilaterally. Reproductive: Calcified fibroid noted in the uterus. Endometrium not well visualized. There is no adnexal mass. Other: Interval progression ascites with progression of omental soft tissue nodularity now demonstrating areas of confluent "caking" (see midline image 46/2). Enhancing peritoneal nodules are seen adjacent to the inferior liver (35/2) and in the right pelvis (63/2). There are enhancing mesenteric nodules (see central mesentery on 52/2) with soft tissue nodularity in the region of the umbilicus measuring 2.3 x 2.2 cm on image 54/2. Moderate volume ascites seen around the liver and spleen and along the stomach with moderate volume ascites in the pelvis is well. Musculoskeletal: No worrisome lytic or sclerotic osseous abnormality. IMPRESSION: 1. Interval progression of ascites with progression of omental soft tissue nodularity now demonstrating areas of confluent caking. Peritoneal nodularity is also progressive in the interval. 2. Small bowel is mildly distended and fluid-filled up to  3 cm diameter and diffusely fluid-filled. Imaging features are compatible with ileus. Evolving obstruction not entirely excluded. 3. Interval worsening of mild right and moderate left hydronephrosis with decreased perfusion to the left kidney compatible with obstructive uropathy. Both ureters are obstructed at the level of the pelvic sidewall. 4. Subtle enhancing lesion anterior lower pole right kidney, previously characterized as neoplasm on CT without and with contrast dated 03/28/2020. 5. Small lymph nodes in the gastrohepatic and hepatoduodenal ligament, in the retroperitoneal space of the abdomen, and along the pelvic sidewall bilaterally. 6. Calcified fibroid in the uterus. Endometrium not well visualized. 7. Aortic Atherosclerosis (ICD10-I70.0). Electronically Signed   By: Misty Stanley M.D.   On: 01/02/2021 11:56   DG Abd 2 Views  Result Date: 01/05/2021 CLINICAL DATA:  Abdomen pain and emesis EXAM: ABDOMEN - 2 VIEW COMPARISON:  01/03/2021, CT 01/02/2021 FINDINGS: Trace pleural effusion left lung base. No free air beneath the diaphragm. Increased small bowel distension, measuring up to 4.1 cm with multiple fluid levels. Paucity of distal gas. IMPRESSION: 1. Increased small bowel distension with multiple fluid levels and paucity of distal gas, suspect for bowel obstruction Electronically Signed   By: Donavan Foil M.D.   On: 01/05/2021 16:34   DG Abd 2 Views  Result Date: 12/22/2020 CLINICAL DATA:  Right lower quadrant abdominal pain with nausea, vomiting, and constipation. History of endometrial cancer. EXAM: ABDOMEN - 2 VIEW COMPARISON:  None. FINDINGS: The bowel gas pattern is normal. There is no evidence of free air. No radio-opaque calculi or other significant radiographic abnormality is seen. Surgical clips in the right upper quadrant and pelvis. No acute osseous abnormality. IMPRESSION: Negative. Electronically Signed   By: Titus Dubin M.D.   On: 12/22/2020 12:03   DG Abd Portable  1V  Result Date: 01/11/2021 CLINICAL DATA:  Bowel obstruction. EXAM: PORTABLE ABDOMEN - 1 VIEW COMPARISON:  01/08/2021 and CT abdomen pelvis 01/02/2021. FINDINGS: Image quality is degraded by body habitus. Nasogastric tube terminates in the stomach. Gas is seen in nondilated small bowel and some colon. Surgical clips in the right upper quadrant. IMPRESSION: No evidence of small-bowel obstruction. Electronically Signed   By: Lorin Picket M.D.   On: 01/11/2021 08:26   DG Abd Portable 1V  Result Date: 01/08/2021 CLINICAL DATA:  Nasogastric tube adjustment EXAM: PORTABLE ABDOMEN - 1 VIEW COMPARISON:  01/05/2021 FINDINGS: Nasogastric tube placement to the decompressed stomach. A few gas-filled nondilated small bowel loops in the mid abdomen. Colon decompressed. Cholecystectomy clips. IMPRESSION: Nasogastric tube to decompressed stomach. Electronically Signed   By: Lucrezia Europe M.D.   On: 01/08/2021 15:30   DG Abd Portable 1 View  Result Date: 01/02/2021 CLINICAL DATA:  Check gastric catheter placement EXAM: PORTABLE ABDOMEN - 1 VIEW COMPARISON:  None. FINDINGS: Gastric catheter is noted extending into the stomach. The proximal side port lies just beyond the gastroesophageal junction and could be advanced a few cm as necessary. Visualized lungs are hypoinflated but clear. Right chest wall port is noted. IMPRESSION: Gastric catheter in the stomach as described. Electronically Signed   By: Inez Catalina M.D.   On: 01/02/2021 14:01   [place] EKG SITE RITE  Result Date: 01/08/2021 If Site Rite image not attached, placement could not be confirmed due to current cardiac rhythm.

## 2021-01-11 NOTE — Progress Notes (Signed)
Nutrition Follow-up  DOCUMENTATION CODES:   Obesity unspecified  INTERVENTION:   -TPN management per Pharmacy  NUTRITION DIAGNOSIS:   Inadequate oral intake related to inability to eat as evidenced by NPO status.  Ongoing.  GOAL:   Patient will meet greater than or equal to 90% of their needs  Meeting with TPN  MONITOR:   Labs, Weight trends, I & O's, Other (Comment) (TPN regimen)  ASSESSMENT:   65 y.o. female with medical history of endometrial cancer (diagnosed January 2022), DM, HTN, and COVID infection early July 2022. She presented to the ED on 8/29 due to abdominal pain and vomiting x3 weeks and combination of constipation and diarrhea.  [facility] contacted by pharmacy regarding TPN regimen and pt's fluid needs. Pt currently +7.9L since admit. Plan is now for TPN to be more concentrated and MIVF d/c.  Goal later today will be 90 ml/hr (providing 2307 kcals and 121g protein.  Per oncology note, pt to continue supportive care for 2 weeks. If no improvement, they recommend comfort measures.   Admission weight: 178 lbs. Current weight: 237 lbs.  Medications: Magic mouthwash w/ lidocaine, D5 infusion, Compazine  Labs reviewed:  CBGs: 122-138  Diet Order:   Diet Order             Diet clear liquid Room service appropriate? Yes; Fluid consistency: Thin  Diet effective now                   EDUCATION NEEDS:   Education needs have been addressed  Skin:  Skin Assessment: Reviewed RN Assessment  Last BM:  8/31  Height:   Ht Readings from Last 1 Encounters:  12/27/20 '5\' 1"'$  (1.549 m)    Weight:   Wt Readings from Last 1 Encounters:  01/11/21 107.7 kg    Ideal Body Weight:  47.7 kg  BMI:  Body mass index is 44.86 kg/m.  Estimated Nutritional Needs:   Kcal:  2285-2500 kcal  Protein:  115-130 grams  Fluid:  >/= 2.2 L/day   Clayton Bibles, MS, RD, LDN Inpatient Clinical Dietitian Contact information available via Amion

## 2021-01-11 NOTE — Progress Notes (Addendum)
PROGRESS NOTE    Amanda Lin  [id number] DOB: Jul 18, 1955 DOA: 01/02/2021 PCP: Nicolette Bang, MD    Chief Complaint  Patient presents with   Abdominal Pain   Constipation    Brief Narrative:  65 year old lady with prior history of endometrial cancer, diabetes, hypertension and COVID-19 infection presents with abdominal pain and nausea and vomiting for about 3 weeks.  CT of the abdomen pelvis shows ileus and possible evolving bowel obstruction, progression of ascites and omental soft tissue nodularity, worsening bilateral hydronephrosis with obstructive uropathy.  An NG tube was placed discontinued on 01/04/2021 had recurrent small bowel distention with reinsertion of the NG tube on 01/05/2021.  TPN was initiated on 01/06/2021. Patient seen and examined this morning she appears to be anxious for which she was given a small dose of Ativan.  She also appears to be fluid overloaded.  Oncology on board and she underwent palliative chemotherapy. Recommend to monitor-2 to 3 weekS postchemotherapy and watch for any improvement and if no improvement plan to transition to comfort measures.  Assessment & Plan:   Principal Problem:   Endometrial carcinoma ([place]) Active Problems:   Cancer associated pain   Nausea with vomiting   Hypokalemia   Intestinal obstruction ([facility])   Acute cystitis with hematuria   Obstructive uropathy   Palliative care by specialist   Possible small bowel obstruction S/p NG tube placement KUB on 9 4/22 shows improvement oncology recommends keeping NG tube until later this week, until she takes regular food via mouth without any signs of obstruction. Continue with TPN for now. Patient currently denies any nausea or vomiting or abdominal distention. Patient recommended to ambulate every 4 hours while awake.  Progressive endometrial carcinoma Oncology on board, started palliative chemotherapy Plan for comfort measures if no improvement in the next 2  weeks.   Bilateral hydronephrosis left greater than right supportive care as long as renal function remains stable.  Urology on board   Essential hypertension Blood pressure parameters appear to be optimal at this time.   Stage III CKD/azotemia Creatinine appears to be optimal at this time.   Sore throat probably from the NG tube Improved with Magic mouthwash. GERD  Hypokalemia and hypomagnesemia Replaced.   Acute urinary tract infection Urine analysis with pyuria urine culture shows insignificant growth Patient completed 3 doses of IV Rocephin empirically.     DVT prophylaxis: (Lovenox/) Code Status: (Full code) Family Communication: daughter at bedside. Disposition:   Status is: Inpatient  Remains inpatient appropriate because:Unsafe d/c plan and IV treatments appropriate due to intensity of illness or inability to take PO  Dispo: The patient is from: Home              Anticipated d/c is to: Home              Patient currently is not medically stable to d/c.   Difficult to place patient No       Consultants:  Oncology Dr Alvy Bimler.   Procedures: none.   Antimicrobials: none.    Subjective: Calmer   Objective: Vitals:   01/10/21 1450 01/10/21 2321 01/11/21 0454 01/11/21 0500  BP: (!) 150/73 (!) 146/68 (!) 158/81   Pulse: 90 86 90   Resp: '17 18 20   '$ Temp: 98 F (36.7 C) 97.9 F (36.6 C) 97.7 F (36.5 C)   TempSrc:  Oral Oral   SpO2: 100% 97% 100%   Weight:    107.7 kg    Intake/Output Summary (Last 24  hours) at 01/11/2021 1256 Last data filed at 01/11/2021 0800 Gross per 24 hour  Intake 3057.31 ml  Output 1000 ml  Net 2057.31 ml   Filed Weights   01/08/21 0557 01/10/21 0500 01/11/21 0500  Weight: 102.4 kg 106.9 kg 107.7 kg    Examination:  General exam: ill appearing lady on NG tube, connected to suction.  Respiratory system: air entry fair, no wheezing on RA.  Cardiovascular system: S1 & S2 heard, RRR no JVD,  Gastrointestinal  system: Abdomen is  distended, soft, tender, bowel sounds minimal.  Central nervous system: alert and anxious.  Extremities: pedal edema present.  Skin: No rashes seen. Psychiatry: anxious.     Data Reviewed: I have personally reviewed following labs and imaging studies  CBC: Recent Labs  Lab 01/05/21 0516 01/06/21 0458 01/11/21 0300  WBC 9.0 8.6 5.1  NEUTROABS  --  6.2 4.3  HGB 10.1* 10.6* 8.8*  HCT 32.1* 32.8* 28.3*  MCV 90.4 89.9 92.5  PLT 273 235 131*    Basic Metabolic Panel: Recent Labs  Lab 01/07/21 0647 01/08/21 0756 01/09/21 0429 01/10/21 0454 01/11/21 0300  NA 137 138 139 139 135  K 4.6 4.6 4.8 4.9 4.8  CL 112* 111 110 110 109  CO2 '22 23 22 23 23  '$ GLUCOSE 190* 138* 178* 166* 146*  BUN 15 25* 33* 41* 43*  CREATININE 0.97 0.99 0.85 0.87 0.81  CALCIUM 8.7* 8.8* 9.0 9.0 8.7*  MG 1.8 2.2 2.2 2.2 2.1  PHOS 3.0 2.9 3.2 3.6 3.8    GFR: Estimated Creatinine Clearance: 79.5 mL/min (by C-G formula based on SCr of 0.81 mg/dL).  Liver Function Tests: Recent Labs  Lab 01/05/21 0516 01/06/21 0458 01/07/21 0647 01/09/21 0429  AST  --  11* 15 14*  ALT  --  '9 10 11  '$ ALKPHOS  --  71 66 57  BILITOT  --  0.4 0.3 0.2*  PROT  --  5.4* 5.5* 5.1*  ALBUMIN 2.3* 2.3* 2.2* 2.1*    CBG: Recent Labs  Lab 01/10/21 1159 01/10/21 1720 01/10/21 2322 01/11/21 0455 01/11/21 1223  GLUCAP 160* 129* 122* 138* 126*     Recent Results (from the past 240 hour(s))  Urine Culture     Status: Abnormal   Collection Time: 01/02/21 12:35 PM   Specimen: Urine, Clean Catch  Result Value Ref Range Status   Specimen Description   Final    URINE, CLEAN CATCH Performed at [facility], [place] [address] [place], [place] [address]    Special Requests   Final    NONE Performed at [facility], [place] [address], [place], [place] [address]    Culture (A)  Final    <10,000 COLONIES/mL INSIGNIFICANT GROWTH Performed at [facility] [address], [place], [place] [address]    Report Status 01/04/2021 FINAL  Final  Culture, blood (routine x 2)     Status: None   Collection Time: 01/02/21  9:02 PM   Specimen: Left Antecubital; Blood  Result Value Ref Range Status   Specimen Description   Final    LEFT ANTECUBITAL Performed at [facility] [address], [place], [place] [address]    Special Requests   Final    BOTTLES DRAWN AEROBIC ONLY Blood Culture results may not be optimal due to an inadequate volume of blood received in culture bottles Performed at [facility] [address] [place], [place] [address]  Culture   Final    NO GROWTH 5 DAYS Performed at [facility], [place] [address], [place], [place] [address]    Report Status 01/08/2021 FINAL  Final         Radiology Studies: DG Abd Portable 1V  Result Date: 01/11/2021 CLINICAL DATA:  Bowel obstruction. EXAM: PORTABLE ABDOMEN - 1 VIEW COMPARISON:  01/08/2021 and CT abdomen pelvis 01/02/2021. FINDINGS: Image quality is degraded by body habitus. Nasogastric tube terminates in the stomach. Gas is seen in nondilated small bowel and some colon. Surgical clips in the right upper quadrant. IMPRESSION: No evidence of small-bowel obstruction. Electronically Signed   By: Lorin Picket M.D.   On: 01/11/2021 08:26        Scheduled Meds:  acetaminophen  650 mg Oral Once   Chlorhexidine Gluconate Cloth  6 each Topical Daily   cloNIDine  0.1 mg Transdermal Weekly   enoxaparin (LOVENOX) injection  40 mg Subcutaneous Q24H   insulin aspart  0-9 Units Subcutaneous Q6H   magic mouthwash w/lidocaine  5 mL Oral QID   pantoprazole (PROTONIX) IV  40 mg Intravenous Q24H   sodium chloride flush  10-40 mL Intracatheter Q12H   Continuous Infusions:  TPN ADULT (ION)     TPN ADULT (ION) 100 mL/hr at 01/11/21 1227     LOS: 9 days        Hosie Poisson, MD [facility]   To contact the attending provider between  7A-7P or the covering provider during after hours 7P-7A, please log into the web site [email or web address] and access using universal [place] password for that web site. If you do not have the password, please call the hospital operator.  01/11/2021, 12:56 PM

## 2021-01-12 ENCOUNTER — Inpatient Hospital Stay (HOSPITAL_COMMUNITY): Payer: 59

## 2021-01-12 DIAGNOSIS — E43 Unspecified severe protein-calorie malnutrition: Secondary | ICD-10-CM

## 2021-01-12 DIAGNOSIS — E876 Hypokalemia: Secondary | ICD-10-CM | POA: Diagnosis not present

## 2021-01-12 DIAGNOSIS — N1831 Chronic kidney disease, stage 3a: Secondary | ICD-10-CM

## 2021-01-12 DIAGNOSIS — N3001 Acute cystitis with hematuria: Secondary | ICD-10-CM | POA: Diagnosis not present

## 2021-01-12 DIAGNOSIS — C541 Malignant neoplasm of endometrium: Secondary | ICD-10-CM | POA: Diagnosis not present

## 2021-01-12 DIAGNOSIS — G893 Neoplasm related pain (acute) (chronic): Secondary | ICD-10-CM | POA: Diagnosis not present

## 2021-01-12 LAB — COMPREHENSIVE METABOLIC PANEL
ALT: 20 U/L (ref 0–44)
AST: 23 U/L (ref 15–41)
Albumin: 2 g/dL — ABNORMAL LOW (ref 3.5–5.0)
Alkaline Phosphatase: 72 U/L (ref 38–126)
Anion gap: 4 — ABNORMAL LOW (ref 5–15)
BUN: 47 mg/dL — ABNORMAL HIGH (ref 8–23)
CO2: 22 mmol/L (ref 22–32)
Calcium: 9.1 mg/dL (ref 8.9–10.3)
Chloride: 116 mmol/L — ABNORMAL HIGH (ref 98–111)
Creatinine, Ser: 0.78 mg/dL (ref 0.44–1.00)
GFR, Estimated: >60 mL/min (ref >60)
Glucose, Bld: 162 mg/dL — ABNORMAL HIGH (ref 70–99)
Potassium: 5.3 mmol/L — ABNORMAL HIGH (ref 3.5–5.1)
Sodium: 142 mmol/L (ref 135–145)
Total Bilirubin: 0.4 mg/dL (ref 0.3–1.2)
Total Protein: 4.8 g/dL — ABNORMAL LOW (ref 6.5–8.1)

## 2021-01-12 LAB — BASIC METABOLIC PANEL
Anion gap: 3 — ABNORMAL LOW (ref 5–15)
BUN: 46 mg/dL — ABNORMAL HIGH (ref 8–23)
CO2: 23 mmol/L (ref 22–32)
Calcium: 9.1 mg/dL (ref 8.9–10.3)
Chloride: 115 mmol/L — ABNORMAL HIGH (ref 98–111)
Creatinine, Ser: 0.88 mg/dL (ref 0.44–1.00)
GFR, Estimated: >60 mL/min (ref >60)
Glucose, Bld: 121 mg/dL — ABNORMAL HIGH (ref 70–99)
Potassium: 5 mmol/L (ref 3.5–5.1)
Sodium: 141 mmol/L (ref 135–145)

## 2021-01-12 LAB — GLUCOSE, CAPILLARY
Glucose-Capillary: 116 mg/dL — ABNORMAL HIGH (ref 70–99)
Glucose-Capillary: 128 mg/dL — ABNORMAL HIGH (ref 70–99)
Glucose-Capillary: 134 mg/dL — ABNORMAL HIGH (ref 70–99)
Glucose-Capillary: 148 mg/dL — ABNORMAL HIGH (ref 70–99)

## 2021-01-12 LAB — CBC
HCT: 25.5 % — ABNORMAL LOW (ref 36.0–46.0)
Hemoglobin: 8.1 g/dL — ABNORMAL LOW (ref 12.0–15.0)
MCH: 28.8 pg (ref 26.0–34.0)
MCHC: 31.8 g/dL (ref 30.0–36.0)
MCV: 90.7 fL (ref 80.0–100.0)
Platelets: 123 10*3/uL — ABNORMAL LOW (ref 150–400)
RBC: 2.81 MIL/uL — ABNORMAL LOW (ref 3.87–5.11)
RDW: 16.2 % — ABNORMAL HIGH (ref 11.5–15.5)
WBC: 4 10*3/uL (ref 4.0–10.5)
nRBC: 0 % (ref 0.0–0.2)

## 2021-01-12 LAB — PHOSPHORUS: Phosphorus: 4.2 mg/dL (ref 2.5–4.6)

## 2021-01-12 LAB — MAGNESIUM: Magnesium: 2.3 mg/dL (ref 1.7–2.4)

## 2021-01-12 MED ORDER — LORAZEPAM 1 MG PO TABS
1.0000 mg | ORAL_TABLET | Freq: Two times a day (BID) | ORAL | Status: DC | PRN
  Administered 2021-01-12 – 2021-01-16 (×7): 1 mg via ORAL
  Filled 2021-01-12 (×7): qty 1

## 2021-01-12 MED ORDER — TRAVASOL 10 % IV SOLN
INTRAVENOUS | Status: DC
  Filled 2021-01-12: qty 1209.6

## 2021-01-12 MED ORDER — SODIUM CHLORIDE 4 MEQ/ML IV SOLN
INTRAVENOUS | Status: AC
  Filled 2021-01-12: qty 614.4

## 2021-01-12 MED ORDER — BOOST / RESOURCE BREEZE PO LIQD CUSTOM
1.0000 | Freq: Three times a day (TID) | ORAL | Status: DC
  Administered 2021-01-12 – 2021-01-15 (×7): 1 via ORAL

## 2021-01-12 MED ORDER — TRAVASOL 10 % IV SOLN: INTRAVENOUS | Status: AC

## 2021-01-12 NOTE — Progress Notes (Addendum)
PHARMACY - TOTAL PARENTERAL NUTRITION CONSULT NOTE   Indication: Prolonged ileus  Patient Measurements: Weight: 111.4 kg (245 lb 9.5 oz)   Body mass index is 46.4 kg/m. Usual Weight:   Assessment: Patient is a 65 y.o F with endometrial cancer presented to the ED on 01/02/21 with c/o abdominal pain and n/v.  Abd CT on 8/29 showed findings consistent with ileus and disease progress. Plan is to start chemotherapy with this admission and to start TPN on 9/2 for ileus.  Glucose / Insulin: not on insulin - cbgs (goal <150): range 118-162 - 2 units of insulin given in past 24 hours  Electrolytes: K elevated @ 5.3, Cl elevated @ 116, Na trending up significantly but wnl, phos trending up but remains wnl, corrCa 10.7 - goal K >4, mag >2 for ileus Renal: scr <1 but BUN continues to be elevated and trending up, UOP down slighlty Hepatic: LFT WNL, TBili wnl. Triglycerides 108 ( 9/3), 156 ( 9/5)  Intake / Output; MIVF:  - no MIVF - I/O: 550 ml NG output + emesis, UOP down with only about 450 ml output yesterday. Looks like input inaccurate but still net + at least 8 L this admission GI Imaging: - 8/29 abd CT: Interval progression of ascites with progression of omental soft tissue nodularity now demonstrating areas of confluent caking. Small bowel is mildly distended and fluid-filled up to 3 cm diameter and diffusely fluid-filled. Imaging features are compatible with ileus. Interval worsening of mild right and moderate left hydronephrosis with decreased perfusion to the left kidney compatible with obstructive uropathy GI Surgeries / Procedures:  - has NG tube  Central access: port-a-cath TPN start date: 01/06/21  Nutritional Goals: Goal TPN rate is  100 mL/hr (provides 120 g of protein and 2352 kcals per day)  RD Assessment (01/06/21): Kcal:  [id number] kcal Protein:  115-130 gm Fluid:   > 2.2 L/day  80% goals 1828- 2000 kcal, 92-104 g protein  Current Nutrition:  Clear liquids  Plan:   Decrease TPN rate to 60 ml/hr and target 80% of calorie and protein goals discussed with Dr. Alvy Bimler and Dr. Karleen Hampshire TPN @ 60 ml/hr will provide 92 g protein and 1829 kcal Remove potassium and phosphorus from TPN, decrease Na and Mg Electrolytes in TPN: Na 35 mEq/L, K  0 mEq/L, Ca 88mq/L, Mg 5 mEq/L, and Phos 023ml/L. Change Cl:Ac to 1:2 Add standard MVI and trace elements to TPN Continue  Sensitive q6h SSI and adjust as needed  No MIVF Monitor TPN labs on Mon/Thurs BMET, Mg, and Phos in AM   ChUlice Dash  01/12/2021 9:42 AM

## 2021-01-12 NOTE — Progress Notes (Addendum)
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 She had NG tube replaced due to persistent signs and symptoms of carcinomatosis secondary to recurrent endometrial cancer She has completed 3 days course of IV antibiotics for UTI She had received carboplatin & Taxol & Herceptin last week on January 06, 2021 I do not anticipate clinical benefit for 5 to 7 days Recommend continue supportive care for the next 2 weeks If she has no improvement in the next 2 weeks, then we will be transitioning her care to comfort measures   Cancer associated pain She has multifactorial pain I recommend pain medicine as needed   Nausea with vomiting/ileus secondary to abdominal carcinomatosis She continues to intermittent nausea Recommend IV fluids and antiemetics as needed Abdominal x-ray obtained this morning which shows nonobstructive gas pattern However, symptoms seem to be worse Recommend NG tube be placed back on suction   Physical deconditioning She has generalized weakness and is inclined not to do therapy because of pain and weakness I encouraged the patient to continue to participate with physical therapy while on treatment   Severe protein calorie malnutrition Continue TPN   Acquired pancytopenia Likely due to recent chemo She does not need transfusion support Observe closely  Hydronephrosis Monitor renal function closely I spoke with urologist We can technically monitor this closely for now   UTI, adequately treated  Severe constipation Secondary to carcinomatosis    Goals of care Resolution of ileus, nausea vomiting inability to eat 80% of required calorie intake by mouth I reviewed palliative intent of treatment with the patient and her daughter and they are willing to try She is aware, if despite chemotherapy, that  if she did not improve within 2 to 3 weeks time, her condition will be considered terminal at that point in time   Discharge planning She will likely be here for 5 to 7 days I will return to check on her tomorrow  All questions were answered. The patient knows to call the clinic with any problems, questions or concerns.   [name], NP 01/12/2021 8:45 AM Amanda Lark, MD  Subjective:  Taking is small sips of liquid Rates abdominal pain 5/10 She feels that her abdomen is more distended today Has not moved her bowels since last Wednesday She has been having intermittent nausea and vomiting, last vomited in the evening Daughter is at the bedside  Objective:  Vitals:   01/11/21 2024 01/12/21 0609  BP: (!) 153/74 (!) 143/58  Pulse: 92 (!) 107  Resp: 16 18  Temp: 98 F (36.7 C) 97.8 F (36.6 C)  SpO2: 97% 97%     Intake/Output Summary (Last 24 hours) at 01/12/2021 0845 Last data filed at 01/12/2021 0346 Gross per 24 hour  Intake 620 ml  Output 900 ml  Net -280 ml    GENERAL:alert, no distress and comfortable, NG in situ NEURO: alert & oriented x 3 with fluent speech On her abdominal exam, she appears more distended and bloated in the epigastrium.  She have active bowel sounds in upper quadrant but no bowel sounds in the lower quadrant of her abdomen   Labs:  Recent Labs    02/23/20 1113 02/26/20 1007 01/07/21 0647 01/08/21 0756 01/09/21 0429 01/10/21 0454 01/11/21 0300 01/12/21 0439  NA 138   < > 137   < > 139 139 135  142  K 4.2   < > 4.6   < > 4.8 4.9 4.8 5.3*  CL 102   < > 112*   < > 110 110 109 116*  CO2 19*   < > 22   < > '22 23 23 22  '$ GLUCOSE 138*   < > 190*   < > 178* 166* 146* 162*  BUN 15   < > 15   < > 33* 41* 43* 47*  CREATININE 0.80   < > 0.97   < > 0.85 0.87 0.81 0.78  CALCIUM 9.6   < > 8.7*   < > 9.0 9.0 8.7* 9.1  GFRNONAA 79   < > >60   < > >60 >60 >60 >60  GFRAA 91  --   --   --   --   --   --   --   PROT 7.8   < > 5.5*  --  5.1*  --   --  4.8*   ALBUMIN 4.4   < > 2.2*  --  2.1*  --   --  2.0*  AST 14   < > 15  --  14*  --   --  23  ALT 19   < > 10  --  11  --   --  20  ALKPHOS 115   < > 66  --  57  --   --  72  BILITOT 0.6   < > 0.3  --  0.2*  --   --  0.4   < > = values in this interval not displayed.    Studies: I have reviewed her x-ray from this morning DG Abd 1 View  Result Date: 01/03/2021 CLINICAL DATA:  Ileus EXAM: ABDOMEN - 1 VIEW COMPARISON:  Portable exam 0848 hours compared to 01/02/2021 FINDINGS: Tip of nasogastric tube projects over stomach. Excreted contrast material within urinary bladder with faint contrast bilaterally in dilated renal collecting systems. Single nonspecific air-filled nondistended small bowel loops in mid abdomen. Bowel gas pattern otherwise normal. Osseous structures unremarkable. Surgical clips RIGHT upper quadrant likely reflect prior cholecystectomy. IMPRESSION: BILATERAL hydronephrosis. Normal bowel gas pattern. Electronically Signed   By: Lavonia Dana M.D.   On: 01/03/2021 10:44   CT ABDOMEN PELVIS W CONTRAST  Result Date: 01/02/2021 CLINICAL DATA:  Endometrial cancer.  Bowel obstruction suspected. EXAM: CT ABDOMEN AND PELVIS WITH CONTRAST TECHNIQUE: Multidetector CT imaging of the abdomen and pelvis was performed using the standard protocol following bolus administration of intravenous contrast. CONTRAST:  103m OMNIPAQUE IOHEXOL 350 MG/ML SOLN COMPARISON:  11/02/2020 FINDINGS: Lower chest: Basilar atelectasis. Hepatobiliary: No suspicious focal abnormality within the liver parenchyma. Gallbladder is surgically absent. No intrahepatic or extrahepatic biliary dilation. Pancreas: No focal mass lesion. No dilatation of the main duct. No intraparenchymal cyst. No peripancreatic edema. Spleen: No splenomegaly. No focal mass lesion. Adrenals/Urinary Tract: No adrenal nodule or mass. Mild right and moderate left hydronephrosis evident with decreased perfusion to the left kidney. 1.3 cm lesion lower pole right  kidney, previously characterized as hemorrhagic cyst. More subtle enhancing lesion identified anterior lower pole right kidney, previously characterized as suspicious for neoplasm on CT without and with contrast dated 03/28/2020. Both ureters are dilated down to the level of the pelvic sidewall. Bladder is unremarkable. Stomach/Bowel: Stomach is unremarkable. No gastric wall thickening. No evidence of outlet obstruction. Duodenum is normally positioned as is the ligament of Treitz. No small bowel wall thickening. Small bowel is mildly  distended up to 3 cm diameter and diffusely fluid-filled. The terminal ileum is normal. The appendix is normal. No gross colonic mass. No colonic wall thickening. Vascular/Lymphatic: There is mild atherosclerotic calcification of the abdominal aorta without aneurysm. Small lymph nodes are seen in the gastrohepatic and hepatoduodenal ligament. Small lymph nodes evident in the retroperitoneal space of the abdomen along the pelvic sidewall bilaterally. Reproductive: Calcified fibroid noted in the uterus. Endometrium not well visualized. There is no adnexal mass. Other: Interval progression ascites with progression of omental soft tissue nodularity now demonstrating areas of confluent "caking" (see midline image 46/2). Enhancing peritoneal nodules are seen adjacent to the inferior liver (35/2) and in the right pelvis (63/2). There are enhancing mesenteric nodules (see central mesentery on 52/2) with soft tissue nodularity in the region of the umbilicus measuring 2.3 x 2.2 cm on image 54/2. Moderate volume ascites seen around the liver and spleen and along the stomach with moderate volume ascites in the pelvis is well. Musculoskeletal: No worrisome lytic or sclerotic osseous abnormality. IMPRESSION: 1. Interval progression of ascites with progression of omental soft tissue nodularity now demonstrating areas of confluent caking. Peritoneal nodularity is also progressive in the interval. 2.  Small bowel is mildly distended and fluid-filled up to 3 cm diameter and diffusely fluid-filled. Imaging features are compatible with ileus. Evolving obstruction not entirely excluded. 3. Interval worsening of mild right and moderate left hydronephrosis with decreased perfusion to the left kidney compatible with obstructive uropathy. Both ureters are obstructed at the level of the pelvic sidewall. 4. Subtle enhancing lesion anterior lower pole right kidney, previously characterized as neoplasm on CT without and with contrast dated 03/28/2020. 5. Small lymph nodes in the gastrohepatic and hepatoduodenal ligament, in the retroperitoneal space of the abdomen, and along the pelvic sidewall bilaterally. 6. Calcified fibroid in the uterus. Endometrium not well visualized. 7. Aortic Atherosclerosis (ICD10-I70.0). Electronically Signed   By: Misty Stanley M.D.   On: 01/02/2021 11:56   DG Abd 2 Views  Result Date: 01/05/2021 CLINICAL DATA:  Abdomen pain and emesis EXAM: ABDOMEN - 2 VIEW COMPARISON:  01/03/2021, CT 01/02/2021 FINDINGS: Trace pleural effusion left lung base. No free air beneath the diaphragm. Increased small bowel distension, measuring up to 4.1 cm with multiple fluid levels. Paucity of distal gas. IMPRESSION: 1. Increased small bowel distension with multiple fluid levels and paucity of distal gas, suspect for bowel obstruction Electronically Signed   By: Donavan Foil M.D.   On: 01/05/2021 16:34   DG Abd 2 Views  Result Date: 12/22/2020 CLINICAL DATA:  Right lower quadrant abdominal pain with nausea, vomiting, and constipation. History of endometrial cancer. EXAM: ABDOMEN - 2 VIEW COMPARISON:  None. FINDINGS: The bowel gas pattern is normal. There is no evidence of free air. No radio-opaque calculi or other significant radiographic abnormality is seen. Surgical clips in the right upper quadrant and pelvis. No acute osseous abnormality. IMPRESSION: Negative. Electronically Signed   By: Titus Dubin  M.D.   On: 12/22/2020 12:03   DG Abd Portable 1V  Result Date: 01/11/2021 CLINICAL DATA:  Bowel obstruction. EXAM: PORTABLE ABDOMEN - 1 VIEW COMPARISON:  01/08/2021 and CT abdomen pelvis 01/02/2021. FINDINGS: Image quality is degraded by body habitus. Nasogastric tube terminates in the stomach. Gas is seen in nondilated small bowel and some colon. Surgical clips in the right upper quadrant. IMPRESSION: No evidence of small-bowel obstruction. Electronically Signed   By: Lorin Picket M.D.   On: 01/11/2021 08:26   DG Abd Portable  1V  Result Date: 01/08/2021 CLINICAL DATA:  Nasogastric tube adjustment EXAM: PORTABLE ABDOMEN - 1 VIEW COMPARISON:  01/05/2021 FINDINGS: Nasogastric tube placement to the decompressed stomach. A few gas-filled nondilated small bowel loops in the mid abdomen. Colon decompressed. Cholecystectomy clips. IMPRESSION: Nasogastric tube to decompressed stomach. Electronically Signed   By: Lucrezia Europe M.D.   On: 01/08/2021 15:30   DG Abd Portable 1 View  Result Date: 01/02/2021 CLINICAL DATA:  Check gastric catheter placement EXAM: PORTABLE ABDOMEN - 1 VIEW COMPARISON:  None. FINDINGS: Gastric catheter is noted extending into the stomach. The proximal side port lies just beyond the gastroesophageal junction and could be advanced a few cm as necessary. Visualized lungs are hypoinflated but clear. Right chest wall port is noted. IMPRESSION: Gastric catheter in the stomach as described. Electronically Signed   By: Inez Catalina M.D.   On: 01/02/2021 14:01   [place] EKG SITE RITE  Result Date: 01/08/2021 If Site Rite image not attached, placement could not be confirmed due to current cardiac rhythm.

## 2021-01-12 NOTE — Progress Notes (Signed)
Occupational Therapy Treatment Patient Details Name: Amanda Lin MRN: [id number] DOB: [birth year] Today's Date: 01/12/2021    History of present illness 65 y.o. female  who presents to the emergency department on 01/02/2021 with abdominal pain and vomiting. Dx of UTI, ileus, hydronephrosis. Pt with medical history significant for endometrial cancer (diagnosed January 2022), diabetes, hypertension, COVID infection early July 2022.   OT comments  Patient fatigued however wanting to use bedside commode. Overall min G assist to stand from recliner and bedside commode needing cues for body mechanics. Reliant on UE support on walker therefore total A for perianal care. Note blood when performing perianal care, notified RN. Acute OT to follow.    Follow Up Recommendations  Home health OT;Supervision/Assistance - 24 hour    Equipment Recommendations  3 in 1 bedside commode;Other (comment) (hip kit)       Precautions / Restrictions Precautions Precautions: Fall Precaution Comments: central port, denies h/o falls in past 6 months       Mobility Bed Mobility               General bed mobility comments: up in recliner    Transfers Overall transfer level: Needs assistance Equipment used: Rolling walker (2 wheeled) Transfers: Sit to/from [facility] Sit to Stand: Min guard Stand pivot transfers: Min guard       General transfer comment: did not need physical assistance to power up to standing, min G and cues for body mechanics    Balance Overall balance assessment: Needs assistance Sitting-balance support: Feet supported Sitting balance-Leahy Scale: Fair     Standing balance support: Bilateral upper extremity supported Standing balance-Leahy Scale: Poor Standing balance comment: with rolling walker. relies on BUE support                           ADL either performed or assessed with clinical judgement   ADL Overall ADL's : Needs  assistance/impaired     Grooming: Wash/dry face;Wash/dry hands;Set up;Sitting                   Toilet Transfer: Min guard;RW;BSC;Cueing for safety;Cueing for sequencing Toilet Transfer Details (indicate cue type and reason): cues for body mechanics otherwise able to power up to standing from recliner and bedside commode. does need increased time for all mobility Toileting- Clothing Manipulation and Hygiene: Total assistance;Sit to/from stand Toileting - Clothing Manipulation Details (indicate cue type and reason): reliant on UE support on walker, note blood when cleaning rectal area and RN notified     Functional mobility during ADLs: Min guard;Rolling walker General ADL Comments: limited activity tolerance, patient asking to sit back down once off BSC      Cognition Arousal/Alertness: Awake/alert Behavior During Therapy: WFL for tasks assessed/performed Overall Cognitive Status: Within Functional Limits for tasks assessed                                          Exercises General Exercises - Lower Extremity Ankle Circles/Pumps: AROM;Both;15 reps;Seated Long Arc Quad: AROM;Both;10 reps;Seated;AAROM Heel Slides: AROM;Both;10 reps;Seated Shoulder Exercises Shoulder Flexion: AROM;Both;10 reps;Seated           Pertinent Vitals/ Pain       Pain Assessment: Faces Pain Score: 4  Faces Pain Scale: Hurts even more Pain Location: L knee Pain Descriptors / Indicators: Constant;Guarding;Grimacing Pain Intervention(s): Monitored during session;Limited  activity within patient's tolerance         Frequency  Min 2X/week        Progress Toward Goals  OT Goals(current goals can now be found in the care plan section)  Progress towards OT goals: Progressing toward goals  Acute Rehab OT Goals Patient Stated Goal: increase endurance OT Goal Formulation: With patient/family Time For Goal Achievement: 01/24/21 Potential to Achieve Goals: Good ADL  Goals Pt Will Perform Lower Body Dressing: with modified independence;with adaptive equipment;sit to/from stand Pt Will Transfer to Toilet: with modified independence;ambulating;bedside commode Pt Will Perform Toileting - Clothing Manipulation and hygiene: with modified independence;sit to/from stand Additional ADL Goal #1: Patient will perform 10 min functional activity or exercise activity as evidence of improving activity tolerance  Plan Discharge plan remains appropriate       AM-PAC OT "6 Clicks" Daily Activity     Outcome Measure   Help from another person eating meals?: Total Help from another person taking care of personal grooming?: A Little Help from another person toileting, which includes using toliet, bedpan, or urinal?: A Lot Help from another person bathing (including washing, rinsing, drying)?: A Lot Help from another person to put on and taking off regular upper body clothing?: A Little Help from another person to put on and taking off regular lower body clothing?: A Lot 6 Click Score: 13    End of Session Equipment Utilized During Treatment: Rolling walker  OT Visit Diagnosis: Unsteadiness on feet (R26.81);Pain;Muscle weakness (generalized) (M62.81) Pain - Right/Left: Left Pain - part of body: Leg;Knee   Activity Tolerance Patient limited by fatigue   Patient Left in chair;with call bell/phone within reach;with family/visitor present   Nurse Communication Mobility status;Other (comment) (bleeding from rectum)        Time: 1100-1118 OT Time Calculation (min): 18 min  Charges: OT General Charges $OT Visit: 1 Visit OT Treatments $Self Care/Home Management : 8-22 mins  Delbert Phenix OT OT pager: [phone]   Rosemary Holms 01/12/2021, 2:38 PM

## 2021-01-12 NOTE — Progress Notes (Signed)
Nutrition Follow-up  DOCUMENTATION CODES:   Obesity unspecified  INTERVENTION:   -TPN management per Pharmacy  -Boost Breeze po TID, each supplement provides 250 kcal and 9 grams of protein  NUTRITION DIAGNOSIS:   Inadequate oral intake related to inability to eat as evidenced by NPO status.  Ongoing.  GOAL:   Patient will meet greater than or equal to 90% of their needs  Progressing.  MONITOR:   Labs, Weight trends, I & O's, Other (Comment) (TPN regimen)   ASSESSMENT:   65 y.o. female with medical history of endometrial cancer (diagnosed January 2022), DM, HTN, and COVID infection early July 2022. She presented to the ED on 8/29 due to abdominal pain and vomiting x3 weeks and combination of constipation and diarrhea.  Per discussion with Pharmacy, pt with concerning labs. Still receiving high amounts of fluid. Pharmacy to decrease TPN rate to 60 ml/hr (providing 1828 kcals and 92g protein). RD adjusted protein recommendation as well.  RD will order Boost Breeze to provide extra kcals and protein while pt is on clears. Will continue to monitor for additional diet advancement.  Admission weight: 178 lbs. Current weight: 245 lbs.  I/Os: +7.6L since admit UOP: 450 ml x 24 hrs NGT: 550 ml  Medications: Magic mouthwash w/ lidocaine  Labs reviewed:  CBGs: 118-148 Elevated K (5.3)  Diet Order:   Diet Order             DIET DYS 3 Room service appropriate? Yes; Fluid consistency: Thin  Diet effective now                   EDUCATION NEEDS:   Education needs have been addressed  Skin:  Skin Assessment: Reviewed RN Assessment  Last BM:  8/31  Height:   Ht Readings from Last 1 Encounters:  12/27/20 '5\' 1"'$  (1.549 m)    Weight:   Wt Readings from Last 1 Encounters:  01/12/21 111.4 kg    BMI:  Body mass index is 46.4 kg/m.  Estimated Nutritional Needs:   Kcal:  [id number] kcal  Protein:  95-110g  Fluid:  2L/day   Clayton Bibles, MS, RD,  LDN Inpatient Clinical Dietitian Contact information available via Amion

## 2021-01-12 NOTE — Progress Notes (Signed)
Pt has had a restless and uncomfortable night. Multiple doses of IV morphine given for neck and back pain; repositioned frequently. Pt very anxious and tearful at times. PRN ativan PO given, pt rested for a couple hours afterward. Provided emotional support. Hortencia Conradi RN

## 2021-01-12 NOTE — Progress Notes (Signed)
PROGRESS NOTE    Amanda Lin  [id number] DOB: October 25, 1955 DOA: 01/02/2021 PCP: Nicolette Bang, MD    Chief Complaint  Patient presents with   Abdominal Pain   Constipation    Brief Narrative:  65 year old lady with prior history of endometrial cancer, diabetes, hypertension and COVID-19 infection presents with abdominal pain and nausea and vomiting for about 3 weeks.  CT of the abdomen pelvis shows ileus and possible evolving bowel obstruction, progression of ascites and omental soft tissue nodularity, worsening bilateral hydronephrosis with obstructive uropathy.  An NG tube was placed discontinued on 01/04/2021 had recurrent small bowel distention with reinsertion of the NG tube on 01/05/2021.  TPN was initiated on 01/06/2021. Patient seen and examined this morning she appears to be anxious for which she was given a small dose of Ativan.  She also appears to be fluid overloaded.  Oncology on board and she underwent palliative chemotherapy. Recommend to monitor-2 to 3 weeks postchemotherapy and watch for any improvement and if no improvement plan to transition to comfort measures. Pt seen and examined at bedside, she wanted to try some solids today. We will give her some ice cream as comfort feeds.   Assessment & Plan:   Principal Problem:   Endometrial carcinoma ([place]) Active Problems:   Cancer associated pain   Nausea with vomiting   Hypokalemia   Intestinal obstruction (HCC)   Acute cystitis with hematuria   Obstructive uropathy   Palliative care by specialist   Possible small bowel obstruction S/p NG tube placement KUB on 9 4/22 shows improvement oncology recommends keeping NG tube until later this week, until she takes regular food  Continue with TPN for now. Patient currently denies any vomiting , abdomen remains distended, patient reports slightly nauseated in the morning. Patient recommended to ambulate every 4 hours while awake. 2 view abdominal films  does not show any obstruction at this time.   Progressive endometrial carcinoma Oncology on board, started palliative chemotherapy Plan for comfort measures if no improvement in the next 2 weeks. Patient has vaginal serosanguineous discharge/bleeding today. Check CBC later this evening   Bilateral hydronephrosis left greater than right supportive care as long as renal function remains stable.  Urology on board   Essential hypertension Blood pressure parameters appear to be optimal at this time   Stage III CKD/azotemia Creatinine appears to be optimal at this time.   Sore throat probably from the NG tube Improved with Magic mouthwash. GERD  Hypokalemia and hypomagnesemia Replaced.   Acute urinary tract infection Urine analysis with pyuria urine culture shows insignificant growth Patient completed 3 doses of IV Rocephin empirically.  Hyperkalemia Potassium of 5.3. Discussed with pharmacy ,  adjusted electrolytes in TPN.  Anxiety As needed Ativan ordered  DVT prophylaxis: (Lovenox/) Code Status: (Full code) Family Communication: daughter at bedside. Disposition:   Status is: Inpatient  Remains inpatient appropriate because:Unsafe d/c plan and IV treatments appropriate due to intensity of illness or inability to take PO  Dispo: The patient is from: Home              Anticipated d/c is to: Home              Patient currently is not medically stable to d/c.   Difficult to place patient No       Consultants:  Oncology Dr Alvy Bimler.   Procedures: none.   Antimicrobials: none.    Subjective: Sleepy this morning  Objective: Vitals:   01/11/21 0500 01/11/21 1345  01/11/21 2024 01/12/21 0609  BP:  129/81 (!) 153/74 (!) 143/58  Pulse:  95 92 (!) 107  Resp:  (!) '21 16 18  '$ Temp:  98 F (36.7 C) 98 F (36.7 C) 97.8 F (36.6 C)  TempSrc:  Oral Oral Oral  SpO2:  100% 97% 97%  Weight: 107.7 kg   111.4 kg    Intake/Output Summary (Last 24 hours) at  01/12/2021 1112 Last data filed at 01/12/2021 0346 Gross per 24 hour  Intake 620 ml  Output 900 ml  Net -280 ml    Filed Weights   01/10/21 0500 01/11/21 0500 01/12/21 0609  Weight: 106.9 kg 107.7 kg 111.4 kg    Examination:  General exam: Ill-appearing lady, with NG tube connected to suction Respiratory system: Diminished air entry at bases, on room air Cardiovascular system: S1-S2 heard, regular rate rhythm, pedal edema present Gastrointestinal system: Abdomen is soft, distended, bowel sounds minimal, mildly tender Central nervous system: Alert, able to answer simple questions Extremities: pedal edema present.  Skin: No rashes seen. Psychiatry: Sleepy from [place]: I have personally reviewed following labs and imaging studies  CBC: Recent Labs  Lab 01/06/21 0458 01/11/21 0300  WBC 8.6 5.1  NEUTROABS 6.2 4.3  HGB 10.6* 8.8*  HCT 32.8* 28.3*  MCV 89.9 92.5  PLT 235 131*     Basic Metabolic Panel: Recent Labs  Lab 01/08/21 0756 01/09/21 0429 01/10/21 0454 01/11/21 0300 01/12/21 0439  NA 138 139 139 135 142  K 4.6 4.8 4.9 4.8 5.3*  CL 111 110 110 109 116*  CO2 '23 22 23 23 22  '$ GLUCOSE 138* 178* 166* 146* 162*  BUN 25* 33* 41* 43* 47*  CREATININE 0.99 0.85 0.87 0.81 0.78  CALCIUM 8.8* 9.0 9.0 8.7* 9.1  MG 2.2 2.2 2.2 2.1 2.3  PHOS 2.9 3.2 3.6 3.8 4.2     GFR: Estimated Creatinine Clearance: 82.1 mL/min (by C-G formula based on SCr of 0.78 mg/dL).  Liver Function Tests: Recent Labs  Lab 01/06/21 0458 01/07/21 0647 01/09/21 0429 01/12/21 0439  AST 11* 15 14* 23  ALT '9 10 11 20  '$ ALKPHOS 71 66 57 72  BILITOT 0.4 0.3 0.2* 0.4  PROT 5.4* 5.5* 5.1* 4.8*  ALBUMIN 2.3* 2.2* 2.1* 2.0*     CBG: Recent Labs  Lab 01/11/21 0455 01/11/21 1223 01/11/21 1708 01/11/21 2351 01/12/21 0616  GLUCAP 138* 126* 118* 137* 148*      Recent Results (from the past 240 hour(s))  Urine Culture     Status: Abnormal   Collection Time: 01/02/21  12:35 PM   Specimen: Urine, Clean Catch  Result Value Ref Range Status   Specimen Description   Final    URINE, CLEAN CATCH Performed at [facility], [place] [address], [place], [place] [address]    Special Requests   Final    NONE Performed at [facility], [place] [address] [place], [place] [address]    Culture (A)  Final    <10,000 COLONIES/mL INSIGNIFICANT GROWTH Performed at [place] [address], [place], [place] [address]    Report Status 01/04/2021 FINAL  Final  Culture, blood (routine x 2)     Status: None   Collection Time: 01/02/21  9:02 PM   Specimen: Left Antecubital; Blood  Result Value Ref Range Status   Specimen Description   Final    LEFT ANTECUBITAL Performed at [place]  [address], [place], [place] [address]    Special Requests   Final    BOTTLES DRAWN AEROBIC ONLY Blood Culture results may not be optimal due to an inadequate volume of blood received in culture bottles Performed at [place] [address] [place], [place] [address]    Culture   Final    NO GROWTH 5 DAYS Performed at [facility], [place] [address] [place], [place] [address]    Report Status 01/08/2021 FINAL  Final          Radiology Studies: DG Abd 2 Views  Result Date: 01/12/2021 CLINICAL DATA:  Follow-up bowel obstruction EXAM: ABDOMEN - 2 VIEW COMPARISON:  KUB dated 1 day prior FINDINGS: Enteric catheter is in place with the tip and side hole projecting over the stomach. A right chest wall port is in place with the tip in the lower SVC. There is a nonobstructive bowel gas pattern. Right upper quadrant and pelvic surgical clips are noted. There is a trace left pleural effusion. There is no acute osseous abnormality. IMPRESSION: 1. Enteric catheter tip and side hole in the stomach. 2. Nonobstructive bowel gas pattern. 3. Trace left pleural effusion Electronically Signed   By: Valetta Mole M.D.   On: 01/12/2021 10:06   DG Abd Portable 1V  Result Date: 01/11/2021 CLINICAL DATA:  Bowel obstruction. EXAM: PORTABLE ABDOMEN - 1 VIEW COMPARISON:  01/08/2021 and CT abdomen pelvis 01/02/2021. FINDINGS: Image quality is degraded by body habitus. Nasogastric tube terminates in the stomach. Gas is seen in nondilated small bowel and some colon. Surgical clips in the right upper quadrant. IMPRESSION: No evidence of small-bowel obstruction. Electronically Signed   By: Lorin Picket M.D.   On: 01/11/2021 08:26        Scheduled Meds:  acetaminophen  650 mg Oral Once   Chlorhexidine Gluconate Cloth  6 each Topical Daily   cloNIDine  0.1 mg Transdermal Weekly   enoxaparin (LOVENOX) injection  40 mg Subcutaneous Q24H   feeding supplement  1 Container Oral TID BM   insulin aspart  0-9 Units Subcutaneous Q6H   magic mouthwash w/lidocaine  5 mL Oral QID   pantoprazole (PROTONIX) IV  40 mg Intravenous Q24H   sodium chloride flush  10-40 mL Intracatheter Q12H   Continuous Infusions:  TPN ADULT (ION)     TPN ADULT (ION)       LOS: 10 days        Hosie Poisson, MD [facility]   To contact the attending provider between 7A-7P or the covering provider during after hours 7P-7A, please log into the web site [email or web address] and access using universal [place] password for that web site. If you do not have the password, please call the hospital operator.  01/12/2021, 11:12 AM

## 2021-01-12 NOTE — Progress Notes (Signed)
Physical Therapy Treatment Patient Details Name: Amanda Lin MRN: [id number] DOB: 08/09/1955 Today's Date: 01/12/2021    History of Present Illness 65 y.o. female  who presents to the emergency department on 01/02/2021 with abdominal pain and vomiting. Dx of UTI, ileus, hydronephrosis. Pt with medical history significant for endometrial cancer (diagnosed January 2022), diabetes, hypertension, COVID infection early July 2022.    PT Comments    Pt is somewhat lethargic, likely due to having ativan earlier today. Pt performed BUE/LE strengthening exercises while in recliner. Sit to stand with moderate assistance, pt stood for ~45 seconds, tolerance limited by fatigue and B foot pain. This is an overall decline in activity tolerance, she ambulated 18' on 01/10/21.     Follow Up Recommendations  Home health PT;Supervision for mobility/OOB     Equipment Recommendations  None recommended by PT    Recommendations for Other Services       Precautions / Restrictions Precautions Precautions: Fall Precaution Comments: central port, denies h/o falls in past 6 months    Mobility  Bed Mobility               General bed mobility comments: up in recliner    Transfers Overall transfer level: Needs assistance Equipment used: Rolling walker (2 wheeled) Transfers: Sit to/from Stand           General transfer comment: Increased time/effort. Mod A to power up. VC for forward momentum and hand placement. Pt stood for ~45 seconds with RW, tolerance limited by pain and fatigue. Attempted marching in standing, but pt too fatigued.  Ambulation/Gait             General Gait Details: unable   Stairs             Wheelchair Mobility    Modified Rankin (Stroke Patients Only)       Balance Overall balance assessment: Needs assistance Sitting-balance support: Feet supported Sitting balance-Leahy Scale: Fair     Standing balance support: Bilateral upper extremity  supported Standing balance-Leahy Scale: Poor Standing balance comment: with rolling walker. relies on BUE support                            Cognition Arousal/Alertness: Awake/alert Behavior During Therapy: WFL for tasks assessed/performed Overall Cognitive Status: Within Functional Limits for tasks assessed                                        Exercises General Exercises - Lower Extremity Ankle Circles/Pumps: AROM;Both;15 reps;Seated Long Arc Quad: AROM;Both;10 reps;Seated;AAROM Heel Slides: AROM;Both;10 reps;Seated Shoulder Exercises Shoulder Flexion: AROM;Both;10 reps;Seated    General Comments        Pertinent Vitals/Pain Pain Assessment: Faces Pain Score: 4  Faces Pain Scale: Hurts even more Pain Location: L knee (Simultaneous filing. User may not have seen previous data.) Pain Descriptors / Indicators: Constant;Guarding;Grimacing (Simultaneous filing. User may not have seen previous data.) Pain Intervention(s): Monitored during session;Premedicated before session (Simultaneous filing. User may not have seen previous data.)    Home Living                      Prior Function            PT Goals (current goals can now be found in the care plan section) Acute Rehab PT Goals Patient Stated Goal: increase  endurance PT Goal Formulation: With patient Time For Goal Achievement: 01/24/21 Potential to Achieve Goals: Good Progress towards PT goals: Not progressing toward goals - comment (pain and fatigue are limiting progress)    Frequency    Min 3X/week      PT Plan Current plan remains appropriate    Co-evaluation              AM-PAC PT "6 Clicks" Mobility   Outcome Measure  Help needed turning from your back to your side while in a flat bed without using bedrails?: A Little Help needed moving from lying on your back to sitting on the side of a flat bed without using bedrails?: A Lot Help needed moving to and from  a bed to a chair (including a wheelchair)?: A Lot Help needed standing up from a chair using your arms (e.g., wheelchair or bedside chair)?: A Lot Help needed to walk in hospital room?: A Lot Help needed climbing 3-5 steps with a railing? : A Lot 6 Click Score: 13    End of Session Equipment Utilized During Treatment: Gait belt Activity Tolerance: Patient limited by fatigue;Patient limited by pain Patient left: in chair;with call bell/phone within reach;with family/visitor present;with nursing/sitter in room Nurse Communication: Mobility status PT Visit Diagnosis: Difficulty in walking, not elsewhere classified (R26.2)     Time: [id number] PT Time Calculation (min) (ACUTE ONLY): 15 min  Charges:  $Therapeutic Activity: 8-22 mins                    Blondell Reveal Kistler PT 01/12/2021  Acute Rehabilitation Services Pager [phone] Office [phone]

## 2021-01-13 DIAGNOSIS — G893 Neoplasm related pain (acute) (chronic): Secondary | ICD-10-CM | POA: Diagnosis not present

## 2021-01-13 DIAGNOSIS — N3001 Acute cystitis with hematuria: Secondary | ICD-10-CM | POA: Diagnosis not present

## 2021-01-13 DIAGNOSIS — C541 Malignant neoplasm of endometrium: Secondary | ICD-10-CM | POA: Diagnosis not present

## 2021-01-13 DIAGNOSIS — E876 Hypokalemia: Secondary | ICD-10-CM | POA: Diagnosis not present

## 2021-01-13 LAB — GLUCOSE, CAPILLARY
Glucose-Capillary: 113 mg/dL — ABNORMAL HIGH (ref 70–99)
Glucose-Capillary: 118 mg/dL — ABNORMAL HIGH (ref 70–99)
Glucose-Capillary: 119 mg/dL — ABNORMAL HIGH (ref 70–99)
Glucose-Capillary: 136 mg/dL — ABNORMAL HIGH (ref 70–99)

## 2021-01-13 LAB — BASIC METABOLIC PANEL
Anion gap: 8 (ref 5–15)
BUN: 38 mg/dL — ABNORMAL HIGH (ref 8–23)
CO2: 20 mmol/L — ABNORMAL LOW (ref 22–32)
Calcium: 8.6 mg/dL — ABNORMAL LOW (ref 8.9–10.3)
Chloride: 112 mmol/L — ABNORMAL HIGH (ref 98–111)
Creatinine, Ser: 0.7 mg/dL (ref 0.44–1.00)
GFR, Estimated: >60 mL/min (ref >60)
Glucose, Bld: 141 mg/dL — ABNORMAL HIGH (ref 70–99)
Potassium: 4.3 mmol/L (ref 3.5–5.1)
Sodium: 140 mmol/L (ref 135–145)

## 2021-01-13 LAB — MAGNESIUM: Magnesium: 1.9 mg/dL (ref 1.7–2.4)

## 2021-01-13 LAB — PHOSPHORUS: Phosphorus: 3.5 mg/dL (ref 2.5–4.6)

## 2021-01-13 MED ORDER — POTASSIUM PHOSPHATES 150 MMOLE/50ML IV SOLN
INTRAVENOUS | Status: AC
  Filled 2021-01-13: qty 614.4

## 2021-01-13 MED ORDER — TRACE MINERALS CU-MN-SE-ZN 300-55-60-3000 MCG/ML IV SOLN: INTRAVENOUS | Status: AC

## 2021-01-13 NOTE — Progress Notes (Signed)
PHARMACY - TOTAL PARENTERAL NUTRITION CONSULT NOTE   Indication: Prolonged ileus  Patient Measurements: Weight: 103.4 kg (227 lb 15.3 oz)   Body mass index is 43.07 kg/m. Usual Weight:   Assessment: Patient is a 64 y.o F with endometrial cancer presented to the ED on 01/02/21 with c/o abdominal pain and n/v.  Abd CT on 8/29 showed findings consistent with ileus and disease progress. Plan is to start chemotherapy with this admission and to start TPN on 9/2 for ileus.  01/13/21 7:13 AM Fluid and TPN goal reduced 9/8 d/t fluid overload, patient edematous Urine output better today, BUN trending down, however bicarb low today despite increasing acetate content yesterday  Glucose / Insulin:  - cbgs (goal <150): range 116-148 - 2 units of insulin given in past 24 hours  Electrolytes:  K now WNL, corrCa 10.2 - goal K >4, mag >2 for ileus Renal: scr < 1, BUN remains elevated but improving, UOP imrpoving, bicarb low at 20 despite change Cl:Ac 1:2 yesterday Hepatic: LFT WNL (9/8), TBili wnl (9/8). Triglycerides 108 ( 9/3), 156 (9/5)  Intake / Output; MIVF:  - no MIVF - I/O: 300 ml NG output, UOP 900 ml yesterday. Looks like input inaccurate (TPN volume not crossing over to flowsheet for some reason past 2 days) but still net + around 8 L this admission GI Imaging: - 8/29 abd CT: Interval progression of ascites with progression of omental soft tissue nodularity now demonstrating areas of confluent caking. Small bowel is mildly distended and fluid-filled up to 3 cm diameter and diffusely fluid-filled. Imaging features are compatible with ileus. Interval worsening of mild right and moderate left hydronephrosis with decreased perfusion to the left kidney compatible with obstructive uropathy GI Surgeries / Procedures:  - has NG tube  Central access: port-a-cath TPN start date: 01/06/21  Nutritional Goals: Goal TPN rate is  100 mL/hr (provides 120 g of protein and 2352 kcals per day)  New 80%  protein/calorie goal rate 60 ml/hr providing 92 g protein and 1829 kcal  RD Assessment (01/06/21): Kcal:  [id number] kcal Protein:  95-110 gm (RD modified 9/8) Fluid:   2 L/day  80% goals 1828- 2000 kcal, 76-88 g protein  Current Nutrition:  Clear liquids  Plan:  Continue TPN at reduced rate of 60 ml/hr and target 80% of calorie and protein goals discussed with Dr. Alvy Bimler and Dr. Karleen Hampshire TPN @ 60 ml/hr will provide 92 g protein and 1829 kcal Add K and phos back conservatively, increase Mg back Electrolytes in TPN: Na 35 mEq/L, K  30 mEq/L, Ca 81mq/L, Mg 8 mEq/L, and Phos 5 mmol/L. Change Cl:Ac to max Ac Add standard MVI and trace elements to TPN Continue  Sensitive q6h SSI and adjust as needed  No MIVF Monitor TPN labs on Mon/Thurs BMET, Mg, and Phos in AM   CUlice DashD  01/13/2021 7:03 AM

## 2021-01-13 NOTE — Plan of Care (Signed)
  Problem: Education: Goal: Knowledge of General Education information will improve Description: Including pain rating scale, medication(s)/side effects and non-pharmacologic comfort measures Outcome: Progressing   Problem: Clinical Measurements: Goal: Diagnostic test results will improve Outcome: Progressing   

## 2021-01-13 NOTE — Progress Notes (Signed)
PROGRESS NOTE    Amanda Lin  [id number] DOB: 09/03/1955 DOA: 01/02/2021 PCP: Nicolette Bang, MD    Chief Complaint  Patient presents with   Abdominal Pain   Constipation    Brief Narrative:  65 year old lady with prior history of endometrial cancer, diabetes, hypertension and COVID-19 infection presents with abdominal pain and nausea and vomiting for about 3 weeks.  CT of the abdomen pelvis shows ileus and possible evolving bowel obstruction, progression of ascites and omental soft tissue nodularity, worsening bilateral hydronephrosis with obstructive uropathy.  An NG tube was placed discontinued on 01/04/2021 had recurrent small bowel distention with reinsertion of the NG tube on 01/05/2021.  TPN was initiated on 01/06/2021. Patient seen and examined this morning she appears to be anxious for which she was given a small dose of Ativan.  She also appears to be fluid overloaded.  Oncology on board and she underwent palliative chemotherapy. Recommend to monitor-2 to 3 weeks postchemotherapy and watch for any improvement and if no improvement plan to transition to comfort measures. Pt seen and examined at bedside, no new complaints.   Assessment & Plan:   Principal Problem:   Endometrial carcinoma ([place]) Active Problems:   Cancer associated pain   Nausea with vomiting   Hypokalemia   SBO (small bowel obstruction) ([place])   Acute cystitis with hematuria   Obstructive uropathy   Palliative care by specialist   Stage 3a chronic kidney disease ([place])   Severe protein-calorie malnutrition ([place])   Possible small bowel obstruction S/p NG tube placement KUB on 9 4/22 shows improvement oncology recommends keeping NG tube until later this week, until she takes regular food  Continue with TPN for now. Patient denies any nausea, vomiting today, no  abdominal distention today.  Patient recommended to ambulate every 4 hours while awake.    Progressive endometrial  carcinoma Oncology on board, started palliative chemotherapy Plan for comfort measures if no improvement in the next 2 weeks. Patient has vaginal serosanguineous discharge/bleeding today. Hemoglobin around 8.     Bilateral hydronephrosis left greater than right supportive care as long as renal function remains stable.  Urology on board   Essential hypertension Blood pressure parameters slightly elevated. Will add prn hydralazine.    Stage III CKD/azotemia  Creatinine back to baseline.    Sore throat probably from the NG tube Improved with Magic mouthwash. GERD  Hypokalemia and hypomagnesemia Replaced.   Acute urinary tract infection Urine analysis with pyuria urine culture shows insignificant growth Patient completed 3 doses of IV Rocephin empirically.  Hyperkalemia Resolved.   Anxiety As needed Ativan ordered  DVT prophylaxis: (Lovenox/) Code Status: (Full code) Family Communication:  Disposition:   Status is: Inpatient  Remains inpatient appropriate because:Unsafe d/c plan and IV treatments appropriate due to intensity of illness or inability to take PO  Dispo: The patient is from: Home              Anticipated d/c is to: Home              Patient currently is not medically stable to d/c.   Difficult to place patient No       Consultants:  Oncology Dr Alvy Bimler.   Procedures: none.   Antimicrobials: none.    Subjective: No new complaints.   Objective: Vitals:   01/12/21 2241 01/12/21 2358 01/13/21 0437 01/13/21 1309  BP: (!) 180/61 (!) 145/62 (!) 178/72 (!) 153/69  Pulse: 79 78 92 81  Resp:  19 20 20  Temp:  97.8 F (36.6 C) 97.6 F (36.4 C) 97.6 F (36.4 C)  TempSrc:  Oral Oral Oral  SpO2:  98% 99% 99%  Weight:   103.4 kg     Intake/Output Summary (Last 24 hours) at 01/13/2021 1554 Last data filed at 01/13/2021 0447 Gross per 24 hour  Intake 624.96 ml  Output 1200 ml  Net -575.04 ml    Filed Weights   01/11/21 0500 01/12/21 0609  01/13/21 0437  Weight: 107.7 kg 111.4 kg 103.4 kg    Examination:  General exam: Ill-appearing lady, sleepy, denies any new complaints.  Respiratory system: diminished air entry at bases, no wheezing heard.  Cardiovascular system: S1-S2 heard, RRR, NO JVD, no pedal edema.  Gastrointestinal system: Abdomen is soft, NT mildly distended, bowel sounds wnl.  Central nervous system: somnolent ,  Extremities: pedal edema present.  Skin: No rashes seen.  Psychiatry: Sleepy from [place]: I have personally reviewed following labs and imaging studies  CBC: Recent Labs  Lab 01/11/21 0300 01/12/21 1756  WBC 5.1 4.0  NEUTROABS 4.3  --   HGB 8.8* 8.1*  HCT 28.3* 25.5*  MCV 92.5 90.7  PLT 131* 123*     Basic Metabolic Panel: Recent Labs  Lab 01/09/21 0429 01/10/21 0454 01/11/21 0300 01/12/21 0439 01/12/21 1756 01/13/21 0532  NA 139 139 135 142 141 140  K 4.8 4.9 4.8 5.3* 5.0 4.3  CL 110 110 109 116* 115* 112*  CO2 '22 23 23 22 23 '$ 20*  GLUCOSE 178* 166* 146* 162* 121* 141*  BUN 33* 41* 43* 47* 46* 38*  CREATININE 0.85 0.87 0.81 0.78 0.88 0.70  CALCIUM 9.0 9.0 8.7* 9.1 9.1 8.6*  MG 2.2 2.2 2.1 2.3  --  1.9  PHOS 3.2 3.6 3.8 4.2  --  3.5     GFR: Estimated Creatinine Clearance: 78.5 mL/min (by C-G formula based on SCr of 0.7 mg/dL).  Liver Function Tests: Recent Labs  Lab 01/07/21 0647 01/09/21 0429 01/12/21 0439  AST 15 14* 23  ALT '10 11 20  '$ ALKPHOS 66 57 72  BILITOT 0.3 0.2* 0.4  PROT 5.5* 5.1* 4.8*  ALBUMIN 2.2* 2.1* 2.0*     CBG: Recent Labs  Lab 01/12/21 1247 01/12/21 1748 01/12/21 2337 01/13/21 0440 01/13/21 1208  GLUCAP 134* 128* 116* 118* 119*      No results found for this or any previous visit (from the past 240 hour(s)).        Radiology Studies: DG Abd 2 Views  Result Date: 01/12/2021 CLINICAL DATA:  Follow-up bowel obstruction EXAM: ABDOMEN - 2 VIEW COMPARISON:  KUB dated 1 day prior FINDINGS: Enteric catheter is  in place with the tip and side hole projecting over the stomach. A right chest wall port is in place with the tip in the lower SVC. There is a nonobstructive bowel gas pattern. Right upper quadrant and pelvic surgical clips are noted. There is a trace left pleural effusion. There is no acute osseous abnormality. IMPRESSION: 1. Enteric catheter tip and side hole in the stomach. 2. Nonobstructive bowel gas pattern. 3. Trace left pleural effusion Electronically Signed   By: Valetta Mole M.D.   On: 01/12/2021 10:06        Scheduled Meds:  acetaminophen  650 mg Oral Once   Chlorhexidine Gluconate Cloth  6 each Topical Daily   cloNIDine  0.1 mg Transdermal Weekly   enoxaparin (LOVENOX) injection  40 mg Subcutaneous Q24H   feeding  supplement  1 Container Oral TID BM   insulin aspart  0-9 Units Subcutaneous Q6H   magic mouthwash w/lidocaine  5 mL Oral QID   pantoprazole (PROTONIX) IV  40 mg Intravenous Q24H   sodium chloride flush  10-40 mL Intracatheter Q12H   Continuous Infusions:  TPN ADULT (ION)     TPN ADULT (ION)       LOS: 11 days        Hosie Poisson, MD [facility]   To contact the attending provider between 7A-7P or the covering provider during after hours 7P-7A, please log into the web site [email or web address] and access using universal [place] password for that web site. If you do not have the password, please call the hospital operator.  01/13/2021, 3:54 PM

## 2021-01-13 NOTE — Progress Notes (Signed)
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 She had NG tube replaced due to persistent signs and symptoms of carcinomatosis secondary to recurrent endometrial cancer She has completed 3 days course of IV antibiotics for UTI She had received carboplatin & Taxol & Herceptin last week on January 06, 2021 I do not anticipate clinical benefit for 5 to 7 days Recommend continue supportive care for the next 2 weeks If she has no improvement in the next 2 weeks, then we will be transitioning her care to comfort measures   Cancer associated pain She has multifactorial pain I recommend pain medicine as needed   Nausea with vomiting/ileus secondary to abdominal carcinomatosis She continues to intermittent nausea Recommend IV fluids and antiemetics as needed Abdominal x-ray obtained 9/8 which shows nonobstructive gas pattern However, symptoms seem to be worse Recommend NG tube be placed back on suction starting 9/8.  This morning, her abdomen felt less distended and soft I recommend we continue NG tube on suction over the weekend and reattempt clamping next week   Physical deconditioning She has generalized weakness and is inclined not to do therapy because of pain and weakness I encouraged the patient to continue to participate with physical therapy while on treatment   Severe protein calorie malnutrition Continue TPN   Acquired pancytopenia Likely due to recent chemo She does not need transfusion support Observe closely I anticipate her pancytopenia will get worse over the weekend Consider transfusion if hemoglobin less than 7 without symptoms all less than 8 if she is symptomatic   Hydronephrosis Monitor renal function closely I spoke with urologist We can technically monitor this closely for now   UTI, adequately treated   Severe constipation Secondary to carcinomatosis    Goals  of care Resolution of ileus, nausea vomiting inability to eat 80% of required calorie intake by mouth I reviewed palliative intent of treatment with the patient and her daughter and they are willing to try She is aware, if despite chemotherapy, that if she did not improve within 2 to 3 weeks time, her condition will be considered terminal at that point in time   Discharge planning She will likely be here for 5 to 7 days I will return to check on her next Monday Please contact consult service on call if questions arise  All questions were answered. The patient knows to call the clinic with any problems, questions or concerns.   The total time spent in the appointment was 25 minutes encounter with patients including review of chart and various tests results, discussions about plan of care and coordination of care plan  [name], MD 01/13/2021 7:38 AM  Subjective:  She is seen this morning.  Daughter is not available by the bedside She felt that her abdomen distention, nausea and pain is slightly improved She complained of leg swelling and discomfort No bowel movement for over a week  Objective:  Vitals:   01/12/21 2358 01/13/21 0437  BP: (!) 145/62 (!) 178/72  Pulse: 78 92  Resp: 19 20  Temp: 97.8 F (36.6 C) 97.6 F (36.4 C)  SpO2: 98% 99%     Intake/Output Summary (Last 24 hours) at 01/13/2021 0738 Last data filed at 01/13/2021 0447 Gross per 24 hour  Intake 787.96 ml  Output 1200 ml  Net -412.04 ml    GENERAL:alert, no distress and comfortable SKIN: skin color, texture, turgor are normal, no rashes  or significant lesions EYES: normal, Conjunctiva are pink and non-injected, sclera clear OROPHARYNX:no exudate, no erythema and lips, buccal mucosa, and tongue normal  NECK: supple, thyroid normal size, non-tender, without nodularity LYMPH:  no palpable lymphadenopathy in the cervical, axillary or inguinal LUNGS: clear to auscultation and percussion with normal breathing  effort HEART: regular rate & rhythm and no murmurs with profound bilateral lower extremity edema ABDOMEN:abdomen soft, less distended.  Softer than yesterday Musculoskeletal:no cyanosis of digits and no clubbing  NEURO: alert & oriented x 3 with fluent speech, no focal motor/sensory deficits   Labs:  Recent Labs    02/23/20 1113 02/26/20 1007 01/07/21 0647 01/08/21 0756 01/09/21 0429 01/10/21 0454 01/12/21 0439 01/12/21 1756 01/13/21 0532  NA 138   < > 137   < > 139   < > 142 141 140  K 4.2   < > 4.6   < > 4.8   < > 5.3* 5.0 4.3  CL 102   < > 112*   < > 110   < > 116* 115* 112*  CO2 19*   < > 22   < > 22   < > 22 23 20*  GLUCOSE 138*   < > 190*   < > 178*   < > 162* 121* 141*  BUN 15   < > 15   < > 33*   < > 47* 46* 38*  CREATININE 0.80   < > 0.97   < > 0.85   < > 0.78 0.88 0.70  CALCIUM 9.6   < > 8.7*   < > 9.0   < > 9.1 9.1 8.6*  GFRNONAA 79   < > >60   < > >60   < > >60 >60 >60  GFRAA 91  --   --   --   --   --   --   --   --   PROT 7.8   < > 5.5*  --  5.1*  --  4.8*  --   --   ALBUMIN 4.4   < > 2.2*  --  2.1*  --  2.0*  --   --   AST 14   < > 15  --  14*  --  23  --   --   ALT 19   < > 10  --  11  --  20  --   --   ALKPHOS 115   < > 66  --  57  --  72  --   --   BILITOT 0.6   < > 0.3  --  0.2*  --  0.4  --   --    < > = values in this interval not displayed.    Studies:  DG Abd 1 View  Result Date: 01/03/2021 CLINICAL DATA:  Ileus EXAM: ABDOMEN - 1 VIEW COMPARISON:  Portable exam 0848 hours compared to 01/02/2021 FINDINGS: Tip of nasogastric tube projects over stomach. Excreted contrast material within urinary bladder with faint contrast bilaterally in dilated renal collecting systems. Single nonspecific air-filled nondistended small bowel loops in mid abdomen. Bowel gas pattern otherwise normal. Osseous structures unremarkable. Surgical clips RIGHT upper quadrant likely reflect prior cholecystectomy. IMPRESSION: BILATERAL hydronephrosis. Normal bowel gas pattern.  Electronically Signed   By: Lavonia Dana M.D.   On: 01/03/2021 10:44   CT ABDOMEN PELVIS W CONTRAST  Result Date: 01/02/2021 CLINICAL DATA:  Endometrial cancer.  Bowel obstruction suspected. EXAM: CT ABDOMEN  AND PELVIS WITH CONTRAST TECHNIQUE: Multidetector CT imaging of the abdomen and pelvis was performed using the standard protocol following bolus administration of intravenous contrast. CONTRAST:  29m OMNIPAQUE IOHEXOL 350 MG/ML SOLN COMPARISON:  11/02/2020 FINDINGS: Lower chest: Basilar atelectasis. Hepatobiliary: No suspicious focal abnormality within the liver parenchyma. Gallbladder is surgically absent. No intrahepatic or extrahepatic biliary dilation. Pancreas: No focal mass lesion. No dilatation of the main duct. No intraparenchymal cyst. No peripancreatic edema. Spleen: No splenomegaly. No focal mass lesion. Adrenals/Urinary Tract: No adrenal nodule or mass. Mild right and moderate left hydronephrosis evident with decreased perfusion to the left kidney. 1.3 cm lesion lower pole right kidney, previously characterized as hemorrhagic cyst. More subtle enhancing lesion identified anterior lower pole right kidney, previously characterized as suspicious for neoplasm on CT without and with contrast dated 03/28/2020. Both ureters are dilated down to the level of the pelvic sidewall. Bladder is unremarkable. Stomach/Bowel: Stomach is unremarkable. No gastric wall thickening. No evidence of outlet obstruction. Duodenum is normally positioned as is the ligament of Treitz. No small bowel wall thickening. Small bowel is mildly distended up to 3 cm diameter and diffusely fluid-filled. The terminal ileum is normal. The appendix is normal. No gross colonic mass. No colonic wall thickening. Vascular/Lymphatic: There is mild atherosclerotic calcification of the abdominal aorta without aneurysm. Small lymph nodes are seen in the gastrohepatic and hepatoduodenal ligament. Small lymph nodes evident in the retroperitoneal  space of the abdomen along the pelvic sidewall bilaterally. Reproductive: Calcified fibroid noted in the uterus. Endometrium not well visualized. There is no adnexal mass. Other: Interval progression ascites with progression of omental soft tissue nodularity now demonstrating areas of confluent "caking" (see midline image 46/2). Enhancing peritoneal nodules are seen adjacent to the inferior liver (35/2) and in the right pelvis (63/2). There are enhancing mesenteric nodules (see central mesentery on 52/2) with soft tissue nodularity in the region of the umbilicus measuring 2.3 x 2.2 cm on image 54/2. Moderate volume ascites seen around the liver and spleen and along the stomach with moderate volume ascites in the pelvis is well. Musculoskeletal: No worrisome lytic or sclerotic osseous abnormality. IMPRESSION: 1. Interval progression of ascites with progression of omental soft tissue nodularity now demonstrating areas of confluent caking. Peritoneal nodularity is also progressive in the interval. 2. Small bowel is mildly distended and fluid-filled up to 3 cm diameter and diffusely fluid-filled. Imaging features are compatible with ileus. Evolving obstruction not entirely excluded. 3. Interval worsening of mild right and moderate left hydronephrosis with decreased perfusion to the left kidney compatible with obstructive uropathy. Both ureters are obstructed at the level of the pelvic sidewall. 4. Subtle enhancing lesion anterior lower pole right kidney, previously characterized as neoplasm on CT without and with contrast dated 03/28/2020. 5. Small lymph nodes in the gastrohepatic and hepatoduodenal ligament, in the retroperitoneal space of the abdomen, and along the pelvic sidewall bilaterally. 6. Calcified fibroid in the uterus. Endometrium not well visualized. 7. Aortic Atherosclerosis (ICD10-I70.0). Electronically Signed   By: EMisty StanleyM.D.   On: 01/02/2021 11:56   DG Abd 2 Views  Result Date:  01/12/2021 CLINICAL DATA:  Follow-up bowel obstruction EXAM: ABDOMEN - 2 VIEW COMPARISON:  KUB dated 1 day prior FINDINGS: Enteric catheter is in place with the tip and side hole projecting over the stomach. A right chest wall port is in place with the tip in the lower SVC. There is a nonobstructive bowel gas pattern. Right upper quadrant and pelvic surgical clips are noted.  There is a trace left pleural effusion. There is no acute osseous abnormality. IMPRESSION: 1. Enteric catheter tip and side hole in the stomach. 2. Nonobstructive bowel gas pattern. 3. Trace left pleural effusion Electronically Signed   By: Valetta Mole M.D.   On: 01/12/2021 10:06   DG Abd 2 Views  Result Date: 01/05/2021 CLINICAL DATA:  Abdomen pain and emesis EXAM: ABDOMEN - 2 VIEW COMPARISON:  01/03/2021, CT 01/02/2021 FINDINGS: Trace pleural effusion left lung base. No free air beneath the diaphragm. Increased small bowel distension, measuring up to 4.1 cm with multiple fluid levels. Paucity of distal gas. IMPRESSION: 1. Increased small bowel distension with multiple fluid levels and paucity of distal gas, suspect for bowel obstruction Electronically Signed   By: Donavan Foil M.D.   On: 01/05/2021 16:34   DG Abd 2 Views  Result Date: 12/22/2020 CLINICAL DATA:  Right lower quadrant abdominal pain with nausea, vomiting, and constipation. History of endometrial cancer. EXAM: ABDOMEN - 2 VIEW COMPARISON:  None. FINDINGS: The bowel gas pattern is normal. There is no evidence of free air. No radio-opaque calculi or other significant radiographic abnormality is seen. Surgical clips in the right upper quadrant and pelvis. No acute osseous abnormality. IMPRESSION: Negative. Electronically Signed   By: Titus Dubin M.D.   On: 12/22/2020 12:03   DG Abd Portable 1V  Result Date: 01/11/2021 CLINICAL DATA:  Bowel obstruction. EXAM: PORTABLE ABDOMEN - 1 VIEW COMPARISON:  01/08/2021 and CT abdomen pelvis 01/02/2021. FINDINGS: Image quality is  degraded by body habitus. Nasogastric tube terminates in the stomach. Gas is seen in nondilated small bowel and some colon. Surgical clips in the right upper quadrant. IMPRESSION: No evidence of small-bowel obstruction. Electronically Signed   By: Lorin Picket M.D.   On: 01/11/2021 08:26   DG Abd Portable 1V  Result Date: 01/08/2021 CLINICAL DATA:  Nasogastric tube adjustment EXAM: PORTABLE ABDOMEN - 1 VIEW COMPARISON:  01/05/2021 FINDINGS: Nasogastric tube placement to the decompressed stomach. A few gas-filled nondilated small bowel loops in the mid abdomen. Colon decompressed. Cholecystectomy clips. IMPRESSION: Nasogastric tube to decompressed stomach. Electronically Signed   By: Lucrezia Europe M.D.   On: 01/08/2021 15:30   DG Abd Portable 1 View  Result Date: 01/02/2021 CLINICAL DATA:  Check gastric catheter placement EXAM: PORTABLE ABDOMEN - 1 VIEW COMPARISON:  None. FINDINGS: Gastric catheter is noted extending into the stomach. The proximal side port lies just beyond the gastroesophageal junction and could be advanced a few cm as necessary. Visualized lungs are hypoinflated but clear. Right chest wall port is noted. IMPRESSION: Gastric catheter in the stomach as described. Electronically Signed   By: Inez Catalina M.D.   On: 01/02/2021 14:01   [place] EKG SITE RITE  Result Date: 01/08/2021 If Site Rite image not attached, placement could not be confirmed due to current cardiac rhythm.

## 2021-01-14 DIAGNOSIS — C541 Malignant neoplasm of endometrium: Secondary | ICD-10-CM | POA: Diagnosis not present

## 2021-01-14 DIAGNOSIS — E876 Hypokalemia: Secondary | ICD-10-CM | POA: Diagnosis not present

## 2021-01-14 DIAGNOSIS — G893 Neoplasm related pain (acute) (chronic): Secondary | ICD-10-CM | POA: Diagnosis not present

## 2021-01-14 DIAGNOSIS — N3001 Acute cystitis with hematuria: Secondary | ICD-10-CM | POA: Diagnosis not present

## 2021-01-14 LAB — BASIC METABOLIC PANEL
Anion gap: 7 (ref 5–15)
BUN: 45 mg/dL — ABNORMAL HIGH (ref 8–23)
CO2: 23 mmol/L (ref 22–32)
Calcium: 9.2 mg/dL (ref 8.9–10.3)
Chloride: 107 mmol/L (ref 98–111)
Creatinine, Ser: 0.75 mg/dL (ref 0.44–1.00)
GFR, Estimated: >60 mL/min (ref >60)
Glucose, Bld: 170 mg/dL — ABNORMAL HIGH (ref 70–99)
Potassium: 4.4 mmol/L (ref 3.5–5.1)
Sodium: 137 mmol/L (ref 135–145)

## 2021-01-14 LAB — GLUCOSE, CAPILLARY
Glucose-Capillary: 158 mg/dL — ABNORMAL HIGH (ref 70–99)
Glucose-Capillary: 137 mg/dL — ABNORMAL HIGH (ref 70–99)
Glucose-Capillary: 104 mg/dL — ABNORMAL HIGH (ref 70–99)

## 2021-01-14 LAB — MAGNESIUM: Magnesium: 2 mg/dL (ref 1.7–2.4)

## 2021-01-14 LAB — PHOSPHORUS: Phosphorus: 3.4 mg/dL (ref 2.5–4.6)

## 2021-01-14 MED ORDER — GABAPENTIN 100 MG PO CAPS
100.0000 mg | ORAL_CAPSULE | Freq: Three times a day (TID) | ORAL | Status: DC
  Administered 2021-01-14 – 2021-01-17 (×9): 100 mg via ORAL
  Filled 2021-01-14 (×10): qty 1

## 2021-01-14 MED ORDER — TRACE MINERALS CU-MN-SE-ZN 300-55-60-3000 MCG/ML IV SOLN
INTRAVENOUS | Status: AC
  Filled 2021-01-14: qty 614.4

## 2021-01-14 NOTE — Progress Notes (Signed)
PHARMACY - TOTAL PARENTERAL NUTRITION CONSULT NOTE   Indication: Prolonged ileus  Patient Measurements: Height: '5\' 1"'$  (154.9 cm) Weight: 101.4 kg (223 lb 8.7 oz) IBW/kg (Calculated) : 47.8   Body mass index is 42.24 kg/m. Usual Weight:   Assessment: Patient is a 65 y.o F with endometrial cancer presented to the ED on 01/02/21 with c/o abdominal pain and n/v.  Abd CT on 8/29 showed findings consistent with ileus and disease progress. Plan is to start chemotherapy with this admission and to start TPN on 9/2 for ileus.  - Onc: Chemo 9/2 with Carbo/Taxol/Herceptin - do not anticipate clinical benefit for 5 to 7 days Recommend continue supportive care for the next 2 weeks If she has no improvement in the next 2 weeks, then we will be transitioning her care to comfort measures  01/14/21 6:51 AM Fluid and TPN goal reduced 9/8 d/t fluid overload, patient edematous Bicarb improved with increased acetate content   Glucose / Insulin:  - cbgs (goal <150): range 113-158 - 4 units of insulin given in past 24 hours  Electrolytes:  K 4.4, Mag 2.0, corrCa 10.8 - goal K >4, mag >2 for ileus Renal: scr < 1, BUN remains elevated but improving Hepatic: LFT WNL (9/8), TBili wnl (9/8). Triglycerides 108 ( 9/3), 156 (9/5)  Intake / Output; MIVF:  - no MIVF - I/O: 300 ml NG output, UOP 300 ml yesterday. Looks like input inaccurate (TPN volume not crossing over to flowsheet for some reason past 2 days) but still net + around 8 L this admission GI Imaging: - 8/29 abd CT: Interval progression of ascites with progression of omental soft tissue nodularity now demonstrating areas of confluent caking. Small bowel is mildly distended and fluid-filled up to 3 cm diameter and diffusely fluid-filled. Imaging features are compatible with ileus. Interval worsening of mild right and moderate left hydronephrosis with decreased perfusion to the left kidney compatible with obstructive uropathy GI Surgeries / Procedures:  -  has NG tube  Central access: port-a-cath TPN start date: 01/06/21  Nutritional Goals: Goal TPN rate is  100 mL/hr (provides 120 g of protein and 2352 kcals per day)  New 80% protein/calorie goal rate 60 ml/hr providing 92 g protein and 1829 kcal  RD Assessment (01/06/21): Kcal:  [id number] kcal Protein:  95-110 gm (RD modified 9/8) Fluid:   2 L/day  80% goals 1828- 2000 kcal, 76-88 g protein  Current Nutrition:  Clear liquids  Plan:  Continue TPN at reduced rate of 60 ml/hr and target 80% of calorie and protein goals discussed with Dr. Alvy Bimler and Dr. Karleen Hampshire TPN @ 60 ml/hr will provide 92 g protein and 1829 kcal Add K and phos back conservatively, increase Mg back Electrolytes in TPN: Na 35 mEq/L, K  30 mEq/L, Ca decr to 2.66mq/L, Mg 8 mEq/L, and Phos 5 mmol/L. Continue Cl:Ac to max Ac Add standard MVI and trace elements to TPN Continue  Sensitive q6h SSI and adjust as needed  No MIVF Monitor TPN labs on Mon/Thurs BMET, Mg, and Phos in AM   GMinda DittoPharmD 01/14/2021 6:51 AM

## 2021-01-14 NOTE — Progress Notes (Signed)
PROGRESS NOTE    Amanda Lin  [id number] DOB: July 29, 1955 DOA: 01/02/2021 PCP: Nicolette Bang, MD    Chief Complaint  Patient presents with   Abdominal Pain   Constipation    Brief Narrative:  65 year old lady with prior history of endometrial cancer, diabetes, hypertension and COVID-19 infection presents with abdominal pain and nausea and vomiting for about 3 weeks.  CT of the abdomen pelvis shows ileus and possible evolving bowel obstruction, progression of ascites and omental soft tissue nodularity, worsening bilateral hydronephrosis with obstructive uropathy.  An NG tube was placed discontinued on 01/04/2021 had recurrent small bowel distention with reinsertion of the NG tube on 01/05/2021.  TPN was initiated on 01/06/2021. Patient seen and examined this morning she appears to be anxious for which she was given a small dose of Ativan.  She also appears to be fluid overloaded.  Oncology on board and she underwent palliative chemotherapy. Recommend to monitor-2 to 3 weeks postchemotherapy and watch for any improvement and if no improvement plan to transition to comfort measures. Pt seen and examined at bedside, she does not seem to be better this morning.  She reports feeling miserable.   Assessment & Plan:   Principal Problem:   Endometrial carcinoma ([place]) Active Problems:   Cancer associated pain   Nausea with vomiting   Hypokalemia   SBO (small bowel obstruction) ([place])   Acute cystitis with hematuria   Obstructive uropathy   Palliative care by specialist   Stage 3a chronic kidney disease ([place])   Severe protein-calorie malnutrition ([place])   Possible small bowel obstruction S/p NG tube placement. Abdominal x ray shows improvement in obstruction.  oncology recommends keeping NG tube until later this week, until she takes regular food  Continue with TPN for now. Patient denies any nausea, vomiting today, no  abdominal distention today.  Patient recommended to  ambulate every 4 hours while awake.   Progressive endometrial carcinoma Oncology on board, started palliative chemotherapy Plan for comfort measures if no improvement in the next 2 weeks. Patient has vaginal serosanguineous discharge/bleeding today.  Hemoglobin around 8.     Bilateral hydronephrosis left greater than right  supportive care as long as renal function remains stable.   Creatinine remains stable.    Essential hypertension Blood pressure parameters are elevated, probably from pain.    Stage III CKD/azotemia  Creatinine back to baseline.    Sore throat probably from the NG tube Improved with Magic mouthwash.  GERD Stable.   Hypokalemia and hypomagnesemia Replaced.   Acute urinary tract infection Urine analysis with pyuria urine culture shows insignificant growth Patient completed 3 doses of IV Rocephin   Hyperkalemia Resolved.   Anxiety As needed Ativan ordered.  Anemia of chronic disease;  Hemoglobin around 8.     Mild thrombocytopenia.     Tingling and numbness in the legs, Neuropathy from Chemo  DVT prophylaxis: (Lovenox/) Code Status: (Full code) Family Communication:  Disposition:   Status is: Inpatient  Remains inpatient appropriate because:Unsafe d/c plan and IV treatments appropriate due to intensity of illness or inability to take PO  Dispo: The patient is from: Home              Anticipated d/c is to: Home              Patient currently is not medically stable to d/c.   Difficult to place patient No       Consultants:  Oncology Dr Alvy Bimler.   Procedures: none.  Antimicrobials: none.    Subjective: Feet pain. Continues with NG tube connected to suction.   Objective: Vitals:   01/14/21 0457 01/14/21 0500 01/14/21 1015 01/14/21 1350  BP: (!) 182/86  (!) 151/69 (!) 153/73  Pulse: 100  89 84  Resp: 18   20  Temp: 98.2 F (36.8 C)   98.1 F (36.7 C)  TempSrc: Oral     SpO2: 98%   95%  Weight:  101.4 kg     Height:        Intake/Output Summary (Last 24 hours) at 01/14/2021 1535 Last data filed at 01/14/2021 1256 Gross per 24 hour  Intake 1190.75 ml  Output --  Net 1190.75 ml    Filed Weights   01/12/21 0609 01/13/21 0437 01/14/21 0500  Weight: 111.4 kg 103.4 kg 101.4 kg    Examination:  General exam: Ill-appearing, debilitated elderly lady, in mild distress from foot pain Respiratory system: Diminished air entry at bases no wheezing or rhonchi Cardiovascular system: S1-S2 heard, regular rate rhythm, pedal edema present Gastrointestinal system: Abdomen is soft, tender, mildly distended, bowel sounds minimal Central nervous system: Somnolent but able to answer all questions worsening Extremities: Worsening pedal edema Skin: No rashes seen.  Psychiatry: Appears depressed    Data Reviewed: I have personally reviewed following labs and imaging studies  CBC: Recent Labs  Lab 01/11/21 0300 01/12/21 1756  WBC 5.1 4.0  NEUTROABS 4.3  --   HGB 8.8* 8.1*  HCT 28.3* 25.5*  MCV 92.5 90.7  PLT 131* 123*     Basic Metabolic Panel: Recent Labs  Lab 01/10/21 0454 01/11/21 0300 01/12/21 0439 01/12/21 1756 01/13/21 0532 01/14/21 0500  NA 139 135 142 141 140 137  K 4.9 4.8 5.3* 5.0 4.3 4.4  CL 110 109 116* 115* 112* 107  CO2 '23 23 22 23 '$ 20* 23  GLUCOSE 166* 146* 162* 121* 141* 170*  BUN 41* 43* 47* 46* 38* 45*  CREATININE 0.87 0.81 0.78 0.88 0.70 0.75  CALCIUM 9.0 8.7* 9.1 9.1 8.6* 9.2  MG 2.2 2.1 2.3  --  1.9 2.0  PHOS 3.6 3.8 4.2  --  3.5 3.4     GFR: Estimated Creatinine Clearance: 77.6 mL/min (by C-G formula based on SCr of 0.75 mg/dL).  Liver Function Tests: Recent Labs  Lab 01/09/21 0429 01/12/21 0439  AST 14* 23  ALT 11 20  ALKPHOS 57 72  BILITOT 0.2* 0.4  PROT 5.1* 4.8*  ALBUMIN 2.1* 2.0*     CBG: Recent Labs  Lab 01/13/21 1208 01/13/21 1827 01/13/21 2336 01/14/21 0459 01/14/21 1135  GLUCAP 119* 136* 113* 158* 137*      No results  found for this or any previous visit (from the past 240 hour(s)).        Radiology Studies: No results found.      Scheduled Meds:  acetaminophen  650 mg Oral Once   Chlorhexidine Gluconate Cloth  6 each Topical Daily   cloNIDine  0.1 mg Transdermal Weekly   enoxaparin (LOVENOX) injection  40 mg Subcutaneous Q24H   feeding supplement  1 Container Oral TID BM   gabapentin  100 mg Oral TID   insulin aspart  0-9 Units Subcutaneous Q6H   pantoprazole (PROTONIX) IV  40 mg Intravenous Q24H   sodium chloride flush  10-40 mL Intracatheter Q12H   Continuous Infusions:  TPN ADULT (ION) 60 mL/hr at 01/14/21 1256   TPN ADULT (ION)       LOS: 12 days  Hosie Poisson, MD [facility]   To contact the attending provider between 7A-7P or the covering provider during after hours 7P-7A, please log into the web site [email or web address] and access using universal [place] password for that web site. If you do not have the password, please call the hospital operator.  01/14/2021, 3:35 PM

## 2021-01-15 ENCOUNTER — Inpatient Hospital Stay (HOSPITAL_COMMUNITY): Payer: 59

## 2021-01-15 DIAGNOSIS — G893 Neoplasm related pain (acute) (chronic): Secondary | ICD-10-CM | POA: Diagnosis not present

## 2021-01-15 DIAGNOSIS — E876 Hypokalemia: Secondary | ICD-10-CM | POA: Diagnosis not present

## 2021-01-15 DIAGNOSIS — N3001 Acute cystitis with hematuria: Secondary | ICD-10-CM | POA: Diagnosis not present

## 2021-01-15 DIAGNOSIS — C541 Malignant neoplasm of endometrium: Secondary | ICD-10-CM | POA: Diagnosis not present

## 2021-01-15 LAB — BASIC METABOLIC PANEL
Anion gap: 4 — ABNORMAL LOW (ref 5–15)
BUN: 44 mg/dL — ABNORMAL HIGH (ref 8–23)
CO2: 25 mmol/L (ref 22–32)
Calcium: 9 mg/dL (ref 8.9–10.3)
Chloride: 110 mmol/L (ref 98–111)
Creatinine, Ser: 0.81 mg/dL (ref 0.44–1.00)
GFR, Estimated: >60 mL/min (ref >60)
Glucose, Bld: 149 mg/dL — ABNORMAL HIGH (ref 70–99)
Potassium: 4.3 mmol/L (ref 3.5–5.1)
Sodium: 139 mmol/L (ref 135–145)

## 2021-01-15 LAB — GLUCOSE, CAPILLARY
Glucose-Capillary: 131 mg/dL — ABNORMAL HIGH (ref 70–99)
Glucose-Capillary: 137 mg/dL — ABNORMAL HIGH (ref 70–99)
Glucose-Capillary: 137 mg/dL — ABNORMAL HIGH (ref 70–99)
Glucose-Capillary: 140 mg/dL — ABNORMAL HIGH (ref 70–99)
Glucose-Capillary: 142 mg/dL — ABNORMAL HIGH (ref 70–99)

## 2021-01-15 LAB — MAGNESIUM: Magnesium: 1.9 mg/dL (ref 1.7–2.4)

## 2021-01-15 LAB — PHOSPHORUS: Phosphorus: 3.4 mg/dL (ref 2.5–4.6)

## 2021-01-15 MED ORDER — MAGNESIUM SULFATE 2 GM/50ML IV SOLN
2.0000 g | Freq: Once | INTRAVENOUS | Status: AC
  Administered 2021-01-15: 2 g via INTRAVENOUS
  Filled 2021-01-15: qty 50

## 2021-01-15 MED ORDER — TRACE MINERALS CU-MN-SE-ZN 300-55-60-3000 MCG/ML IV SOLN
INTRAVENOUS | Status: AC
  Filled 2021-01-15: qty 614.4

## 2021-01-15 MED ORDER — DICLOFENAC SODIUM 1 % EX GEL
4.0000 g | Freq: Four times a day (QID) | CUTANEOUS | Status: DC
  Administered 2021-01-15 – 2021-01-18 (×11): 4 g via TOPICAL
  Filled 2021-01-15: qty 100

## 2021-01-15 MED ORDER — FAMOTIDINE 20 MG PO TABS
20.0000 mg | ORAL_TABLET | Freq: Once | ORAL | Status: AC
  Administered 2021-01-15: 20 mg via ORAL
  Filled 2021-01-15: qty 1

## 2021-01-15 NOTE — Progress Notes (Signed)
PHARMACY - TOTAL PARENTERAL NUTRITION CONSULT NOTE   Indication: Prolonged ileus  Patient Measurements: Height: '5\' 1"'$  (154.9 cm) Weight: 101.4 kg (223 lb 8.7 oz) IBW/kg (Calculated) : 47.8   Body mass index is 42.24 kg/m. Usual Weight:   Assessment: Patient is a 65 y.o F with endometrial cancer presented to the ED on 01/02/21 with c/o abdominal pain and n/v.  Abd CT on 8/29 showed findings consistent with ileus and disease progress. Plan is to start chemotherapy with this admission and to start TPN on 9/2 for ileus.  - Onc: Chemo 9/2 with Carbo/Taxol/Herceptin - do not anticipate clinical benefit for 5 to 7 days Recommend continue supportive care for the next 2 weeks If she has no improvement in the next 2 weeks, then we will be transitioning her care to comfort measures  01/15/21 7:04 AM Fluid and TPN goal reduced 9/8 d/t fluid overload, patient edematous Bicarb improved with increased acetate content   Glucose / Insulin:  - cbgs (goal <150): range 104-142 - 3 units of insulin given in past 24 hours  Electrolytes:  K 4.4, Mag 1.9, corrCa 10.6 - goal K >4, mag >2 for ileus Renal: scr < 1, BUN remains elevated  Hepatic: LFT WNL (9/8), TBili wnl (9/8). Triglycerides 108 ( 9/3), 156 (9/5)  Intake / Output; MIVF:  - no MIVF - I/O: NG output incr 1260m, UOP 800 ml. Looks like input inaccurate (TPN volume not crossing over to flowsheet for some reason past 2 days) but still net + around 8 L this admission GI Imaging: - 8/29 abd CT: Interval progression of ascites with progression of omental soft tissue nodularity now demonstrating areas of confluent caking. Small bowel is mildly distended and fluid-filled up to 3 cm diameter and diffusely fluid-filled. Imaging features are compatible with ileus. Interval worsening of mild right and moderate left hydronephrosis with decreased perfusion to the left kidney compatible with obstructive uropathy GI Surgeries / Procedures:  - has NG  tube  Central access: port-a-cath TPN start date: 01/06/21  Nutritional Goals: Goal TPN rate is  100 mL/hr (provides 120 g of protein and 2352 kcals per day)  New 80% protein/calorie goal rate 60 ml/hr providing 92 g protein and 1829 kcal  RD Assessment (01/06/21): Kcal:  [id number] Protein:  95-110 gm (RD modified 9/8) Fluid:   2 L/day  80% goals 1828- 2000 kcal, 76-88 g protein  Current Nutrition:  Clear liquids  Plan:  Mag 2gm bolus x1  Continue TPN at reduced rate of 60 ml/hr and target 80% of calorie and protein goals discussed with Dr. GAlvy Bimlerand Dr. AKarleen HampshireTPN @ 60 ml/hr will provide 92 g protein and 1829 kcal Electrolytes in TPN: Na 35 mEq/L, K  30 mEq/L, Ca decr to 2.514m/L, Mg incr to 10 mEq/L, and Phos 5 mmol/L. Continue Cl:Ac to max Ac Add standard MVI and trace elements to TPN Continue  Sensitive q6h SSI and adjust as needed  No MIVF Monitor TPN labs on Mon/Thurs  GrMinda DittoharmD 01/15/2021 7:04 AM

## 2021-01-15 NOTE — Progress Notes (Signed)
NG tube clamped per Dr. Karleen Hampshire.

## 2021-01-15 NOTE — Progress Notes (Signed)
Small amount of light red note in NG tube.

## 2021-01-15 NOTE — Progress Notes (Signed)
PROGRESS NOTE    Amanda Lin  [id number] DOB: 07/20/1955 DOA: 01/02/2021 PCP: Nicolette Bang, MD    Chief Complaint  Patient presents with   Abdominal Pain   Constipation    Brief Narrative:  65 year old lady with prior history of endometrial cancer, diabetes, hypertension and COVID-19 infection presents with abdominal pain and nausea and vomiting for about 3 weeks.  CT of the abdomen pelvis shows ileus and possible evolving bowel obstruction, progression of ascites and omental soft tissue nodularity, worsening bilateral hydronephrosis with obstructive uropathy.  An NG tube was placed discontinued on 01/04/2021 had recurrent small bowel distention with reinsertion of the NG tube on 01/05/2021.  TPN was initiated on 01/06/2021. Patient seen and examined this morning she appears to be anxious for which she was given a small dose of Ativan.  She also appears to be fluid overloaded.  Oncology on board and she underwent palliative chemotherapy. Recommend to monitor-2 to 3 weeks postchemotherapy and watch for any improvement and if no improvement plan to transition to comfort measures. Pt seen and examined, continues to have the NG tube, wants to be taken out.  She reports her left knee is bothering her and is requesting pain meds.    Assessment & Plan:   Principal Problem:   Endometrial carcinoma ([place]) Active Problems:   Cancer associated pain   Nausea with vomiting   Hypokalemia   SBO (small bowel obstruction) ([place])   Acute cystitis with hematuria   Obstructive uropathy   Palliative care by specialist   Stage 3a chronic kidney disease ([place])   Severe protein-calorie malnutrition ([place])   Possible small bowel obstruction S/p NG tube placement. Abdominal x ray shows improvement in obstruction.  oncology recommends keeping NG tube until later this week, until she takes regular food , unfortunately she remains on the NG TUBE connecting to suction.  Continue with TPN  for now.   Progressive endometrial carcinoma Oncology on board, started palliative chemotherapy Plan for comfort measures if no improvement in the next 2 weeks. Patient has vaginal serosanguineous discharge/bleeding today.  Hemoglobin around 8. Recheck labs in am.     Bilateral hydronephrosis left greater than right  supportive care as long as renal function remains stable.   Creatinine remains stable.    Essential hypertension Blood pressure parameters are sub optimal.  Prn hydralazine.    Stage III CKD/azotemia  Creatinine back to baseline.    Sore throat probably from the NG tube Improved with Magic mouthwash.  GERD Stable.   Hypokalemia and hypomagnesemia Replaced.   Acute urinary tract infection Urine analysis with pyuria urine culture shows insignificant growth Patient completed 3 doses of IV Rocephin   Hyperkalemia Resolved.   Anxiety As needed Ativan ordered.  Anemia of chronic disease;  Hemoglobin around 8.     Mild thrombocytopenia.  Recheck cbc in am.     Tingling and numbness in the legs, ? Neuropathy from Chemo  DVT prophylaxis: (Lovenox/) Code Status: (Full code) Family Communication:  Disposition:   Status is: Inpatient  Remains inpatient appropriate because:Unsafe d/c plan and IV treatments appropriate due to intensity of illness or inability to take PO  Dispo: The patient is from: Home              Anticipated d/c is to: Home              Patient currently is not medically stable to d/c.   Difficult to place patient No  Consultants:  Oncology Dr Alvy Bimler.   Procedures: none.   Antimicrobials: none.    Subjective: unable to move her left knee. Slightly swollen knee.   Objective: Vitals:   01/14/21 1350 01/14/21 2157 01/15/21 0558 01/15/21 1213  BP: (!) 153/73 (!) 167/68 (!) 161/72 (!) 166/77  Pulse: 84 83 92 89  Resp: '20 17 16   '$ Temp: 98.1 F (36.7 C) 97.6 F (36.4 C) 98 F (36.7 C) 97.8 F (36.6 C)   TempSrc:  Oral Oral Oral  SpO2: 95% 98% 96% 97%  Weight:      Height:        Intake/Output Summary (Last 24 hours) at 01/15/2021 1328 Last data filed at 01/15/2021 1019 Gross per 24 hour  Intake 749.23 ml  Output 2150 ml  Net -1400.77 ml    Filed Weights   01/12/21 0609 01/13/21 0437 01/14/21 0500  Weight: 111.4 kg 103.4 kg 101.4 kg    Examination:  General exam: IIl appearing lady, deconditioned, in mild distress.  Respiratory system: air entry fair, no wheezing heard, on RA.  Cardiovascular system: S1-S2 heard, RRR.  3+ leg edema. Gastrointestinal system: Abdomen is soft, mildy tender, bowel sounds wnl.  Central nervous system: Alert and oriented,  able to answer simple questions.  Extremities: worsening pedal edema.  Skin: No rashes seen.  Psychiatry: Appears depressed    Data Reviewed: I have personally reviewed following labs and imaging studies  CBC: Recent Labs  Lab 01/11/21 0300 01/12/21 1756  WBC 5.1 4.0  NEUTROABS 4.3  --   HGB 8.8* 8.1*  HCT 28.3* 25.5*  MCV 92.5 90.7  PLT 131* 123*     Basic Metabolic Panel: Recent Labs  Lab 01/11/21 0300 01/12/21 0439 01/12/21 1756 01/13/21 0532 01/14/21 0500 01/15/21 0506  NA 135 142 141 140 137 139  K 4.8 5.3* 5.0 4.3 4.4 4.3  CL 109 116* 115* 112* 107 110  CO2 '23 22 23 '$ 20* 23 25  GLUCOSE 146* 162* 121* 141* 170* 149*  BUN 43* 47* 46* 38* 45* 44*  CREATININE 0.81 0.78 0.88 0.70 0.75 0.81  CALCIUM 8.7* 9.1 9.1 8.6* 9.2 9.0  MG 2.1 2.3  --  1.9 2.0 1.9  PHOS 3.8 4.2  --  3.5 3.4 3.4     GFR: Estimated Creatinine Clearance: 76.7 mL/min (by C-G formula based on SCr of 0.81 mg/dL).  Liver Function Tests: Recent Labs  Lab 01/09/21 0429 01/12/21 0439  AST 14* 23  ALT 11 20  ALKPHOS 57 72  BILITOT 0.2* 0.4  PROT 5.1* 4.8*  ALBUMIN 2.1* 2.0*     CBG: Recent Labs  Lab 01/14/21 1135 01/14/21 1731 01/15/21 0008 01/15/21 0606 01/15/21 1212  GLUCAP 137* 104* 131* 142* 140*      No  results found for this or any previous visit (from the past 240 hour(s)).        Radiology Studies: No results found.      Scheduled Meds:  acetaminophen  650 mg Oral Once   Chlorhexidine Gluconate Cloth  6 each Topical Daily   cloNIDine  0.1 mg Transdermal Weekly   enoxaparin (LOVENOX) injection  40 mg Subcutaneous Q24H   feeding supplement  1 Container Oral TID BM   gabapentin  100 mg Oral TID   insulin aspart  0-9 Units Subcutaneous Q6H   pantoprazole (PROTONIX) IV  40 mg Intravenous Q24H   sodium chloride flush  10-40 mL Intracatheter Q12H   Continuous Infusions:  TPN ADULT (ION) 60 mL/hr  at 01/15/21 0302   TPN ADULT (ION)       LOS: 13 days        Hosie Poisson, MD [facility]   To contact the attending provider between 7A-7P or the covering provider during after hours 7P-7A, please log into the web site [email or web address] and access using universal [place] password for that web site. If you do not have the password, please call the hospital operator.  01/15/2021, 1:28 PM

## 2021-01-16 ENCOUNTER — Inpatient Hospital Stay (HOSPITAL_COMMUNITY): Payer: 59

## 2021-01-16 ENCOUNTER — Other Ambulatory Visit: Payer: Self-pay | Admitting: Hematology and Oncology

## 2021-01-16 DIAGNOSIS — G893 Neoplasm related pain (acute) (chronic): Secondary | ICD-10-CM | POA: Diagnosis not present

## 2021-01-16 DIAGNOSIS — N3001 Acute cystitis with hematuria: Secondary | ICD-10-CM | POA: Diagnosis not present

## 2021-01-16 DIAGNOSIS — C541 Malignant neoplasm of endometrium: Secondary | ICD-10-CM | POA: Diagnosis not present

## 2021-01-16 DIAGNOSIS — E876 Hypokalemia: Secondary | ICD-10-CM | POA: Diagnosis not present

## 2021-01-16 LAB — COMPREHENSIVE METABOLIC PANEL
ALT: 18 U/L (ref 0–44)
AST: 16 U/L (ref 15–41)
Albumin: 2 g/dL — ABNORMAL LOW (ref 3.5–5.0)
Alkaline Phosphatase: 114 U/L (ref 38–126)
Anion gap: 8 (ref 5–15)
BUN: 46 mg/dL — ABNORMAL HIGH (ref 8–23)
CO2: 24 mmol/L (ref 22–32)
Calcium: 8.8 mg/dL — ABNORMAL LOW (ref 8.9–10.3)
Chloride: 106 mmol/L (ref 98–111)
Creatinine, Ser: 0.92 mg/dL (ref 0.44–1.00)
GFR, Estimated: >60 mL/min (ref >60)
Glucose, Bld: 174 mg/dL — ABNORMAL HIGH (ref 70–99)
Potassium: 4 mmol/L (ref 3.5–5.1)
Sodium: 138 mmol/L (ref 135–145)
Total Bilirubin: 0.4 mg/dL (ref 0.3–1.2)
Total Protein: 5.2 g/dL — ABNORMAL LOW (ref 6.5–8.1)

## 2021-01-16 LAB — GLUCOSE, CAPILLARY
Glucose-Capillary: 81 mg/dL (ref 70–99)
Glucose-Capillary: 215 mg/dL — ABNORMAL HIGH (ref 70–99)
Glucose-Capillary: 332 mg/dL — ABNORMAL HIGH (ref 70–99)

## 2021-01-16 LAB — PHOSPHORUS: Phosphorus: 3.5 mg/dL (ref 2.5–4.6)

## 2021-01-16 LAB — MAGNESIUM: Magnesium: 2.2 mg/dL (ref 1.7–2.4)

## 2021-01-16 LAB — TRIGLYCERIDES: Triglycerides: 108 mg/dL (ref <150)

## 2021-01-16 MED ORDER — HYDROMORPHONE HCL 1 MG/ML IJ SOLN
0.5000 mg | INTRAMUSCULAR | Status: DC | PRN
  Administered 2021-01-16 (×2): 1 mg via INTRAVENOUS
  Administered 2021-01-17: 0.5 mg via INTRAVENOUS
  Administered 2021-01-17: 1 mg via INTRAVENOUS
  Administered 2021-01-17: 0.5 mg via INTRAVENOUS
  Administered 2021-01-17 – 2021-01-18 (×4): 1 mg via INTRAVENOUS
  Filled 2021-01-16 (×9): qty 1

## 2021-01-16 MED ORDER — TRACE MINERALS CU-MN-SE-ZN 300-55-60-3000 MCG/ML IV SOLN
INTRAVENOUS | Status: AC
  Filled 2021-01-16: qty 614.4

## 2021-01-16 MED ORDER — LORAZEPAM 2 MG/ML IJ SOLN
0.5000 mg | Freq: Once | INTRAMUSCULAR | Status: AC
  Administered 2021-01-16: 0.5 mg via INTRAVENOUS
  Filled 2021-01-16: qty 1

## 2021-01-16 NOTE — Progress Notes (Signed)
   01/16/21 1708  Vitals  Temp  (vomiting and in pain)  Temp Source Oral  BP (!) 168/87  BP Location Left Arm  BP Method Automatic  Patient Position (if appropriate) Lying  Pulse Rate (!) 147  Pulse Rate Source Dinamap  Resp (!) 44  Level of Consciousness  Level of Consciousness Alert  MEWS COLOR  MEWS Score Color Red  MEWS Score  MEWS Temp 0  MEWS Systolic 0  MEWS Pulse 3  MEWS RR 3  MEWS LOC 0  MEWS Score 6  Provider Notification  Provider Name/Title Dr. Karleen Hampshire  Date Provider Notified 01/16/21  Time Provider Notified [id number]  Notification Type Page  Notification Reason Change in status (red [place], pt vomiting and in pt)  Provider response See new orders  Date of Provider Response 01/16/21  Time of Provider Response 1730

## 2021-01-16 NOTE — Progress Notes (Addendum)
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 She had NG tube replaced due to persistent signs and symptoms of carcinomatosis secondary to recurrent endometrial cancer She has completed 3 days course of IV antibiotics for UTI She had received carboplatin & Taxol & Herceptin last week on January 06, 2021 I do not anticipate clinical benefit for up to 2 weeks after chemo Recommend continue supportive care  If she has no improvement in the next 2 weeks, then we will be transitioning her care to comfort measures   Cancer associated pain She has multifactorial pain I recommend pain medicine as needed   Nausea with vomiting/ileus secondary to abdominal carcinomatosis She continues to intermittent nausea Recommend IV fluids and antiemetics as needed NG tube was clamped over the weekend but she developed increased abdominal pain, distention, and vomiting NG tube was hooked back up to suction and despite that she continues to have vomiting Abdominal x-ray performed overnight continues to show distended bowel Continue NG tube to suction  Physical deconditioning She has generalized weakness and is inclined not to do therapy because of pain and weakness I encouraged the patient to continue to participate with physical therapy while on treatment   Severe protein calorie malnutrition Continue TPN   Acquired pancytopenia Likely due to recent chemo She does not need transfusion support Observe closely I anticipate her pancytopenia will get worse over the weekend Consider transfusion if hemoglobin less than 7 without symptoms all less than 8 if she is symptomatic   Hydronephrosis Monitor renal function closely I spoke with urologist We can technically monitor this closely for now   UTI, adequately treated   Severe  constipation Secondary to carcinomatosis    Goals of care Resolution of ileus, nausea vomiting inability to eat 80% of required calorie intake by mouth I reviewed palliative intent of treatment with the patient and her daughter and they are willing to try She is aware, if despite chemotherapy, that if she did not improve within 2 to 3 weeks time, her condition will be considered terminal at that point in time   Discharge planning She will likely be here for 5 to 7 days  All questions were answered. The patient knows to call the clinic with any problems, questions or concerns.   Amanda Bussing, NP 01/16/2021 8:53 AM [name], MD  Subjective:  She is seen this morning.  Daughter is not available by the bedside Continues to have abdominal pain She is having vomiting this morning  NG tube suction was not working but by the time I saw her No bowel movement in over a week  Objective:  Vitals:   01/16/21 0524 01/16/21 0606  BP: (!) 182/80 (!) 145/66  Pulse: (!) 121 96  Resp: 20   Temp: 97.7 F (36.5 C)   SpO2: 98%      Intake/Output Summary (Last 24 hours) at 01/16/2021 0853 Last data filed at 01/16/2021 0600 Gross per 24 hour  Intake 1724.03 ml  Output 1350 ml  Net 374.03 ml    GENERAL:alert, no distress and comfortable SKIN: skin color, texture, turgor are normal, no rashes or significant lesions EYES: normal, Conjunctiva are pink and non-injected, sclera clear OROPHARYNX:no exudate, no erythema and lips, buccal mucosa, and tongue normal  NECK: supple, thyroid normal size, non-tender, without nodularity LYMPH:  no palpable lymphadenopathy in the  cervical, axillary or inguinal LUNGS: clear to auscultation and percussion with normal breathing effort HEART: regular rate & rhythm and no murmurs with profound bilateral lower extremity edema ABDOMEN: Abdomen distended, tender to light palpation Musculoskeletal:no cyanosis of digits and no clubbing  NEURO: alert & oriented x 3  with fluent speech, no focal motor/sensory deficits   Labs:  Recent Labs    02/23/20 1113 02/26/20 1007 01/07/21 0647 01/08/21 0756 01/09/21 0429 01/10/21 0454 01/12/21 0439 01/12/21 1756 01/13/21 0532 01/14/21 0500 01/15/21 0506  NA 138   < > 137   < > 139   < > 142   < > 140 137 139  K 4.2   < > 4.6   < > 4.8   < > 5.3*   < > 4.3 4.4 4.3  CL 102   < > 112*   < > 110   < > 116*   < > 112* 107 110  CO2 19*   < > 22   < > 22   < > 22   < > 20* 23 25  GLUCOSE 138*   < > 190*   < > 178*   < > 162*   < > 141* 170* 149*  BUN 15   < > 15   < > 33*   < > 47*   < > 38* 45* 44*  CREATININE 0.80   < > 0.97   < > 0.85   < > 0.78   < > 0.70 0.75 0.81  CALCIUM 9.6   < > 8.7*   < > 9.0   < > 9.1   < > 8.6* 9.2 9.0  GFRNONAA 79   < > >60   < > >60   < > >60   < > >60 >60 >60  GFRAA 91  --   --   --   --   --   --   --   --   --   --   PROT 7.8   < > 5.5*  --  5.1*  --  4.8*  --   --   --   --   ALBUMIN 4.4   < > 2.2*  --  2.1*  --  2.0*  --   --   --   --   AST 14   < > 15  --  14*  --  23  --   --   --   --   ALT 19   < > 10  --  11  --  20  --   --   --   --   ALKPHOS 115   < > 66  --  57  --  72  --   --   --   --   BILITOT 0.6   < > 0.3  --  0.2*  --  0.4  --   --   --   --    < > = values in this interval not displayed.    Studies: I have reviewed her most recent x-ray DG Abd 1 View  Result Date: 01/16/2021 CLINICAL DATA:  NG tube placement EXAM: ABDOMEN - 1 VIEW COMPARISON:  01/12/2021 FINDINGS: Enteric tube terminates in the distal gastric body. Dilated loops of small bowel in the left mid abdomen. Surgical clips in the right upper abdomen. IMPRESSION: Enteric tube terminates in the distal gastric body. Electronically Signed   By: Bertis Ruddy  [place] Pink M.D.   On: 01/16/2021 01:51   DG Abd 1 View  Result Date: 01/03/2021 CLINICAL DATA:  Ileus EXAM: ABDOMEN - 1 VIEW COMPARISON:  Portable exam 0848 hours compared to 01/02/2021 FINDINGS: Tip of nasogastric tube projects over stomach.  Excreted contrast material within urinary bladder with faint contrast bilaterally in dilated renal collecting systems. Single nonspecific air-filled nondistended small bowel loops in mid abdomen. Bowel gas pattern otherwise normal. Osseous structures unremarkable. Surgical clips RIGHT upper quadrant likely reflect prior cholecystectomy. IMPRESSION: BILATERAL hydronephrosis. Normal bowel gas pattern. Electronically Signed   By: Lavonia Dana M.D.   On: 01/03/2021 10:44   CT ABDOMEN PELVIS W CONTRAST  Result Date: 01/02/2021 CLINICAL DATA:  Endometrial cancer.  Bowel obstruction suspected. EXAM: CT ABDOMEN AND PELVIS WITH CONTRAST TECHNIQUE: Multidetector CT imaging of the abdomen and pelvis was performed using the standard protocol following bolus administration of intravenous contrast. CONTRAST:  54m OMNIPAQUE IOHEXOL 350 MG/ML SOLN COMPARISON:  11/02/2020 FINDINGS: Lower chest: Basilar atelectasis. Hepatobiliary: No suspicious focal abnormality within the liver parenchyma. Gallbladder is surgically absent. No intrahepatic or extrahepatic biliary dilation. Pancreas: No focal mass lesion. No dilatation of the main duct. No intraparenchymal cyst. No peripancreatic edema. Spleen: No splenomegaly. No focal mass lesion. Adrenals/Urinary Tract: No adrenal nodule or mass. Mild right and moderate left hydronephrosis evident with decreased perfusion to the left kidney. 1.3 cm lesion lower pole right kidney, previously characterized as hemorrhagic cyst. More subtle enhancing lesion identified anterior lower pole right kidney, previously characterized as suspicious for neoplasm on CT without and with contrast dated 03/28/2020. Both ureters are dilated down to the level of the pelvic sidewall. Bladder is unremarkable. Stomach/Bowel: Stomach is unremarkable. No gastric wall thickening. No evidence of outlet obstruction. Duodenum is normally positioned as is the ligament of Treitz. No small bowel wall thickening. Small bowel  is mildly distended up to 3 cm diameter and diffusely fluid-filled. The terminal ileum is normal. The appendix is normal. No gross colonic mass. No colonic wall thickening. Vascular/Lymphatic: There is mild atherosclerotic calcification of the abdominal aorta without aneurysm. Small lymph nodes are seen in the gastrohepatic and hepatoduodenal ligament. Small lymph nodes evident in the retroperitoneal space of the abdomen along the pelvic sidewall bilaterally. Reproductive: Calcified fibroid noted in the uterus. Endometrium not well visualized. There is no adnexal mass. Other: Interval progression ascites with progression of omental soft tissue nodularity now demonstrating areas of confluent "caking" (see midline image 46/2). Enhancing peritoneal nodules are seen adjacent to the inferior liver (35/2) and in the right pelvis (63/2). There are enhancing mesenteric nodules (see central mesentery on 52/2) with soft tissue nodularity in the region of the umbilicus measuring 2.3 x 2.2 cm on image 54/2. Moderate volume ascites seen around the liver and spleen and along the stomach with moderate volume ascites in the pelvis is well. Musculoskeletal: No worrisome lytic or sclerotic osseous abnormality. IMPRESSION: 1. Interval progression of ascites with progression of omental soft tissue nodularity now demonstrating areas of confluent caking. Peritoneal nodularity is also progressive in the interval. 2. Small bowel is mildly distended and fluid-filled up to 3 cm diameter and diffusely fluid-filled. Imaging features are compatible with ileus. Evolving obstruction not entirely excluded. 3. Interval worsening of mild right and moderate left hydronephrosis with decreased perfusion to the left kidney compatible with obstructive uropathy. Both ureters are obstructed at the level of the pelvic sidewall. 4. Subtle enhancing lesion anterior lower pole right kidney, previously characterized as neoplasm on CT without and with  contrast  dated 03/28/2020. 5. Small lymph nodes in the gastrohepatic and hepatoduodenal ligament, in the retroperitoneal space of the abdomen, and along the pelvic sidewall bilaterally. 6. Calcified fibroid in the uterus. Endometrium not well visualized. 7. Aortic Atherosclerosis (ICD10-I70.0). Electronically Signed   By: Misty Stanley M.D.   On: 01/02/2021 11:56   DG Knee Left Port  Result Date: 01/15/2021 CLINICAL DATA:  Left knee pain, limited range of motion EXAM: PORTABLE LEFT KNEE - 1-2 VIEW COMPARISON:  None. FINDINGS: Frontal and lateral views of the left knee are obtained. Lateral view is suboptimal due to patient cooperation and difficulty positioning. No fracture, subluxation, or dislocation. There is 3 compartmental osteoarthritis greatest in the medial compartment, with severe joint space narrowing, eburnation, and marginal osteophyte formation. There is diffuse subcutaneous edema. No joint effusion. IMPRESSION: 1. Severe 3 compartmental osteoarthritis greatest medially. 2. No evidence of acute fracture. 3. Diffuse subcutaneous edema. Electronically Signed   By: Randa Ngo M.D.   On: 01/15/2021 18:08   DG Abd 2 Views  Result Date: 01/12/2021 CLINICAL DATA:  Follow-up bowel obstruction EXAM: ABDOMEN - 2 VIEW COMPARISON:  KUB dated 1 day prior FINDINGS: Enteric catheter is in place with the tip and side hole projecting over the stomach. A right chest wall port is in place with the tip in the lower SVC. There is a nonobstructive bowel gas pattern. Right upper quadrant and pelvic surgical clips are noted. There is a trace left pleural effusion. There is no acute osseous abnormality. IMPRESSION: 1. Enteric catheter tip and side hole in the stomach. 2. Nonobstructive bowel gas pattern. 3. Trace left pleural effusion Electronically Signed   By: Valetta Mole M.D.   On: 01/12/2021 10:06   DG Abd 2 Views  Result Date: 01/05/2021 CLINICAL DATA:  Abdomen pain and emesis EXAM: ABDOMEN - 2 VIEW COMPARISON:   01/03/2021, CT 01/02/2021 FINDINGS: Trace pleural effusion left lung base. No free air beneath the diaphragm. Increased small bowel distension, measuring up to 4.1 cm with multiple fluid levels. Paucity of distal gas. IMPRESSION: 1. Increased small bowel distension with multiple fluid levels and paucity of distal gas, suspect for bowel obstruction Electronically Signed   By: Donavan Foil M.D.   On: 01/05/2021 16:34   DG Abd 2 Views  Result Date: 12/22/2020 CLINICAL DATA:  Right lower quadrant abdominal pain with nausea, vomiting, and constipation. History of endometrial cancer. EXAM: ABDOMEN - 2 VIEW COMPARISON:  None. FINDINGS: The bowel gas pattern is normal. There is no evidence of free air. No radio-opaque calculi or other significant radiographic abnormality is seen. Surgical clips in the right upper quadrant and pelvis. No acute osseous abnormality. IMPRESSION: Negative. Electronically Signed   By: Titus Dubin M.D.   On: 12/22/2020 12:03   DG Abd Portable 1V  Result Date: 01/11/2021 CLINICAL DATA:  Bowel obstruction. EXAM: PORTABLE ABDOMEN - 1 VIEW COMPARISON:  01/08/2021 and CT abdomen pelvis 01/02/2021. FINDINGS: Image quality is degraded by body habitus. Nasogastric tube terminates in the stomach. Gas is seen in nondilated small bowel and some colon. Surgical clips in the right upper quadrant. IMPRESSION: No evidence of small-bowel obstruction. Electronically Signed   By: Lorin Picket M.D.   On: 01/11/2021 08:26   DG Abd Portable 1V  Result Date: 01/08/2021 CLINICAL DATA:  Nasogastric tube adjustment EXAM: PORTABLE ABDOMEN - 1 VIEW COMPARISON:  01/05/2021 FINDINGS: Nasogastric tube placement to the decompressed stomach. A few gas-filled nondilated small bowel loops in the mid abdomen. Colon decompressed.  Cholecystectomy clips. IMPRESSION: Nasogastric tube to decompressed stomach. Electronically Signed   By: Lucrezia Europe M.D.   On: 01/08/2021 15:30   DG Abd Portable 1 View  Result Date:  01/02/2021 CLINICAL DATA:  Check gastric catheter placement EXAM: PORTABLE ABDOMEN - 1 VIEW COMPARISON:  None. FINDINGS: Gastric catheter is noted extending into the stomach. The proximal side port lies just beyond the gastroesophageal junction and could be advanced a few cm as necessary. Visualized lungs are hypoinflated but clear. Right chest wall port is noted. IMPRESSION: Gastric catheter in the stomach as described. Electronically Signed   By: Inez Catalina M.D.   On: 01/02/2021 14:01   [place] EKG SITE RITE  Result Date: 01/08/2021 If Site Rite image not attached, placement could not be confirmed due to current cardiac rhythm.

## 2021-01-16 NOTE — Progress Notes (Signed)
   01/16/21 1708 01/16/21 1857  Vitals  BP (!) 168/87 126/83  BP Location  --  Left Arm  BP Method  --  Automatic  Patient Position (if appropriate)  --  Lying  Pulse Rate (!) 147 (!) 113  Pulse Rate Source  --  [place]  Resp (!) 44 20  Level of Consciousness  Level of Consciousness Alert Alert  MEWS COLOR  MEWS Score Color Red Yellow  MEWS Score  MEWS Temp 0 0  MEWS Systolic 0 0  MEWS Pulse 3 2  MEWS RR 3 0  MEWS LOC 0 0  MEWS Score 6 2

## 2021-01-16 NOTE — Progress Notes (Signed)
PROGRESS NOTE    Amanda Lin  [id number] DOB: 18-Aug-1955 DOA: 01/02/2021 PCP: Nicolette Bang, MD    Chief Complaint  Patient presents with   Abdominal Pain   Constipation    Brief Narrative:  65 year old lady with prior history of endometrial cancer, diabetes, hypertension and COVID-19 infection presents with abdominal pain and nausea and vomiting for about 3 weeks.  CT of the abdomen pelvis shows ileus and possible evolving bowel obstruction, progression of ascites and omental soft tissue nodularity, worsening bilateral hydronephrosis with obstructive uropathy.  An NG tube was placed discontinued on 01/04/2021 had recurrent small bowel distention with reinsertion of the NG tube on 01/05/2021.  TPN was initiated on 01/06/2021. Patient seen and examined this morning she appears to be anxious for which she was given a small dose of Ativan.  She also appears to be fluid overloaded.  Oncology on board and she underwent palliative chemotherapy. Recommend to monitor-2 to 3 weeks postchemotherapy and watch for any improvement and if no improvement plan to transition to comfort measures. NG tube clamped for less than 6 hours, she started having abd pain, nausea, vomiting.  It was un clamped and connected to suction with improvement in her symptoms.  She appears uncomfortable.    Assessment & Plan:   Principal Problem:   Endometrial carcinoma ([place]) Active Problems:   Cancer associated pain   Nausea with vomiting   Hypokalemia   SBO (small bowel obstruction) ([place])   Acute cystitis with hematuria   Obstructive uropathy   Palliative care by specialist   Stage 3a chronic kidney disease ([place])   Severe protein-calorie malnutrition ([place])   Possible small bowel obstruction S/p NG tube placement. Abdominal x ray shows improvement in obstruction.  Tried clamping for a few hours. She developed nausea, vomiting and abdominal pain. It was unclamped and connected to suction.   Continue with TPN for now.   Progressive endometrial carcinoma Oncology on board, started palliative chemotherapy Plan for comfort measures if no improvement in the next 2 weeks, which will be the by Friday.  Patient has vaginal serosanguineous discharge/bleeding today.  Hemoglobin around 8.     Bilateral hydronephrosis left greater than right  supportive care as long as renal function remains stable.   Creatinine remains stable.    Essential hypertension Blood pressure parameters are sub optimal.  Prn hydralazine.    Stage III CKD/azotemia  Creatinine back to baseline.    Sore throat probably from the NG tube Improved with Magic mouthwash.  GERD Stable.   Hypokalemia and hypomagnesemia Replaced.   Acute urinary tract infection Urine analysis with pyuria urine culture shows insignificant growth Patient completed 3 doses of IV Rocephin   Hyperkalemia Resolved.   Anxiety As needed Ativan ordered.  Anemia of chronic disease;  Hemoglobin around 8.     Mild thrombocytopenia.  Recheck cbc in am.     Tingling and numbness in the legs, ? Neuropathy from Chemo Restarted her on neurontin.   DVT prophylaxis: (Lovenox/) Code Status: (Full code) Family Communication: daughter at bedside.  Disposition:   Status is: Inpatient  Remains inpatient appropriate because:Unsafe d/c plan and IV treatments appropriate due to intensity of illness or inability to take PO  Dispo: The patient is from: Home              Anticipated d/c is to: Home              Patient currently is not medically stable to d/c.  Difficult to place patient No       Consultants:  Oncology Dr Alvy Bimler.   Procedures: none.   Antimicrobials: none.    Subjective: Nauseated, vomiting with abd discomfort.   Objective: Vitals:   01/15/21 2342 01/16/21 0013 01/16/21 0524 01/16/21 0606  BP: (!) 180/81 (!) 161/67 (!) 182/80 (!) 145/66  Pulse:   (!) 121 96  Resp:   20   Temp:   97.7  F (36.5 C)   TempSrc:   Oral   SpO2:   98%   Weight:      Height:        Intake/Output Summary (Last 24 hours) at 01/16/2021 1444 Last data filed at 01/16/2021 1002 Gross per 24 hour  Intake 1564.03 ml  Output 1350 ml  Net 214.03 ml    Filed Weights   01/12/21 0609 01/13/21 0437 01/14/21 0500  Weight: 111.4 kg 103.4 kg 101.4 kg    Examination:  General exam: IIl appearing lady, deconditioned,  s/p NG tube in mod distress.  Respiratory system: Clear to auscultation. Respiratory effort normal. Cardiovascular system: S1 & S2 heard, RRR. No JVD, 3+ edema.  Gastrointestinal system: Abdomen is soft, generalized tenderness, bowel sounds minimal.  Central nervous system: Alert and oriented to person and place.  Extremities: 3 + leg edema.  Skin: No rashes,  Psychiatry: anxious.      Data Reviewed: I have personally reviewed following labs and imaging studies  CBC: Recent Labs  Lab 01/11/21 0300 01/12/21 1756  WBC 5.1 4.0  NEUTROABS 4.3  --   HGB 8.8* 8.1*  HCT 28.3* 25.5*  MCV 92.5 90.7  PLT 131* 123*     Basic Metabolic Panel: Recent Labs  Lab 01/12/21 0439 01/12/21 1756 01/13/21 0532 01/14/21 0500 01/15/21 0506 01/16/21 0525  NA 142 141 140 137 139 138  K 5.3* 5.0 4.3 4.4 4.3 4.0  CL 116* 115* 112* 107 110 106  CO2 22 23 20* '23 25 24  '$ GLUCOSE 162* 121* 141* 170* 149* 174*  BUN 47* 46* 38* 45* 44* 46*  CREATININE 0.78 0.88 0.70 0.75 0.81 0.92  CALCIUM 9.1 9.1 8.6* 9.2 9.0 8.8*  MG 2.3  --  1.9 2.0 1.9 2.2  PHOS 4.2  --  3.5 3.4 3.4 3.5     GFR: Estimated Creatinine Clearance: 67.5 mL/min (by C-G formula based on SCr of 0.92 mg/dL).  Liver Function Tests: Recent Labs  Lab 01/12/21 0439 01/16/21 0525  AST 23 16  ALT 20 18  ALKPHOS 72 114  BILITOT 0.4 0.4  PROT 4.8* 5.2*  ALBUMIN 2.0* 2.0*     CBG: Recent Labs  Lab 01/15/21 1212 01/15/21 1826 01/15/21 2345 01/16/21 0528 01/16/21 1245  GLUCAP 140* 137* 137* 81 215*      No  results found for this or any previous visit (from the past 240 hour(s)).        Radiology Studies: DG Abd 1 View  Result Date: 01/16/2021 CLINICAL DATA:  NG tube placement EXAM: ABDOMEN - 1 VIEW COMPARISON:  01/12/2021 FINDINGS: Enteric tube terminates in the distal gastric body. Dilated loops of small bowel in the left mid abdomen. Surgical clips in the right upper abdomen. IMPRESSION: Enteric tube terminates in the distal gastric body. Electronically Signed   By: Julian Hy M.D.   On: 01/16/2021 01:51   DG Knee Left Port  Result Date: 01/15/2021 CLINICAL DATA:  Left knee pain, limited range of motion EXAM: PORTABLE LEFT KNEE - 1-2 VIEW COMPARISON:  None. FINDINGS: Frontal and lateral views of the left knee are obtained. Lateral view is suboptimal due to patient cooperation and difficulty positioning. No fracture, subluxation, or dislocation. There is 3 compartmental osteoarthritis greatest in the medial compartment, with severe joint space narrowing, eburnation, and marginal osteophyte formation. There is diffuse subcutaneous edema. No joint effusion. IMPRESSION: 1. Severe 3 compartmental osteoarthritis greatest medially. 2. No evidence of acute fracture. 3. Diffuse subcutaneous edema. Electronically Signed   By: Randa Ngo M.D.   On: 01/15/2021 18:08        Scheduled Meds:  Chlorhexidine Gluconate Cloth  6 each Topical Daily   cloNIDine  0.1 mg Transdermal Weekly   diclofenac Sodium  4 g Topical QID   enoxaparin (LOVENOX) injection  40 mg Subcutaneous Q24H   feeding supplement  1 Container Oral TID BM   gabapentin  100 mg Oral TID   insulin aspart  0-9 Units Subcutaneous Q6H   pantoprazole (PROTONIX) IV  40 mg Intravenous Q24H   sodium chloride flush  10-40 mL Intracatheter Q12H   Continuous Infusions:  TPN ADULT (ION) 60 mL/hr at 01/16/21 0307   TPN ADULT (ION)       LOS: 14 days        Hosie Poisson, MD [facility]   To contact the attending  provider between 7A-7P or the covering provider during after hours 7P-7A, please log into the web site [email or web address] and access using universal [place] password for that web site. If you do not have the password, please call the hospital operator.  01/16/2021, 2:44 PM

## 2021-01-16 NOTE — Progress Notes (Signed)
PHARMACY - TOTAL PARENTERAL NUTRITION CONSULT NOTE   Indication: Prolonged ileus  Patient Measurements: Height: '5\' 1"'$  (154.9 cm) Weight: 101.4 kg (223 lb 8.7 oz) IBW/kg (Calculated) : 47.8   Body mass index is 42.24 kg/m. Usual Weight:   Assessment: Patient is a 65 y.o F with endometrial cancer presented to the ED on 01/02/21 with c/o abdominal pain and n/v.  Abd CT on 8/29 showed findings consistent with ileus and disease progress. Pharmacy consulted to manage TPN.  Patient received chemotherapy on 9/2 with Carbo/Taxol/Herceptin.   Glucose / Insulin: No hx DM. On sensitive SSI q6h - cbgs (goal <180): range 81-174 - 3 units of insulin given in past 24 hours  Electrolytes: CorrCa (10.4) slightly elevated; all other lytes WNL - goal K >/=4, mag >/=2 for ileus Renal: SCr < 1, BUN remains elevated  Hepatic: LFT WNL (9/12), TBili WNL (9/12). Triglycerides WNL (9/12)  Intake / Output; MIVF:  - no MIVF - NG output: 200 mL; UOP 1150 mL GI Imaging: - 8/29 abd CT: Interval progression of ascites with progression of omental soft tissue nodularity now demonstrating areas of confluent caking. Small bowel is mildly distended and fluid-filled up to 3 cm diameter and diffusely fluid-filled. Imaging features are compatible with ileus. Interval worsening of mild right and moderate left hydronephrosis with decreased perfusion to the left kidney compatible with obstructive uropathy GI Surgeries / Procedures:  - has NG tube  Central access: port-a-cath TPN start date: 01/06/21  Nutritional Goals: Total fluid and TPN goal rate were reduced on 9/8 to target 80% of protein/calorie goals due to pt being fluid overloaded/edematous. Discussed with Dr. Alvy Bimler and Dr. Karleen Hampshire.  RD Assessment (01/06/21) Kcal:  2285-2500 kcal Protein:  95-110 gm (RD modified 9/8) Fluid:   2 L/day  80% goals = 1828- 2000 kcal, 76-88 g protein, 1.6 L fluid/day  New 80% protein/calorie goal rate TPN of 60 mL/hr provides 92 g  protein and 1829 kcal  Current Nutrition:  TPN, Dysphagia III diet  Trailed clamping NGT on 9/11  Plan:  Continue TPN at current rate of 60 mL/hr to target 80% of nutritional goals Will provide 92 g protein, 302 g dextrose, & 1829 kcal Use concentrated amino acid to reduce total volume requirements Electrolytes in TPN: Remove Ca, slightly decrease Mg Na 35 mEq/L, K  30 mEq/L, Ca 0 mEq/L, Mg 8 mEq/L, and Phos 5 mmol/L.  Continue Cl:Ac to max Acetate Add standard MVI and trace elements to TPN Continue Sensitive q6h SSI and adjust as needed  No MIVF Monitor TPN labs on Mon/Thurs. Recheck electrolytes with AM labs tomorrow.  Darylene Price [facility] PharmD 01/16/2021 11:13 AM

## 2021-01-17 ENCOUNTER — Inpatient Hospital Stay (HOSPITAL_COMMUNITY): Payer: 59

## 2021-01-17 DIAGNOSIS — C541 Malignant neoplasm of endometrium: Secondary | ICD-10-CM | POA: Diagnosis not present

## 2021-01-17 DIAGNOSIS — G893 Neoplasm related pain (acute) (chronic): Secondary | ICD-10-CM | POA: Diagnosis not present

## 2021-01-17 DIAGNOSIS — N3001 Acute cystitis with hematuria: Secondary | ICD-10-CM | POA: Diagnosis not present

## 2021-01-17 DIAGNOSIS — E876 Hypokalemia: Secondary | ICD-10-CM | POA: Diagnosis not present

## 2021-01-17 LAB — MAGNESIUM: Magnesium: 2.3 mg/dL (ref 1.7–2.4)

## 2021-01-17 LAB — LACTIC ACID, PLASMA: Lactic Acid, Venous: 1.5 mmol/L (ref 0.5–1.9)

## 2021-01-17 LAB — DIFFERENTIAL
Abs Immature Granulocytes: 0.01 10*3/uL (ref 0.00–0.07)
Basophils Absolute: 0 10*3/uL (ref 0.0–0.1)
Basophils Relative: 0 %
Eosinophils Absolute: 0 10*3/uL (ref 0.0–0.5)
Eosinophils Relative: 0 %
Immature Granulocytes: 1 %
Lymphocytes Relative: 31 %
Lymphs Abs: 0.3 10*3/uL — ABNORMAL LOW (ref 0.7–4.0)
Monocytes Absolute: 0.5 10*3/uL (ref 0.1–1.0)
Monocytes Relative: 51 %
Neutro Abs: 0.2 10*3/uL — CL (ref 1.7–7.7)
Neutrophils Relative %: 17 %

## 2021-01-17 LAB — CBC
HCT: 34.6 % — ABNORMAL LOW (ref 36.0–46.0)
Hemoglobin: 11.2 g/dL — ABNORMAL LOW (ref 12.0–15.0)
MCH: 28.6 pg (ref 26.0–34.0)
MCHC: 32.4 g/dL (ref 30.0–36.0)
MCV: 88.5 fL (ref 80.0–100.0)
Platelets: 182 10*3/uL (ref 150–400)
RBC: 3.91 MIL/uL (ref 3.87–5.11)
RDW: 15.6 % — ABNORMAL HIGH (ref 11.5–15.5)
WBC: 0.9 10*3/uL — CL (ref 4.0–10.5)
nRBC: 0 % (ref 0.0–0.2)

## 2021-01-17 LAB — GLUCOSE, CAPILLARY
Glucose-Capillary: 229 mg/dL — ABNORMAL HIGH (ref 70–99)
Glucose-Capillary: 234 mg/dL — ABNORMAL HIGH (ref 70–99)
Glucose-Capillary: 252 mg/dL — ABNORMAL HIGH (ref 70–99)
Glucose-Capillary: 253 mg/dL — ABNORMAL HIGH (ref 70–99)
Glucose-Capillary: 306 mg/dL — ABNORMAL HIGH (ref 70–99)

## 2021-01-17 LAB — BASIC METABOLIC PANEL
Anion gap: 8 (ref 5–15)
BUN: 63 mg/dL — ABNORMAL HIGH (ref 8–23)
CO2: 23 mmol/L (ref 22–32)
Calcium: 8.5 mg/dL — ABNORMAL LOW (ref 8.9–10.3)
Chloride: 103 mmol/L (ref 98–111)
Creatinine, Ser: 1.15 mg/dL — ABNORMAL HIGH (ref 0.44–1.00)
GFR, Estimated: 53 mL/min — ABNORMAL LOW (ref >60)
Glucose, Bld: 293 mg/dL — ABNORMAL HIGH (ref 70–99)
Potassium: 4.5 mmol/L (ref 3.5–5.1)
Sodium: 134 mmol/L — ABNORMAL LOW (ref 135–145)

## 2021-01-17 LAB — PROCALCITONIN: Procalcitonin: 51.79 ng/mL

## 2021-01-17 LAB — PHOSPHORUS: Phosphorus: 3.4 mg/dL (ref 2.5–4.6)

## 2021-01-17 MED ORDER — SODIUM PHOSPHATES 45 MMOLE/15ML IV SOLN
INTRAVENOUS | Status: DC
  Filled 2021-01-17: qty 614.4

## 2021-01-17 MED ORDER — LACTATED RINGERS IV BOLUS
500.0000 mL | Freq: Once | INTRAVENOUS | Status: AC
  Administered 2021-01-17: 500 mL via INTRAVENOUS

## 2021-01-17 MED ORDER — INSULIN ASPART 100 UNIT/ML IJ SOLN
0.0000 [IU] | INTRAMUSCULAR | Status: DC
  Administered 2021-01-17 (×2): 11 [IU] via SUBCUTANEOUS
  Administered 2021-01-18 (×3): 4 [IU] via SUBCUTANEOUS

## 2021-01-17 MED ORDER — LORAZEPAM 2 MG/ML IJ SOLN: 0.5000 mg | INTRAMUSCULAR | Status: DC | PRN

## 2021-01-17 NOTE — Progress Notes (Signed)
PROGRESS NOTE    Amanda Lin  [id number] DOB: 1955/11/15 DOA: 01/02/2021 PCP: Nicolette Bang, MD    Chief Complaint  Patient presents with   Abdominal Pain   Constipation    Brief Narrative:  65 year old lady with prior history of endometrial cancer, diabetes, hypertension and COVID-19 infection presents with abdominal pain and nausea and vomiting for about 3 weeks.  CT of the abdomen pelvis shows ileus and possible evolving bowel obstruction, progression of ascites and omental soft tissue nodularity, worsening bilateral hydronephrosis with obstructive uropathy.  An NG tube was placed ,discontinued on 01/04/2021 had recurrent small bowel distention with reinsertion of the NG tube on 01/05/2021.  TPN was initiated on 01/06/2021.  Oncology on board and she underwent palliative chemotherapy. Recommend to monitor-2 to 3 weeks postchemotherapy and watch for any improvement and if no improvement plan to transition to comfort measures.  Over the last one week, we tried to clamp the NG tube several times without any success, she continues to have vomiting with the NG tube intermittently whenever she takes any food by mouth. Today, she had low grade temp, became tachycardic, and is in pain all over. She reports feeling miserable. Labs are pertinent for neutropenia probably from chemo.     Assessment & Plan:   Principal Problem:   Endometrial carcinoma ([place]) Active Problems:   Cancer associated pain   Nausea with vomiting   Hypokalemia   SBO (small bowel obstruction) ([place])   Acute cystitis with hematuria   Obstructive uropathy   Palliative care by specialist   Stage 3a chronic kidney disease ([place])   Severe protein-calorie malnutrition ([place])   Possible small bowel obstruction S/p NG tube placement. Abdominal x ray shows improvement in obstruction.   Over the last one week, we tried to clamp the NG tube several times without any success, she continues to have vomiting with  the NG tube whenever she takes any food by mouth. X rays have not been helpful. Will check for CT abd and pelvis for further evaluation.  Continue with TPN for now for nutrition.    Progressive endometrial carcinoma Oncology on board, received palliative chemotherapy. Plan for comfort measures if no improvement in the next 2 weeks, which will be by this Friday.  She continues to be miserable, and feel that she is in constant pain, or vomiting with any oral intake. She is reaching end of life. She is neutropenic today and has low grade temp from chemotherapy.     Bilateral hydronephrosis left greater than right  supportive care as long as renal function remains stable.   Creatinine remains stable.    Essential hypertension Blood pressure parameters are borderline low.  Prn hydralazine.    Stage III CKD/azotemia  Creatinine back to baseline.    Sore throat probably from the NG tube Improved with Magic mouthwash.  GERD Stable.   Hypokalemia and hypomagnesemia Replaced.   Acute urinary tract infection Urine analysis with pyuria urine culture shows insignificant growth Patient completed 3 doses of IV Rocephin   Hyperkalemia Resolved.   Anxiety As needed Ativan ordered.  Anemia of chronic disease;  Hemoglobin around 8.    Tingling and numbness in the legs, ? Neuropathy from Chemo Restarted her on neurontin. But unable to tolerate, so we will stick to IV pain meds at this time.    Neutropenia with ANC is 200.  Low grade fever, borderline BP parameters.  Ordered CXR and CT abd and pelvis without contrast for further evaluation.  DVT prophylaxis: (Lovenox/) Code Status: (Full code) Family Communication: None at bedside.  Disposition:   Status is: Inpatient  Remains inpatient appropriate because:Unsafe d/c plan and IV treatments appropriate due to intensity of illness or inability to take PO  Dispo: The patient is from: Home              Anticipated d/c  is to: Home              Patient currently is not medically stable to d/c.   Difficult to place patient No       Consultants:  Oncology Dr Alvy Bimler.   Procedures: none.   Antimicrobials: none.    Subjective: Disheveled, in mod distress from nausea and vomiting .    Objective: Vitals:   01/16/21 2326 01/17/21 0342 01/17/21 0654 01/17/21 0724  BP: 131/83 (!) 148/85 101/75   Pulse: (!) 132 (!) 161 (!) 151   Resp: '18 20 19   '$ Temp: 98.7 F (37.1 C) 97.9 F (36.6 C) 99 F (37.2 C)   TempSrc: Oral Oral Oral   SpO2: 98% 96% 97%   Weight:    105.5 kg  Height:        Intake/Output Summary (Last 24 hours) at 01/17/2021 1146 Last data filed at 01/17/2021 1047 Gross per 24 hour  Intake 1412.48 ml  Output 4320 ml  Net -2907.52 ml    Filed Weights   01/13/21 0437 01/14/21 0500 01/17/21 0724  Weight: 103.4 kg 101.4 kg 105.5 kg    Examination:  General exam: I chronically ill-appearing lady, deconditioned with NG tube connected to suction Respiratory system: Diminished air entry at bases, on room air Cardiovascular system: S1 & S2 heard, tachycardic no JVD, 3+ edema.  Gastrointestinal system: Abdomen is soft, distended with generalized tenderness, bowel sounds cannot be heard Central nervous system: Alert, oriented to person and place only  Extremities: 3 + leg edema.  Skin: No rashes,  Psychiatry: very anxious.      Data Reviewed: I have personally reviewed following labs and imaging studies  CBC: Recent Labs  Lab 01/11/21 0300 01/12/21 1756 01/17/21 0421  WBC 5.1 4.0 0.9*  NEUTROABS 4.3  --  0.2*  HGB 8.8* 8.1* 11.2*  HCT 28.3* 25.5* 34.6*  MCV 92.5 90.7 88.5  PLT 131* 123* 182     Basic Metabolic Panel: Recent Labs  Lab 01/13/21 0532 01/14/21 0500 01/15/21 0506 01/16/21 0525 01/17/21 0421  NA 140 137 139 138 134*  K 4.3 4.4 4.3 4.0 4.5  CL 112* 107 110 106 103  CO2 20* '23 25 24 23  '$ GLUCOSE 141* 170* 149* 174* 293*  BUN 38* 45* 44* 46* 63*   CREATININE 0.70 0.75 0.81 0.92 1.15*  CALCIUM 8.6* 9.2 9.0 8.8* 8.5*  MG 1.9 2.0 1.9 2.2 2.3  PHOS 3.5 3.4 3.4 3.5 3.4     GFR: Estimated Creatinine Clearance: 55.3 mL/min (A) (by C-G formula based on SCr of 1.15 mg/dL (H)).  Liver Function Tests: Recent Labs  Lab 01/12/21 0439 01/16/21 0525  AST 23 16  ALT 20 18  ALKPHOS 72 114  BILITOT 0.4 0.4  PROT 4.8* 5.2*  ALBUMIN 2.0* 2.0*     CBG: Recent Labs  Lab 01/16/21 0528 01/16/21 1245 01/16/21 1844 01/17/21 0017 01/17/21 0615  GLUCAP 81 215* 332* 234* 253*      No results found for this or any previous visit (from the past 240 hour(s)).        Radiology Studies: DG Abd  1 View  Result Date: 01/16/2021 CLINICAL DATA:  NG tube placement EXAM: ABDOMEN - 1 VIEW COMPARISON:  01/12/2021 FINDINGS: Enteric tube terminates in the distal gastric body. Dilated loops of small bowel in the left mid abdomen. Surgical clips in the right upper abdomen. IMPRESSION: Enteric tube terminates in the distal gastric body. Electronically Signed   By: Julian Hy M.D.   On: 01/16/2021 01:51   DG Knee Left Port  Result Date: 01/15/2021 CLINICAL DATA:  Left knee pain, limited range of motion EXAM: PORTABLE LEFT KNEE - 1-2 VIEW COMPARISON:  None. FINDINGS: Frontal and lateral views of the left knee are obtained. Lateral view is suboptimal due to patient cooperation and difficulty positioning. No fracture, subluxation, or dislocation. There is 3 compartmental osteoarthritis greatest in the medial compartment, with severe joint space narrowing, eburnation, and marginal osteophyte formation. There is diffuse subcutaneous edema. No joint effusion. IMPRESSION: 1. Severe 3 compartmental osteoarthritis greatest medially. 2. No evidence of acute fracture. 3. Diffuse subcutaneous edema. Electronically Signed   By: Randa Ngo M.D.   On: 01/15/2021 18:08        Scheduled Meds:  Chlorhexidine Gluconate Cloth  6 each Topical Daily    cloNIDine  0.1 mg Transdermal Weekly   diclofenac Sodium  4 g Topical QID   enoxaparin (LOVENOX) injection  40 mg Subcutaneous Q24H   feeding supplement  1 Container Oral TID BM   gabapentin  100 mg Oral TID   insulin aspart  0-20 Units Subcutaneous Q4H   pantoprazole (PROTONIX) IV  40 mg Intravenous Q24H   sodium chloride flush  10-40 mL Intracatheter Q12H   Continuous Infusions:  TPN ADULT (ION) 60 mL/hr at 01/17/21 0644     LOS: 15 days        Hosie Poisson, MD [facility]   To contact the attending provider between 7A-7P or the covering provider during after hours 7P-7A, please log into the web site [email or web address] and access using universal [facility] password for that web site. If you do not have the password, please call the hospital operator.  01/17/2021, 11:46 AM

## 2021-01-17 NOTE — Progress Notes (Signed)
   01/16/21 2110  Assess: MEWS Score  Temp 97.6 F (36.4 C)  BP (!) 151/91  Pulse Rate (!) 118  Resp 18  SpO2 99 %  O2 Device Room Air  Assess: MEWS Score  MEWS Temp 0  MEWS Systolic 0  MEWS Pulse 2  MEWS RR 0  MEWS LOC 0  MEWS Score 2  MEWS Score Color Yellow  Assess: if the MEWS score is Yellow or Red  Were vital signs taken at a resting state? Yes  Focused Assessment No change from prior assessment  Does the patient meet 2 or more of the SIRS criteria? No  Does the patient have a confirmed or suspected source of infection? No  MEWS guidelines implemented *See Row Information* No, previously yellow, continue vital signs every 4 hours  Treat  MEWS Interventions Administered prn meds/treatments  Document  Patient Outcome Stabilized after interventions  Assess: SIRS CRITERIA  SIRS Temperature  0  SIRS Pulse 1  SIRS Respirations  0  SIRS WBC 0  SIRS Score Sum  1

## 2021-01-17 NOTE — Progress Notes (Signed)
PT Cancellation Note  Patient Details Name: ITALEE VELES MRN: [id number] DOB: 1955/08/08   Cancelled Treatment:    Reason Eval/Treat Not Completed: Other (comment) (Pt asleep). Per RN, pt recently received pain medication due to high pain and now resting soundly. Will check back for PT.    Talbot Grumbling PT, DPT 01/17/21, 11:26 AM

## 2021-01-17 NOTE — Progress Notes (Signed)
Inpatient Diabetes Program Recommendations  AACE/ADA: New Consensus Statement on Inpatient Glycemic Control (2015)  Target Ranges:  Prepandial:   less than 140 mg/dL      Peak postprandial:   less than 180 mg/dL (1-2 hours)      Critically ill patients:  140 - 180 mg/dL   Lab Results  Component Value Date   GLUCAP 253 (H) 01/17/2021   HGBA1C 7.9 (H) 02/23/2020    Review of Glycemic Control Results for Amanda Lin, Amanda Lin (MRN [id number]) as of 01/17/2021 10:08  Ref. Range 01/16/2021 05:28 01/16/2021 12:45 01/16/2021 18:44 01/17/2021 00:17 01/17/2021 06:15  Glucose-Capillary Latest Ref Range: 70 - 99 mg/dL 81 215 (H) 332 (H) 234 (H) 253 (H)   Diabetes history: Type 2 DM Outpatient Diabetes medications: none Current orders for Inpatient glycemic control: Novolog 0-20 units Q4H  Inpatient Diabetes Program Recommendations:    Noted hyperglycemia that is markedly different from previous impatient hospitalization course. No noted steroids, IV Dextrose, supplements, diet or change to TPN. Noted mild temp and HR. Secure chat sent to MD. In agreement with current orders.  Thanks, Bronson Curb, MSN, RNC-OB Diabetes Coordinator [phone] (8a-5p)

## 2021-01-17 NOTE — Progress Notes (Signed)
PHARMACY - TOTAL PARENTERAL NUTRITION CONSULT NOTE   Indication: Prolonged ileus  Patient Measurements: Height: '5\' 1"'$  (154.9 cm) Weight: 105.5 kg (232 lb 9.4 oz) IBW/kg (Calculated) : 47.8   Body mass index is 43.95 kg/m. Usual Weight:   Assessment: Patient is a 65 y.o F with endometrial cancer presented to the ED on 01/02/21 with c/o abdominal pain and n/v.  Abd CT on 8/29 showed findings consistent with ileus and disease progression. Pharmacy consulted to manage TPN.  Patient received chemotherapy on 9/2 with Carbo/Taxol/Herceptin.   Glucose / Insulin: No hx DM. On sensitive SSI q6h - cbgs (goal <180): range 215-332 not controlled - 18 units of insulin given in past 24 hours. Increased requirements - BG has previously been within goal range requiring minimal SSI Hyperglycemia: No change to dextrose concentration of TPN or medications that would likely cause new hyperglycemia Electrolytes: Na (134) slightly low; K/Mg on upper end of normal.  All other lytes WNL, including CorrCa (10.1) now that Ca removed from TPN - goal K >/=4, mag >/=2 for ileus Renal: SCr up to 1.15, BUN (63) trending up. Worsening renal function Hepatic: LFT WNL (9/12), TBili WNL (9/12). Triglycerides WNL (9/12)  Intake / Output; MIVF: Strict I/O not measured - no MIVF - NG output: 2500 mL + unmeasured occurrences; UOP 920 mL GI Imaging: - 8/29 abd CT: Interval progression of ascites with progression of omental soft tissue nodularity now demonstrating areas of confluent caking. Small bowel is mildly distended and fluid-filled up to 3 cm diameter and diffusely fluid-filled. Imaging features are compatible with ileus. Interval worsening of mild right and moderate left hydronephrosis with decreased perfusion to the left kidney compatible with obstructive uropathy GI Surgeries / Procedures:  - has NG tube  Central access: port-a-cath TPN start date: 01/06/21  Nutritional Goals: Total fluid and TPN goal rate were  reduced on 9/8 to target 80% of protein/calorie goals due to pt being fluid overloaded/edematous. Discussed with Dr. Alvy Bimler and Dr. Karleen Hampshire.  RD Assessment (01/06/21) Kcal:  2285-2500 kcal Protein:  95-110 gm (RD modified 9/8) Fluid:   2 L/day  80% goals = 1828- 2000 kcal, 76-88 g protein, 1.6 L fluid/day  New 80% protein/calorie goal rate TPN of 60 mL/hr provides 92 g protein and 1829 kcal  Current Nutrition:  TPN, Dysphagia III diet -Discussed on rounds: pt not currently taking anything by mouth  Trailed clamping NGT on 9/11, pt did not tolerate. No plans to trail clamping again in the next 48 hours.   Plan:  Now: Increase SSI to resistant SSI q4h  At 1800: Continue TPN at current rate of 60 mL/hr to target 80% of nutritional goals Will provide 92 g protein, 302 g dextrose, & 1829 kcal Use concentrated amino acid to reduce total volume requirements Electrolytes in TPN: Remove K, decrease Mg due to worsening renal function. Increase Na Na 50 mEq/L, K  0 mEq/L, Ca 0 mEq/L, Mg 4 mEq/L, and Phos 5 mmol/L.  Continue Cl:Ac to max Acetate Add standard MVI and trace elements to TPN Continue rSSI q4h and adjust as needed No MIVF Monitor TPN labs on Mon/Thurs. Recheck electrolytes with AM labs tomorrow.  Discussed new hyperglycemia and nutrition with Dr Karleen Hampshire - will continue with current nutrition and increased SSI coverage for now.  Darylene Price [facility] PharmD 01/17/2021 8:05 AM

## 2021-01-17 NOTE — Progress Notes (Addendum)
Date and time results received: 01/17/21 1200  Test: ANC Critical Value: 0.2  Name of Provider Notified: [name] NP/ Alvy Bimler, Ni/[place] Orders Received? Or Actions Taken?: see new orders

## 2021-01-17 NOTE — Progress Notes (Signed)
Date and time results received: 01/17/21 0533   Test: CBC Critical Value: WBC 0.9  Name of Provider Notified: Clarene Essex, NP  Orders Received? Or Actions Taken?:  No new orders. Acknowledged.

## 2021-01-17 NOTE — Progress Notes (Addendum)
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 She had NG tube replaced due to persistent signs and symptoms of carcinomatosis secondary to recurrent endometrial cancer She has completed 3 days course of IV antibiotics for UTI She had received carboplatin & Taxol & Herceptin last week on January 06, 2021 I do not anticipate clinical benefit for up to 2 weeks after chemo Recommend continue supportive care  If she has no improvement in the next 2 days, then we will be transitioning her care to comfort measures   Cancer associated pain She has multifactorial pain I recommend pain medicine as needed   Nausea with vomiting/ileus secondary to abdominal carcinomatosis She continues to intermittent nausea and vomiting Recommend IV fluids and antiemetics as needed NG tube was clamped over the weekend but she developed increased abdominal pain, distention, and vomiting NG tube was hooked back up to suction and despite that she continues to have vomiting Abdominal x-ray performed overnight continues to show distended bowel Continue NG tube to suction  Physical deconditioning She has generalized weakness and is inclined not to do therapy because of pain and weakness I encouraged the patient to continue to participate with physical therapy while on treatment   Severe protein calorie malnutrition Continue TPN   Acquired pancytopenia Likely due to recent chemo She does not need transfusion support Observe closely Consider transfusion if hemoglobin less than 7 without symptoms all less than 8 if she is symptomatic She has developed leukopenia -I have added a differential onto this morning's CBC No need GCSF unless she has febrile neutropenia   Hydronephrosis Monitor renal function closely I spoke with urologist We can  technically monitor this closely for now   UTI, adequately treated   Severe constipation Secondary to carcinomatosis    Goals of care Resolution of ileus, nausea vomiting inability to eat 80% of required calorie intake by mouth Overall, I do not see any improvement I plan to have family meeting with her daughter tomorrow in her room to consider transitioning her to end of life care   Discharge planning She will likely be here for next few days  All questions were answered. The patient knows to call the clinic with any problems, questions or concerns.   [name], NP 01/17/2021 9:26 AM [name], MD  Subjective:  Daughter was at the bedside this morning Patient is more sedated but has been switched to Dilaudid due to increased pain Despite receiving Dilaudid, patient still rates pain a 6 out of 10 Patient states that her abdomen still feels distended Her pain is now on her back instead of her abdomen She continues to have vomiting despite NG tube to suction No bowel movement in close to 2 weeks She is not having any fevers  Objective:  Vitals:   01/17/21 0342 01/17/21 0654  BP: (!) 148/85 101/75  Pulse: (!) 161 (!) 151  Resp: 20 19  Temp: 97.9 F (36.6 C) 99 F (37.2 C)  SpO2: 96% 97%     Intake/Output Summary (Last 24 hours) at 01/17/2021 0926 Last data filed at 01/17/2021 0700 Gross per 24 hour  Intake 1522.48 ml  Output 3420 ml  Net -1897.52 ml    GENERAL:alert, no distress and comfortable SKIN: skin color, texture, turgor are normal, no rashes or significant lesions EYES: normal, Conjunctiva are pink and  non-injected, sclera clear OROPHARYNX:no exudate, no erythema and lips, buccal mucosa, and tongue normal  NECK: supple, thyroid normal size, non-tender, without nodularity LYMPH:  no palpable lymphadenopathy in the cervical, axillary or inguinal LUNGS: clear to auscultation and percussion with normal breathing effort HEART: regular rate & rhythm and no  murmurs with profound bilateral lower extremity edema ABDOMEN: Abdomen distended, tender to light palpation, I do not hear any bowel sounds Musculoskeletal:no cyanosis of digits and no clubbing  NEURO: alert & oriented x 3 with fluent speech, no focal motor/sensory deficits   Labs:  Recent Labs    02/23/20 1113 02/26/20 1007 01/09/21 0429 01/10/21 0454 01/12/21 0439 01/12/21 1756 01/15/21 0506 01/16/21 0525 01/17/21 0421  NA 138   < > 139   < > 142   < > 139 138 134*  K 4.2   < > 4.8   < > 5.3*   < > 4.3 4.0 4.5  CL 102   < > 110   < > 116*   < > 110 106 103  CO2 19*   < > 22   < > 22   < > '25 24 23  '$ GLUCOSE 138*   < > 178*   < > 162*   < > 149* 174* 293*  BUN 15   < > 33*   < > 47*   < > 44* 46* 63*  CREATININE 0.80   < > 0.85   < > 0.78   < > 0.81 0.92 1.15*  CALCIUM 9.6   < > 9.0   < > 9.1   < > 9.0 8.8* 8.5*  GFRNONAA 79   < > >60   < > >60   < > >60 >60 53*  GFRAA 91  --   --   --   --   --   --   --   --   PROT 7.8   < > 5.1*  --  4.8*  --   --  5.2*  --   ALBUMIN 4.4   < > 2.1*  --  2.0*  --   --  2.0*  --   AST 14   < > 14*  --  23  --   --  16  --   ALT 19   < > 11  --  20  --   --  18  --   ALKPHOS 115   < > 57  --  72  --   --  114  --   BILITOT 0.6   < > 0.2*  --  0.4  --   --  0.4  --    < > = values in this interval not displayed.    Studies: I have reviewed her most recent x-ray DG Abd 1 View  Result Date: 01/16/2021 CLINICAL DATA:  NG tube placement EXAM: ABDOMEN - 1 VIEW COMPARISON:  01/12/2021 FINDINGS: Enteric tube terminates in the distal gastric body. Dilated loops of small bowel in the left mid abdomen. Surgical clips in the right upper abdomen. IMPRESSION: Enteric tube terminates in the distal gastric body. Electronically Signed   By: Julian Hy M.D.   On: 01/16/2021 01:51   DG Abd 1 View  Result Date: 01/03/2021 CLINICAL DATA:  Ileus EXAM: ABDOMEN - 1 VIEW COMPARISON:  Portable exam 0848 hours compared to 01/02/2021 FINDINGS: Tip of  nasogastric tube projects over stomach. Excreted contrast material within urinary bladder with faint contrast bilaterally  in dilated renal collecting systems. Single nonspecific air-filled nondistended small bowel loops in mid abdomen. Bowel gas pattern otherwise normal. Osseous structures unremarkable. Surgical clips RIGHT upper quadrant likely reflect prior cholecystectomy. IMPRESSION: BILATERAL hydronephrosis. Normal bowel gas pattern. Electronically Signed   By: Lavonia Dana M.D.   On: 01/03/2021 10:44   CT ABDOMEN PELVIS W CONTRAST  Result Date: 01/02/2021 CLINICAL DATA:  Endometrial cancer.  Bowel obstruction suspected. EXAM: CT ABDOMEN AND PELVIS WITH CONTRAST TECHNIQUE: Multidetector CT imaging of the abdomen and pelvis was performed using the standard protocol following bolus administration of intravenous contrast. CONTRAST:  38m OMNIPAQUE IOHEXOL 350 MG/ML SOLN COMPARISON:  11/02/2020 FINDINGS: Lower chest: Basilar atelectasis. Hepatobiliary: No suspicious focal abnormality within the liver parenchyma. Gallbladder is surgically absent. No intrahepatic or extrahepatic biliary dilation. Pancreas: No focal mass lesion. No dilatation of the main duct. No intraparenchymal cyst. No peripancreatic edema. Spleen: No splenomegaly. No focal mass lesion. Adrenals/Urinary Tract: No adrenal nodule or mass. Mild right and moderate left hydronephrosis evident with decreased perfusion to the left kidney. 1.3 cm lesion lower pole right kidney, previously characterized as hemorrhagic cyst. More subtle enhancing lesion identified anterior lower pole right kidney, previously characterized as suspicious for neoplasm on CT without and with contrast dated 03/28/2020. Both ureters are dilated down to the level of the pelvic sidewall. Bladder is unremarkable. Stomach/Bowel: Stomach is unremarkable. No gastric wall thickening. No evidence of outlet obstruction. Duodenum is normally positioned as is the ligament of Treitz. No  small bowel wall thickening. Small bowel is mildly distended up to 3 cm diameter and diffusely fluid-filled. The terminal ileum is normal. The appendix is normal. No gross colonic mass. No colonic wall thickening. Vascular/Lymphatic: There is mild atherosclerotic calcification of the abdominal aorta without aneurysm. Small lymph nodes are seen in the gastrohepatic and hepatoduodenal ligament. Small lymph nodes evident in the retroperitoneal space of the abdomen along the pelvic sidewall bilaterally. Reproductive: Calcified fibroid noted in the uterus. Endometrium not well visualized. There is no adnexal mass. Other: Interval progression ascites with progression of omental soft tissue nodularity now demonstrating areas of confluent "caking" (see midline image 46/2). Enhancing peritoneal nodules are seen adjacent to the inferior liver (35/2) and in the right pelvis (63/2). There are enhancing mesenteric nodules (see central mesentery on 52/2) with soft tissue nodularity in the region of the umbilicus measuring 2.3 x 2.2 cm on image 54/2. Moderate volume ascites seen around the liver and spleen and along the stomach with moderate volume ascites in the pelvis is well. Musculoskeletal: No worrisome lytic or sclerotic osseous abnormality. IMPRESSION: 1. Interval progression of ascites with progression of omental soft tissue nodularity now demonstrating areas of confluent caking. Peritoneal nodularity is also progressive in the interval. 2. Small bowel is mildly distended and fluid-filled up to 3 cm diameter and diffusely fluid-filled. Imaging features are compatible with ileus. Evolving obstruction not entirely excluded. 3. Interval worsening of mild right and moderate left hydronephrosis with decreased perfusion to the left kidney compatible with obstructive uropathy. Both ureters are obstructed at the level of the pelvic sidewall. 4. Subtle enhancing lesion anterior lower pole right kidney, previously characterized as  neoplasm on CT without and with contrast dated 03/28/2020. 5. Small lymph nodes in the gastrohepatic and hepatoduodenal ligament, in the retroperitoneal space of the abdomen, and along the pelvic sidewall bilaterally. 6. Calcified fibroid in the uterus. Endometrium not well visualized. 7. Aortic Atherosclerosis (ICD10-I70.0). Electronically Signed   By: EMisty StanleyM.D.   On: 01/02/2021  11:56   DG Knee Left Port  Result Date: 01/15/2021 CLINICAL DATA:  Left knee pain, limited range of motion EXAM: PORTABLE LEFT KNEE - 1-2 VIEW COMPARISON:  None. FINDINGS: Frontal and lateral views of the left knee are obtained. Lateral view is suboptimal due to patient cooperation and difficulty positioning. No fracture, subluxation, or dislocation. There is 3 compartmental osteoarthritis greatest in the medial compartment, with severe joint space narrowing, eburnation, and marginal osteophyte formation. There is diffuse subcutaneous edema. No joint effusion. IMPRESSION: 1. Severe 3 compartmental osteoarthritis greatest medially. 2. No evidence of acute fracture. 3. Diffuse subcutaneous edema. Electronically Signed   By: Randa Ngo M.D.   On: 01/15/2021 18:08   DG Abd 2 Views  Result Date: 01/12/2021 CLINICAL DATA:  Follow-up bowel obstruction EXAM: ABDOMEN - 2 VIEW COMPARISON:  KUB dated 1 day prior FINDINGS: Enteric catheter is in place with the tip and side hole projecting over the stomach. A right chest wall port is in place with the tip in the lower SVC. There is a nonobstructive bowel gas pattern. Right upper quadrant and pelvic surgical clips are noted. There is a trace left pleural effusion. There is no acute osseous abnormality. IMPRESSION: 1. Enteric catheter tip and side hole in the stomach. 2. Nonobstructive bowel gas pattern. 3. Trace left pleural effusion Electronically Signed   By: Valetta Mole M.D.   On: 01/12/2021 10:06   DG Abd 2 Views  Result Date: 01/05/2021 CLINICAL DATA:  Abdomen pain and emesis  EXAM: ABDOMEN - 2 VIEW COMPARISON:  01/03/2021, CT 01/02/2021 FINDINGS: Trace pleural effusion left lung base. No free air beneath the diaphragm. Increased small bowel distension, measuring up to 4.1 cm with multiple fluid levels. Paucity of distal gas. IMPRESSION: 1. Increased small bowel distension with multiple fluid levels and paucity of distal gas, suspect for bowel obstruction Electronically Signed   By: Donavan Foil M.D.   On: 01/05/2021 16:34   DG Abd 2 Views  Result Date: 12/22/2020 CLINICAL DATA:  Right lower quadrant abdominal pain with nausea, vomiting, and constipation. History of endometrial cancer. EXAM: ABDOMEN - 2 VIEW COMPARISON:  None. FINDINGS: The bowel gas pattern is normal. There is no evidence of free air. No radio-opaque calculi or other significant radiographic abnormality is seen. Surgical clips in the right upper quadrant and pelvis. No acute osseous abnormality. IMPRESSION: Negative. Electronically Signed   By: Titus Dubin M.D.   On: 12/22/2020 12:03   DG Abd Portable 1V  Result Date: 01/11/2021 CLINICAL DATA:  Bowel obstruction. EXAM: PORTABLE ABDOMEN - 1 VIEW COMPARISON:  01/08/2021 and CT abdomen pelvis 01/02/2021. FINDINGS: Image quality is degraded by body habitus. Nasogastric tube terminates in the stomach. Gas is seen in nondilated small bowel and some colon. Surgical clips in the right upper quadrant. IMPRESSION: No evidence of small-bowel obstruction. Electronically Signed   By: Lorin Picket M.D.   On: 01/11/2021 08:26   DG Abd Portable 1V  Result Date: 01/08/2021 CLINICAL DATA:  Nasogastric tube adjustment EXAM: PORTABLE ABDOMEN - 1 VIEW COMPARISON:  01/05/2021 FINDINGS: Nasogastric tube placement to the decompressed stomach. A few gas-filled nondilated small bowel loops in the mid abdomen. Colon decompressed. Cholecystectomy clips. IMPRESSION: Nasogastric tube to decompressed stomach. Electronically Signed   By: Lucrezia Europe M.D.   On: 01/08/2021 15:30   DG  Abd Portable 1 View  Result Date: 01/02/2021 CLINICAL DATA:  Check gastric catheter placement EXAM: PORTABLE ABDOMEN - 1 VIEW COMPARISON:  None. FINDINGS: Gastric catheter is  noted extending into the stomach. The proximal side port lies just beyond the gastroesophageal junction and could be advanced a few cm as necessary. Visualized lungs are hypoinflated but clear. Right chest wall port is noted. IMPRESSION: Gastric catheter in the stomach as described. Electronically Signed   By: Inez Catalina M.D.   On: 01/02/2021 14:01   [place] EKG SITE RITE  Result Date: 01/08/2021 If Site Rite image not attached, placement could not be confirmed due to current cardiac rhythm.

## 2021-01-17 NOTE — Progress Notes (Signed)
   01/16/21 0524 01/16/21 2110  Assess: MEWS Score  Temp 97.7 F (36.5 C) 97.6 F (36.4 C)  BP (!) 182/80 (!) 151/91  Pulse Rate (!) 121 (!) 118  Resp 20 18  SpO2 98 %  --   O2 Device Room Air  --   Assess: MEWS Score  MEWS Temp 0 0  MEWS Systolic 0 0  MEWS Pulse 2 2  MEWS RR 0 0  MEWS LOC 0 0  MEWS Score 2 2  MEWS Score Color Yellow Yellow  Assess: if the MEWS score is Yellow or Red  Were vital signs taken at a resting state? Yes  --   Focused Assessment No change from prior assessment  --   Does the patient meet 2 or more of the SIRS criteria? Yes  --   Does the patient have a confirmed or suspected source of infection? No  --   MEWS guidelines implemented *See Row Information* Yes  --   Treat  MEWS Interventions Administered prn meds/treatments;Escalated (See documentation below);Other (Comment) (notify provider and charge rn)  --   Pain Scale Faces  --   Faces Pain Scale 8  --   Escalate  MEWS: Escalate Yellow: discuss with charge nurse/RN and consider discussing with provider and RRT  --   Notify: Charge Nurse/RN  Name of Charge Nurse/RN Notified Doctor, general practice  --   Date Charge Nurse/RN Notified 01/16/21  --   Time Charge Nurse/RN Notified 0535  --   Notify: Provider  Provider Name/Title Jeannette Corpus NP  --   Date Provider Notified 01/16/21  --   Time Provider Notified [phone]  --   Notification Type Page  --   Notification Reason Other (Comment) (yellow mews)  --   Provider response Other (Comment) (no response)  --   Date of Provider Response 01/16/21  --   Assess: SIRS CRITERIA  SIRS Temperature  0 0  SIRS Pulse 1 1  SIRS Respirations  0 0  SIRS WBC 0 0  SIRS Score Sum  1 1

## 2021-01-17 NOTE — Progress Notes (Signed)
   01/17/21 0342  Assess: MEWS Score  Temp 97.9 F (36.6 C)  BP (!) 148/85  Pulse Rate (!) 161  Resp 20  SpO2 96 %  O2 Device Room Air  Assess: MEWS Score  MEWS Temp 0  MEWS Systolic 0  MEWS Pulse 3  MEWS RR 0  MEWS LOC 0  MEWS Score 3  MEWS Score Color Yellow  Assess: if the MEWS score is Yellow or Red  Were vital signs taken at a resting state? Yes  Focused Assessment No change from prior assessment  Does the patient meet 2 or more of the SIRS criteria? No  Does the patient have a confirmed or suspected source of infection? No  MEWS guidelines implemented *See Row Information* No, previously yellow, continue vital signs every 4 hours  Treat  MEWS Interventions Escalated (See documentation below)  Escalate  MEWS: Escalate Yellow: discuss with charge nurse/RN and consider discussing with provider and RRT  Notify: Provider  Provider Name/Title J. Olena Heckle, NP  Date Provider Notified 01/17/21  Time Provider Notified [phone]  Notification Type Page  Notification Reason Other (Comment) (HR 160s, continues in yellow mews)  Provider response Evaluate remotely;See new orders (1L bolus)  Date of Provider Response 01/17/21  Time of Provider Response 0347  Assess: SIRS CRITERIA  SIRS Temperature  0  SIRS Pulse 1  SIRS Respirations  0  SIRS WBC 0  SIRS Score Sum  1

## 2021-01-18 DIAGNOSIS — Z515 Encounter for palliative care: Secondary | ICD-10-CM | POA: Diagnosis not present

## 2021-01-18 DIAGNOSIS — C541 Malignant neoplasm of endometrium: Secondary | ICD-10-CM | POA: Diagnosis not present

## 2021-01-18 DIAGNOSIS — G893 Neoplasm related pain (acute) (chronic): Secondary | ICD-10-CM | POA: Diagnosis not present

## 2021-01-18 LAB — BASIC METABOLIC PANEL
Anion gap: 9 (ref 5–15)
BUN: 82 mg/dL — ABNORMAL HIGH (ref 8–23)
CO2: 25 mmol/L (ref 22–32)
Calcium: 8.4 mg/dL — ABNORMAL LOW (ref 8.9–10.3)
Chloride: 105 mmol/L (ref 98–111)
Creatinine, Ser: 1.54 mg/dL — ABNORMAL HIGH (ref 0.44–1.00)
GFR, Estimated: 37 mL/min — ABNORMAL LOW (ref >60)
Glucose, Bld: 186 mg/dL — ABNORMAL HIGH (ref 70–99)
Potassium: 4 mmol/L (ref 3.5–5.1)
Sodium: 139 mmol/L (ref 135–145)

## 2021-01-18 LAB — GLUCOSE, CAPILLARY
Glucose-Capillary: 161 mg/dL — ABNORMAL HIGH (ref 70–99)
Glucose-Capillary: 179 mg/dL — ABNORMAL HIGH (ref 70–99)
Glucose-Capillary: 193 mg/dL — ABNORMAL HIGH (ref 70–99)

## 2021-01-18 LAB — PROCALCITONIN: Procalcitonin: 59.51 ng/mL

## 2021-01-18 LAB — MAGNESIUM: Magnesium: 2.5 mg/dL — ABNORMAL HIGH (ref 1.7–2.4)

## 2021-01-18 LAB — PHOSPHORUS: Phosphorus: 3.7 mg/dL (ref 2.5–4.6)

## 2021-01-18 MED ORDER — LORAZEPAM 1 MG PO TABS
1.0000 mg | ORAL_TABLET | ORAL | Status: DC | PRN
  Administered 2021-01-19: 1 mg via ORAL
  Filled 2021-01-18: qty 1

## 2021-01-18 MED ORDER — GLYCOPYRROLATE 0.2 MG/ML IJ SOLN: 0.2000 mg | INTRAMUSCULAR | Status: DC | PRN

## 2021-01-18 MED ORDER — HALOPERIDOL LACTATE 2 MG/ML PO CONC
0.5000 mg | ORAL | Status: DC | PRN
  Filled 2021-01-18: qty 0.3

## 2021-01-18 MED ORDER — HALOPERIDOL 0.5 MG PO TABS: 0.5000 mg | ORAL_TABLET | ORAL | Status: DC | PRN

## 2021-01-18 MED ORDER — ONDANSETRON 4 MG PO TBDP: 4.0000 mg | ORAL_TABLET | Freq: Four times a day (QID) | ORAL | Status: DC | PRN

## 2021-01-18 MED ORDER — MORPHINE 100MG IN NS 100ML (1MG/ML) PREMIX INFUSION
1.0000 mg/h | INTRAVENOUS | Status: DC
  Administered 2021-01-18: 1 mg/h via INTRAVENOUS
  Filled 2021-01-18: qty 100

## 2021-01-18 MED ORDER — GLYCOPYRROLATE 1 MG PO TABS: 1.0000 mg | ORAL_TABLET | ORAL | Status: DC | PRN

## 2021-01-18 MED ORDER — BIOTENE DRY MOUTH MT LIQD: 15.0000 mL | OROMUCOSAL | Status: DC | PRN

## 2021-01-18 MED ORDER — ONDANSETRON HCL 4 MG/2ML IJ SOLN: 4.0000 mg | Freq: Four times a day (QID) | INTRAMUSCULAR | Status: DC | PRN

## 2021-01-18 MED ORDER — HALOPERIDOL LACTATE 5 MG/ML IJ SOLN
0.5000 mg | INTRAMUSCULAR | Status: DC | PRN
  Administered 2021-01-18: 0.5 mg via INTRAVENOUS
  Filled 2021-01-18: qty 1

## 2021-01-18 MED ORDER — POLYVINYL ALCOHOL 1.4 % OP SOLN: 1.0000 [drp] | Freq: Four times a day (QID) | OPHTHALMIC | Status: DC | PRN

## 2021-01-18 MED ORDER — DICLOFENAC SODIUM 1 % EX GEL
4.0000 g | Freq: Four times a day (QID) | CUTANEOUS | Status: DC | PRN
  Filled 2021-01-18: qty 100

## 2021-01-18 MED ORDER — LORAZEPAM 2 MG/ML IJ SOLN: 1.0000 mg | INTRAMUSCULAR | Status: DC | PRN

## 2021-01-18 MED ORDER — SODIUM CHLORIDE 0.9 % IV SOLN
1.0000 mg/h | INTRAVENOUS | Status: DC
  Filled 2021-01-18: qty 10

## 2021-01-18 MED ORDER — ACETAMINOPHEN 650 MG RE SUPP: 650.0000 mg | Freq: Four times a day (QID) | RECTAL | Status: DC | PRN

## 2021-01-18 MED ORDER — MORPHINE 100MG IN NS 100ML (1MG/ML) PREMIX INFUSION
1.0000 mg/h | INTRAVENOUS | Status: DC
  Administered 2021-01-19: 1 mg/h via INTRAVENOUS
  Filled 2021-01-18: qty 100

## 2021-01-18 MED ORDER — PROCHLORPERAZINE EDISYLATE 10 MG/2ML IJ SOLN
10.0000 mg | Freq: Four times a day (QID) | INTRAMUSCULAR | Status: DC
  Administered 2021-01-18 – 2021-01-19 (×4): 10 mg via INTRAVENOUS
  Filled 2021-01-18 (×4): qty 2

## 2021-01-18 MED ORDER — ACETAMINOPHEN 325 MG PO TABS: 650.0000 mg | ORAL_TABLET | Freq: Four times a day (QID) | ORAL | Status: DC | PRN

## 2021-01-18 MED ORDER — LORAZEPAM 2 MG/ML PO CONC: 1.0000 mg | ORAL | Status: DC | PRN

## 2021-01-18 MED ORDER — LORAZEPAM 2 MG/ML IJ SOLN
1.0000 mg | Freq: Three times a day (TID) | INTRAMUSCULAR | Status: DC
  Administered 2021-01-18 – 2021-01-19 (×4): 1 mg via INTRAVENOUS
  Filled 2021-01-18 (×4): qty 1

## 2021-01-18 NOTE — Progress Notes (Signed)
Nutrition Brief Note  Chart reviewed. Pt now transitioning to comfort care.  No further nutrition interventions planned at this time.   Veta Dambrosia, MS, RD, LDN Inpatient Clinical Dietitian Contact information available via Amion   

## 2021-01-18 NOTE — Progress Notes (Signed)
Manufacturing engineer [facility]) Hospital Liaison note.     Received request from [place] for family interest in [facility]. Patient information has been forwarded to [facility] for review.      [place] liaison will follow up once eligibility and availability have been determined.    A Please do not hesitate to call with questions.      Thank you,      Farrel Gordon, RN, [place] (listed on [facility] under [place])       [phone]

## 2021-01-18 NOTE — Progress Notes (Signed)
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 She had no clinical benefit from chemotherapy 2 weeks ago  I have a long discussion with the patient and her daughter Were in agreement to transition her care to comfort measures only  cancer associated pain She has multifactorial pain In the setting of terminal illness, I recommend continuous morphine infusion I will start at 1 mg/h, to be titrated as needed over the next few days to her pain   Nausea with vomiting/ileus secondary to abdominal carcinomatosis This is worsening due to disease Unfortunately, due to pancytopenia, I do not feel it is safe to proceed with venting gastrostomy tube We will continue NG tube with suction for now I will prescribe scheduled lorazepam and we will continue on scheduled IV antiemetics with Compazine  Severe protein calorie malnutrition She is not gaining benefit from TPN We will discontinue today  Acute on chronic renal failure Due to worsening disease As above, will transition her care to comfort measures   Acquired pancytopenia Due to recent chemo We will stop checking labs   Goals of care Due to lack of improvement, we discussed transitioning her care to DNR The patient and daughter agreed We will focus on comfort measures only   Discharge planning We will consult care management team to see if residential hospice is available She is not suitable to go home on home-based palliative care and hospice If her condition deteriorates over the next 24 to 48 hours, I would anticipate hospital death and in the situation, we will just keep her here I will sign off Plan of care was discussed with primary service  All questions were answered. The patient knows to call the clinic with any problems, questions or concerns.   The total time spent in the appointment was 30 minutes encounter with patients  including review of chart and various tests results, discussions about plan of care and coordination of care plan  [name], MD 01/18/2021 10:54 AM  Subjective:  She had CT imaging due to worsening pain and nausea No bowel movement in 2 weeks She is feeling worse with constant vomiting She also have persistent abdominal pain, severe Daughter is by the bedside Noted worsening renal failure in her labs  Objective:  Vitals:   01/17/21 2047 01/18/21 0441  BP: 123/71 (!) 143/70  Pulse: (!) 138 (!) 124  Resp: 18 18  Temp: 98.6 F (37 C) 98 F (36.7 C)  SpO2: 99% 98%     Intake/Output Summary (Last 24 hours) at 01/18/2021 1054 Last data filed at 01/18/2021 0600 Gross per 24 hour  Intake 1368.9 ml  Output 1350 ml  Net 18.9 ml    GENERAL:alert, miserable with pain and nausea   Labs:  Recent Labs    02/23/20 1113 02/26/20 1007 01/09/21 0429 01/10/21 0454 01/12/21 0439 01/12/21 1756 01/16/21 0525 01/17/21 0421 01/18/21 0458  NA 138   < > 139   < > 142   < > 138 134* 139  K 4.2   < > 4.8   < > 5.3*   < > 4.0 4.5 4.0  CL 102   < > 110   < > 116*   < > 106 103 105  CO2 19*   < > 22   < > 22   < > '24 23 25  '$ GLUCOSE 138*   < > 178*   < >  162*   < > 174* 293* 186*  BUN 15   < > 33*   < > 47*   < > 46* 63* 82*  CREATININE 0.80   < > 0.85   < > 0.78   < > 0.92 1.15* 1.54*  CALCIUM 9.6   < > 9.0   < > 9.1   < > 8.8* 8.5* 8.4*  GFRNONAA 79   < > >60   < > >60   < > >60 53* 37*  GFRAA 91  --   --   --   --   --   --   --   --   PROT 7.8   < > 5.1*  --  4.8*  --  5.2*  --   --   ALBUMIN 4.4   < > 2.1*  --  2.0*  --  2.0*  --   --   AST 14   < > 14*  --  23  --  16  --   --   ALT 19   < > 11  --  20  --  18  --   --   ALKPHOS 115   < > 57  --  72  --  114  --   --   BILITOT 0.6   < > 0.2*  --  0.4  --  0.4  --   --    < > = values in this interval not displayed.    Studies:  CT ABDOMEN PELVIS WO CONTRAST  Result Date: 01/17/2021 CLINICAL DATA:  Abdominal distension. Bowel  obstruction suspected. Nausea and vomiting. history of endometrial cancer, diabetes, hypertension and COVID-19 infection. EXAM: CT ABDOMEN AND PELVIS WITHOUT CONTRAST TECHNIQUE: Multidetector CT imaging of the abdomen and pelvis was performed following the standard protocol without IV contrast. COMPARISON:  CT chest abdomen pelvis 01/02/2021 FINDINGS: Lines and tubes: Port-A-Cath with tip at the superior cavoatrial junction. Enteric tube with side port at the gastroesophageal junction and tip within the gastric lumen. Lower chest: Bilateral trace pleural effusions with bibasilar atelectasis. Hepatobiliary: The liver is enlarged measuring up to 20 cm. No focal liver abnormality. Status post cholecystectomy. No biliary dilatation. Pancreas: No focal lesion. Normal pancreatic contour. No surrounding inflammatory changes. No main pancreatic ductal dilatation. Spleen: Normal in size without focal abnormality. Adrenals/Urinary Tract: No adrenal nodule bilaterally. The left kidney renal parenchyma is slightly hyperdense compared to the right in may be due to previously administered intravenous contrast that is not being properly excreted. Similar-appearing bilateral, left greater than right, moderate hydroureteronephrosis. There is a 1.4 cm hyperdense lesion along the right kidney with a density of 65 Hounsfield units. Couple of too small to characterize hyperdense subcentimeter densities within the left kidney. Otherwise no definite contour-deforming renal mass. No ureterolithiasis or hydroureter. The urinary bladder is unremarkable. Stomach/Bowel: Stomach is within normal limits. Similar-appearing small bowel distended with fluid measuring up to 3 cm. No definite small bowel dilatation. No definite pneumatosis. No evidence of bowel wall thickening or dilatation. The majority of the large bowel is collapsed. The appendix is normal in caliber. Vascular/Lymphatic: No abdominal aorta or iliac aneurysm. Mild atherosclerotic  plaque of the aorta and its branches. No abdominal, pelvic, or inguinal lymphadenopathy. Reproductive: Poorly visualized uterus in a patient with known endometrial cancer. Calcified lesion within the uterine fundus wall likely represents a degenerative fibroid. Bilateral adnexa are unremarkable. Other: Some increased in volume of small to moderate free fluid ascites.  No intraperitoneal free gas. No organized fluid collection. There is a nodular like soft tissue density lesion measuring up to 1.4 cm in short axis within the right pelvis/adnexal regions that may represent a peritoneal nodule (2:69). Known peritoneal carcinomatosis again noted the poorly visualized on this noncontrast study. Musculoskeletal: No abdominal wall hernia or abnormality. No suspicious lytic or blastic osseous lesions. No acute displaced fracture. Multilevel degenerative changes of the spine. IMPRESSION: 1. Markedly limited evaluation on this noncontrast study in a patient with known endometrial cancer and peritoneal carcinomatosis. 2. Persistent small bowel distension up to 3 cm with no definite bowel obstruction or pneumatosis. Bowel ischemia cannot be evaluated for on this noncontrast study. 3. Similar-appearing bilateral, left greater than right, moderate hydroureteronephrosis. Associated hyperdense left renal parenchyma compared to the right. This reflects chronic changes of obstructive uropathy. 4. Slight interval increase in volume of small to moderate ascites. 5. Hepatomegaly. 6. Degenerative uterine fibroid. 7. Indeterminate 1.4 cm right renal lesion. Subcentimeter hyperdense lesions within the left kidney are too small to characterize. Consider nonemergent outpatient MRI renal protocol for further evaluation. When the patient is clinically stable and able to follow directions and hold their breath (preferably as an outpatient) further evaluation with dedicated abdominal MRI should be considered. 8.  Aortic Atherosclerosis  (ICD10-I70.0). Electronically Signed   By: Iven Finn M.D.   On: 01/17/2021 16:53   DG Chest 2 View  Result Date: 01/17/2021 CLINICAL DATA:  Complains of weakness, abdominal pain, finger tip pain. Shortness of breath. History of endometrial carcinoma. EXAM: CHEST - 2 VIEW COMPARISON:  CT chest 11/02/2020, CT abdomen pelvis 01/17/2021 FINDINGS: Access right chest wall Port-A-Cath with tip terminating in the region of the superior cavoatrial junction. Enteric tube coursing below the hemidiaphragm with tip overlying the expected region of the gastric lumen and side port at the gastroesophageal junction. The heart and mediastinal contours are within normal limits. Bibasilar atelectasis. No focal consolidation. No pulmonary edema. Bilateral trace pleural effusions. No pneumothorax. No acute osseous abnormality. . IMPRESSION: 1. Bilateral trace pleural effusions with bibasilar atelectasis. 2. Enteric tube with tip coursing below the hemidiaphragm overlying the gastric lumen and side port at the gastroesophageal junction. Consider advancing by 3cm. Electronically Signed   By: Iven Finn M.D.   On: 01/17/2021 16:32   DG Abd 1 View  Result Date: 01/16/2021 CLINICAL DATA:  NG tube placement EXAM: ABDOMEN - 1 VIEW COMPARISON:  01/12/2021 FINDINGS: Enteric tube terminates in the distal gastric body. Dilated loops of small bowel in the left mid abdomen. Surgical clips in the right upper abdomen. IMPRESSION: Enteric tube terminates in the distal gastric body. Electronically Signed   By: Julian Hy M.D.   On: 01/16/2021 01:51   DG Abd 1 View  Result Date: 01/03/2021 CLINICAL DATA:  Ileus EXAM: ABDOMEN - 1 VIEW COMPARISON:  Portable exam 0848 hours compared to 01/02/2021 FINDINGS: Tip of nasogastric tube projects over stomach. Excreted contrast material within urinary bladder with faint contrast bilaterally in dilated renal collecting systems. Single nonspecific air-filled nondistended small bowel loops  in mid abdomen. Bowel gas pattern otherwise normal. Osseous structures unremarkable. Surgical clips RIGHT upper quadrant likely reflect prior cholecystectomy. IMPRESSION: BILATERAL hydronephrosis. Normal bowel gas pattern. Electronically Signed   By: Lavonia Dana M.D.   On: 01/03/2021 10:44   CT ABDOMEN PELVIS W CONTRAST  Result Date: 01/02/2021 CLINICAL DATA:  Endometrial cancer.  Bowel obstruction suspected. EXAM: CT ABDOMEN AND PELVIS WITH CONTRAST TECHNIQUE: Multidetector CT imaging of the abdomen  and pelvis was performed using the standard protocol following bolus administration of intravenous contrast. CONTRAST:  70m OMNIPAQUE IOHEXOL 350 MG/ML SOLN COMPARISON:  11/02/2020 FINDINGS: Lower chest: Basilar atelectasis. Hepatobiliary: No suspicious focal abnormality within the liver parenchyma. Gallbladder is surgically absent. No intrahepatic or extrahepatic biliary dilation. Pancreas: No focal mass lesion. No dilatation of the main duct. No intraparenchymal cyst. No peripancreatic edema. Spleen: No splenomegaly. No focal mass lesion. Adrenals/Urinary Tract: No adrenal nodule or mass. Mild right and moderate left hydronephrosis evident with decreased perfusion to the left kidney. 1.3 cm lesion lower pole right kidney, previously characterized as hemorrhagic cyst. More subtle enhancing lesion identified anterior lower pole right kidney, previously characterized as suspicious for neoplasm on CT without and with contrast dated 03/28/2020. Both ureters are dilated down to the level of the pelvic sidewall. Bladder is unremarkable. Stomach/Bowel: Stomach is unremarkable. No gastric wall thickening. No evidence of outlet obstruction. Duodenum is normally positioned as is the ligament of Treitz. No small bowel wall thickening. Small bowel is mildly distended up to 3 cm diameter and diffusely fluid-filled. The terminal ileum is normal. The appendix is normal. No gross colonic mass. No colonic wall thickening.  Vascular/Lymphatic: There is mild atherosclerotic calcification of the abdominal aorta without aneurysm. Small lymph nodes are seen in the gastrohepatic and hepatoduodenal ligament. Small lymph nodes evident in the retroperitoneal space of the abdomen along the pelvic sidewall bilaterally. Reproductive: Calcified fibroid noted in the uterus. Endometrium not well visualized. There is no adnexal mass. Other: Interval progression ascites with progression of omental soft tissue nodularity now demonstrating areas of confluent "caking" (see midline image 46/2). Enhancing peritoneal nodules are seen adjacent to the inferior liver (35/2) and in the right pelvis (63/2). There are enhancing mesenteric nodules (see central mesentery on 52/2) with soft tissue nodularity in the region of the umbilicus measuring 2.3 x 2.2 cm on image 54/2. Moderate volume ascites seen around the liver and spleen and along the stomach with moderate volume ascites in the pelvis is well. Musculoskeletal: No worrisome lytic or sclerotic osseous abnormality. IMPRESSION: 1. Interval progression of ascites with progression of omental soft tissue nodularity now demonstrating areas of confluent caking. Peritoneal nodularity is also progressive in the interval. 2. Small bowel is mildly distended and fluid-filled up to 3 cm diameter and diffusely fluid-filled. Imaging features are compatible with ileus. Evolving obstruction not entirely excluded. 3. Interval worsening of mild right and moderate left hydronephrosis with decreased perfusion to the left kidney compatible with obstructive uropathy. Both ureters are obstructed at the level of the pelvic sidewall. 4. Subtle enhancing lesion anterior lower pole right kidney, previously characterized as neoplasm on CT without and with contrast dated 03/28/2020. 5. Small lymph nodes in the gastrohepatic and hepatoduodenal ligament, in the retroperitoneal space of the abdomen, and along the pelvic sidewall  bilaterally. 6. Calcified fibroid in the uterus. Endometrium not well visualized. 7. Aortic Atherosclerosis (ICD10-I70.0). Electronically Signed   By: EMisty StanleyM.D.   On: 01/02/2021 11:56   DG Knee Left Port  Result Date: 01/15/2021 CLINICAL DATA:  Left knee pain, limited range of motion EXAM: PORTABLE LEFT KNEE - 1-2 VIEW COMPARISON:  None. FINDINGS: Frontal and lateral views of the left knee are obtained. Lateral view is suboptimal due to patient cooperation and difficulty positioning. No fracture, subluxation, or dislocation. There is 3 compartmental osteoarthritis greatest in the medial compartment, with severe joint space narrowing, eburnation, and marginal osteophyte formation. There is diffuse subcutaneous edema. No joint effusion. IMPRESSION:  1. Severe 3 compartmental osteoarthritis greatest medially. 2. No evidence of acute fracture. 3. Diffuse subcutaneous edema. Electronically Signed   By: Randa Ngo M.D.   On: 01/15/2021 18:08   DG Abd 2 Views  Result Date: 01/12/2021 CLINICAL DATA:  Follow-up bowel obstruction EXAM: ABDOMEN - 2 VIEW COMPARISON:  KUB dated 1 day prior FINDINGS: Enteric catheter is in place with the tip and side hole projecting over the stomach. A right chest wall port is in place with the tip in the lower SVC. There is a nonobstructive bowel gas pattern. Right upper quadrant and pelvic surgical clips are noted. There is a trace left pleural effusion. There is no acute osseous abnormality. IMPRESSION: 1. Enteric catheter tip and side hole in the stomach. 2. Nonobstructive bowel gas pattern. 3. Trace left pleural effusion Electronically Signed   By: Valetta Mole M.D.   On: 01/12/2021 10:06   DG Abd 2 Views  Result Date: 01/05/2021 CLINICAL DATA:  Abdomen pain and emesis EXAM: ABDOMEN - 2 VIEW COMPARISON:  01/03/2021, CT 01/02/2021 FINDINGS: Trace pleural effusion left lung base. No free air beneath the diaphragm. Increased small bowel distension, measuring up to 4.1 cm  with multiple fluid levels. Paucity of distal gas. IMPRESSION: 1. Increased small bowel distension with multiple fluid levels and paucity of distal gas, suspect for bowel obstruction Electronically Signed   By: Donavan Foil M.D.   On: 01/05/2021 16:34   DG Abd 2 Views  Result Date: 12/22/2020 CLINICAL DATA:  Right lower quadrant abdominal pain with nausea, vomiting, and constipation. History of endometrial cancer. EXAM: ABDOMEN - 2 VIEW COMPARISON:  None. FINDINGS: The bowel gas pattern is normal. There is no evidence of free air. No radio-opaque calculi or other significant radiographic abnormality is seen. Surgical clips in the right upper quadrant and pelvis. No acute osseous abnormality. IMPRESSION: Negative. Electronically Signed   By: Titus Dubin M.D.   On: 12/22/2020 12:03   DG Abd Portable 1V  Result Date: 01/11/2021 CLINICAL DATA:  Bowel obstruction. EXAM: PORTABLE ABDOMEN - 1 VIEW COMPARISON:  01/08/2021 and CT abdomen pelvis 01/02/2021. FINDINGS: Image quality is degraded by body habitus. Nasogastric tube terminates in the stomach. Gas is seen in nondilated small bowel and some colon. Surgical clips in the right upper quadrant. IMPRESSION: No evidence of small-bowel obstruction. Electronically Signed   By: Lorin Picket M.D.   On: 01/11/2021 08:26   DG Abd Portable 1V  Result Date: 01/08/2021 CLINICAL DATA:  Nasogastric tube adjustment EXAM: PORTABLE ABDOMEN - 1 VIEW COMPARISON:  01/05/2021 FINDINGS: Nasogastric tube placement to the decompressed stomach. A few gas-filled nondilated small bowel loops in the mid abdomen. Colon decompressed. Cholecystectomy clips. IMPRESSION: Nasogastric tube to decompressed stomach. Electronically Signed   By: Lucrezia Europe M.D.   On: 01/08/2021 15:30   DG Abd Portable 1 View  Result Date: 01/02/2021 CLINICAL DATA:  Check gastric catheter placement EXAM: PORTABLE ABDOMEN - 1 VIEW COMPARISON:  None. FINDINGS: Gastric catheter is noted extending into the  stomach. The proximal side port lies just beyond the gastroesophageal junction and could be advanced a few cm as necessary. Visualized lungs are hypoinflated but clear. Right chest wall port is noted. IMPRESSION: Gastric catheter in the stomach as described. Electronically Signed   By: Inez Catalina M.D.   On: 01/02/2021 14:01   [place] EKG SITE RITE  Result Date: 01/08/2021 If Site Rite image not attached, placement could not be confirmed due to current cardiac rhythm.

## 2021-01-18 NOTE — Progress Notes (Signed)
OT Cancellation Note  Patient Details Name: Amanda Lin MRN: [id number] DOB: [birth year]   Cancelled Treatment:    Reason Eval/Treat Not Completed: Other (comment) Patient is transitioning to hospice services at this time.  Jackelyn Poling OTR/L, [facility] Office# [phone] Pager# [phone]  01/18/2021, 1:43 PM

## 2021-01-18 NOTE — Progress Notes (Signed)
PROGRESS NOTE  Amanda Lin [id number] DOB: 08-14-1955 DOA: 01/02/2021 PCP: Nicolette Bang, MD   LOS: 16 days   Brief Narrative / Interim history: 65 year old lady with prior history of endometrial cancer, diabetes, hypertension and COVID-19 infection presents with abdominal pain and nausea and vomiting for about 3 weeks.  CT of the abdomen pelvis shows ileus and possible evolving bowel obstruction, progression of ascites and omental soft tissue nodularity, worsening bilateral hydronephrosis with obstructive uropathy.  Patient has been having persistent small bowel obstruction and failed several times NG tube clamping.  Oncology consulted and underwent chemotherapy couple weeks ago while hospitalized.  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  After discussion about goals of care will transition to comfort measures only  Active Problems Cancer associated pain-on continuous morphine infusion Nausea/vomiting/ileus/SBO secondary to abdominal carcinomatosis-not reversible Acute kidney injury, bilateral hydronephrosis left greater than right on chronic kidney disease stage IIIa-comfort measures, renal function getting worse Severe protein calorie malnutrition-no further benefit from TPN at this point, discontinue today Chemotherapy-induced pancytopenia Goals of care-comfort measures only. Residential hospice planning   Scheduled Meds:  Chlorhexidine Gluconate Cloth  6 each Topical Daily   cloNIDine  0.1 mg Transdermal Weekly   diclofenac Sodium  4 g Topical QID   LORazepam  1 mg Intravenous TID   pantoprazole (PROTONIX) IV  40 mg Intravenous Q24H   prochlorperazine  10 mg Intravenous Q6H   sodium chloride flush   10-40 mL Intracatheter Q12H   Continuous Infusions:  morphine     PRN Meds:.acetaminophen **OR** acetaminophen, acetaminophen **OR** acetaminophen, alum & mag hydroxide-simeth, antiseptic oral rinse, bisacodyl, glycopyrrolate **OR** glycopyrrolate **OR** glycopyrrolate, haloperidol **OR** haloperidol **OR** haloperidol lactate, LORazepam **OR** LORazepam **OR** LORazepam, [DISCONTINUED] ondansetron **OR** ondansetron (ZOFRAN) IV, ondansetron **OR** ondansetron (ZOFRAN) IV, phenol, polyvinyl alcohol, sodium chloride flush    DVT prophylaxis: SCDs Start: 01/02/21 1225     Code Status: DNR  Family Communication: No family at bedside  Status is: Inpatient  Remains inpatient appropriate because:Inpatient level of care appropriate due to severity of illness  Dispo: The patient is from: Home              Anticipated d/c is to:  Residential hospice              Patient currently is not medically stable to d/c.   Difficult to place patient No   Level of care: Med-Surg  Consultants:  Oncology Palliative care  Procedures:  none  Microbiology  none  Antimicrobials: none    Objective: Vitals:   01/17/21 0724 01/17/21 1856 01/17/21 2047 01/18/21 0441  BP:  124/62 123/71 (!) 143/70  Pulse:  (!) 138 (!) 138 (!) 124  Resp:  '16 18 18  '$ Temp:  (!) 97.4 F (36.3 C) 98.6 F (37 C) 98 F (36.7 C)  TempSrc:  Oral Oral Oral  SpO2:  96% 99% 98%  Weight: 105.5 kg   104.4 kg  Height:        Intake/Output Summary (Last 24 hours) at 01/18/2021 1419 Last data filed at 01/18/2021 0600 Gross per 24 hour  Intake 1368.9 ml  Output 1350 ml  Net 18.9 ml   Filed Weights   01/14/21 0500 01/17/21 0724 01/18/21 0441  Weight: 101.4 kg 105.5 kg 104.4 kg    Examination:  Constitutional:  NAD, appears quite uncomfortable Eyes: no scleral icterus ENMT: Mucous membranes are moist.  Neck: normal, supple Respiratory: clear to auscultation bilaterally, no wheezing, no crackles.  N Cardiovascular: Regular rate and rhythm, no murmurs / rubs / gallops.  1+ edema Abdomen: Diffusely tender to palpation, diminished bowel sounds Musculoskeletal: no clubbing / cyanosis.  Skin: no rashes Neurologic: Nonfocal, generalized weakness present   Data Reviewed: I have independently reviewed following labs and imaging studies   CBC: Recent Labs  Lab 01/12/21 1756 01/17/21 0421  WBC 4.0 0.9*  NEUTROABS  --  0.2*  HGB 8.1* 11.2*  HCT 25.5* 34.6*  MCV 90.7 88.5  PLT 123* [id number]   Basic Metabolic Panel: Recent Labs  Lab 01/14/21 0500 01/15/21 0506 01/16/21 0525 01/17/21 0421 01/18/21 0458  NA 137 139 138 134* 139  K 4.4 4.3 4.0 4.5 4.0  CL 107 110 106 103 105  CO2 '23 25 24 23 25  '$ GLUCOSE 170* 149* 174* 293* 186*  BUN 45* 44* 46* 63* 82*  CREATININE 0.75 0.81 0.92 1.15* 1.54*  CALCIUM 9.2 9.0 8.8* 8.5* 8.4*  MG 2.0 1.9 2.2 2.3 2.5*  PHOS 3.4 3.4 3.5 3.4 3.7   Liver Function Tests: Recent Labs  Lab 01/12/21 0439 01/16/21 0525  AST 23 16  ALT 20 18  ALKPHOS 72 114  BILITOT 0.4 0.4  PROT 4.8* 5.2*  ALBUMIN 2.0* 2.0*   Coagulation Profile: No results for input(s): INR, PROTIME in the last 168 hours. HbA1C: No results for input(s): HGBA1C in the last 72 hours. CBG: Recent Labs  Lab 01/17/21 1847 01/17/21 2048 01/18/21 0016 01/18/21 0443 01/18/21 0736  GLUCAP 252* 229* 193* 179* 161*    No results found for this or any previous visit (from the past 240 hour(s)).   Radiology Studies: CT ABDOMEN PELVIS WO CONTRAST  Result Date: 01/17/2021 CLINICAL DATA:  Abdominal distension. Bowel obstruction suspected. Nausea and vomiting. history of endometrial cancer, diabetes, hypertension and COVID-19 infection. EXAM: CT ABDOMEN AND PELVIS WITHOUT CONTRAST TECHNIQUE: Multidetector CT imaging of the abdomen and pelvis was performed following the standard protocol without IV contrast. COMPARISON:  CT chest abdomen pelvis 01/02/2021 FINDINGS: Lines and tubes:  Port-A-Cath with tip at the superior cavoatrial junction. Enteric tube with side port at the gastroesophageal junction and tip within the gastric lumen. Lower chest: Bilateral trace pleural effusions with bibasilar atelectasis. Hepatobiliary: The liver is enlarged measuring up to 20 cm. No focal liver abnormality. Status post cholecystectomy. No biliary dilatation. Pancreas: No focal lesion. Normal pancreatic contour. No surrounding inflammatory changes. No main pancreatic ductal dilatation. Spleen: Normal in size without focal abnormality. Adrenals/Urinary Tract: No adrenal nodule bilaterally. The left kidney renal parenchyma is slightly hyperdense compared to the right in may be due to previously administered intravenous contrast that is not being properly excreted. Similar-appearing bilateral, left greater than right, moderate hydroureteronephrosis. There is a 1.4 cm hyperdense lesion along the right kidney with a density of 65 Hounsfield units. Couple of too small to characterize hyperdense subcentimeter densities within the left kidney. Otherwise no definite contour-deforming renal mass. No ureterolithiasis or hydroureter. The urinary bladder is unremarkable. Stomach/Bowel: Stomach is within normal limits. Similar-appearing small bowel distended with fluid measuring up to 3 cm. No definite small bowel dilatation. No definite pneumatosis. No evidence of bowel wall thickening or dilatation. The majority of the large bowel is collapsed. The appendix is normal in caliber. Vascular/Lymphatic: No abdominal aorta or iliac aneurysm. Mild atherosclerotic plaque of the aorta and its branches.  No abdominal, pelvic, or inguinal lymphadenopathy. Reproductive: Poorly visualized uterus in a patient with known endometrial cancer. Calcified lesion within the uterine fundus wall likely represents a degenerative fibroid. Bilateral adnexa are unremarkable. Other: Some increased in volume of small to moderate free fluid ascites. No  intraperitoneal free gas. No organized fluid collection. There is a nodular like soft tissue density lesion measuring up to 1.4 cm in short axis within the right pelvis/adnexal regions that may represent a peritoneal nodule (2:69). Known peritoneal carcinomatosis again noted the poorly visualized on this noncontrast study. Musculoskeletal: No abdominal wall hernia or abnormality. No suspicious lytic or blastic osseous lesions. No acute displaced fracture. Multilevel degenerative changes of the spine. IMPRESSION: 1. Markedly limited evaluation on this noncontrast study in a patient with known endometrial cancer and peritoneal carcinomatosis. 2. Persistent small bowel distension up to 3 cm with no definite bowel obstruction or pneumatosis. Bowel ischemia cannot be evaluated for on this noncontrast study. 3. Similar-appearing bilateral, left greater than right, moderate hydroureteronephrosis. Associated hyperdense left renal parenchyma compared to the right. This reflects chronic changes of obstructive uropathy. 4. Slight interval increase in volume of small to moderate ascites. 5. Hepatomegaly. 6. Degenerative uterine fibroid. 7. Indeterminate 1.4 cm right renal lesion. Subcentimeter hyperdense lesions within the left kidney are too small to characterize. Consider nonemergent outpatient MRI renal protocol for further evaluation. When the patient is clinically stable and able to follow directions and hold their breath (preferably as an outpatient) further evaluation with dedicated abdominal MRI should be considered. 8.  Aortic Atherosclerosis (ICD10-I70.0). Electronically Signed   By: Iven Finn M.D.   On: 01/17/2021 16:53   DG Chest 2 View  Result Date: 01/17/2021 CLINICAL DATA:  Complains of weakness, abdominal pain, finger tip pain. Shortness of breath. History of endometrial carcinoma. EXAM: CHEST - 2 VIEW COMPARISON:  CT chest 11/02/2020, CT abdomen pelvis 01/17/2021 FINDINGS: Access right chest wall  Port-A-Cath with tip terminating in the region of the superior cavoatrial junction. Enteric tube coursing below the hemidiaphragm with tip overlying the expected region of the gastric lumen and side port at the gastroesophageal junction. The heart and mediastinal contours are within normal limits. Bibasilar atelectasis. No focal consolidation. No pulmonary edema. Bilateral trace pleural effusions. No pneumothorax. No acute osseous abnormality. . IMPRESSION: 1. Bilateral trace pleural effusions with bibasilar atelectasis. 2. Enteric tube with tip coursing below the hemidiaphragm overlying the gastric lumen and side port at the gastroesophageal junction. Consider advancing by 3cm. Electronically Signed   By: Iven Finn M.D.   On: 01/17/2021 16:32     Marzetta Board, MD, PhD [facility]  Between 7 am - 7 pm I am available, please contact me via Amion (for emergencies) or Securechat (non urgent messages)  Between 7 pm - 7 am I am not available, please contact night coverage MD/APP via Amion

## 2021-01-18 NOTE — Progress Notes (Signed)
Daily Progress Note   Patient Name: Amanda Lin       Date: 01/18/2021 DOB: 1956-01-08  Age: 65 y.o. MRN#: [id number] Attending Physician: Caren Griffins, MD Primary Care Physician: Nicolette Bang, MD Admit Date: 01/02/2021  Reason for Consultation/Follow-up: Establishing goals of care: Now on comfort measures.  Subjective: Patient is sitting up in bed, she is not awake or alert.  She has NG tube.  Her daughter is present at the bedside.  Patient and her daughter have a recently discussed with Dr. Alvy Bimler from oncology this morning and currently plan is to transition to comfort measures, initiation of continuous opioids and consideration for transfer to residential hospice is being explored.   Length of Stay: 16  Current Medications: Scheduled Meds:   Chlorhexidine Gluconate Cloth  6 each Topical Daily   cloNIDine  0.1 mg Transdermal Weekly   diclofenac Sodium  4 g Topical QID   LORazepam  1 mg Intravenous TID   pantoprazole (PROTONIX) IV  40 mg Intravenous Q24H   prochlorperazine  10 mg Intravenous Q6H   sodium chloride flush  10-40 mL Intracatheter Q12H    Continuous Infusions:  morphine 1 mg/hr (01/18/21 1115)    PRN Meds: acetaminophen **OR** acetaminophen, acetaminophen **OR** acetaminophen, alum & mag hydroxide-simeth, antiseptic oral rinse, bisacodyl, glycopyrrolate **OR** glycopyrrolate **OR** glycopyrrolate, haloperidol **OR** haloperidol **OR** haloperidol lactate, LORazepam **OR** LORazepam **OR** LORazepam, [DISCONTINUED] ondansetron **OR** ondansetron (ZOFRAN) IV, ondansetron **OR** ondansetron (ZOFRAN) IV, phenol, polyvinyl alcohol, sodium chloride flush  Physical Exam         Patient is sitting up in bed Has NG tube Has mild to moderate  generalized distress Has extensive edema upper and lower extremities Appears chronically ill Not as alert Abdomen is mildly distended   Vital Signs: BP (!) 143/70 (BP Location: Left Arm)   Pulse (!) 124   Temp 98 F (36.7 C) (Oral)   Resp 18   Ht _0  (1.549 m)   Wt 104.4 kg   SpO2 98%   BMI 43.49 kg/m  SpO2: SpO2: 98 % O2 Device: O2 Device: Room Air O2 Flow Rate:    Intake/output summary:  Intake/Output Summary (Last 24 hours) at 01/18/2021 1123 Last data filed at 01/18/2021 0600 Gross per 24 hour  Intake 1368.9 ml  Output 1350 ml  Net 18.9  ml    LBM: Last BM Date: 01/14/21 Baseline Weight: Weight: 80.7 kg Most recent weight: Weight: 104.4 kg       Palliative Assessment/Data:      Patient Active Problem List   Diagnosis Date Noted   Stage 3a chronic kidney disease ([place])    Severe protein-calorie malnutrition ([place])    Palliative care by specialist    SBO (small bowel obstruction) ([place]) 01/02/2021   Acute cystitis with hematuria 01/02/2021   Obstructive uropathy 01/02/2021   GERD (gastroesophageal reflux disease) 12/29/2020   Loose stools 09/22/2020   Generalized weakness 09/22/2020   Poor dentition 08/16/2020   Peripheral neuropathy due to chemotherapy ([place]) 07/14/2020   Pancytopenia, acquired ([place]) 07/08/2020   Hypomagnesemia 07/08/2020   Hypokalemia 07/08/2020   Weight loss 06/24/2020   Nausea with vomiting 06/07/2020   Metastasis to lymph nodes ([place]) 05/31/2020   Metastasis to lung ([place]) 05/31/2020   Goals of care, counseling/discussion 05/31/2020   Other constipation 05/20/2020   Deficiency anemia 05/12/2020   Cancer associated pain 05/12/2020   Diabetes mellitus ([place]) 05/12/2020   Endometrial carcinoma ([place]) 04/25/2020   Dysuria 04/20/2020   Postmenopausal bleeding 04/19/2020   Pap smear for cervical cancer screening 04/19/2020   Screening mammogram for breast cancer 04/19/2020   Elevated blood pressure reading 04/19/2020   Essential  hypertension 04/19/2020   Right renal mass 02/26/2020   Kidney stone 02/26/2020   Hematuria 02/26/2020    Palliative Care Assessment & Plan   Patient Profile: 65 year old lady with endometrial cancer diabetes hypertension recent COVID-19 infection in July, admitted with vomiting abdominal pain.  CT scan showing ileus possible evolving bowel obstruction, progression of ascites and omental caking with worsening bilateral hydronephrosis with obstructive uropathy.  Admitted to hospital medicine service, being followed by medical oncology and started on carboplatin Taxol and Herceptin.  Remains on NG tube and TPN.  Palliative services following for ongoing goals of care discussions.  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  Hospital course has been complicated by ongoing cancer associated pain, ongoing ileus secondary to abdominal carcinomatosis causing nausea and vomiting.  Goals of care have shifted towards full scope of comfort measures, adequate pain and on pain symptom management, discontinuation of artificial nutrition via TPN, preparation for transfer to residential hospice. I met with the patient's daughter at the bedside.  Reviewed about scope of end-of-life care as well as give her some information about the type of care that can be provided inside a residential hospice setting.  Some additional family members might come in to visit today.  Will liberalize visitor status, will request chaplain for additional support, will discuss with TOC regarding initiating  hospice referral.  Prognosis appears to be few days to 1 week or so at this point.   Palliative performance scale 20%. Goals of Care and Additional Recommendations: Limitations on Scope of Treatment: Comfort measures  Code Status: Now DO NOT RESUSCITATE    Code Status Orders  (From admission, onward)           Start     Ordered   01/02/21 1225  Full code  Continuous        01/02/21 1228  Code Status History     This patient has a current code status but no historical code status.       Prognosis:  Unable to determine  Discharge Planning: To Be Determined  Care plan was discussed with  patient daughter, bedside RN as well as Dr. Alvy Bimler and Dr. Cruzita Lederer  Thank you for allowing the Palliative Medicine Team to assist in the care of this patient.   Time In: 10 Time Out: 10.35 Total Time 35 Prolonged Time Billed No    Greater than 50%  of this time was spent counseling and coordinating care related to the above assessment and plan.   Loistine Chance, MD  Please contact Palliative Medicine Team phone at [phone] for questions and concerns.

## 2021-01-18 NOTE — Progress Notes (Addendum)
Manufacturing engineer [facility]) Hospital Liaison note.     This patient is approved to transfer to [facility], 01/19/21.  Please leave her IV access in place.   [facility] will notify TOC when registration paperwork has been completed to arrange transport.    RN please call report to [phone].   Thank you,     Farrel Gordon, RN, [facility] Liaison  [phone]

## 2021-01-18 NOTE — Plan of Care (Signed)
  Problem: Nutrition: Goal: Adequate nutrition will be maintained Outcome: Progressing   Problem: Pain Managment: Goal: General experience of comfort will improve Outcome: Progressing   Problem: Safety: Goal: Ability to remain free from injury will improve Outcome: Progressing   

## 2021-01-18 NOTE — Progress Notes (Signed)
PHARMACY - TOTAL PARENTERAL NUTRITION CONSULT NOTE   Indication: Prolonged ileus  Patient Measurements: Height: '5\' 1"'$  (154.9 cm) Weight: 104.4 kg (230 lb 2.6 oz) IBW/kg (Calculated) : 47.8   Body mass index is 43.49 kg/m. Usual Weight:   Assessment: Patient is a 65 y.o F with endometrial cancer presented to the ED on 01/02/21 with c/o abdominal pain and n/v.  Abd CT on 8/29 showed findings consistent with ileus and disease progression. Pharmacy consulted to manage TPN.  Patient received chemotherapy on 9/2 with Carbo/Taxol/Herceptin. Now neutropenic s/p chemo.  Glucose / Insulin: No hx DM.  - cbgs (goal <180): range 161-252 on rSSI q4h. BG remains elevated, but better controlled than previous 24 hrs - 30 units of insulin given in past 24 hours  BG was previously stable within goal range, but pt developed hyperglycemia on 9/13. No change to dextrose concentration of TPN or medications that would likely cause new hyperglycemia  Electrolytes: Mg (2.5) slightly elevated; All other lytes WNL including CorrCa (10) after increasing Na concentration and removing K - goal K >/=4, mag >/=2 for ileus Renal: SCr up to 1.54, BUN (82) continues to trend up. Worsening renal function Hepatic: LFT WNL (9/12), TBili WNL (9/12). Triglycerides WNL (9/12)  Intake / Output; MIVF: Strict I/O not measured - no MIVF - NG output: 1950 mL + unmeasured occurrences; UOP 300 mL has decreased; unmeasured stool x1 GI Imaging: - 8/29 abd CT: Interval progression of ascites with progression of omental soft tissue nodularity now demonstrating areas of confluent caking. Small bowel is mildly distended and fluid-filled up to 3 cm diameter and diffusely fluid-filled. Imaging features are compatible with ileus. Interval worsening of mild right and moderate left hydronephrosis with decreased perfusion to the left kidney compatible with obstructive uropathy GI Surgeries / Procedures:  - has NG tube  Central access:  port-a-cath TPN start date: 01/06/21  Nutritional Goals: Total fluid and TPN goal rate were reduced on 9/8 to target 80% of protein/calorie goals due to pt being fluid overloaded/edematous. Discussed with Dr. Alvy Bimler and Dr. Karleen Hampshire.  RD Assessment (01/06/21) Kcal:  2285-2500 kcal Protein:  95-110 gm (RD modified 9/8) Fluid:   2 L/day  80% goals = 1828- 2000 kcal, 76-88 g protein, 1.6 L fluid/day  New 80% protein/calorie goal rate TPN of 60 mL/hr provides 92 g protein and 1829 kcal  Current Nutrition:  TPN, Dysphagia III diet -Discussed on rounds: pt not currently taking anything by mouth   Plan:  Patient will be transitioning to comfort measures. TPN and all associated lab monitoring, nursing orders, and insulin orders discontinued.   Pharmacy to sign off.   Darylene Price [facility] PharmD 01/18/2021 7:56 AM

## 2021-01-19 DIAGNOSIS — Z515 Encounter for palliative care: Secondary | ICD-10-CM

## 2021-01-19 DIAGNOSIS — R140 Abdominal distension (gaseous): Secondary | ICD-10-CM

## 2021-01-19 DIAGNOSIS — C541 Malignant neoplasm of endometrium: Secondary | ICD-10-CM | POA: Diagnosis not present

## 2021-01-19 LAB — RESP PANEL BY RT-PCR (FLU A&B, COVID) ARPGX2
Influenza A by PCR: NEGATIVE
Influenza B by PCR: NEGATIVE
SARS Coronavirus 2 by RT PCR: NEGATIVE

## 2021-01-19 MED ORDER — MORPHINE 100MG IN NS 100ML (1MG/ML) PREMIX INFUSION: 1.0000 mg/h | INTRAVENOUS | 0 refills | Status: AC

## 2021-01-19 MED ORDER — CLONIDINE 0.1 MG/24HR TD PTWK: 0.1000 mg | MEDICATED_PATCH | TRANSDERMAL | 12 refills | Status: AC

## 2021-01-19 NOTE — Discharge Summary (Signed)
Physician Discharge Summary  Amanda Lin [id number] DOB: [birth year] DOA: 01/02/2021  PCP: Nicolette Bang, MD  Admit date: 01/02/2021 Discharge date: 01/19/2021  Admitted From: home Disposition:  residential hospice  Recommendations for Outpatient Follow-up:  Follow up with hospice  Home Health: none Equipment/Devices: none  Discharge Condition: stable CODE STATUS: DNR Diet recommendation: NPO  HPI: Per admitting MD, Amanda Lin is a 65 y.o. female with medical history significant for endometrial cancer (diagnosed January 2022), diabetes, hypertension, COVID infection early July 2022, who presents to the emergency department on 01/02/2021 with abdominal pain and vomiting. Onset was 3 weeks ago after getting over a Covid infection. She developed constipation at that time. Had lactulose, miralax, but did not help. Pain is located in the periumbilical and episgastric areas and radiates to the rest of the abdomen. It is up to 6/10 at times and is characterized as sharp. She is unable to keep anything down, even water. She is having numerous episodes of vomiting sometimes several times an hour. Vomit is sometimes yellow/green and sometimes dark brown; it smells like rotten fish. Symptoms are alleviated by nothing and exacerbated by eating. Associated symptoms: Constipation; last BM was 12/31/20 and was actually diarrhea. No bloody stool. Chills but no fever. No shortness of breath, cough, wheezing, chest pain, palpitations. She had mild burning with urination that began 3 days prior to admission; no hematuria. Left sided hip and side pain started several days before admission.  Has had weight loss from 189 lb to 178 lb over a week.  Treatments: Patient followed up with oncology and was given IV fluids as an outpatient a few days before admission.  She is given antiemetics without relief.  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  After discussion about goals of care will transition to comfort measures only. 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 Residential hospice planning  Sepsis ruled out   Discharge Instructions  Discharge Instructions     TREATMENT CONDITIONS   Complete by: As directed    Patient should have CBC & CMP within 7 days prior to chemotherapy administration. NOTIFY MD IF: ANC < 1500, Hemoglobin < 8, PLT < 100,000,  Total Bili > 1.5, Creatinine > 1.5, ALT & AST > 80 or if patient has unstable vital signs: Temperature > 38.5, SBP > 180 or < 90, RR > 30 or HR > 100.      Allergies as of 01/19/2021   No Known Allergies      Medication List     STOP taking these medications    amLODipine 10 MG tablet Commonly known as: NORVASC   atenolol 50 MG tablet Commonly known as: Tenormin   losartan 100 MG tablet Commonly known as: COZAAR   magnesium oxide 400 (240 Mg) MG tablet Commonly known as: MAG-OX       TAKE these medications    chlorhexidine 0.12 % solution Commonly known as: PERIDEX 15 mLs 2 (two) times daily.   cloNIDine 0.1 mg/24hr patch Commonly known as: CATAPRES - Dosed in mg/24 hr Place 1  patch (0.1 mg total) onto the skin once a week. Start taking on: January 21, 2021   morphine 1 mg/mL Soln infusion Inject 1 mg/hr into the vein continuous.   Oxycodone HCl 10 MG Tabs Take  1 tablet (10 mg total) by mouth every 4 (four) hours as needed for severe pain.   polyethylene glycol 17 g packet Commonly known as: MIRALAX / GLYCOLAX Take 17 g by mouth daily as needed for mild constipation.   sennosides-docusate sodium 8.6-50 MG tablet Commonly known as: SENOKOT-S Take 1 tablet by mouth daily as needed for constipation.         Consultations: Palliative care Oncology   Procedures/Studies:  CT ABDOMEN PELVIS WO CONTRAST  Result Date: 01/17/2021 CLINICAL DATA:  Abdominal distension. Bowel obstruction suspected. Nausea and vomiting. history of endometrial cancer, diabetes, hypertension and COVID-19 infection. EXAM: CT ABDOMEN AND PELVIS WITHOUT CONTRAST TECHNIQUE: Multidetector CT imaging of the abdomen and pelvis was performed following the standard protocol without IV contrast. COMPARISON:  CT chest abdomen pelvis 01/02/2021 FINDINGS: Lines and tubes: Port-A-Cath with tip at the superior cavoatrial junction. Enteric tube with side port at the gastroesophageal junction and tip within the gastric lumen. Lower chest: Bilateral trace pleural effusions with bibasilar atelectasis. Hepatobiliary: The liver is enlarged measuring up to 20 cm. No focal liver abnormality. Status post cholecystectomy. No biliary dilatation. Pancreas: No focal lesion. Normal pancreatic contour. No surrounding inflammatory changes. No main pancreatic ductal dilatation. Spleen: Normal in size without focal abnormality. Adrenals/Urinary Tract: No adrenal nodule bilaterally. The left kidney renal parenchyma is slightly hyperdense compared to the right in may be due to previously administered intravenous contrast that is not being properly excreted. Similar-appearing bilateral, left greater than right, moderate hydroureteronephrosis. There is a 1.4 cm hyperdense lesion along the right kidney with a density of 65 Hounsfield units. Couple of too small to characterize hyperdense subcentimeter densities within  the left kidney. Otherwise no definite contour-deforming renal mass. No ureterolithiasis or hydroureter. The urinary bladder is unremarkable. Stomach/Bowel: Stomach is within normal limits. Similar-appearing small bowel distended with fluid measuring up to 3 cm. No definite small bowel dilatation. No definite pneumatosis. No evidence of bowel wall thickening or dilatation. The majority of the large bowel is collapsed. The appendix is normal in caliber. Vascular/Lymphatic: No abdominal aorta or iliac aneurysm. Mild atherosclerotic plaque of the aorta and its branches. No abdominal, pelvic, or inguinal lymphadenopathy. Reproductive: Poorly visualized uterus in a patient with known endometrial cancer. Calcified lesion within the uterine fundus wall likely represents a degenerative fibroid. Bilateral adnexa are unremarkable. Other: Some increased in volume of small to moderate free fluid ascites. No intraperitoneal free gas. No organized fluid collection. There is a nodular like soft tissue density lesion measuring up to 1.4 cm in short axis within the right pelvis/adnexal regions that may represent a peritoneal nodule (2:69). Known peritoneal carcinomatosis again noted the poorly visualized on this noncontrast study. Musculoskeletal: No abdominal wall hernia or abnormality. No suspicious lytic or blastic osseous lesions. No acute displaced fracture. Multilevel degenerative changes of the spine. IMPRESSION: 1. Markedly limited evaluation on this noncontrast study in a patient with known endometrial cancer and peritoneal carcinomatosis. 2. Persistent small bowel distension up to 3 cm with no definite bowel obstruction or pneumatosis. Bowel ischemia cannot be evaluated for on this noncontrast study. 3. Similar-appearing bilateral, left greater than right, moderate hydroureteronephrosis. Associated hyperdense left renal parenchyma compared to the right. This reflects chronic changes of obstructive uropathy. 4. Slight  interval increase  in volume of small to moderate ascites. 5. Hepatomegaly. 6. Degenerative uterine fibroid. 7. Indeterminate 1.4 cm right renal lesion. Subcentimeter hyperdense lesions within the left kidney are too small to characterize. Consider nonemergent outpatient MRI renal protocol for further evaluation. When the patient is clinically stable and able to follow directions and hold their breath (preferably as an outpatient) further evaluation with dedicated abdominal MRI should be considered. 8.  Aortic Atherosclerosis (ICD10-I70.0). Electronically Signed   By: Iven Finn M.D.   On: 01/17/2021 16:53   DG Chest 2 View  Result Date: 01/17/2021 CLINICAL DATA:  Complains of weakness, abdominal pain, finger tip pain. Shortness of breath. History of endometrial carcinoma. EXAM: CHEST - 2 VIEW COMPARISON:  CT chest 11/02/2020, CT abdomen pelvis 01/17/2021 FINDINGS: Access right chest wall Port-A-Cath with tip terminating in the region of the superior cavoatrial junction. Enteric tube coursing below the hemidiaphragm with tip overlying the expected region of the gastric lumen and side port at the gastroesophageal junction. The heart and mediastinal contours are within normal limits. Bibasilar atelectasis. No focal consolidation. No pulmonary edema. Bilateral trace pleural effusions. No pneumothorax. No acute osseous abnormality. . IMPRESSION: 1. Bilateral trace pleural effusions with bibasilar atelectasis. 2. Enteric tube with tip coursing below the hemidiaphragm overlying the gastric lumen and side port at the gastroesophageal junction. Consider advancing by 3cm. Electronically Signed   By: Iven Finn M.D.   On: 01/17/2021 16:32   DG Abd 1 View  Result Date: 01/16/2021 CLINICAL DATA:  NG tube placement EXAM: ABDOMEN - 1 VIEW COMPARISON:  01/12/2021 FINDINGS: Enteric tube terminates in the distal gastric body. Dilated loops of small bowel in the left mid abdomen. Surgical clips in the right upper  abdomen. IMPRESSION: Enteric tube terminates in the distal gastric body. Electronically Signed   By: Julian Hy M.D.   On: 01/16/2021 01:51   DG Abd 1 View  Result Date: 01/03/2021 CLINICAL DATA:  Ileus EXAM: ABDOMEN - 1 VIEW COMPARISON:  Portable exam 0848 hours compared to 01/02/2021 FINDINGS: Tip of nasogastric tube projects over stomach. Excreted contrast material within urinary bladder with faint contrast bilaterally in dilated renal collecting systems. Single nonspecific air-filled nondistended small bowel loops in mid abdomen. Bowel gas pattern otherwise normal. Osseous structures unremarkable. Surgical clips RIGHT upper quadrant likely reflect prior cholecystectomy. IMPRESSION: BILATERAL hydronephrosis. Normal bowel gas pattern. Electronically Signed   By: Lavonia Dana M.D.   On: 01/03/2021 10:44   CT ABDOMEN PELVIS W CONTRAST  Result Date: 01/02/2021 CLINICAL DATA:  Endometrial cancer.  Bowel obstruction suspected. EXAM: CT ABDOMEN AND PELVIS WITH CONTRAST TECHNIQUE: Multidetector CT imaging of the abdomen and pelvis was performed using the standard protocol following bolus administration of intravenous contrast. CONTRAST:  52m OMNIPAQUE IOHEXOL 350 MG/ML SOLN COMPARISON:  11/02/2020 FINDINGS: Lower chest: Basilar atelectasis. Hepatobiliary: No suspicious focal abnormality within the liver parenchyma. Gallbladder is surgically absent. No intrahepatic or extrahepatic biliary dilation. Pancreas: No focal mass lesion. No dilatation of the main duct. No intraparenchymal cyst. No peripancreatic edema. Spleen: No splenomegaly. No focal mass lesion. Adrenals/Urinary Tract: No adrenal nodule or mass. Mild right and moderate left hydronephrosis evident with decreased perfusion to the left kidney. 1.3 cm lesion lower pole right kidney, previously characterized as hemorrhagic cyst. More subtle enhancing lesion identified anterior lower pole right kidney, previously characterized as suspicious for  neoplasm on CT without and with contrast dated 03/28/2020. Both ureters are dilated down to the level of the pelvic sidewall. Bladder is unremarkable. Stomach/Bowel: Stomach  is unremarkable. No gastric wall thickening. No evidence of outlet obstruction. Duodenum is normally positioned as is the ligament of Treitz. No small bowel wall thickening. Small bowel is mildly distended up to 3 cm diameter and diffusely fluid-filled. The terminal ileum is normal. The appendix is normal. No gross colonic mass. No colonic wall thickening. Vascular/Lymphatic: There is mild atherosclerotic calcification of the abdominal aorta without aneurysm. Small lymph nodes are seen in the gastrohepatic and hepatoduodenal ligament. Small lymph nodes evident in the retroperitoneal space of the abdomen along the pelvic sidewall bilaterally. Reproductive: Calcified fibroid noted in the uterus. Endometrium not well visualized. There is no adnexal mass. Other: Interval progression ascites with progression of omental soft tissue nodularity now demonstrating areas of confluent "caking" (see midline image 46/2). Enhancing peritoneal nodules are seen adjacent to the inferior liver (35/2) and in the right pelvis (63/2). There are enhancing mesenteric nodules (see central mesentery on 52/2) with soft tissue nodularity in the region of the umbilicus measuring 2.3 x 2.2 cm on image 54/2. Moderate volume ascites seen around the liver and spleen and along the stomach with moderate volume ascites in the pelvis is well. Musculoskeletal: No worrisome lytic or sclerotic osseous abnormality. IMPRESSION: 1. Interval progression of ascites with progression of omental soft tissue nodularity now demonstrating areas of confluent caking. Peritoneal nodularity is also progressive in the interval. 2. Small bowel is mildly distended and fluid-filled up to 3 cm diameter and diffusely fluid-filled. Imaging features are compatible with ileus. Evolving obstruction not  entirely excluded. 3. Interval worsening of mild right and moderate left hydronephrosis with decreased perfusion to the left kidney compatible with obstructive uropathy. Both ureters are obstructed at the level of the pelvic sidewall. 4. Subtle enhancing lesion anterior lower pole right kidney, previously characterized as neoplasm on CT without and with contrast dated 03/28/2020. 5. Small lymph nodes in the gastrohepatic and hepatoduodenal ligament, in the retroperitoneal space of the abdomen, and along the pelvic sidewall bilaterally. 6. Calcified fibroid in the uterus. Endometrium not well visualized. 7. Aortic Atherosclerosis (ICD10-I70.0). Electronically Signed   By: Misty Stanley M.D.   On: 01/02/2021 11:56   DG Knee Left Port  Result Date: 01/15/2021 CLINICAL DATA:  Left knee pain, limited range of motion EXAM: PORTABLE LEFT KNEE - 1-2 VIEW COMPARISON:  None. FINDINGS: Frontal and lateral views of the left knee are obtained. Lateral view is suboptimal due to patient cooperation and difficulty positioning. No fracture, subluxation, or dislocation. There is 3 compartmental osteoarthritis greatest in the medial compartment, with severe joint space narrowing, eburnation, and marginal osteophyte formation. There is diffuse subcutaneous edema. No joint effusion. IMPRESSION: 1. Severe 3 compartmental osteoarthritis greatest medially. 2. No evidence of acute fracture. 3. Diffuse subcutaneous edema. Electronically Signed   By: Randa Ngo M.D.   On: 01/15/2021 18:08   DG Abd 2 Views  Result Date: 01/12/2021 CLINICAL DATA:  Follow-up bowel obstruction EXAM: ABDOMEN - 2 VIEW COMPARISON:  KUB dated 1 day prior FINDINGS: Enteric catheter is in place with the tip and side hole projecting over the stomach. A right chest wall port is in place with the tip in the lower SVC. There is a nonobstructive bowel gas pattern. Right upper quadrant and pelvic surgical clips are noted. There is a trace left pleural effusion.  There is no acute osseous abnormality. IMPRESSION: 1. Enteric catheter tip and side hole in the stomach. 2. Nonobstructive bowel gas pattern. 3. Trace left pleural effusion Electronically Signed   By: Collier Salina  Noone M.D.   On: 01/12/2021 10:06   DG Abd 2 Views  Result Date: 01/05/2021 CLINICAL DATA:  Abdomen pain and emesis EXAM: ABDOMEN - 2 VIEW COMPARISON:  01/03/2021, CT 01/02/2021 FINDINGS: Trace pleural effusion left lung base. No free air beneath the diaphragm. Increased small bowel distension, measuring up to 4.1 cm with multiple fluid levels. Paucity of distal gas. IMPRESSION: 1. Increased small bowel distension with multiple fluid levels and paucity of distal gas, suspect for bowel obstruction Electronically Signed   By: Donavan Foil M.D.   On: 01/05/2021 16:34   DG Abd 2 Views  Result Date: 12/22/2020 CLINICAL DATA:  Right lower quadrant abdominal pain with nausea, vomiting, and constipation. History of endometrial cancer. EXAM: ABDOMEN - 2 VIEW COMPARISON:  None. FINDINGS: The bowel gas pattern is normal. There is no evidence of free air. No radio-opaque calculi or other significant radiographic abnormality is seen. Surgical clips in the right upper quadrant and pelvis. No acute osseous abnormality. IMPRESSION: Negative. Electronically Signed   By: Titus Dubin M.D.   On: 12/22/2020 12:03   DG Abd Portable 1V  Result Date: 01/11/2021 CLINICAL DATA:  Bowel obstruction. EXAM: PORTABLE ABDOMEN - 1 VIEW COMPARISON:  01/08/2021 and CT abdomen pelvis 01/02/2021. FINDINGS: Image quality is degraded by body habitus. Nasogastric tube terminates in the stomach. Gas is seen in nondilated small bowel and some colon. Surgical clips in the right upper quadrant. IMPRESSION: No evidence of small-bowel obstruction. Electronically Signed   By: Lorin Picket M.D.   On: 01/11/2021 08:26   DG Abd Portable 1V  Result Date: 01/08/2021 CLINICAL DATA:  Nasogastric tube adjustment EXAM: PORTABLE ABDOMEN - 1 VIEW  COMPARISON:  01/05/2021 FINDINGS: Nasogastric tube placement to the decompressed stomach. A few gas-filled nondilated small bowel loops in the mid abdomen. Colon decompressed. Cholecystectomy clips. IMPRESSION: Nasogastric tube to decompressed stomach. Electronically Signed   By: Lucrezia Europe M.D.   On: 01/08/2021 15:30   DG Abd Portable 1 View  Result Date: 01/02/2021 CLINICAL DATA:  Check gastric catheter placement EXAM: PORTABLE ABDOMEN - 1 VIEW COMPARISON:  None. FINDINGS: Gastric catheter is noted extending into the stomach. The proximal side port lies just beyond the gastroesophageal junction and could be advanced a few cm as necessary. Visualized lungs are hypoinflated but clear. Right chest wall port is noted. IMPRESSION: Gastric catheter in the stomach as described. Electronically Signed   By: Inez Catalina M.D.   On: 01/02/2021 14:01   [place] EKG SITE RITE  Result Date: 01/08/2021 If Site Rite image not attached, placement could not be confirmed due to current cardiac rhythm.    Subjective: - no chest pain, shortness of breath, no abdominal pain, nausea or vomiting.   Discharge Exam: BP (!) 142/78 (BP Location: Left Arm)   Pulse (!) 124   Temp 98 F (36.7 C) (Oral)   Resp 18   Ht '5\' 1"'$  (1.549 m)   Wt 104.4 kg   SpO2 100%   BMI 43.49 kg/m   General: Pt is alert, awake, not in acute distress Cardiovascular: RRR, S1/S2 +, no rubs, no gallops Respiratory: CTA bilaterally, no wheezing, no rhonchi Abdominal: Soft, NT, ND, bowel sounds + Extremities: no edema, no cyanosis    The results of significant diagnostics from this hospitalization (including imaging, microbiology, ancillary and laboratory) are listed below for reference.     Microbiology: No results found for this or any previous visit (from the past 240 hour(s)).   Labs: Basic Metabolic Panel:  Recent Labs  Lab 01/14/21 0500 01/15/21 0506 01/16/21 0525 01/17/21 0421 01/18/21 0458  NA 137 139 138 134* 139  K 4.4  4.3 4.0 4.5 4.0  CL 107 110 106 103 105  CO2 '23 25 24 23 25  '$ GLUCOSE 170* 149* 174* 293* 186*  BUN 45* 44* 46* 63* 82*  CREATININE 0.75 0.81 0.92 1.15* 1.54*  CALCIUM 9.2 9.0 8.8* 8.5* 8.4*  MG 2.0 1.9 2.2 2.3 2.5*  PHOS 3.4 3.4 3.5 3.4 3.7   Liver Function Tests: Recent Labs  Lab 01/16/21 0525  AST 16  ALT 18  ALKPHOS 114  BILITOT 0.4  PROT 5.2*  ALBUMIN 2.0*   CBC: Recent Labs  Lab 01/12/21 1756 01/17/21 0421  WBC 4.0 0.9*  NEUTROABS  --  0.2*  HGB 8.1* 11.2*  HCT 25.5* 34.6*  MCV 90.7 88.5  PLT 123* 182   CBG: Recent Labs  Lab 01/17/21 1847 01/17/21 2048 01/18/21 0016 01/18/21 0443 01/18/21 0736  GLUCAP 252* 229* 193* 179* 161*   Hgb A1c No results for input(s): HGBA1C in the last 72 hours. Lipid Profile No results for input(s): CHOL, HDL, LDLCALC, TRIG, CHOLHDL, LDLDIRECT in the last 72 hours. Thyroid function studies No results for input(s): TSH, T4TOTAL, T3FREE, THYROIDAB in the last 72 hours.  Invalid input(s): FREET3 Urinalysis    Component Value Date/Time   COLORURINE YELLOW 01/02/2021 0931   APPEARANCEUR CLOUDY (A) 01/02/2021 0931   LABSPEC 1.015 01/02/2021 0931   PHURINE 5.0 01/02/2021 0931   GLUCOSEU NEGATIVE 01/02/2021 0931   HGBUR MODERATE (A) 01/02/2021 0931   BILIRUBINUR NEGATIVE 01/02/2021 0931   BILIRUBINUR moderate (A) 02/23/2020 0946   KETONESUR 5 (A) 01/02/2021 0931   PROTEINUR 100 (A) 01/02/2021 0931   UROBILINOGEN 0.2 02/23/2020 0946   NITRITE NEGATIVE 01/02/2021 0931   LEUKOCYTESUR LARGE (A) 01/02/2021 0931    FURTHER DISCHARGE INSTRUCTIONS:   Get Medicines reviewed and adjusted: Please take all your medications with you for your next visit with your Primary MD   Laboratory/radiological data: Please request your Primary MD to go over all hospital tests and procedure/radiological results at the follow up, please ask your Primary MD to get all Hospital records sent to his/her office.   In some cases, they will be blood  work, cultures and biopsy results pending at the time of your discharge. Please request that your primary care M.D. goes through all the records of your hospital data and follows up on these results.   Also Note the following: If you experience worsening of your admission symptoms, develop shortness of breath, life threatening emergency, suicidal or homicidal thoughts you must seek medical attention immediately by calling 911 or calling your MD immediately  if symptoms less severe.   You must read complete instructions/literature along with all the possible adverse reactions/side effects for all the Medicines you take and that have been prescribed to you. Take any new Medicines after you have completely understood and accpet all the possible adverse reactions/side effects.    Do not drive when taking Pain medications or sleeping medications (Benzodaizepines)   Do not take more than prescribed Pain, Sleep and Anxiety Medications. It is not advisable to combine anxiety,sleep and pain medications without talking with your primary care practitioner   Special Instructions: If you have smoked or chewed Tobacco  in the last 2 yrs please stop smoking, stop any regular Alcohol  and or any Recreational drug use.   Wear Seat belts while driving.   Please note:  You were cared for by a hospitalist during your hospital stay. Once you are discharged, your primary care physician will handle any further medical issues. Please note that NO REFILLS for any discharge medications will be authorized once you are discharged, as it is imperative that you return to your primary care physician (or establish a relationship with a primary care physician if you do not have one) for your post hospital discharge needs so that they can reassess your need for medications and monitor your lab values.  Time coordinating discharge: 40 minutes  SIGNED:  Marzetta Board, MD, PhD 01/19/2021, 9:13 AM

## 2021-01-19 NOTE — TOC Transition Note (Signed)
Transition of Care [facility]) - CM/SW Discharge Note   Patient Details  Name: Amanda Lin MRN: [id number] Date of Birth: 11/20/1955  [facility] CM/SW Contact:  Lynnell Catalan, RN Phone Number: 01/19/2021, 10:59 AM   Clinical Narrative:     Pt to dc to [facility] today. [facility] to transport pt. RN to call report to [phone]. Yellow DNR on the chart for transport.        Readmission Risk Interventions No flowsheet data found.

## 2021-01-19 NOTE — Progress Notes (Signed)
Engineer, maintenance [facility]) Hospital Liaison note.   Received request from [place] for family interest in [facility] with request for transfer today. Chart reviewed and eligibility confirmed.   Met with daughter and patient and family to confirm interest and explain services. Family agreeable to transfer today. CSW aware.    Registration paperwork completed. Dr. Orpah Melter to assume care per family request.    RN please call report to [phone]. Please arrange transport for patient.   Please leave chest port accessible. Covid screen, clamp NG, keep foley in place, and please provide bolus for pain prior to discharge.   Thank you,   Clementeen Hoof, BSN, RN [place] (listed on AMION under Hospice and [place] [facility] [phone]   [phone]

## 2021-01-19 NOTE — Progress Notes (Signed)
Daily Progress Note   Patient Name: Amanda Lin       Date: 01/19/2021 DOB: 10/20/1955  Age: 65 y.o. MRN#: [id number] Attending Physician: Caren Griffins, MD Primary Care Physician: Nicolette Bang, MD Admit Date: 01/02/2021  Reason for Consultation/Follow-up: Establishing goals of care: Now on comfort measures.  Subjective: Patient is sitting up in bed, she is awake, she asks me who I am. Daughter at bedside, patient has a bed at local hospice house today.     Length of Stay: 17  Current Medications: Scheduled Meds:   Chlorhexidine Gluconate Cloth  6 each Topical Daily   cloNIDine  0.1 mg Transdermal Weekly   LORazepam  1 mg Intravenous TID   pantoprazole (PROTONIX) IV  40 mg Intravenous Q24H   prochlorperazine  10 mg Intravenous Q6H   sodium chloride flush  10-40 mL Intracatheter Q12H    Continuous Infusions:  morphine      PRN Meds: acetaminophen **OR** acetaminophen, acetaminophen **OR** acetaminophen, alum & mag hydroxide-simeth, antiseptic oral rinse, bisacodyl, diclofenac Sodium, glycopyrrolate **OR** glycopyrrolate **OR** glycopyrrolate, haloperidol **OR** haloperidol **OR** haloperidol lactate, LORazepam **OR** LORazepam **OR** LORazepam, [DISCONTINUED] ondansetron **OR** ondansetron (ZOFRAN) IV, ondansetron **OR** ondansetron (ZOFRAN) IV, phenol, polyvinyl alcohol, sodium chloride flush  Physical Exam         Patient is sitting up in bed Has NG tube Has mild to moderate generalized distress Has extensive edema upper and lower extremities Appears chronically ill Awake today Abdomen is mildly distended   Vital Signs: BP (!) 142/78 (BP Location: Left Arm)   Pulse (!) 124   Temp 98 F (36.7 C) (Oral)   Resp 18   Ht 5' 1"  (1.549 m)   Wt 104.4 kg    SpO2 100%   BMI 43.49 kg/m  SpO2: SpO2: 100 % O2 Device: O2 Device: Room Air O2 Flow Rate:    Intake/output summary:  Intake/Output Summary (Last 24 hours) at 01/19/2021 1000 Last data filed at 01/19/2021 0547 Gross per 24 hour  Intake --  Output 550 ml  Net -550 ml    LBM: Last BM Date: 01/14/21 Baseline Weight: Weight: 80.7 kg Most recent weight: Weight: 104.4 kg       Palliative Assessment/Data:      Patient Active Problem List   Diagnosis Date Noted   Stage 3a  chronic kidney disease (HCC)    Severe protein-calorie malnutrition ([place])    Palliative care by specialist    SBO (small bowel obstruction) ([place]) 01/02/2021   Acute cystitis with hematuria 01/02/2021   Obstructive uropathy 01/02/2021   GERD (gastroesophageal reflux disease) 12/29/2020   Loose stools 09/22/2020   Generalized weakness 09/22/2020   Poor dentition 08/16/2020   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   Pancytopenia, acquired ([place]) 07/08/2020   Hypomagnesemia 07/08/2020   Hypokalemia 07/08/2020   Weight loss 06/24/2020   Nausea with vomiting 06/07/2020   Metastasis to lymph nodes ([place]) 05/31/2020   Metastasis to lung ([place]) 05/31/2020   Goals of care, counseling/discussion 05/31/2020   Other constipation 05/20/2020   Deficiency anemia 05/12/2020   Cancer associated pain 05/12/2020   Diabetes mellitus ([place]) 05/12/2020   Endometrial carcinoma ([place]) 04/25/2020   Dysuria 04/20/2020   Postmenopausal bleeding 04/19/2020   Pap smear for cervical cancer screening 04/19/2020   Screening mammogram for breast cancer 04/19/2020   Elevated blood pressure reading 04/19/2020   Essential hypertension 04/19/2020   Right renal mass 02/26/2020   Kidney stone 02/26/2020   Hematuria 02/26/2020    Palliative Care Assessment & Plan   Patient Profile: 65 year old lady with endometrial cancer diabetes hypertension recent COVID-19 infection in July, admitted with vomiting abdominal pain.   CT scan showing ileus possible evolving bowel obstruction, progression of ascites and omental caking with worsening bilateral hydronephrosis with obstructive uropathy.  Admitted to hospital medicine service, being followed by medical oncology and started on carboplatin Taxol and Herceptin.  Remains on NG tube and TPN.  Palliative services following for ongoing goals of care discussions.  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  Hospital course has been complicated by ongoing cancer associated pain, ongoing ileus secondary to abdominal carcinomatosis causing nausea and vomiting.  Goals of care have shifted towards full scope of comfort measures, adequate pain and on pain symptom management, discontinuation of artificial nutrition via TPN, preparation for transfer to residential hospice. I met with the patient's daughter at the bedside.  Reviewed about scope of end-of-life care as well as give her some information about the type of care that can be provided inside a residential hospice setting.  Some additional family members might come in to visit today.  Will liberalize visitor status, will request chaplain for additional support, will discuss with TOC regarding initiating hospice referral.  Prognosis appears to be few days to 1 week or so at this point. 9-15: patient has a bed at residential hospice. Discussed with daughter and bedside RN about using PRN bolus of opioid right before her scheduled transfer.    Palliative performance scale 20%. Goals of Care and  Additional Recommendations: Limitations on Scope of Treatment: Comfort measures  Code Status: Now DO NOT RESUSCITATE    Code Status Orders  (From admission, onward)           Start     Ordered   01/02/21 1225  Full code  Continuous        01/02/21 1228           Code Status History     This patient has a current code  status but no historical code status.       Prognosis:  Few days  Discharge Planning: Residential hospice.   Care plan was discussed with  patient daughter, bedside RN as well as Dr. Cruzita Lederer  Thank you for allowing the Palliative Medicine Team to assist in the care of this patient.   Time In: 9 Time Out: 9.15 Total Time 15 Prolonged Time Billed No    Greater than 50%  of this time was spent counseling and coordinating care related to the above assessment and plan.   Loistine Chance, MD  Please contact Palliative Medicine Team phone at [phone] for questions and concerns.

## 2021-01-26 ENCOUNTER — Emergency Department (HOSPITAL_COMMUNITY)
Admission: EM | Admit: 2021-01-26 | Discharge: 2021-01-26 | Disposition: A | Discharge status: Home / Self Care | Payer: Self-pay | Attending: Emergency Medicine | Admitting: Emergency Medicine

## 2021-01-26 ENCOUNTER — Emergency Department (HOSPITAL_COMMUNITY): Payer: 59

## 2021-01-26 ENCOUNTER — Other Ambulatory Visit: Payer: Self-pay

## 2021-01-26 DIAGNOSIS — Z79899 Other long term (current) drug therapy: Secondary | ICD-10-CM | POA: Insufficient documentation

## 2021-01-26 DIAGNOSIS — R000 Tachycardia, unspecified: Secondary | ICD-10-CM | POA: Diagnosis not present

## 2021-01-26 DIAGNOSIS — Z4659 Encounter for fitting and adjustment of other gastrointestinal appliance and device: Secondary | ICD-10-CM | POA: Diagnosis not present

## 2021-01-26 DIAGNOSIS — I129 Hypertensive chronic kidney disease with stage 1 through stage 4 chronic kidney disease, or unspecified chronic kidney disease: Secondary | ICD-10-CM | POA: Diagnosis not present

## 2021-01-26 DIAGNOSIS — E119 Type 2 diabetes mellitus without complications: Secondary | ICD-10-CM | POA: Insufficient documentation

## 2021-01-26 DIAGNOSIS — Z8542 Personal history of malignant neoplasm of other parts of uterus: Secondary | ICD-10-CM | POA: Diagnosis not present

## 2021-01-26 DIAGNOSIS — Z515 Encounter for palliative care: Secondary | ICD-10-CM | POA: Diagnosis not present

## 2021-01-26 DIAGNOSIS — N1831 Chronic kidney disease, stage 3a: Secondary | ICD-10-CM | POA: Diagnosis not present

## 2021-01-26 LAB — CBG MONITORING, ED: Glucose-Capillary: 112 mg/dL — ABNORMAL HIGH (ref 70–99)

## 2021-01-26 MED ORDER — ACETAMINOPHEN 650 MG RE SUPP
650.0000 mg | Freq: Once | RECTAL | Status: AC
  Administered 2021-01-26: 650 mg via RECTAL
  Filled 2021-01-26: qty 1

## 2021-01-26 MED ORDER — MORPHINE SULFATE (PF) 2 MG/ML IV SOLN
2.0000 mg | INTRAVENOUS | Status: DC | PRN
Start: 2021-01-26 — End: 2021-01-26
  Administered 2021-01-26: 2 mg via INTRAVENOUS
  Filled 2021-01-26: qty 1

## 2021-01-26 MED ORDER — SODIUM CHLORIDE 0.9 % IV BOLUS: 500.0000 mL | Freq: Once | INTRAVENOUS | Status: DC

## 2021-02-04 NOTE — ED Provider Notes (Signed)
[place] EMERGENCY DEPARTMENT Provider Note   CSN: [id number] Arrival date & time: 02-06-21  0715     History No chief complaint on file.   Amanda Lin is a 65 y.o. female presenting for NG tube replacement.  Level 5 caveat as patient is nonverbal and at end-of-life.  History obtained from [facility] staff. Per staff, pt's NG tube became dislodged yesterday. Was unable to be easily replaced. Staff tried IV medications including benadryl, ativan, phenergan, and morphine without improvement. Pt continued to have n/v and heaving throughout the night. Pt's daughter (who make her medical decisions/is primary care giver) requested NG tube be replaced.  Per staff, pt was not known to have a fever, has not receive tylenol today.   Additional history obtained from pt's daughter, Amanda Lin. She states pt was more comfortable with the NG tube, and in order to keep her comfortable requested NG tube replacement.   Additional history obtained from chart review. H/o endometrial carcinoma, transferred to hospice/comfort care only as of 09/15. NG was placed during last hospitalization due to n/v unable to be controlled with IV meds.    HPI     Past Medical History:  Diagnosis Date   Diabetes mellitus without complication ([place])    Endometrial cancer ([place])    Hypertension     Patient Active Problem List   Diagnosis Date Noted   Dying care    Stage 3a chronic kidney disease ([place])    Severe protein-calorie malnutrition ([place])    Palliative care by specialist    SBO (small bowel obstruction) ([place]) 01/02/2021   Acute cystitis with hematuria 01/02/2021   Obstructive uropathy 01/02/2021   GERD (gastroesophageal reflux disease) 12/29/2020   Loose stools 09/22/2020   Generalized weakness 09/22/2020   Poor dentition 08/16/2020   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   Pancytopenia, acquired ([place]) 07/08/2020   Hypomagnesemia 07/08/2020    Hypokalemia 07/08/2020   Weight loss 06/24/2020   Nausea with vomiting 06/07/2020   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/2020   Metastasis to lung ([place]) 05/31/2020   Goals of care, counseling/discussion 05/31/2020   Other constipation 05/20/2020   Deficiency anemia 05/12/2020   Cancer associated pain 05/12/2020   Diabetes mellitus ([place]) 05/12/2020   Endometrial carcinoma ([place]) 04/25/2020   Dysuria 04/20/2020   Postmenopausal bleeding 04/19/2020   Pap smear for cervical cancer screening 04/19/2020   Screening mammogram for breast cancer 04/19/2020   Elevated blood pressure reading 04/19/2020   Essential hypertension 04/19/2020   Right renal mass 02/26/2020   Kidney stone 02/26/2020   Hematuria 02/26/2020    Past Surgical History:  Procedure Laterality Date   IR IMAGING GUIDED PORT INSERTION  06/01/2020   TUBAL LIGATION Bilateral      OB History   No obstetric history on file.     Family History  Problem Relation Age of Onset   Cancer Mother        uterine diagnosed around 90   Prostate cancer Other     Social History   Tobacco Use   Smoking status: Never   Smokeless tobacco: Never  Vaping Use   Vaping Use: Never used  Substance Use Topics   Alcohol use: Yes    Comment: ocassionally   Drug use: No    Home Medications Prior to Admission medications   Medication Sig Start Date End Date Taking? Authorizing Provider  chlorhexidine (PERIDEX) 0.12 % solution 15 mLs 2 (two) times daily. 11/26/20  [provider]  cloNIDine (CATAPRES - DOSED IN MG/24 HR) 0.1 mg/24hr patch Place 1 patch (0.1 mg total) onto the skin once a week. 01/21/21   Caren Griffins, MD  morphine 1 mg/mL SOLN infusion Inject 1 mg/hr into the vein continuous. 01/19/21   Caren Griffins, MD  oxyCODONE 10 MG TABS Take 1 tablet (10 mg total) by mouth every 4 (four) hours as needed for severe pain. 12/27/20   [name], MD  polyethylene glycol (MIRALAX / GLYCOLAX) 17 g packet Take 17 g by  mouth daily as needed for mild constipation.    [provider]  sennosides-docusate sodium (SENOKOT-S) 8.6-50 MG tablet Take 1 tablet by mouth daily as needed for constipation.    [provider]  prochlorperazine (COMPAZINE) 10 MG tablet TAKE 1 TABLET(10 MG) BY MOUTH EVERY 6 HOURS AS NEEDED FOR NAUSEA OR VOMITING Patient taking differently: Take 10 mg by mouth every 6 (six) hours as needed for nausea or vomiting. 12/06/20 01/18/21  [name], MD    Allergies    Patient has no known allergies.  Review of Systems   Review of Systems  Unable to perform ROS: Patient nonverbal  Gastrointestinal:  Positive for nausea and vomiting.   Physical Exam Updated Vital Signs BP (!) 89/54 (BP Location: Right Arm)   Pulse (!) 172   Temp (!) 101.5 F (38.6 C) (Axillary)   Resp (!) 32   SpO2 94%   Physical Exam Vitals and nursing note reviewed.  Constitutional:      Comments: chronically ill  HENT:     Head: Normocephalic and atraumatic.     Mouth/Throat:     Mouth: Mucous membranes are dry.  Cardiovascular:     Rate and Rhythm: Tachycardia present.  Pulmonary:     Effort: Pulmonary effort is normal.     Breath sounds: Normal breath sounds.  Abdominal:     General: There is no distension.     Comments: Appears to be gagging every few seconds.   Skin:    General: Skin is warm.     Capillary Refill: Capillary refill takes less than 2 seconds.    ED Results / Procedures / Treatments   Labs (all labs ordered are listed, but only abnormal results are displayed) Labs Reviewed  CBG MONITORING, ED - Abnormal; Notable for the following components:      Result Value   Glucose-Capillary 112 (*)    All other components within normal limits    EKG None  Radiology DG Abd Portable 1 View  Result Date: 2021/02/11 CLINICAL DATA:  NG tube placement EXAM: PORTABLE ABDOMEN - 1 VIEW COMPARISON:  KUB 02/11/21 FINDINGS: The enteric catheter has been advanced, and the tip and  side hole now projects over the expected location of the stomach. There is mild gaseous distention of a loop of bowel in the left hemiabdomen, unchanged. There are patchy opacities in the lung bases, right worse than left. IMPRESSION: 1. Interval advancement of the NG tube with the tip and side hole now projecting over the expected location of the stomach. 2. Unchanged gaseous distention of loops of bowel in the left hemiabdomen. 3. Patchy opacities in the lung bases may reflect atelectasis or infection. Electronically Signed   By: Valetta Mole M.D.   On: 02/11/21 09:55   DG Abd Portable 1V  Result Date: February 11, 2021 CLINICAL DATA:  NG tube placement EXAM: PORTABLE ABDOMEN - 1 VIEW COMPARISON:  None. FINDINGS: NG tube tip is in  the distal esophagus. A single prominent air-filled loop of large bowel is seen in the left hemiabdomen. No acute osseous abnormality. Heterogeneous opacities of the partially visualized lung bases, possibly due to atelectasis. IMPRESSION: NG tube tip is in the distal esophagus, recommend advancing approximately 10.0 cm. Electronically Signed   By: Yetta Glassman M.D.   On: 02/01/21 09:06    Procedures Procedures   Medications Ordered in ED Medications  morphine 2 MG/ML injection 2 mg (2 mg Intravenous Given 02-01-2021 0759)  sodium chloride 0.9 % bolus 500 mL (has no administration in time range)  acetaminophen (TYLENOL) suppository 650 mg (650 mg Rectal Given 01-Feb-2021 0800)    ED Course  I have reviewed the triage vital signs and the nursing notes.  Pertinent labs & imaging results that were available during my care of the patient were reviewed by me and considered in my medical decision making (see chart for details).    MDM Rules/Calculators/A&P                           Pt presenting for NG tube replacement. On exam, pt appears chronically ill. Appears to be gagging. Pt is febrile and tachycardic. This is likely due to continued decline/end of life, however will  tx with tylenol for comfort. Will attempt replacement NG for comfort per family's wishes. Discussed with daughter my concerns for continued decline and possible near end of life.   NG tube placed with gastric contents coming from the tube.  On reevaluation, patient is no longer gagging, however she is groaning.  Husband states this is her baseline.    X-ray shows NG tube at the end of the esophagus, recommends further advancement.  Repeat x-ray viewed and independently interpreted by me, shows improved placement of the NG tube.  Will plan for discharge back to hospice.  Final Clinical Impression(s) / ED Diagnoses Final diagnoses:  Encounter for nasogastric (NG) tube placement  End of life care    Rx / DC Orders ED Discharge Orders     None        Franchot Heidelberg, PA-C 02-01-21 1032    Horton, Alvin Critchley, DO 02-01-21 1406

## 2021-02-04 NOTE — ED Notes (Signed)
[facility] called for transport back to [facility]

## 2021-02-04 NOTE — ED Triage Notes (Signed)
Hospice patient from [facility] for NG tube placement, as it became displaced at the facility and the hospice nurses can't put it back in.

## 2021-02-04 NOTE — Discharge Instructions (Addendum)
Ms. Soyars was given 650 mg to tylenol and 2 mg of morphine while in the ER.

## 2021-02-04 NOTE — ED Notes (Signed)
NG Tube advanced per provider, tube securing device reinforced.  Family at bedside.

## 2021-02-04 DEATH — deceased

## 2021-03-02 ENCOUNTER — Encounter: Payer: Self-pay | Admitting: Hematology and Oncology

## 2021-04-17 ENCOUNTER — Ambulatory Visit: Payer: 59

## 2021-12-29 IMAGING — CT CT CHEST-ABD-PELV W/ CM
3 of 5 series · 14 of 36 positions shown, 16 images · IV contrast (OMNIPAQUE)
Comparison: PET-CT 05/30/2020.

CLINICAL DATA: Restaging uterine/cervical cancer.

EXAM:
CT CHEST, ABDOMEN, AND PELVIS WITH CONTRAST
TECHNIQUE: Multidetector CT imaging of the chest, abdomen and pelvis was
performed following the standard protocol during bolus
administration of intravenous contrast.
CONTRAST:  100mL OMNIPAQUE IOHEXOL 300 MG/ML  SOLN

[Series 2: cap with · axial · 0.81mm/px · z∈[+1319,+1759]mm · 9 of 112 slices shown, 11 images]
[im 12/112  mediastinal]
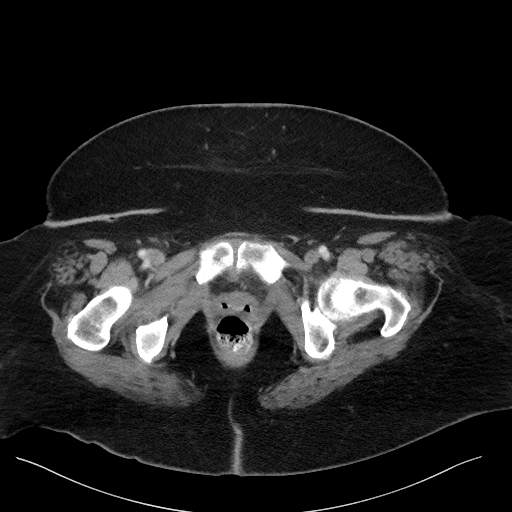
[im 12/112  bone]
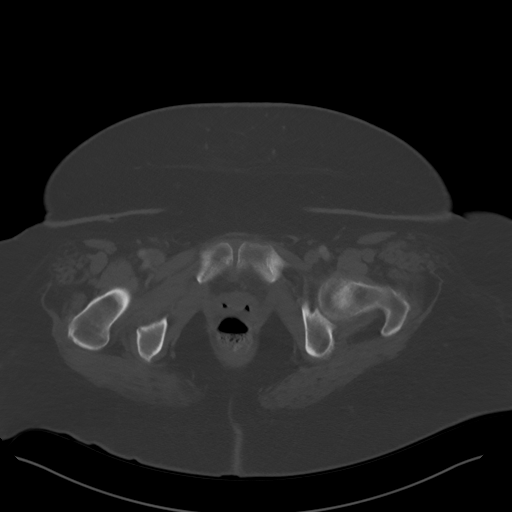
[im 23/112  mediastinal]
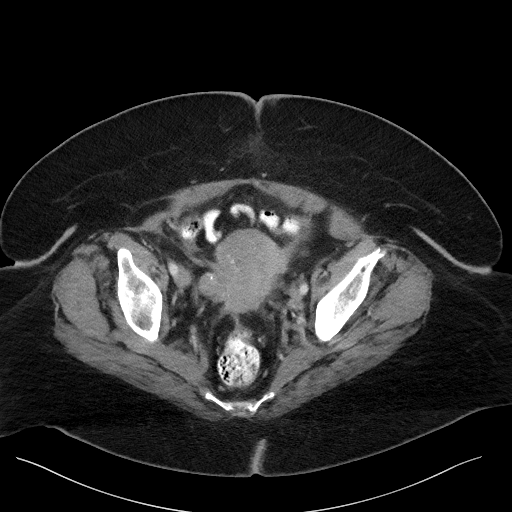
[im 34/112  mediastinal]
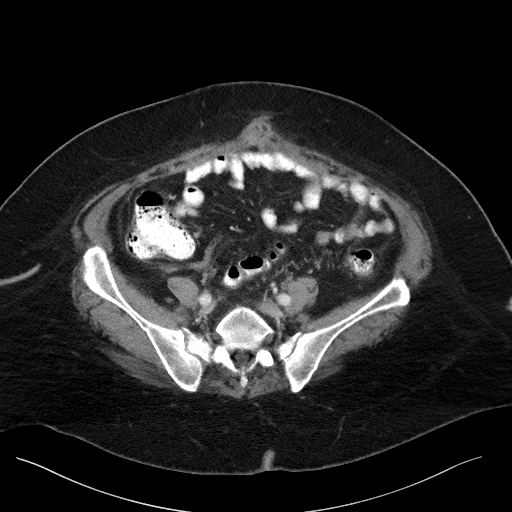
[im 45/112  mediastinal]
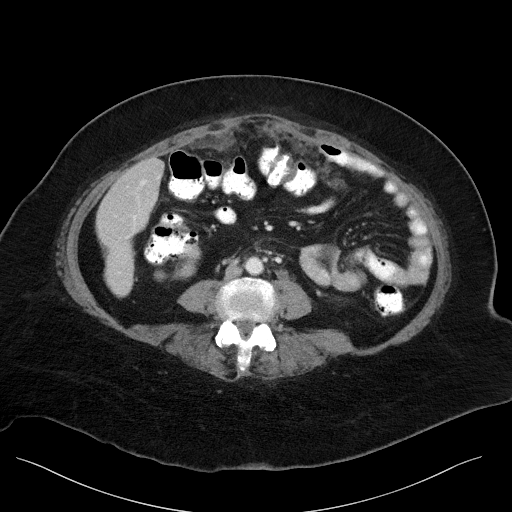
[im 56/112  mediastinal]
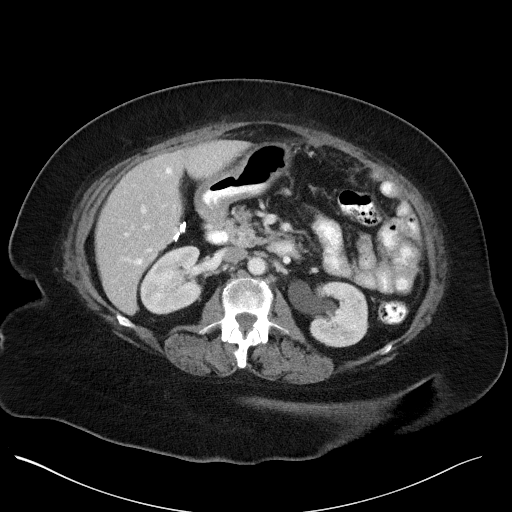
[im 67/112  mediastinal]
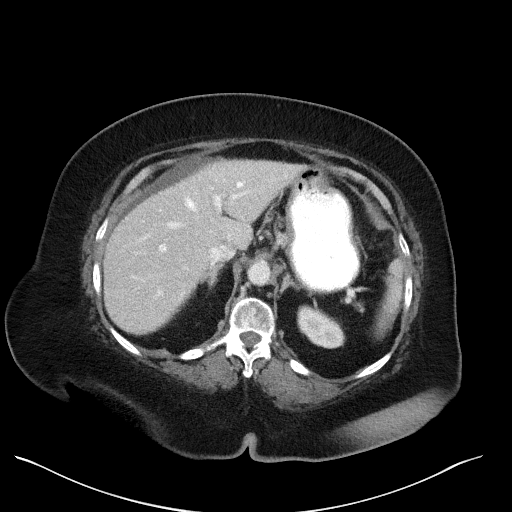
[im 78/112  mediastinal]
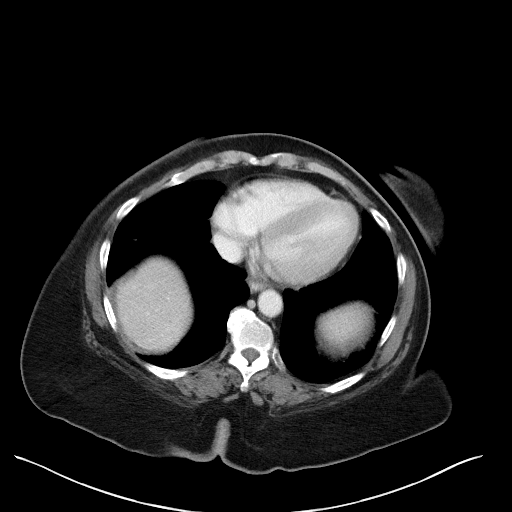
[im 89/112  mediastinal]
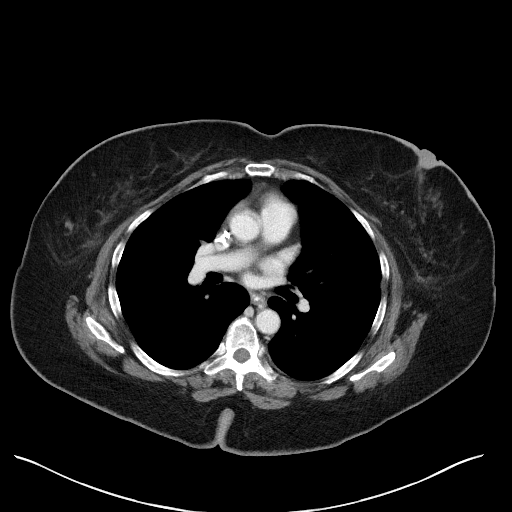
[im 100/112  mediastinal]
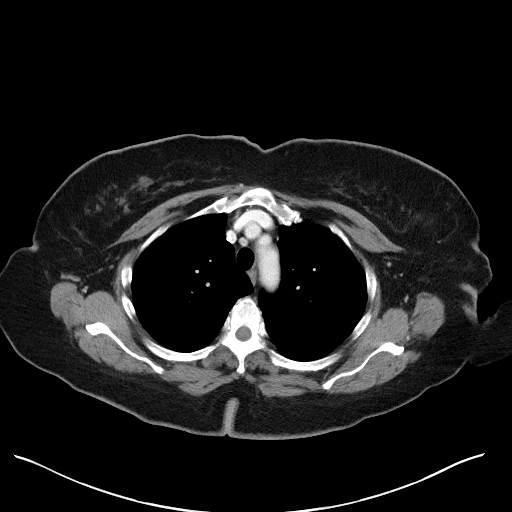
[im 100/112  bone]
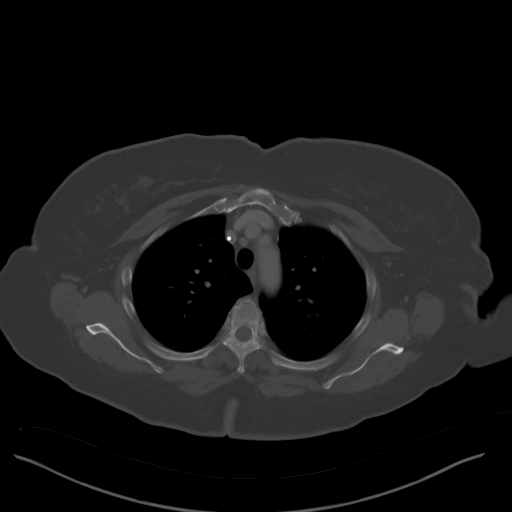

[Series 4: lung · axial · 0.81mm/px · z∈[+1603,+1625]mm · 2 of 119 slices shown]
[im 11/119  bone]
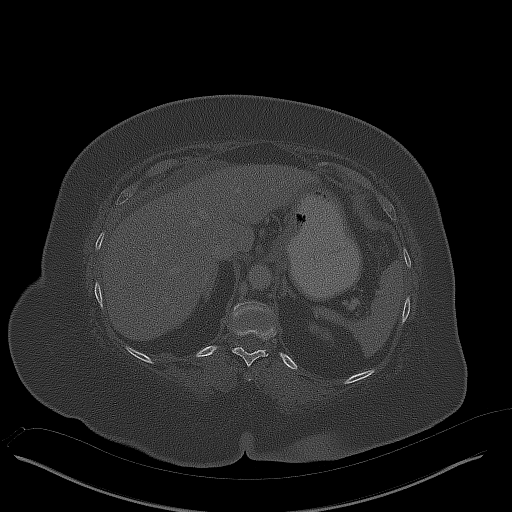
[im 22/119  bone]
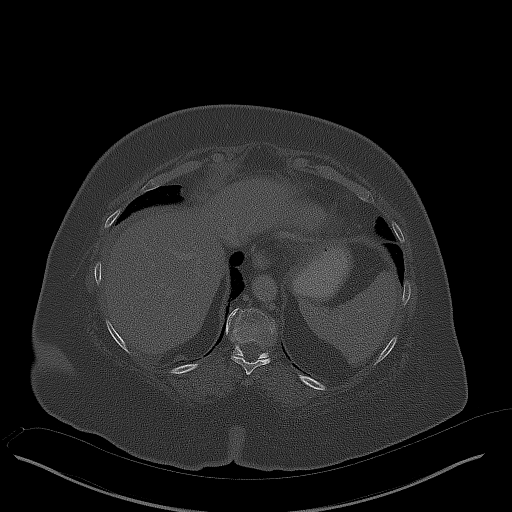

[Series 5: coronals · coronal · 0.82mm/px · 3 of 147 slices shown]
[im 30/147  mediastinal]
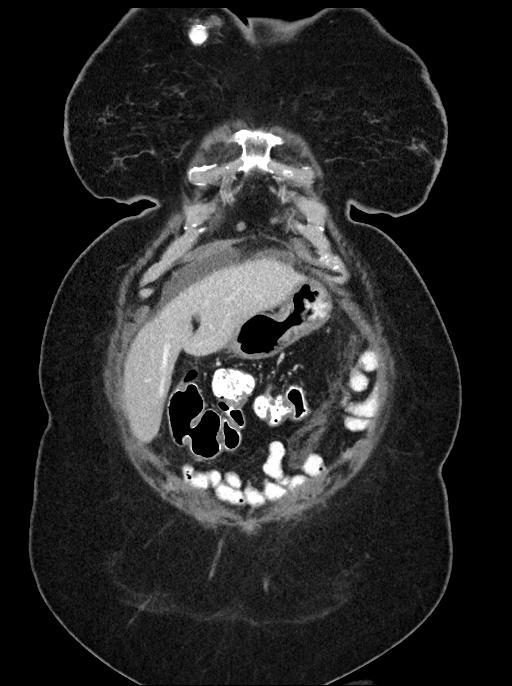
[im 59/147  mediastinal]
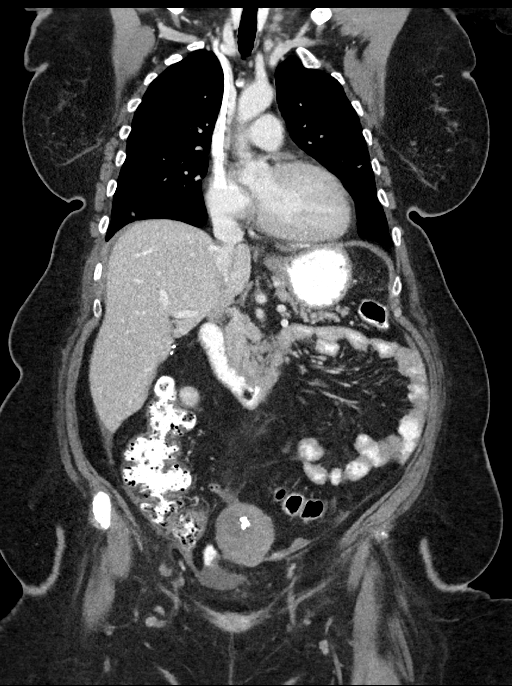
[im 88/147  mediastinal]
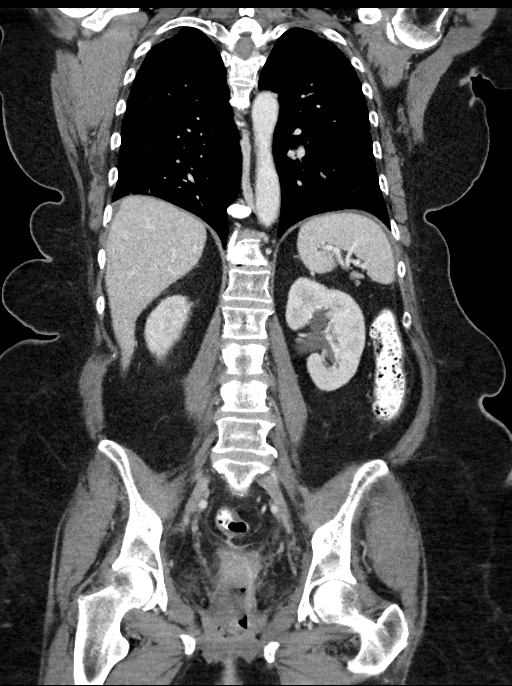

[14 of 36 positions shown; findings below may reference images not displayed]

FINDINGS: CT CHEST FINDINGS

Cardiovascular: The heart size appears within normal limits. No
pericardial effusion. Mild aortic atherosclerosis.

Mediastinum/Nodes: Normal appearance of the thyroid gland. The
trachea appears patent and is midline. Normal appearance of the
esophagus.

Stable 8 mm left supraclavicular lymph node, image [DATE].

The left pre-vascular lymph node in the superior mediastinum
measures 0.7 cm, image [DATE]. Previously 1 cm.

Low right paratracheal node at the carina measures 0.7 cm, image
[DATE]. Previously 1 cm.

Index right retrocrural lymph node measures 5 mm, image 44/2.
Previously 10 mm.

Lungs/Pleura: No pleural effusion.

Lingular nodule measures 0.7 cm, image 67/4.  Previously 1.2 cm.

Chronic, small nodular density within the medial right lung base is
unchanged and is favored to represent an area of chronic distal
airway impaction, image 93/4.

4 mm left upper lobe lung nodule is noted, image 40/4.  Stable.

3 mm left upper lobe lung nodule is also unchanged, image 50/4.

No new suspicious lung nodules.

Musculoskeletal: No chest wall mass or suspicious bone lesions
identified.

CT ABDOMEN PELVIS FINDINGS

Hepatobiliary: No suspicious liver lesions.

Pancreas: Normal appearance of the pancreas

Spleen: Spleen is unremarkable.

Adrenals/Urinary Tract: The adrenal glands appear within normal
limits. There is a suspicious lesion arising off the anterior cortex
of the left kidney which measures 2.6 cm, image 33/7. Previously
cm. Previously characterize as a solid enhancing lesion. This
remains suspicious for renal cell carcinoma.
Hemorrhagic/proteinaceous cyst arising from the inferior pole of the
right kidney is also noted measuring 1.5 cm, image 35/7. Unchanged
from previous exam. There are additional, subcentimeter low-density
kidney lesions noted bilaterally which are technically too small to
reliably characterize. Left-sided hydronephrosis and hydroureter is
again noted. No obstructing stone identified.

Stomach/Bowel: The stomach appears nondistended. The appendix is
visualized and is unremarkable. No bowel wall inflammation or bowel
distension identified.

Vascular/Lymphatic: Aortic atherosclerosis.  No aneurysm.

-Index left periaortic node measures 1.1 cm, image 59/2. Previously
1.5 cm.

-The left pelvic sidewall index lymph node measures 1.1 cm, image
94/2. Previously 1.3 cm.

Reproductive: Calcified uterine fibroids are identified. Similar
appearance of subserosal fibroid arising off the right lateral
myometrium measuring 1.9 cm, image 90/2. No adnexal mass.

Other: Near complete resolution of abdominopelvic ascites with small
volume of perisplenic and perihepatic ascites persisting.

The omental cake has decreased in thickness compared with the
previous exam.

-On the left this measures 1.2 cm, image 69/2.  Previously 2.0 cm.

-On the right this area measures 0.9 cm, image 69/2. Previously
cm.

Previous FDG avid tumor within the umbilicus is again noted
measuring 3.0 x 2.3 cm, image 81/2. Previously 4.0 x 2.8 cm.

Musculoskeletal: No suspicious lytic or sclerotic bone lesions.
IMPRESSION: 1. Interval response to therapy. There has been interval decrease in
size of mediastinal, retrocrural, retroperitoneal, and left pelvic
sidewall lymph nodes.
2. Decreased volume of ascites. Interval decrease in thickness of
omental cake secondary to peritoneal disease.
3. Decrease in size of FDG avid tumor within the umbilicus.
4. Decrease in size of left upper lobe lung nodule.
5. Persistent left-sided hydronephrosis and hydroureter. No
obstructing stone identified.
6. Previously noted solid enhancing lesion arising off the anterior
cortex of the inferior pole of the right kidney is again noted and
remains worrisome for renal cell carcinoma.
7. Aortic atherosclerosis.

Aortic Atherosclerosis ([id number]).

## 2022-04-08 IMAGING — CT CT CHEST-ABD-PELV W/ CM
2 of 5 series · 13 of 36 positions shown, 15 images · IV contrast (omnipaque)
Comparison: 07/25/2020

CLINICAL DATA: Evaluate treatment response. History of
uterine/cervical carcinoma.

EXAM:
CT CHEST, ABDOMEN, AND PELVIS WITH CONTRAST
TECHNIQUE: Multidetector CT imaging of the chest, abdomen and pelvis was
performed following the standard protocol during bolus
administration of intravenous contrast.
CONTRAST:  100mL OMNIPAQUE IOHEXOL 300 MG/ML  SOLN

[Series 2: cap with · axial · 0.92mm/px · z∈[+1143,+1613]mm · 10 of 114 slices shown, 12 images]
[im 10/114  mediastinal]
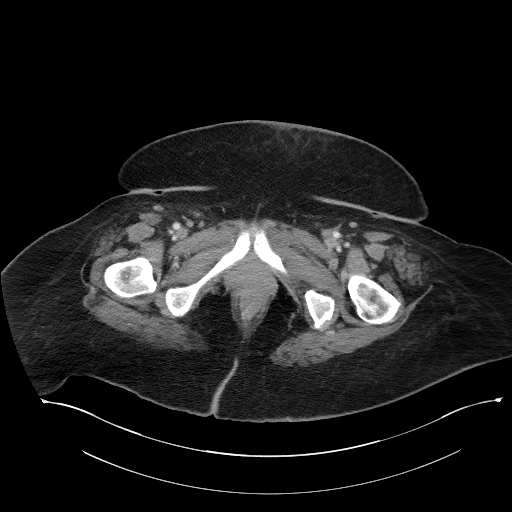
[im 10/114  bone]
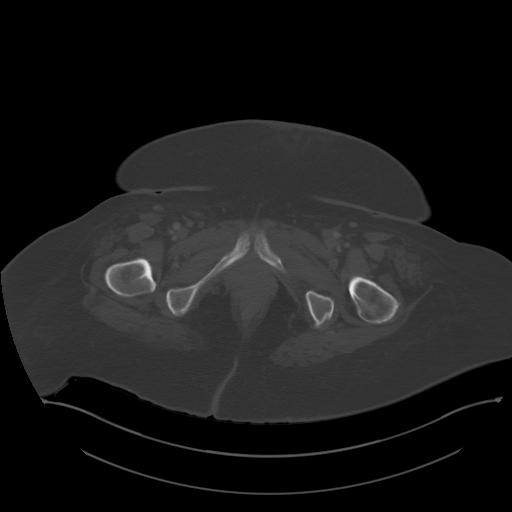
[im 19/114  mediastinal]
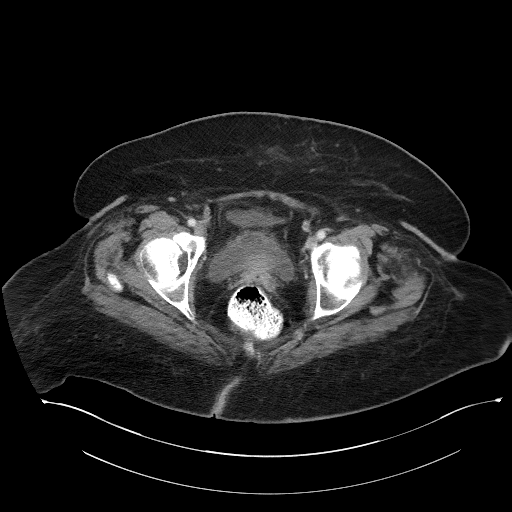
[im 29/114  mediastinal]
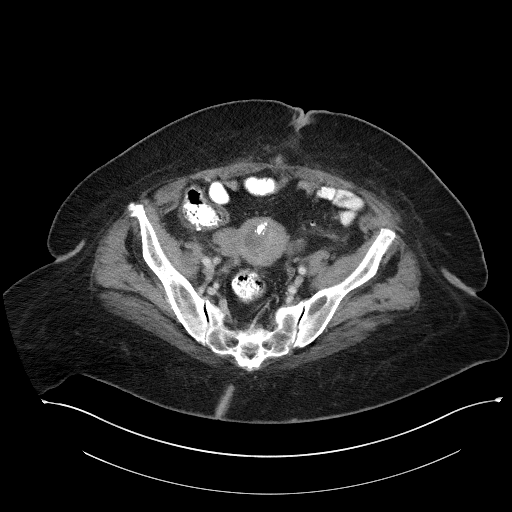
[im 38/114  mediastinal]
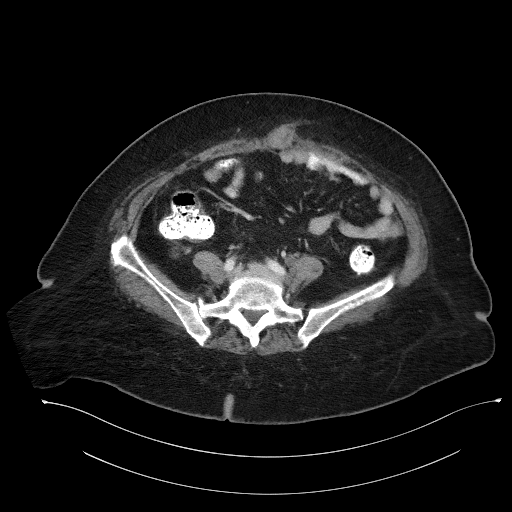
[im 48/114  mediastinal]
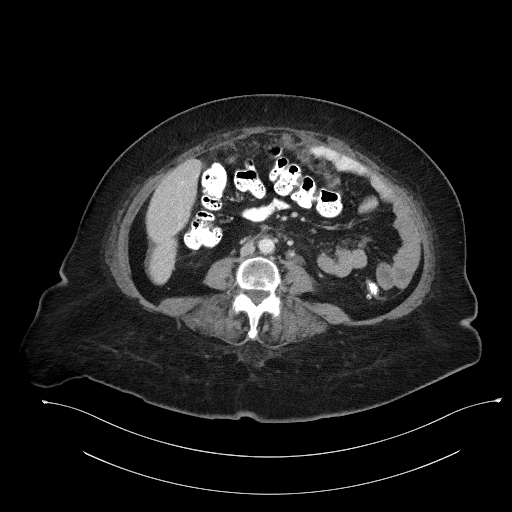
[im 66/114  mediastinal]
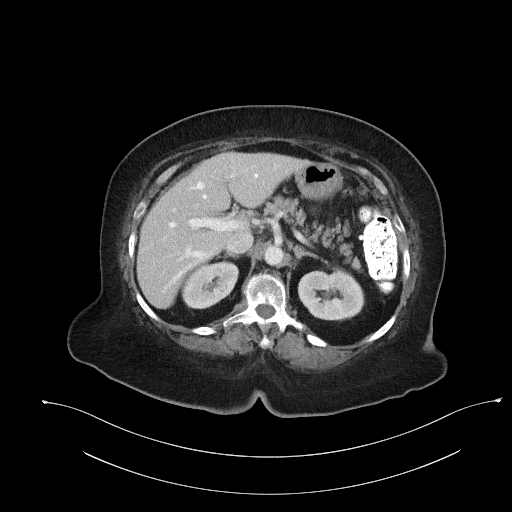
[im 76/114  mediastinal]
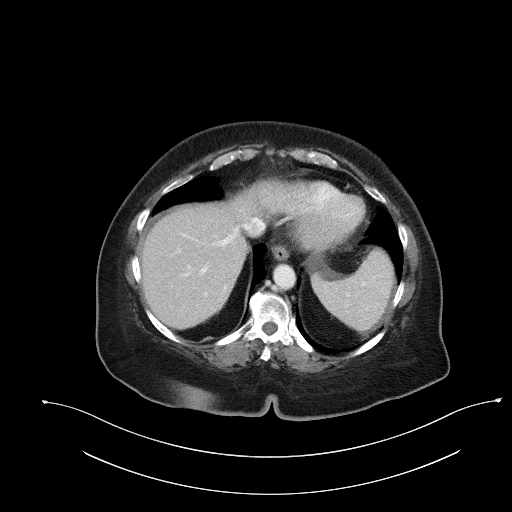
[im 85/114  mediastinal]
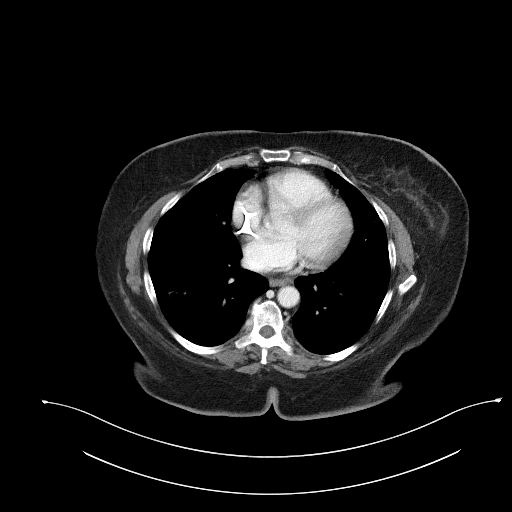
[im 95/114  mediastinal]
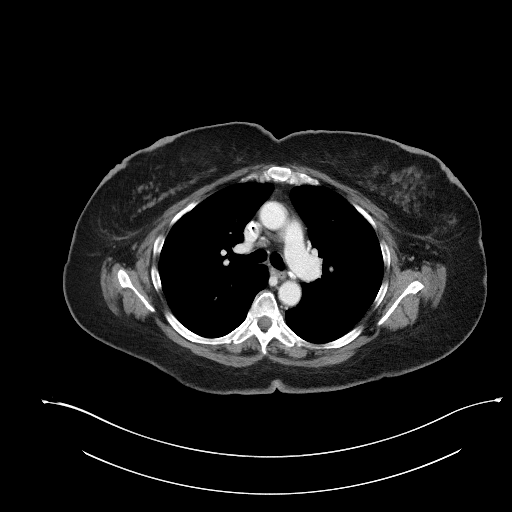
[im 95/114  bone]
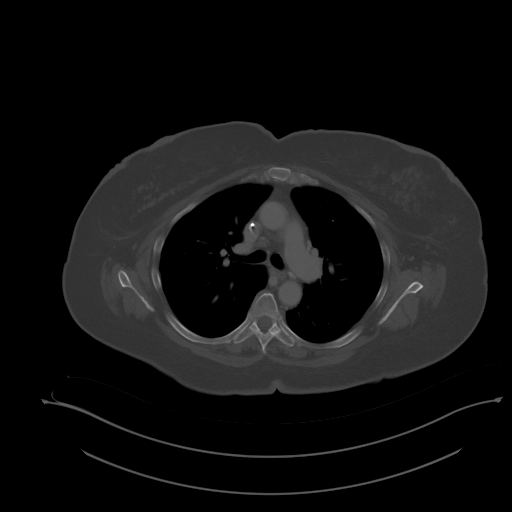
[im 104/114  mediastinal]
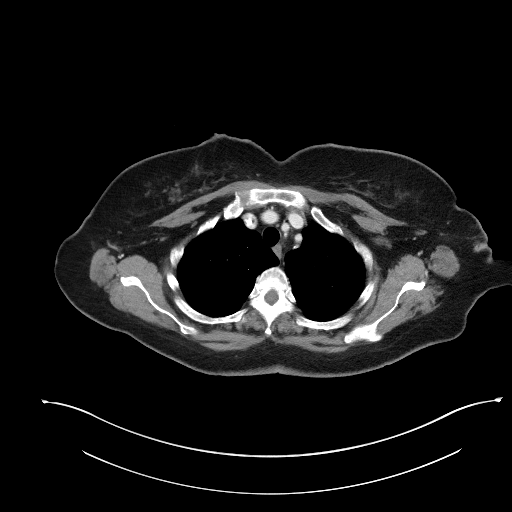

[Series 5: coronals · coronal · 0.82mm/px · 3 of 147 slices shown]
[im 30/147  mediastinal]
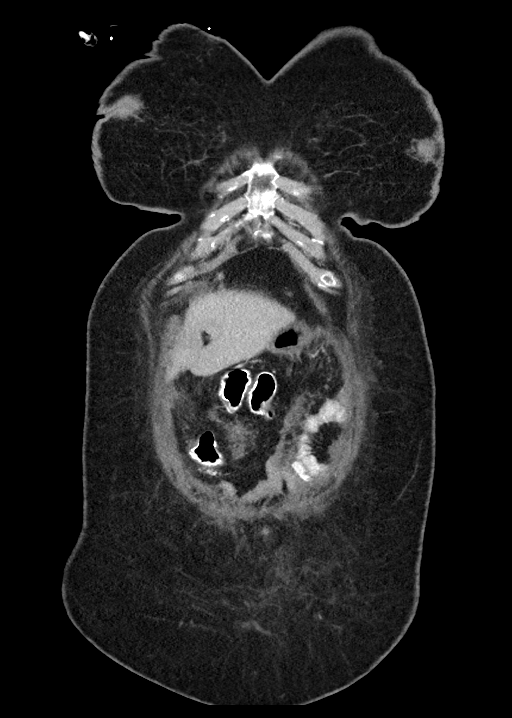
[im 59/147  mediastinal]
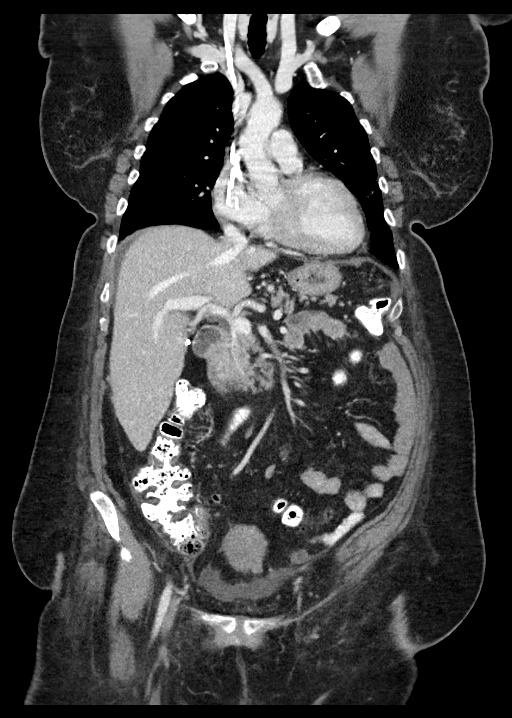
[im 88/147  mediastinal]
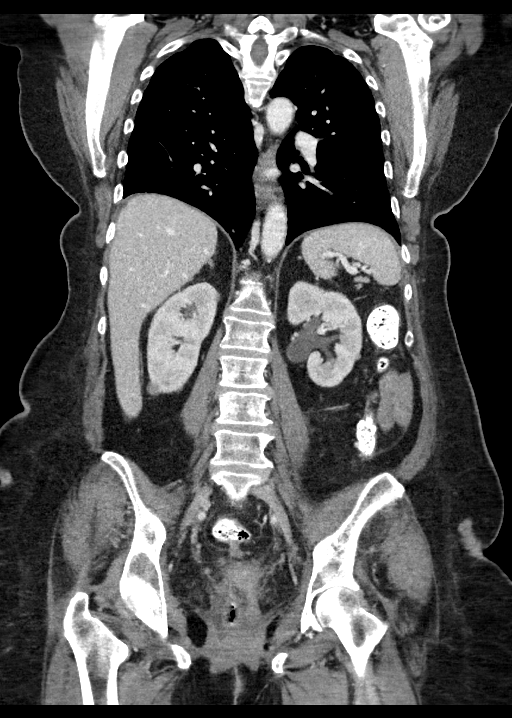

[13 of 36 positions shown; findings below may reference images not displayed]

FINDINGS: CT CHEST FINDINGS

Cardiovascular: The heart size appears within normal limits. No
pericardial effusion. Aortic atherosclerosis.

Mediastinum/Nodes: No enlarged mediastinal, hilar, or axillary lymph
nodes.

-index left supraclavicular node measures 6 mm, image [DATE]. Formally
8 mm.

Index left superior mediastinal node measures 0.5 cm, image [DATE].
Formally 0.7 cm.

Index low right paratracheal lymph node measures 0.7 cm, image [DATE].
Previously this measured the same.

Thyroid gland, trachea, and esophagus demonstrate no significant
findings.

Lungs/Pleura: No pleural effusion. No airspace consolidation,
atelectasis or pneumothorax.

-index nodule in the left upper lobe measures 4 mm, image 44/4.
Formally this measured the same.

-index nodule in the left upper lobe measures 2 mm, image 53/4.
Formally 3 mm.

-Index nodule within the medial right lung base measures 2 mm, image
90/4. Formally 4 mm.

-Right middle lobe lung nodule measures 2 mm, image 91/4. Unchanged.

Musculoskeletal: Focal area of sclerosis involving the T1 vertebral
body extending into the right pedicle is unchanged, image [DATE]. No
acute or suspicious osseous findings.

CT ABDOMEN PELVIS FINDINGS

Hepatobiliary: No suspicious liver lesion. Status post
cholecystectomy. No bile duct dilatation.

Pancreas: Unremarkable. No pancreatic ductal dilatation or
surrounding inflammatory changes.

Spleen: Normal in size without focal abnormality.

Adrenals/Urinary Tract: Normal adrenal glands. Solid enhancing
lesion arising off the anterior cortex of the inferior pole of right
kidney measures 2.2 cm and is unchanged from previous exam, image
63/2. Small scattered renal hypodensities are also noted most of
these are technically too small to reliably characterize.
Indeterminate hyperdense lesion arising off the inferior pole of the
right kidney measures 1.4 cm and 69 Hounsfield units. Unchanged from
previous exam. No right hydronephrosis. Mild left hydronephrosis,
similar. There is left hydroureter to just before the urinary
bladder. No obstructing stone identified.

Stomach/Bowel: Stomach is nondistended. No signs of bowel
obstruction.

Vascular/Lymphatic: Aortic atherosclerosis.

-Index left periaortic lymph node is stable measuring 1.1 cm, image
60/2.

-Index left pelvic sidewall lymph node measures 1 cm, image 95/2.
Formally 1.1 cm.

Reproductive:

Similar appearance of the uterus and adnexal structures.

Other: Perihepatic fluid (which may be loculated) overlying the
anterior and dome of right hepatic lobe is slightly decreased in
volume from previous exam. This measures approximately 4 mm in
thickness versus 9 mm previously.

Similar appearance of the omental cake:

On the left this measures 1.2 cm in thickness, image 69/2.
Unchanged.

On the right this measures 8 mm in thickness, image 70/2. Also
unchanged. Peritoneal nodularity within the left upper quadrant of
the abdomen is similar to the previous exam, image 46/2.

Previous FDG avid tumor within the umbilicus which appears
contiguous with the omental cake measures 2.2 x 2.0 cm, image 79/2.
Formally 3.0 x 2.3 cm.

Musculoskeletal: No acute or significant osseous findings.
IMPRESSION: 1. Overall no significant interval change in exam.
2. Signs of peritoneal carcinomatosis are again noted with
perihepatic ascites and omental caking. Not significantly changed in
the interval.
3. Slight decrease in size left supraclavicular and mediastinal
lymph nodes. Stable borderline retroperitoneal and left pelvic lymph
nodes.
4. Small pulmonary nodules are stable.
5. Stable appearance of solid enhancing lesion arising off the
anterior cortex of the inferior pole of right kidney compatible with
renal cell carcinoma.
6. Stable appearance of mild left hydronephrosis and left
hydroureter to just before the urinary bladder. No obstructing stone
identified.
7. Aortic atherosclerosis.

Aortic Atherosclerosis ([id number]).
